# Patient Record
Sex: Male | Born: 1935 | Race: White | Hispanic: No | Smoking: Never smoker
Health system: Southern US, Community
[De-identification: ages and names within clinical notes are randomized; demographics above are authoritative.]

## PROBLEM LIST (undated history)

## (undated) DIAGNOSIS — I10 Essential (primary) hypertension: Secondary | ICD-10-CM

---

## 2005-04-18 ENCOUNTER — Emergency Department: Payer: Self-pay | Admitting: Emergency Medicine

## 2006-06-17 DIAGNOSIS — C4491 Basal cell carcinoma of skin, unspecified: Secondary | ICD-10-CM

## 2006-06-17 HISTORY — DX: Basal cell carcinoma of skin, unspecified: C44.91

## 2008-04-23 DIAGNOSIS — L570 Actinic keratosis: Secondary | ICD-10-CM

## 2008-04-23 HISTORY — DX: Actinic keratosis: L57.0

## 2010-02-17 ENCOUNTER — Ambulatory Visit: Payer: Self-pay | Admitting: Internal Medicine

## 2010-02-27 ENCOUNTER — Ambulatory Visit: Payer: Self-pay | Admitting: Internal Medicine

## 2010-03-06 ENCOUNTER — Ambulatory Visit: Payer: Self-pay | Admitting: Internal Medicine

## 2010-04-06 ENCOUNTER — Ambulatory Visit: Payer: Self-pay | Admitting: Internal Medicine

## 2010-07-04 ENCOUNTER — Ambulatory Visit: Payer: Self-pay | Admitting: Internal Medicine

## 2010-07-06 ENCOUNTER — Ambulatory Visit: Payer: Self-pay | Admitting: Internal Medicine

## 2010-10-03 ENCOUNTER — Ambulatory Visit: Payer: Self-pay | Admitting: Internal Medicine

## 2010-10-06 ENCOUNTER — Ambulatory Visit: Payer: Self-pay | Admitting: Internal Medicine

## 2010-12-19 ENCOUNTER — Ambulatory Visit: Payer: Self-pay | Admitting: Internal Medicine

## 2011-01-06 ENCOUNTER — Ambulatory Visit: Payer: Self-pay | Admitting: Internal Medicine

## 2011-01-14 LAB — CBC CANCER CENTER
Basophil #: 0.1 x10 3/mm (ref 0.0–0.1)
Basophil %: 0.7 %
Eosinophil #: 0.4 x10 3/mm (ref 0.0–0.7)
HGB: 17.1 g/dL (ref 13.0–18.0)
Lymphocyte %: 15.9 %
MCH: 30.3 pg (ref 26.0–34.0)
MCHC: 34.1 g/dL (ref 32.0–36.0)
Monocyte #: 1 x10 3/mm — ABNORMAL HIGH (ref 0.0–0.7)
Neutrophil %: 68.7 %
Platelet: 121 x10 3/mm — ABNORMAL LOW (ref 150–440)
RDW: 16 % — ABNORMAL HIGH (ref 11.5–14.5)

## 2011-02-04 LAB — CBC CANCER CENTER
Basophil %: 0.1 %
Eosinophil #: 0.4 x10 3/mm (ref 0.0–0.7)
Eosinophil %: 2.6 %
HCT: 43.9 % (ref 40.0–52.0)
HGB: 15.6 g/dL (ref 13.0–18.0)
Lymphocyte #: 1.9 x10 3/mm (ref 1.0–3.6)
Lymphocyte %: 14.1 %
MCH: 31.1 pg (ref 26.0–34.0)
MCV: 87 fL (ref 80–100)
Neutrophil %: 77.8 %
RBC: 5.03 10*6/uL (ref 4.40–5.90)
RDW: 15.9 % — ABNORMAL HIGH (ref 11.5–14.5)

## 2011-02-06 ENCOUNTER — Ambulatory Visit: Payer: Self-pay | Admitting: Internal Medicine

## 2011-03-04 LAB — CBC CANCER CENTER
Basophil #: 0.1 x10 3/mm (ref 0.0–0.1)
Basophil %: 0.7 %
Eosinophil #: 0.3 x10 3/mm (ref 0.0–0.7)
HCT: 41.6 % (ref 40.0–52.0)
HGB: 14.6 g/dL (ref 13.0–18.0)
Lymphocyte #: 2.2 x10 3/mm (ref 1.0–3.6)
Lymphocyte %: 20.9 %
MCH: 31.2 pg (ref 26.0–34.0)
MCHC: 35 g/dL (ref 32.0–36.0)
MCV: 89 fL (ref 80–100)
Monocyte #: 0.8 x10 3/mm — ABNORMAL HIGH (ref 0.0–0.7)
Monocyte %: 7.1 %
Neutrophil #: 7.3 x10 3/mm — ABNORMAL HIGH (ref 1.4–6.5)
Neutrophil %: 68.6 %
Platelet: 145 x10 3/mm — ABNORMAL LOW (ref 150–440)
RDW: 16.6 % — ABNORMAL HIGH (ref 11.5–14.5)
WBC: 10.7 x10 3/mm — ABNORMAL HIGH (ref 3.8–10.6)

## 2011-03-06 ENCOUNTER — Ambulatory Visit: Payer: Self-pay | Admitting: Internal Medicine

## 2011-04-29 ENCOUNTER — Ambulatory Visit: Payer: Self-pay | Admitting: Internal Medicine

## 2011-04-29 LAB — CBC CANCER CENTER
Basophil #: 0.1 x10 3/mm (ref 0.0–0.1)
HGB: 13.8 g/dL (ref 13.0–18.0)
Lymphocyte %: 22.4 %
MCH: 31.6 pg (ref 26.0–34.0)
MCV: 90 fL (ref 80–100)
Monocyte %: 5.8 %
Platelet: 142 x10 3/mm — ABNORMAL LOW (ref 150–440)
RBC: 4.37 10*6/uL — ABNORMAL LOW (ref 4.40–5.90)
RDW: 14.5 % (ref 11.5–14.5)
WBC: 11 x10 3/mm — ABNORMAL HIGH (ref 3.8–10.6)

## 2011-05-06 ENCOUNTER — Ambulatory Visit: Payer: Self-pay | Admitting: Internal Medicine

## 2011-07-29 ENCOUNTER — Ambulatory Visit: Payer: Self-pay | Admitting: Internal Medicine

## 2011-07-29 LAB — CBC CANCER CENTER
Basophil #: 0.1 x10 3/mm (ref 0.0–0.1)
Basophil %: 0.8 %
Eosinophil %: 2.8 %
HGB: 13.7 g/dL (ref 13.0–18.0)
Lymphocyte #: 2.7 x10 3/mm (ref 1.0–3.6)
Lymphocyte %: 23.1 %
MCH: 30.6 pg (ref 26.0–34.0)
MCV: 89 fL (ref 80–100)
Monocyte #: 0.8 x10 3/mm (ref 0.2–1.0)
Monocyte %: 7.2 %
Platelet: 141 x10 3/mm — ABNORMAL LOW (ref 150–440)
RBC: 4.47 10*6/uL (ref 4.40–5.90)

## 2011-08-06 ENCOUNTER — Ambulatory Visit: Payer: Self-pay | Admitting: Internal Medicine

## 2011-08-19 LAB — CBC CANCER CENTER
Basophil %: 0.9 %
Eosinophil %: 3.1 %
HCT: 40.2 % (ref 40.0–52.0)
HGB: 14.1 g/dL (ref 13.0–18.0)
MCH: 30.8 pg (ref 26.0–34.0)
MCHC: 34.9 g/dL (ref 32.0–36.0)
MCV: 88 fL (ref 80–100)
Monocyte #: 0.6 x10 3/mm (ref 0.2–1.0)
Monocyte %: 5.7 %
Neutrophil #: 7.1 x10 3/mm — ABNORMAL HIGH (ref 1.4–6.5)
Neutrophil %: 69.8 %

## 2011-08-19 LAB — OCCULT BLOOD X 1 CARD TO LAB, STOOL
Occult Blood, Feces: NEGATIVE
Occult Blood, Feces: NEGATIVE
Occult Blood, Feces: NEGATIVE

## 2011-08-19 LAB — IRON AND TIBC
Iron Bind.Cap.(Total): 357 ug/dL (ref 250–450)
Iron Saturation: 29 %
Iron: 103 ug/dL (ref 65–175)

## 2011-08-19 LAB — FERRITIN: Ferritin (ARMC): 410 ng/mL — ABNORMAL HIGH (ref 8–388)

## 2011-09-06 ENCOUNTER — Ambulatory Visit: Payer: Self-pay | Admitting: Internal Medicine

## 2011-10-28 ENCOUNTER — Ambulatory Visit: Payer: Self-pay | Admitting: Internal Medicine

## 2011-10-28 LAB — CBC CANCER CENTER
Basophil %: 1.1 %
Eosinophil #: 0.4 x10 3/mm (ref 0.0–0.7)
HCT: 39.1 % — ABNORMAL LOW (ref 40.0–52.0)
HGB: 13.3 g/dL (ref 13.0–18.0)
Lymphocyte %: 22.9 %
MCH: 30.6 pg (ref 26.0–34.0)
MCHC: 34 g/dL (ref 32.0–36.0)
Monocyte #: 0.6 x10 3/mm (ref 0.2–1.0)
Neutrophil #: 7.5 x10 3/mm — ABNORMAL HIGH (ref 1.4–6.5)
Neutrophil %: 66.8 %
Platelet: 137 x10 3/mm — ABNORMAL LOW (ref 150–440)
RDW: 15 % — ABNORMAL HIGH (ref 11.5–14.5)
WBC: 11.2 x10 3/mm — ABNORMAL HIGH (ref 3.8–10.6)

## 2011-11-06 ENCOUNTER — Ambulatory Visit: Payer: Self-pay | Admitting: Internal Medicine

## 2012-01-06 ENCOUNTER — Ambulatory Visit: Payer: Self-pay | Admitting: Internal Medicine

## 2012-01-27 LAB — CBC CANCER CENTER
Eosinophil %: 2.9 %
HCT: 39.1 % — ABNORMAL LOW (ref 40.0–52.0)
HGB: 13.9 g/dL (ref 13.0–18.0)
Lymphocyte #: 2.8 x10 3/mm (ref 1.0–3.6)
Lymphocyte %: 22.5 %
MCHC: 35.6 g/dL (ref 32.0–36.0)
Monocyte #: 0.7 x10 3/mm (ref 0.2–1.0)
Monocyte %: 5.5 %
Neutrophil #: 8.5 x10 3/mm — ABNORMAL HIGH (ref 1.4–6.5)
Neutrophil %: 68.1 %
Platelet: 151 x10 3/mm (ref 150–440)
RBC: 4.61 10*6/uL (ref 4.40–5.90)
RDW: 14.7 % — ABNORMAL HIGH (ref 11.5–14.5)

## 2012-02-06 ENCOUNTER — Ambulatory Visit: Payer: Self-pay | Admitting: Internal Medicine

## 2012-05-24 ENCOUNTER — Ambulatory Visit: Payer: Self-pay | Admitting: Internal Medicine

## 2012-05-25 LAB — CBC CANCER CENTER
Basophil #: 0.1 x10 3/mm (ref 0.0–0.1)
Basophil %: 1.1 %
Eosinophil %: 3.3 %
HCT: 35.3 % — ABNORMAL LOW (ref 40.0–52.0)
HGB: 12.7 g/dL — ABNORMAL LOW (ref 13.0–18.0)
Lymphocyte #: 2.6 x10 3/mm (ref 1.0–3.6)
MCV: 83 fL (ref 80–100)
Monocyte #: 0.6 x10 3/mm (ref 0.2–1.0)
Monocyte %: 6.5 %
Neutrophil #: 6.3 x10 3/mm (ref 1.4–6.5)
RBC: 4.28 10*6/uL — ABNORMAL LOW (ref 4.40–5.90)
RDW: 15 % — ABNORMAL HIGH (ref 11.5–14.5)
WBC: 9.9 x10 3/mm (ref 3.8–10.6)

## 2012-06-05 ENCOUNTER — Ambulatory Visit: Payer: Self-pay | Admitting: Internal Medicine

## 2013-02-09 ENCOUNTER — Ambulatory Visit: Payer: Self-pay | Admitting: Internal Medicine

## 2013-02-10 LAB — CBC CANCER CENTER
Basophil #: 0.1 x10 3/mm (ref 0.0–0.1)
Basophil %: 1.1 %
EOS ABS: 0.5 x10 3/mm (ref 0.0–0.7)
Eosinophil %: 4.8 %
HCT: 37.4 % — ABNORMAL LOW (ref 40.0–52.0)
HGB: 13 g/dL (ref 13.0–18.0)
LYMPHS ABS: 2.7 x10 3/mm (ref 1.0–3.6)
Lymphocyte %: 26.7 %
MCH: 29.3 pg (ref 26.0–34.0)
MCHC: 34.7 g/dL (ref 32.0–36.0)
MCV: 85 fL (ref 80–100)
Monocyte #: 0.6 x10 3/mm (ref 0.2–1.0)
Monocyte %: 6.2 %
Neutrophil #: 6.1 x10 3/mm (ref 1.4–6.5)
Neutrophil %: 61.2 %
Platelet: 143 x10 3/mm — ABNORMAL LOW (ref 150–440)
RBC: 4.42 10*6/uL (ref 4.40–5.90)
RDW: 15.1 % — ABNORMAL HIGH (ref 11.5–14.5)
WBC: 9.9 x10 3/mm (ref 3.8–10.6)

## 2013-03-05 ENCOUNTER — Ambulatory Visit: Payer: Self-pay | Admitting: Internal Medicine

## 2013-03-10 LAB — CBC CANCER CENTER
Basophil #: 0.1 x10 3/mm (ref 0.0–0.1)
Basophil %: 0.9 %
Eosinophil #: 0.4 x10 3/mm (ref 0.0–0.7)
Eosinophil %: 3.8 %
HCT: 38.7 % — AB (ref 40.0–52.0)
HGB: 13.2 g/dL (ref 13.0–18.0)
LYMPHS PCT: 23.6 %
Lymphocyte #: 2.4 x10 3/mm (ref 1.0–3.6)
MCH: 29.2 pg (ref 26.0–34.0)
MCHC: 34 g/dL (ref 32.0–36.0)
MCV: 86 fL (ref 80–100)
MONO ABS: 0.6 x10 3/mm (ref 0.2–1.0)
MONOS PCT: 5.5 %
Neutrophil #: 6.8 x10 3/mm — ABNORMAL HIGH (ref 1.4–6.5)
Neutrophil %: 66.2 %
Platelet: 148 x10 3/mm — ABNORMAL LOW (ref 150–440)
RBC: 4.51 10*6/uL (ref 4.40–5.90)
RDW: 15 % — ABNORMAL HIGH (ref 11.5–14.5)
WBC: 10.3 x10 3/mm (ref 3.8–10.6)

## 2013-03-10 LAB — IRON AND TIBC
IRON: 87 ug/dL (ref 65–175)
Iron Bind.Cap.(Total): 369 ug/dL (ref 250–450)
Iron Saturation: 24 %
UNBOUND IRON-BIND. CAP.: 282 ug/dL

## 2013-03-10 LAB — FERRITIN: Ferritin (ARMC): 201 ng/mL (ref 8–388)

## 2013-04-05 ENCOUNTER — Ambulatory Visit: Payer: Self-pay | Admitting: Internal Medicine

## 2013-06-08 ENCOUNTER — Ambulatory Visit: Payer: Self-pay | Admitting: Internal Medicine

## 2013-06-09 LAB — CBC CANCER CENTER
BASOS ABS: 0.1 x10 3/mm (ref 0.0–0.1)
Basophil %: 0.9 %
EOS ABS: 0.4 x10 3/mm (ref 0.0–0.7)
Eosinophil %: 3.6 %
HCT: 36.8 % — AB (ref 40.0–52.0)
HGB: 12.8 g/dL — ABNORMAL LOW (ref 13.0–18.0)
LYMPHS ABS: 2.8 x10 3/mm (ref 1.0–3.6)
Lymphocyte %: 27.4 %
MCH: 29.8 pg (ref 26.0–34.0)
MCHC: 34.8 g/dL (ref 32.0–36.0)
MCV: 86 fL (ref 80–100)
MONOS PCT: 6.5 %
Monocyte #: 0.7 x10 3/mm (ref 0.2–1.0)
NEUTROS PCT: 61.6 %
Neutrophil #: 6.4 x10 3/mm (ref 1.4–6.5)
Platelet: 129 x10 3/mm — ABNORMAL LOW (ref 150–440)
RBC: 4.3 10*6/uL — ABNORMAL LOW (ref 4.40–5.90)
RDW: 14.7 % — AB (ref 11.5–14.5)
WBC: 10.3 x10 3/mm (ref 3.8–10.6)

## 2013-06-15 DIAGNOSIS — I10 Essential (primary) hypertension: Secondary | ICD-10-CM | POA: Insufficient documentation

## 2013-07-05 ENCOUNTER — Ambulatory Visit: Payer: Self-pay | Admitting: Internal Medicine

## 2013-10-06 ENCOUNTER — Ambulatory Visit: Payer: Self-pay | Admitting: Internal Medicine

## 2013-10-06 LAB — CBC CANCER CENTER
Basophil #: 0.1 x10 3/mm (ref 0.0–0.1)
Basophil %: 1.2 %
EOS ABS: 0.4 x10 3/mm (ref 0.0–0.7)
Eosinophil %: 4 %
HCT: 38.8 % — ABNORMAL LOW (ref 40.0–52.0)
HGB: 12.8 g/dL — ABNORMAL LOW (ref 13.0–18.0)
Lymphocyte #: 2.5 x10 3/mm (ref 1.0–3.6)
Lymphocyte %: 24 %
MCH: 29 pg (ref 26.0–34.0)
MCHC: 33.1 g/dL (ref 32.0–36.0)
MCV: 88 fL (ref 80–100)
MONOS PCT: 6.1 %
Monocyte #: 0.6 x10 3/mm (ref 0.2–1.0)
Neutrophil #: 6.7 x10 3/mm — ABNORMAL HIGH (ref 1.4–6.5)
Neutrophil %: 64.7 %
PLATELETS: 145 x10 3/mm — AB (ref 150–440)
RBC: 4.42 10*6/uL (ref 4.40–5.90)
RDW: 14.9 % — ABNORMAL HIGH (ref 11.5–14.5)
WBC: 10.3 x10 3/mm (ref 3.8–10.6)

## 2013-11-05 ENCOUNTER — Ambulatory Visit: Payer: Self-pay | Admitting: Internal Medicine

## 2014-02-09 ENCOUNTER — Ambulatory Visit: Payer: Self-pay | Admitting: Internal Medicine

## 2014-02-09 LAB — CBC CANCER CENTER
BASOS PCT: 1.3 %
Basophil #: 0.1 x10 3/mm (ref 0.0–0.1)
Eosinophil #: 0.3 x10 3/mm (ref 0.0–0.7)
Eosinophil %: 3.8 %
HCT: 38 % — ABNORMAL LOW (ref 40.0–52.0)
HGB: 13 g/dL (ref 13.0–18.0)
LYMPHS ABS: 2.3 x10 3/mm (ref 1.0–3.6)
Lymphocyte %: 24.7 %
MCH: 29.1 pg (ref 26.0–34.0)
MCHC: 34.3 g/dL (ref 32.0–36.0)
MCV: 85 fL (ref 80–100)
MONO ABS: 0.6 x10 3/mm (ref 0.2–1.0)
Monocyte %: 6.1 %
NEUTROS PCT: 64.1 %
Neutrophil #: 5.9 x10 3/mm (ref 1.4–6.5)
Platelet: 149 x10 3/mm — ABNORMAL LOW (ref 150–440)
RBC: 4.48 10*6/uL (ref 4.40–5.90)
RDW: 15.4 % — ABNORMAL HIGH (ref 11.5–14.5)
WBC: 9.3 x10 3/mm (ref 3.8–10.6)

## 2014-02-14 DIAGNOSIS — C4492 Squamous cell carcinoma of skin, unspecified: Secondary | ICD-10-CM

## 2014-02-14 HISTORY — DX: Squamous cell carcinoma of skin, unspecified: C44.92

## 2014-03-06 ENCOUNTER — Ambulatory Visit
Admit: 2014-03-06 | Disposition: A | Discharge status: Home / Self Care | Payer: Self-pay | Attending: Internal Medicine | Admitting: Internal Medicine

## 2014-06-08 ENCOUNTER — Other Ambulatory Visit: Payer: Self-pay

## 2014-06-15 DIAGNOSIS — E785 Hyperlipidemia, unspecified: Secondary | ICD-10-CM | POA: Insufficient documentation

## 2014-10-12 ENCOUNTER — Inpatient Hospital Stay: Payer: Medicare Other | Attending: Family Medicine

## 2015-02-08 ENCOUNTER — Inpatient Hospital Stay: Payer: Medicare Other

## 2015-02-08 ENCOUNTER — Ambulatory Visit: Payer: Self-pay | Admitting: Internal Medicine

## 2015-02-21 ENCOUNTER — Other Ambulatory Visit: Payer: Self-pay | Admitting: *Deleted

## 2015-02-21 DIAGNOSIS — D696 Thrombocytopenia, unspecified: Secondary | ICD-10-CM

## 2015-02-21 DIAGNOSIS — D72829 Elevated white blood cell count, unspecified: Secondary | ICD-10-CM

## 2015-02-22 ENCOUNTER — Inpatient Hospital Stay: Payer: Medicare Other

## 2015-02-22 ENCOUNTER — Inpatient Hospital Stay: Payer: Medicare Other | Attending: Internal Medicine

## 2015-02-28 ENCOUNTER — Telehealth: Payer: Self-pay | Admitting: *Deleted

## 2015-02-28 ENCOUNTER — Encounter: Payer: Self-pay | Admitting: *Deleted

## 2015-02-28 NOTE — Telephone Encounter (Signed)
Pt was last seen in feb 2016. Had lab appt at 4 months, 8 months and see md with labs in 12 months.  None of these appts did pt come to and he was r/s x 2 for his f/u appt.  He was previous gittin patient and was switched to covering provider after gittin left.  Sent letter closing chart to services today and put it in the mail to pt.

## 2018-11-22 ENCOUNTER — Emergency Department: Payer: Medicare Other

## 2018-11-22 ENCOUNTER — Inpatient Hospital Stay
Admission: EM | Admit: 2018-11-22 | Discharge: 2018-11-23 | DRG: 177 | Disposition: A | Discharge status: Other Acute Inpatient Hospital | Payer: Medicare Other | Attending: Family Medicine | Admitting: Family Medicine

## 2018-11-22 ENCOUNTER — Encounter: Payer: Self-pay | Admitting: Emergency Medicine

## 2018-11-22 ENCOUNTER — Other Ambulatory Visit: Payer: Self-pay

## 2018-11-22 DIAGNOSIS — I959 Hypotension, unspecified: Secondary | ICD-10-CM | POA: Diagnosis present

## 2018-11-22 DIAGNOSIS — U071 COVID-19: Secondary | ICD-10-CM | POA: Diagnosis not present

## 2018-11-22 DIAGNOSIS — N179 Acute kidney failure, unspecified: Secondary | ICD-10-CM | POA: Diagnosis present

## 2018-11-22 DIAGNOSIS — Z7982 Long term (current) use of aspirin: Secondary | ICD-10-CM

## 2018-11-22 DIAGNOSIS — J189 Pneumonia, unspecified organism: Secondary | ICD-10-CM | POA: Diagnosis not present

## 2018-11-22 DIAGNOSIS — R531 Weakness: Secondary | ICD-10-CM | POA: Diagnosis not present

## 2018-11-22 DIAGNOSIS — E860 Dehydration: Secondary | ICD-10-CM | POA: Diagnosis present

## 2018-11-22 DIAGNOSIS — J1282 Pneumonia due to coronavirus disease 2019: Secondary | ICD-10-CM | POA: Diagnosis present

## 2018-11-22 DIAGNOSIS — H9190 Unspecified hearing loss, unspecified ear: Secondary | ICD-10-CM | POA: Diagnosis present

## 2018-11-22 DIAGNOSIS — I10 Essential (primary) hypertension: Secondary | ICD-10-CM | POA: Diagnosis present

## 2018-11-22 DIAGNOSIS — I447 Left bundle-branch block, unspecified: Secondary | ICD-10-CM | POA: Diagnosis present

## 2018-11-22 DIAGNOSIS — R7989 Other specified abnormal findings of blood chemistry: Secondary | ICD-10-CM | POA: Diagnosis present

## 2018-11-22 DIAGNOSIS — E785 Hyperlipidemia, unspecified: Secondary | ICD-10-CM | POA: Diagnosis present

## 2018-11-22 DIAGNOSIS — Z79899 Other long term (current) drug therapy: Secondary | ICD-10-CM

## 2018-11-22 DIAGNOSIS — R778 Other specified abnormalities of plasma proteins: Secondary | ICD-10-CM | POA: Diagnosis not present

## 2018-11-22 DIAGNOSIS — G9340 Encephalopathy, unspecified: Secondary | ICD-10-CM | POA: Diagnosis not present

## 2018-11-22 DIAGNOSIS — M8580 Other specified disorders of bone density and structure, unspecified site: Secondary | ICD-10-CM | POA: Diagnosis present

## 2018-11-22 DIAGNOSIS — J1289 Other viral pneumonia: Secondary | ICD-10-CM | POA: Diagnosis present

## 2018-11-22 DIAGNOSIS — Z85828 Personal history of other malignant neoplasm of skin: Secondary | ICD-10-CM

## 2018-11-22 HISTORY — DX: Essential (primary) hypertension: I10

## 2018-11-22 LAB — ABO/RH: ABO/RH(D): O POS

## 2018-11-22 LAB — CK: Total CK: 65 U/L (ref 49–397)

## 2018-11-22 LAB — CBC WITH DIFFERENTIAL/PLATELET
Abs Immature Granulocytes: 0.1 10*3/uL — ABNORMAL HIGH (ref 0.00–0.07)
Basophils Absolute: 0 10*3/uL (ref 0.0–0.1)
Basophils Relative: 0 %
Eosinophils Absolute: 0 10*3/uL (ref 0.0–0.5)
Eosinophils Relative: 0 %
HCT: 40.5 % (ref 39.0–52.0)
Hemoglobin: 14.2 g/dL (ref 13.0–17.0)
Immature Granulocytes: 1 %
Lymphocytes Relative: 8 %
Lymphs Abs: 0.8 10*3/uL (ref 0.7–4.0)
MCH: 28 pg (ref 26.0–34.0)
MCHC: 35.1 g/dL (ref 30.0–36.0)
MCV: 79.7 fL — ABNORMAL LOW (ref 80.0–100.0)
Monocytes Absolute: 0.4 10*3/uL (ref 0.1–1.0)
Monocytes Relative: 3 %
Neutro Abs: 9.1 10*3/uL — ABNORMAL HIGH (ref 1.7–7.7)
Neutrophils Relative %: 88 %
Platelets: 190 10*3/uL (ref 150–400)
RBC: 5.08 MIL/uL (ref 4.22–5.81)
RDW: 13.7 % (ref 11.5–15.5)
WBC: 10.4 10*3/uL (ref 4.0–10.5)
nRBC: 0 % (ref 0.0–0.2)

## 2018-11-22 LAB — LACTIC ACID, PLASMA
Lactic Acid, Venous: 1.3 mmol/L (ref 0.5–1.9)
Lactic Acid, Venous: 1.5 mmol/L (ref 0.5–1.9)

## 2018-11-22 LAB — T4, FREE: Free T4: 1.45 ng/dL — ABNORMAL HIGH (ref 0.61–1.12)

## 2018-11-22 LAB — URINALYSIS, ROUTINE W REFLEX MICROSCOPIC
Bilirubin Urine: NEGATIVE
Glucose, UA: NEGATIVE mg/dL
Ketones, ur: NEGATIVE mg/dL
Leukocytes,Ua: NEGATIVE
Nitrite: NEGATIVE
Protein, ur: 100 mg/dL — AB
Specific Gravity, Urine: 1.015 (ref 1.005–1.030)
pH: 5 (ref 5.0–8.0)

## 2018-11-22 LAB — HEPATIC FUNCTION PANEL
ALT: 11 U/L (ref 0–44)
AST: 31 U/L (ref 15–41)
Albumin: 2.5 g/dL — ABNORMAL LOW (ref 3.5–5.0)
Alkaline Phosphatase: 57 U/L (ref 38–126)
Bilirubin, Direct: 0.6 mg/dL — ABNORMAL HIGH (ref 0.0–0.2)
Indirect Bilirubin: 1 mg/dL — ABNORMAL HIGH (ref 0.3–0.9)
Total Bilirubin: 1.6 mg/dL — ABNORMAL HIGH (ref 0.3–1.2)
Total Protein: 5.8 g/dL — ABNORMAL LOW (ref 6.5–8.1)

## 2018-11-22 LAB — CREATININE, SERUM
Creatinine, Ser: 1.58 mg/dL — ABNORMAL HIGH (ref 0.61–1.24)
GFR calc Af Amer: 46 mL/min — ABNORMAL LOW (ref >60)
GFR calc non Af Amer: 40 mL/min — ABNORMAL LOW (ref >60)

## 2018-11-22 LAB — CBC
HCT: 32.6 % — ABNORMAL LOW (ref 39.0–52.0)
Hemoglobin: 11.5 g/dL — ABNORMAL LOW (ref 13.0–17.0)
MCH: 28.3 pg (ref 26.0–34.0)
MCHC: 35.3 g/dL (ref 30.0–36.0)
MCV: 80.1 fL (ref 80.0–100.0)
Platelets: 165 10*3/uL (ref 150–400)
RBC: 4.07 MIL/uL — ABNORMAL LOW (ref 4.22–5.81)
RDW: 13.8 % (ref 11.5–15.5)
WBC: 10.6 10*3/uL — ABNORMAL HIGH (ref 4.0–10.5)
nRBC: 0 % (ref 0.0–0.2)

## 2018-11-22 LAB — COMPREHENSIVE METABOLIC PANEL
ALT: 11 U/L (ref 0–44)
AST: 37 U/L (ref 15–41)
Albumin: 3.1 g/dL — ABNORMAL LOW (ref 3.5–5.0)
Alkaline Phosphatase: 75 U/L (ref 38–126)
Anion gap: 16 — ABNORMAL HIGH (ref 5–15)
BUN: 73 mg/dL — ABNORMAL HIGH (ref 8–23)
CO2: 23 mmol/L (ref 22–32)
Calcium: 8.7 mg/dL — ABNORMAL LOW (ref 8.9–10.3)
Chloride: 95 mmol/L — ABNORMAL LOW (ref 98–111)
Creatinine, Ser: 1.78 mg/dL — ABNORMAL HIGH (ref 0.61–1.24)
GFR calc Af Amer: 40 mL/min — ABNORMAL LOW (ref >60)
GFR calc non Af Amer: 35 mL/min — ABNORMAL LOW (ref >60)
Glucose, Bld: 133 mg/dL — ABNORMAL HIGH (ref 70–99)
Potassium: 3.6 mmol/L (ref 3.5–5.1)
Sodium: 134 mmol/L — ABNORMAL LOW (ref 135–145)
Total Bilirubin: 2.3 mg/dL — ABNORMAL HIGH (ref 0.3–1.2)
Total Protein: 7.4 g/dL (ref 6.5–8.1)

## 2018-11-22 LAB — ETHANOL: Alcohol, Ethyl (B): <10 mg/dL (ref <10)

## 2018-11-22 LAB — INFLUENZA PANEL BY PCR (TYPE A & B)
Influenza A By PCR: NEGATIVE
Influenza B By PCR: NEGATIVE

## 2018-11-22 LAB — TROPONIN I (HIGH SENSITIVITY)
Troponin I (High Sensitivity): 74 ng/L — ABNORMAL HIGH (ref <18)
Troponin I (High Sensitivity): 123 ng/L (ref <18)

## 2018-11-22 LAB — TSH: TSH: 0.126 u[IU]/mL — ABNORMAL LOW (ref 0.350–4.500)

## 2018-11-22 LAB — MAGNESIUM
Magnesium: 2.1 mg/dL (ref 1.7–2.4)
Magnesium: 1.8 mg/dL (ref 1.7–2.4)

## 2018-11-22 LAB — SARS CORONAVIRUS 2 (TAT 6-24 HRS): SARS Coronavirus 2: POSITIVE — AB

## 2018-11-22 LAB — PHOSPHORUS: Phosphorus: 2.7 mg/dL (ref 2.5–4.6)

## 2018-11-22 MED ORDER — HEPARIN SODIUM (PORCINE) 5000 UNIT/ML IJ SOLN
5000.0000 [IU] | Freq: Three times a day (TID) | INTRAMUSCULAR | Status: DC
  Administered 2018-11-22 – 2018-11-23 (×3): 5000 [IU] via SUBCUTANEOUS
  Filled 2018-11-22 (×3): qty 1

## 2018-11-22 MED ORDER — SODIUM CHLORIDE 0.9 % IV SOLN
500.0000 mg | INTRAVENOUS | Status: DC
  Administered 2018-11-22: 500 mg via INTRAVENOUS
  Filled 2018-11-22: qty 500

## 2018-11-22 MED ORDER — ATORVASTATIN CALCIUM 20 MG PO TABS
20.0000 mg | ORAL_TABLET | Freq: Every day | ORAL | Status: DC
  Administered 2018-11-22 – 2018-11-23 (×2): 20 mg via ORAL
  Filled 2018-11-22 (×2): qty 1

## 2018-11-22 MED ORDER — SODIUM CHLORIDE 0.9 % IV SOLN
100.0000 mg | INTRAVENOUS | Status: DC
  Filled 2018-11-22: qty 20

## 2018-11-22 MED ORDER — ASPIRIN EC 81 MG PO TBEC
81.0000 mg | DELAYED_RELEASE_TABLET | Freq: Every day | ORAL | Status: DC
  Administered 2018-11-22 – 2018-11-23 (×2): 81 mg via ORAL
  Filled 2018-11-22 (×2): qty 1

## 2018-11-22 MED ORDER — SODIUM CHLORIDE 0.9 % IV SOLN
INTRAVENOUS | Status: DC
  Administered 2018-11-22: 23:00:00 via INTRAVENOUS

## 2018-11-22 MED ORDER — SODIUM CHLORIDE 0.9 % IV BOLUS
1000.0000 mL | Freq: Once | INTRAVENOUS | Status: AC
  Administered 2018-11-22: 13:00:00 1000 mL via INTRAVENOUS

## 2018-11-22 MED ORDER — CALCIUM CARBONATE-VITAMIN D 500-200 MG-UNIT PO TABS
1.0000 | ORAL_TABLET | Freq: Every day | ORAL | Status: DC
  Administered 2018-11-22 – 2018-11-23 (×2): 1 via ORAL
  Filled 2018-11-22 (×2): qty 1

## 2018-11-22 MED ORDER — DOXYCYCLINE HYCLATE 100 MG PO TABS
100.0000 mg | ORAL_TABLET | Freq: Once | ORAL | Status: AC
  Administered 2018-11-22: 15:00:00 100 mg via ORAL
  Filled 2018-11-22: qty 1

## 2018-11-22 MED ORDER — SODIUM CHLORIDE 0.9 % IV SOLN
1.0000 g | INTRAVENOUS | Status: DC
  Administered 2018-11-23: 14:00:00 1 g via INTRAVENOUS
  Filled 2018-11-22: qty 10

## 2018-11-22 MED ORDER — LACTATED RINGERS IV SOLN
INTRAVENOUS | Status: DC
  Administered 2018-11-22: 19:00:00 via INTRAVENOUS

## 2018-11-22 MED ORDER — SODIUM CHLORIDE 0.9 % IV SOLN
1.0000 g | Freq: Once | INTRAVENOUS | Status: AC
  Administered 2018-11-22: 15:00:00 1 g via INTRAVENOUS
  Filled 2018-11-22: qty 10

## 2018-11-22 MED ORDER — DEXAMETHASONE SODIUM PHOSPHATE 10 MG/ML IJ SOLN
6.0000 mg | INTRAMUSCULAR | Status: DC
  Administered 2018-11-22: 6 mg via INTRAVENOUS
  Filled 2018-11-22: qty 1

## 2018-11-22 MED ORDER — ACETAMINOPHEN 325 MG PO TABS
ORAL_TABLET | ORAL | Status: AC
  Administered 2018-11-22: 18:00:00
  Filled 2018-11-22: qty 2

## 2018-11-22 MED ORDER — SODIUM CHLORIDE 0.9 % IV SOLN
200.0000 mg | Freq: Once | INTRAVENOUS | Status: AC
  Administered 2018-11-22: 23:00:00 200 mg via INTRAVENOUS
  Filled 2018-11-22: qty 40

## 2018-11-22 MED ORDER — ACETAMINOPHEN 325 MG PO TABS
650.0000 mg | ORAL_TABLET | Freq: Once | ORAL | Status: AC
  Administered 2018-11-22: 18:00:00 650 mg via ORAL

## 2018-11-22 MED ORDER — VITAMIN C 500 MG PO TABS
500.0000 mg | ORAL_TABLET | Freq: Every day | ORAL | Status: DC
  Administered 2018-11-22 – 2018-11-23 (×2): 500 mg via ORAL
  Filled 2018-11-22 (×2): qty 1

## 2018-11-22 MED ORDER — ZINC SULFATE 220 (50 ZN) MG PO CAPS
220.0000 mg | ORAL_CAPSULE | Freq: Every day | ORAL | Status: DC
  Administered 2018-11-22 – 2018-11-23 (×2): 220 mg via ORAL
  Filled 2018-11-22 (×2): qty 1

## 2018-11-22 MED ORDER — IOHEXOL 350 MG/ML SOLN
60.0000 mL | Freq: Once | INTRAVENOUS | Status: AC | PRN
  Administered 2018-11-22: 14:00:00 60 mL via INTRAVENOUS

## 2018-11-22 MED ORDER — IPRATROPIUM BROMIDE HFA 17 MCG/ACT IN AERS
2.0000 | INHALATION_SPRAY | Freq: Four times a day (QID) | RESPIRATORY_TRACT | Status: DC
  Administered 2018-11-22 – 2018-11-23 (×4): 2 via RESPIRATORY_TRACT
  Filled 2018-11-22: qty 12.9

## 2018-11-22 MED ORDER — DOXYCYCLINE HYCLATE 100 MG PO TABS: 100.0000 mg | ORAL_TABLET | Freq: Every day | ORAL | Status: DC

## 2018-11-22 NOTE — Progress Notes (Addendum)
     BRIEF OVERNIGHT PROGRESS REPORT   SUBJECTIVE: Lab report positive COVID 19 positive  OBJECTIVE: Patient examined at the bedside. He is currently afebrile with blood pressure 100/64 mm Hg and pulse rate 82 beats/min. There were no focal neurological deficits; he was alert and oriented x4. He does not appear to be in any respiratory distress.  PHYSICAL EXAM: EYES: Pupils equal, round, reactive to light and accommodation. No scleral icterus. Extraocular muscles intact.  HEENT: Head atraumatic, normocephalic. Oropharynx and nasopharynx clear.  NECK:  Supple, no jugular venous distention. No thyroid enlargement, no tenderness.  LUNGS: Decreased breath sounds bilaterally, no wheezing, rales,rhonchi or crepitation. No use of accessory muscles of respiration.  CARDIOVASCULAR: S1, S2 normal. No murmurs, rubs, or gallops.  ABDOMEN: Soft, nontender, nondistended. Bowel sounds present. No organomegaly or mass.  EXTREMITIES: No pedal edema, cyanosis, or clubbing.  NEUROLOGIC: Cranial nerves II through XII are intact. Muscle strength 5/5 in all extremities. Sensation intact. Gait not checked.  PSYCHIATRIC: The patient is alert and oriented x 3.  SKIN: Discoloration of lower extremitied  ASSESSMENT: Rihan Thomsen. Momon 85 y.o male with pertinent hx of HTN, HLD, Basal cell carcinoma and osteopenia presenting with c/o generalized weakness, dizziness/lightheadedness with near syncope upon standing and decreased poor po intake x 3 days. Found with urinary incontinence upon EMS arrival.  PLAN: 1. Multifocal Pneumonia secondary to COVID-19 infection - Patient presenting with fever of 102, mild leukocytosis, generalized weakness, tachycardia, and hypotension. - CT chest shows Multifocal areas of ground-glass attenuation with an appearance consistent with underlying infection or developing consolidation  - Transfer to [facility] when bed becomes available - Check procalcitonin, Inflammatory markers - Urine cultures  pending - Monitor fever curve - Blood cultures pending - Start Empiric abx with Ceftriaxone and Azithromycin - Start Remdesivir - Start Decadron 6 mg Q 24 hours - Inhalers with flutter valve - Daily CRP, CMP, CBC, D-dimer - Echocardiogram pending - Continuous pulse oximetry - Vitamins (Zinc and Vitamin C) - Gentle IVFs to keep MAP<65mmHg or SBP <5mmHg  2. Acute Kidney Injury - Likely prerenal in the setting of dehydration from poor po intake *- Hold Nephrotoxins - IVFs hydration  - Continue to monitor renal functions  3. Elevated troponin - Likely demand ischemia - Continue trend troponin  4. HTN  + Goal BP <130/80 - Hold HCTZ due to hypotension and AKI  5. HLD + Goal LDL<100 - Atorvastatin 20mg  PO qhs  6. DVT prophylaxis-  Heparin SubQ     Rufina Falco, DNP, CCRN, FNP-C [facility] Nurse Practitioner Between 7pm to [place] - Pager [phone]  After 7am go to [email or web address] - password:[email or web address]  [facility] [employer]  [phone]

## 2018-11-22 NOTE — ED Notes (Signed)
Pt given meal tray.

## 2018-11-22 NOTE — Progress Notes (Signed)
Remdesivir - Pharmacy Brief Note   O:  ALT: 11 CXR: Patient does have multifocal areas of groundglass attenuation that could be concerning for possible infection.  Will cover patient for community-acquired pneumonia SpO2: 97% on RA   A/P:  Remdesivir 200 mg IVPB once followed by 100 mg IVPB daily x 4 days.   Tobie Lords, PharmD, BCPS Clinical Pharmacist 11/22/2018 11:09 PM

## 2018-11-22 NOTE — ED Notes (Signed)
Yellow socks placed on pt's feet at this time

## 2018-11-22 NOTE — H&P (Addendum)
History and Physical    Albert [name] [id number] DOB: 1933-02-03 DOA: 11/22/2018  PCP: Patient, No Pcp Per (Confirm with patient/family/NH records and if not entered, this has to be entered at [facility] point of entry) Patient coming from: Home  Chief Complaint: Weakness  HPI: Albert [name] is a 85 y.o. male with medical history significant of htn, seen in Ed for weakness . Pt reports that for past 3 days or so he has been sob and very weak has not been able to get out of bed to even get a drink of water.  Patient is a poor historian otherwise history is per ED MD note and EM patient timeline track.  He was found incontinent in bed unable to move.  He did report some dizziness.  Currently denies any complaints.  He is alert awake and oriented to time place and person. ED Course:  Blood pressure 102/63, pulse (!) 101, temperature 99 F (37.2 C), temperature source Oral, resp. rate 16, height 5\' 9"  (1.753 m), weight 61.2 kg, SpO2 95 %. In the emergency room patient had a head CT which was negative for any acute findings and a CTA of the chest done showed multifocal groundglass attenuation areas concerning for infection which I suspect is because of Covid infection.  Initial labs do show acute kidney injury with a creatinine of 1.78.  Patient found to have a troponin elevation of 74 which I suspect is from the acute kidney injury.  I will also check a CPK and a lactic acid in the patient.  The anion gap is elevated to 16.  Review of Systems: As per HPI otherwise 10 point review of systems negative.    Past Medical History:  Diagnosis Date  . Hypertension      has no history on file for tobacco, alcohol, and drug.  No Known Allergies  No family history on file.   Prior to Admission medications   Medication Sig Start Date End Date Taking? Authorizing Provider  aspirin EC 81 MG tablet Take 81 mg by mouth daily.   Yes [provider]  atorvastatin (LIPITOR) 20 MG tablet Take 20  mg by mouth daily. 11/20/18  Yes [provider]  calcium-vitamin D (CALCIUM 500+D HIGH POTENCY) 500-400 MG-UNIT tablet Take 1 tablet by mouth daily.   Yes [provider]  celecoxib (CELEBREX) 200 MG capsule Take 200 mg by mouth daily. 11/20/18  Yes [provider]  hydrochlorothiazide (HYDRODIURIL) 25 MG tablet Take 25 mg by mouth daily. 09/17/18  Yes [provider]    Physical Exam: Vitals:   11/22/18 1500 11/22/18 1515 11/22/18 1600 11/22/18 1700  BP:    102/63  Pulse: (!) 107 (!) 104 (!) 102 (!) 101  Resp: (!) 22 16 (!) 33 16  Temp:      TempSrc:      SpO2: 96% 94% 95% 95%  Weight:      Height:        Constitutional: NAD, calm, comfortable Vitals:   11/22/18 1500 11/22/18 1515 11/22/18 1600 11/22/18 1700  BP:    102/63  Pulse: (!) 107 (!) 104 (!) 102 (!) 101  Resp: (!) 22 16 (!) 33 16  Temp:      TempSrc:      SpO2: 96% 94% 95% 95%  Weight:      Height:       Eyes: PERRL, lids and conjunctivae normal ENMT: Mucous membranes are moist. Posterior pharynx clear of any exudate or  lesions.Normal dentition.  Neck: normal, supple, no masses, no thyromegaly Respiratory: clear to auscultation bilaterally, no wheezing, no crackles. Normal respiratory effort. No accessory muscle use.  Cardiovascular: Regular rate and rhythm, no murmurs / rubs / gallops. No extremity edema. 2+ pedal pulses. No carotid bruits.  Abdomen: no tenderness, no masses palpated. No hepatosplenomegaly. Bowel sounds positive.  Musculoskeletal: no clubbing / cyanosis. No joint deformity upper and lower extremities. Good ROM, no contractures. Normal muscle tone.  Skin: no rashes, lesions, ulcers. No induration Neurologic: CN 2-12 grossly intact. Sensation intact, DTR normal. Strength 5/5 in all 4.  Psychiatric: Normal judgment and insight. Alert and oriented x 3. Normal mood.   Labs on Admission: I have personally reviewed following labs and imaging studies  CBC: Recent  Labs  Lab 11/22/18 1251  WBC 10.4  NEUTROABS 9.1*  HGB 14.2  HCT 40.5  MCV 79.7*  PLT [id number]   Basic Metabolic Panel: Recent Labs  Lab 11/22/18 1251  NA 134*  K 3.6  CL 95*  CO2 23  GLUCOSE 133*  BUN 73*  CREATININE 1.78*  CALCIUM 8.7*  MG 2.1   GFR: Estimated Creatinine Clearance: 27.2 mL/min (A) (by C-G formula based on SCr of 1.78 mg/dL (H)). Liver Function Tests: Recent Labs  Lab 11/22/18 1251  AST 37  ALT 11  ALKPHOS 75  BILITOT 2.3*  PROT 7.4  ALBUMIN 3.1*   No results for input(s): LIPASE, AMYLASE in the last 168 hours. No results for input(s): AMMONIA in the last 168 hours. Coagulation Profile: No results for input(s): INR, PROTIME in the last 168 hours. Cardiac Enzymes: Recent Labs  Lab 11/22/18 1251  CKTOTAL 65   BNP (last 3 results) No results for input(s): PROBNP in the last 8760 hours. HbA1C: No results for input(s): HGBA1C in the last 72 hours. CBG: No results for input(s): GLUCAP in the last 168 hours. Lipid Profile: No results for input(s): CHOL, HDL, LDLCALC, TRIG, CHOLHDL, LDLDIRECT in the last 72 hours. Thyroid Function Tests: No results for input(s): TSH, T4TOTAL, FREET4, T3FREE, THYROIDAB in the last 72 hours. Anemia Panel: No results for input(s): VITAMINB12, FOLATE, FERRITIN, TIBC, IRON, RETICCTPCT in the last 72 hours. Urine analysis:    Component Value Date/Time   COLORURINE YELLOW (A) 11/22/2018 1251   APPEARANCEUR HAZY (A) 11/22/2018 1251   LABSPEC 1.015 11/22/2018 1251   PHURINE 5.0 11/22/2018 1251   GLUCOSEU NEGATIVE 11/22/2018 1251   HGBUR SMALL (A) 11/22/2018 1251   BILIRUBINUR NEGATIVE 11/22/2018 1251   KETONESUR NEGATIVE 11/22/2018 1251   PROTEINUR 100 (A) 11/22/2018 1251   NITRITE NEGATIVE 11/22/2018 1251   LEUKOCYTESUR NEGATIVE 11/22/2018 1251    Radiological Exams on Admission: Ct Head Wo Contrast  Result Date: 11/22/2018 CLINICAL DATA:  Unexplained altered level of consciousness. Weakness and dizziness.  Duration of symptoms 3 days. EXAM: CT HEAD WITHOUT CONTRAST TECHNIQUE: Contiguous axial images were obtained from the base of the skull through the vertex without intravenous contrast. COMPARISON:  None. FINDINGS: Brain: Age related volume loss. Mild chronic appearing small vessel change of the hemispheric white matter. No sign of acute infarction, mass lesion, hemorrhage, hydrocephalus or extra-axial collection. Vascular: There is atherosclerotic calcification of the major vessels at the base of the brain. Skull: Negative Sinuses/Orbits: Clear/normal Other: None IMPRESSION: No acute finding. Ordinary mild age related volume loss and small-vessel change of the white matter. Electronically Signed   By: Nelson Chimes M.D.   On: 11/22/2018 13:53   Ct Angio Chest Pe W And/or  Wo Contrast  Addendum Date: 11/22/2018   ADDENDUM REPORT: 11/22/2018 16:54 ADDENDUM: Ascending thoracic aortic dilatation to 4.1 cm. Recommend annual imaging followup by CTA or MRA. This recommendation follows 2010 ACCF/AHA/AATS/ACR/ASA/SCA/SCAI/SIR/STS/SVM Guidelines for the Diagnosis and Management of Patients with Thoracic Aortic Disease. Circulation. 2010; [id number]. Aortic aneurysm NOS (ICD10-I71.9) This addendum was called by telephone at the time of dictation on 11/22/2018 at 4:53 pm to provider Dr. Alford Highland, Who verbally acknowledged these results. Electronically Signed   By: Lovena Le M.D.   On: 11/22/2018 16:54   Result Date: 11/22/2018 CLINICAL DATA:  Chest pain, complex, high probability of ACS/PE/acute aortic syndrome EXAM: CT ANGIOGRAPHY CHEST WITH CONTRAST TECHNIQUE: Multidetector CT imaging of the chest was performed using the standard protocol during bolus administration of intravenous contrast. Multiplanar CT image reconstructions and MIPs were obtained to evaluate the vascular anatomy. CONTRAST:  63mL OMNIPAQUE IOHEXOL 350 MG/ML SOLN COMPARISON:  Chest radiograph 11/22/2018 FINDINGS: Cardiovascular: Motion artifact  limits evaluation of the pulmonary arteries beyond the the lobar level. No central or lobar filling defects are seen. Central pulmonary arteries are normal caliber. Mild cardiomegaly. Extensive coronary artery calcifications are noted. Dense mitral annular calcification is seen as well. No pericardial effusion. Borderline dilation of the ascending thoracic aorta 4.1 cm. Atherosclerotic calcifications are present throughout the thoracic aorta and proximal great vessels. Normal 3 vessel branching of the aortic arch. Mediastinum/Nodes: Scattered low-attenuation borderline enlarged mediastinal and hilar nodes are present including a 11 mm subcarinal lymph node (4/43) and 11 mm right hilar lymph node (4/42). No acute abnormality of the trachea. Posterior bowing compatible with imaging during exhalation. Slightly heterogeneous appearance of the thyroid gland with few punctate calcifications in several hypoattenuating nodules with slight enlargement of the left lobe. Small hiatal hernia. No acute esophageal abnormality. Lungs/Pleura: There are multifocal areas of ground-glass attenuation with an appearance conspicuous for underlying infection or developing consolidation superimposed on areas of atelectasis. There is diffuse airways thickening. No suspicious nodules or masses. No pneumothorax or effusion. Upper Abdomen: No acute abnormalities present in the visualized portions of the upper abdomen. Punctate benign-appearing calcification head Musculoskeletal: Exaggeration of the normal thoracic kyphosis with multilevel bridging syndesmophytes across the spine suggesting some degree of ankylosis. No acute or suspicious osseous lesions are seen. There is an increased AP diameter of the chest associated with the exaggerated curvature. Additional discogenic changes are present in the cervical spine as well. Review of the MIP images confirms the above findings. IMPRESSION: 1. Motion artifact limits evaluation of the pulmonary  arteries beyond the lobar level. No central or lobar filling defects are seen. 2. Multifocal areas of ground-glass attenuation with an appearance conspicuous for underlying infection or developing consolidation superimposed on areas of atelectasis. 3. Scattered borderline enlarged mediastinal and hilar nodes, nonspecific, but likely reactive. 4. Exaggerated thoracic kyphosis and bridging syndesmophyte formation conspicuous for ankylosing spondylitis. 5. Aortic Atherosclerosis (ICD10-I70.0). Electronically Signed: By: Lovena Le M.D. On: 11/22/2018 14:46   Dg Chest Portable 1 View  Result Date: 11/22/2018 CLINICAL DATA:  Shortness of breath EXAM: PORTABLE CHEST 1 VIEW COMPARISON:  None. FINDINGS: There is apparent scarring in the right mid and lower lung zones. There is bibasilar atelectasis. There is no edema or consolidation. Heart size and pulmonary vascularity are normal. No adenopathy. There is aortic atherosclerosis. There is degenerative change in the thoracic spine. IMPRESSION: No frank edema or consolidation. Bibasilar atelectasis with apparent scarring in the right mid lower lung zones. Heart size within normal limits. No adenopathy. Aortic  Atherosclerosis (ICD10-I70.0). Electronically Signed   By: Lowella Grip III M.D.   On: 11/22/2018 13:24    EKG: Independently reviewed.  EKG shows sinus tachycardia rate of 109 with a left bundle branch block .  Assessment/Plan Principal Problem:   Generalized weakness Active Problems:   AKI (acute kidney injury) (HCC)   Elevated troponin   Generalized weakness: Patient has been progressively becoming very weak and deconditioned.  During the exam today patient was not able to sit himself up in bed.  We will need physical therapy consult we will evaluate patient for possible rhabdo, and attribute his weakness to his illness as I suspect in his case is COVID-19 related.  We will also check for thyroid  function test, cycle cardiac enzymes.  Mid  patient to telemetry unit.  We will continue pt on rocephin and doxycycline for CAP.  Acute kidney injury: Attributed to patient being dehydrated over the past 3 days and inability to move or take care of himself.  Patient does live alone.  We will continue patient on LR at 50 cc an hour for gentle and cautious hydration in case the patient does have coronavirus infection.   Elevated troponin: Attributed to acute kidney injury.  Cycle cardiac enzymes. He does have new LBBB and will obtain 2 d echo for any wm abnormality.   Elevated anion gap: Attribute to AKI, will check patient's lactic acid level.  DVT prophylaxis: Heparin (Lovenox/Heparin/SCD's/anticoagulated/None (if comfort care) Code Status: Full code (Full/Partial (specify details) Family Communication: None at bedside (Specify name, relationship. Do not write "discussed with patient". Specify tel # if discussed over the phone) Disposition Plan: Discharge in 4 to 5 days (specify when and where you expect patient to be discharged) Consults called: None (with names) Admission status: Inpatient admission to telemetry floor.  (inpatient / obs / tele / medical floor / SDU)   Para Skeans MD [facility] If 7PM-7AM, please contact night-coverage [email or web address] Password [facility]  11/22/2018, 5:51 PM

## 2018-11-22 NOTE — ED Provider Notes (Addendum)
[facility] Emergency Department Provider Note  ____________________________________________   First MD Initiated Contact with Patient 11/22/18 1250     (approximate)  I have reviewed the triage vital signs and the nursing notes.   HISTORY  Chief Complaint Weakness    HPI Albert [name] is a 85 y.o. male with hypertension who comes in for weakness.  Patient had about 1 month of generalized weakness.  He lives at home by himself has been unable to get a bed.  He was found with urinary incontinence.  He says that his weakness is more so secondary to feeling lightheaded like he is going to pass out.  No recent falls.  Weakness is generalized, constant, nothing makes it better, worse with trying to ambulate.          Past Medical History:  Diagnosis Date  . Hypertension     There are no active problems to display for this patient.   Prior to Admission medications   Not on File    Allergies Patient has no known allergies.  No family history on file.  Social History Denies alcohol or drug use.  Lives at home by himself.   Review of Systems Constitutional: No fever/chills, lightheadedness Eyes: No visual changes. ENT: No sore throat. Cardiovascular: Denies chest pain. Respiratory: Denies shortness of breath. Gastrointestinal: No abdominal pain.  No nausea, no vomiting.  No diarrhea.  No constipation. Genitourinary: Negative for dysuria. Musculoskeletal: Negative for back pain. Skin: Negative for rash. Neurological: Negative for headaches, focal weakness or numbness.  Generalized weakness. All other ROS negative ____________________________________________   PHYSICAL EXAM:  VITAL SIGNS: ED Triage Vitals  Enc Vitals Group     BP 11/22/18 1247 115/76     Pulse Rate 11/22/18 1248 (!) 112     Resp --      Temp 11/22/18 1248 99 F (37.2 C)     Temp Source 11/22/18 1248 Oral     SpO2 11/22/18 1248 97 %     Weight 11/22/18 1248  135 lb (61.2 kg)     Height 11/22/18 1248 5\' 9"  (1.753 m)     Head Circumference --      Peak Flow --      Pain Score 11/22/18 1248 0     Pain Loc --      Pain Edu? --      Excl. in [place]? --     Constitutional: Alert and oriented. Well appearing and in no acute distress.  Hard of hearing Eyes: Conjunctivae are normal. EOMI. Head: Atraumatic. Nose: No congestion/rhinnorhea. Mouth/Throat: Mucous membranes are dry Neck: No stridor. Trachea Midline. FROM Cardiovascular: Tachycardic regular rhythm. Grossly normal heart sounds.  Good peripheral circulation. Respiratory: Normal respiratory effort.  No retractions. Lungs CTAB. Gastrointestinal: Soft and nontender. No distention. No abdominal bruits.  Musculoskeletal: No lower extremity tenderness nor edema.  No joint effusions. Neurologic:  Normal speech and language. No gross focal neurologic deficits are appreciated.  Equal strength in his arms and legs.  No obvious deficits. Skin:  Skin is warm, dry and intact. No rash noted. Psychiatric: Mood and affect are normal. Speech and behavior are normal. GU: Deferred   ____________________________________________   LABS (all labs ordered are listed, but only abnormal results are displayed)  Labs Reviewed  CBC WITH DIFFERENTIAL/PLATELET - Abnormal; Notable for the following components:      Result Value   MCV 79.7 (*)    Neutro Abs 9.1 (*)    Abs Immature  Granulocytes 0.10 (*)    All other components within normal limits  URINALYSIS, ROUTINE W REFLEX MICROSCOPIC - Abnormal; Notable for the following components:   Color, Urine YELLOW (*)    APPearance HAZY (*)    Hgb urine dipstick SMALL (*)    Protein, ur 100 (*)    Bacteria, UA RARE (*)    All other components within normal limits  COMPREHENSIVE METABOLIC PANEL  CK  MAGNESIUM  ETHANOL  TROPONIN I (HIGH SENSITIVITY)   ____________________________________________   ED ECG REPORT I, Vanessa [place], the attending physician,  personally viewed and interpreted this ECG.  EKG is sinus tachycardia rate of 109, left bundle branch block, T wave inversion in 2, aVL, V4 through V6, no ST elevation.  Left bundle branch block appears new from prior ____________________________________________  RADIOLOGY I, Vanessa [place], personally viewed and evaluated these images (plain radiographs) as part of my medical decision making, as well as reviewing the written report by the radiologist  ED MD interpretation: Chest x-ray no evidence of pneumonia  Official radiology report(s): Dg Chest Portable 1 View  Result Date: 11/22/2018 CLINICAL DATA:  Shortness of breath EXAM: PORTABLE CHEST 1 VIEW COMPARISON:  None. FINDINGS: There is apparent scarring in the right mid and lower lung zones. There is bibasilar atelectasis. There is no edema or consolidation. Heart size and pulmonary vascularity are normal. No adenopathy. There is aortic atherosclerosis. There is degenerative change in the thoracic spine. IMPRESSION: No frank edema or consolidation. Bibasilar atelectasis with apparent scarring in the right mid lower lung zones. Heart size within normal limits. No adenopathy. Aortic Atherosclerosis (ICD10-I70.0). Electronically Signed   By: Lowella Grip III M.D.   On: 11/22/2018 13:24    ____________________________________________   PROCEDURES  Procedure(s) performed (including Critical Care):  .Critical Care Performed by: Vanessa [place], MD Authorized by: Vanessa [place], MD   Critical care provider statement:    Critical care time (minutes):  30   Critical care was necessary to treat or prevent imminent or life-threatening deterioration of the following conditions: PNA requiring 2 antibiotics. AKI requiring fluid.    Critical care was time spent personally by me on the following activities:  Discussions with consultants, evaluation of patient's response to treatment, examination of patient, ordering and performing treatments and  interventions, ordering and review of laboratory studies, ordering and review of radiographic studies, pulse oximetry, re-evaluation of patient's condition, obtaining history from patient or surrogate and review of old charts     ____________________________________________   INITIAL IMPRESSION / [place] / ED COURSE  ABUBAKR JAMERSON was evaluated in Emergency Department on 11/22/2018 for the symptoms described in the history of present illness. He was evaluated in the context of the global COVID-19 pandemic, which necessitated consideration that the patient might be at risk for infection with the SARS-CoV-2 virus that causes COVID-19. Institutional protocols and algorithms that pertain to the evaluation of patients at risk for COVID-19 are in a state of rapid change based on information released by regulatory bodies including the CDC and federal and state organizations. These policies and algorithms were followed during the patient's care in the ED.    Patient is an 85 year old gentleman who lives at home by himself and presents with generalized weakness and unable to get a bed.  Patient is tachycardic.  Will get labs evaluate for AKI, electrolyte abnormalities, rhabdo.  Will get urine to evaluate for UTI.  Will get chest x-ray to evaluate for pneumonia and  CT head to evaluate for intracranial hemorrhage.  No obvious focal deficits to suggest  Stroke  Labs are reassuring with no evidence of anemia or white count elevation.  His urine is concerning for some dehydration with significant protein elevation of 100.  Patient's labs are notable for elevated creatinine with significant BUN elevation.  Patient getting fluids in case this is prerenal which I suspect given his elevated anion gap and protein in the urine.  Patient troponin was slightly elevated.  We rediscussed with patient he has been having shortness of breath for 2 to 3 weeks and his apple watch has been reading tachycardia.  Will  get CT PE to ensure there is no evidence of PEs.  Otherwise patient will need to be admitted to the hospital.  CT scan no evidence of PE.  Patient does have multifocal areas of groundglass attenuation that could be concerning for possible infection.  Will cover patient for community-acquired pneumonia.  We will also put on contact precautions and droplet precautions while waiting coronavirus test.  Patient not meet sepsis criteria so we will hold off on blood cultures and lactate.  We discussed with the hospital team for admission.    ____________________________________________   FINAL CLINICAL IMPRESSION(S) / ED DIAGNOSES   Final diagnoses:  AKI (acute kidney injury) ([place])  Elevated troponin  Pneumonia due to infectious organism, unspecified laterality, unspecified part of lung      MEDICATIONS GIVEN DURING THIS VISIT:  Medications  cefTRIAXone (ROCEPHIN) 1 g in sodium chloride 0.9 % 100 mL IVPB (has no administration in time range)  doxycycline (VIBRA-TABS) tablet 100 mg (has no administration in time range)  sodium chloride 0.9 % bolus 1,000 mL (1,000 mLs Intravenous New Bag/Given 11/22/18 1316)  iohexol (OMNIPAQUE) 350 MG/ML injection 60 mL (60 mLs Intravenous Contrast Given 11/22/18 1420)     ED Discharge Orders    None       Note:  This document was prepared using Dragon voice recognition software and may include unintentional dictation errors.   Vanessa [place], MD 11/22/18 1515    Vanessa [place], MD 12/05/18 [phone]

## 2018-11-22 NOTE — ED Notes (Signed)
Pt checked and is dry at this time.

## 2018-11-22 NOTE — ED Triage Notes (Signed)
Pt presents via [facility] with c/o weakness that has been ongoing for 3 days. Pt states he has also been getting dizzy with standing. Negative orthostatics for ems. Pt denies fevers at home. Hx of hypertension. Pt currently alert and oriented x4.

## 2018-11-22 NOTE — ED Notes (Signed)
Called Pharmacy and requested pt's medications at this time

## 2018-11-22 NOTE — ED Notes (Signed)
Pt ate almost entire meal tray. Pt stated that is the most he has eaten in a long time. Pt now resting comfortably at this time

## 2018-11-22 NOTE — ED Notes (Signed)
Called lab and they will add on blood test to blood already in lab. Will send down Red and SST for last two tests to be completed.

## 2018-11-22 NOTE — ED Notes (Signed)
Pt had urinated on self. Pt cleaned and repositioned in bed. Pt temp checked and noted to be elevated at 102. Admitting MD notified and orders placed. Blood cultures x2 collected and sent to lab per standing order.

## 2018-11-22 NOTE — ED Notes (Addendum)
Pt resting at this time.

## 2018-11-22 NOTE — ED Notes (Signed)
Called Pharmacy and spoke with Eustaquio Maize they will send up pt's medications shortly.

## 2018-11-23 ENCOUNTER — Encounter (HOSPITAL_COMMUNITY): Payer: Self-pay

## 2018-11-23 ENCOUNTER — Other Ambulatory Visit: Payer: Self-pay

## 2018-11-23 ENCOUNTER — Inpatient Hospital Stay (HOSPITAL_COMMUNITY)
Admission: AD | Admit: 2018-11-23 | Discharge: 2018-12-01 | DRG: 177 | Disposition: A | Discharge status: Skilled Nursing Facility | Payer: Medicare Other | Source: Other Acute Inpatient Hospital | Attending: Internal Medicine | Admitting: Internal Medicine

## 2018-11-23 DIAGNOSIS — I447 Left bundle-branch block, unspecified: Secondary | ICD-10-CM | POA: Diagnosis present

## 2018-11-23 DIAGNOSIS — J1289 Other viral pneumonia: Secondary | ICD-10-CM | POA: Diagnosis present

## 2018-11-23 DIAGNOSIS — E860 Dehydration: Secondary | ICD-10-CM | POA: Diagnosis present

## 2018-11-23 DIAGNOSIS — I10 Essential (primary) hypertension: Secondary | ICD-10-CM | POA: Diagnosis present

## 2018-11-23 DIAGNOSIS — I959 Hypotension, unspecified: Secondary | ICD-10-CM | POA: Diagnosis present

## 2018-11-23 DIAGNOSIS — Z7982 Long term (current) use of aspirin: Secondary | ICD-10-CM | POA: Diagnosis not present

## 2018-11-23 DIAGNOSIS — R531 Weakness: Secondary | ICD-10-CM | POA: Diagnosis present

## 2018-11-23 DIAGNOSIS — K5900 Constipation, unspecified: Secondary | ICD-10-CM | POA: Diagnosis present

## 2018-11-23 DIAGNOSIS — N183 Chronic kidney disease, stage 3 unspecified: Secondary | ICD-10-CM | POA: Diagnosis present

## 2018-11-23 DIAGNOSIS — I129 Hypertensive chronic kidney disease with stage 1 through stage 4 chronic kidney disease, or unspecified chronic kidney disease: Secondary | ICD-10-CM | POA: Diagnosis present

## 2018-11-23 DIAGNOSIS — M8580 Other specified disorders of bone density and structure, unspecified site: Secondary | ICD-10-CM | POA: Diagnosis present

## 2018-11-23 DIAGNOSIS — E785 Hyperlipidemia, unspecified: Secondary | ICD-10-CM | POA: Diagnosis present

## 2018-11-23 DIAGNOSIS — J1282 Pneumonia due to coronavirus disease 2019: Secondary | ICD-10-CM | POA: Diagnosis present

## 2018-11-23 DIAGNOSIS — N179 Acute kidney failure, unspecified: Secondary | ICD-10-CM | POA: Diagnosis present

## 2018-11-23 DIAGNOSIS — R778 Other specified abnormalities of plasma proteins: Secondary | ICD-10-CM | POA: Diagnosis not present

## 2018-11-23 DIAGNOSIS — Z79899 Other long term (current) drug therapy: Secondary | ICD-10-CM

## 2018-11-23 DIAGNOSIS — Z85828 Personal history of other malignant neoplasm of skin: Secondary | ICD-10-CM | POA: Diagnosis not present

## 2018-11-23 DIAGNOSIS — U071 COVID-19: Secondary | ICD-10-CM | POA: Diagnosis present

## 2018-11-23 DIAGNOSIS — N1831 Chronic kidney disease, stage 3a: Secondary | ICD-10-CM | POA: Diagnosis not present

## 2018-11-23 DIAGNOSIS — H9190 Unspecified hearing loss, unspecified ear: Secondary | ICD-10-CM | POA: Diagnosis present

## 2018-11-23 LAB — CBC WITH DIFFERENTIAL/PLATELET
Abs Immature Granulocytes: 0.09 10*3/uL — ABNORMAL HIGH (ref 0.00–0.07)
Basophils Absolute: 0 10*3/uL (ref 0.0–0.1)
Basophils Relative: 0 %
Eosinophils Absolute: 0 10*3/uL (ref 0.0–0.5)
Eosinophils Relative: 0 %
HCT: 31.3 % — ABNORMAL LOW (ref 39.0–52.0)
Hemoglobin: 10.9 g/dL — ABNORMAL LOW (ref 13.0–17.0)
Immature Granulocytes: 1 %
Lymphocytes Relative: 7 %
Lymphs Abs: 0.6 10*3/uL — ABNORMAL LOW (ref 0.7–4.0)
MCH: 28.2 pg (ref 26.0–34.0)
MCHC: 34.8 g/dL (ref 30.0–36.0)
MCV: 81.1 fL (ref 80.0–100.0)
Monocytes Absolute: 0.1 10*3/uL (ref 0.1–1.0)
Monocytes Relative: 2 %
Neutro Abs: 7.3 10*3/uL (ref 1.7–7.7)
Neutrophils Relative %: 90 %
Platelets: 145 10*3/uL — ABNORMAL LOW (ref 150–400)
RBC: 3.86 MIL/uL — ABNORMAL LOW (ref 4.22–5.81)
RDW: 14.1 % (ref 11.5–15.5)
WBC: 8.1 10*3/uL (ref 4.0–10.5)
nRBC: 0 % (ref 0.0–0.2)

## 2018-11-23 LAB — COMPREHENSIVE METABOLIC PANEL
ALT: 9 U/L (ref 0–44)
AST: 35 U/L (ref 15–41)
Albumin: 2.4 g/dL — ABNORMAL LOW (ref 3.5–5.0)
Alkaline Phosphatase: 57 U/L (ref 38–126)
Anion gap: 17 — ABNORMAL HIGH (ref 5–15)
BUN: 73 mg/dL — ABNORMAL HIGH (ref 8–23)
CO2: 22 mmol/L (ref 22–32)
Calcium: 8 mg/dL — ABNORMAL LOW (ref 8.9–10.3)
Chloride: 96 mmol/L — ABNORMAL LOW (ref 98–111)
Creatinine, Ser: 1.61 mg/dL — ABNORMAL HIGH (ref 0.61–1.24)
GFR calc Af Amer: 45 mL/min — ABNORMAL LOW (ref >60)
GFR calc non Af Amer: 39 mL/min — ABNORMAL LOW (ref >60)
Glucose, Bld: 152 mg/dL — ABNORMAL HIGH (ref 70–99)
Potassium: 3.4 mmol/L — ABNORMAL LOW (ref 3.5–5.1)
Sodium: 135 mmol/L (ref 135–145)
Total Bilirubin: 1.2 mg/dL (ref 0.3–1.2)
Total Protein: 6 g/dL — ABNORMAL LOW (ref 6.5–8.1)

## 2018-11-23 LAB — C-REACTIVE PROTEIN
CRP: 17.1 mg/dL — ABNORMAL HIGH (ref <1.0)
CRP: 16.7 mg/dL — ABNORMAL HIGH (ref <1.0)

## 2018-11-23 LAB — PROCALCITONIN: Procalcitonin: 0.38 ng/mL

## 2018-11-23 LAB — TROPONIN I (HIGH SENSITIVITY): Troponin I (High Sensitivity): 85 ng/L — ABNORMAL HIGH (ref <18)

## 2018-11-23 LAB — PHOSPHORUS: Phosphorus: 4.1 mg/dL (ref 2.5–4.6)

## 2018-11-23 LAB — FIBRIN DERIVATIVES D-DIMER (ARMC ONLY): Fibrin derivatives D-dimer (ARMC): 1581.63 ng/mL (FEU) — ABNORMAL HIGH (ref 0.00–499.00)

## 2018-11-23 LAB — MAGNESIUM: Magnesium: 2 mg/dL (ref 1.7–2.4)

## 2018-11-23 LAB — CK: Total CK: 128 U/L (ref 49–397)

## 2018-11-23 LAB — LACTATE DEHYDROGENASE: LDH: 259 U/L — ABNORMAL HIGH (ref 98–192)

## 2018-11-23 MED ORDER — SODIUM CHLORIDE 0.9 % IV SOLN
100.0000 mg | INTRAVENOUS | Status: DC
  Filled 2018-11-23: qty 20

## 2018-11-23 MED ORDER — ONDANSETRON HCL 4 MG/2ML IJ SOLN: 4.0000 mg | Freq: Four times a day (QID) | INTRAMUSCULAR | Status: DC | PRN

## 2018-11-23 MED ORDER — GUAIFENESIN-DM 100-10 MG/5ML PO SYRP: 10.0000 mL | ORAL_SOLUTION | ORAL | Status: DC | PRN

## 2018-11-23 MED ORDER — SODIUM CHLORIDE 0.9 % IV SOLN: 250.0000 mL | INTRAVENOUS | Status: DC | PRN

## 2018-11-23 MED ORDER — ENOXAPARIN SODIUM 40 MG/0.4ML ~~LOC~~ SOLN: 40.0000 mg | SUBCUTANEOUS | Status: DC

## 2018-11-23 MED ORDER — DEXAMETHASONE SODIUM PHOSPHATE 10 MG/ML IJ SOLN
6.0000 mg | INTRAMUSCULAR | Status: DC
  Administered 2018-11-23 – 2018-11-26 (×4): 6 mg via INTRAVENOUS
  Filled 2018-11-23 (×4): qty 1

## 2018-11-23 MED ORDER — SODIUM CHLORIDE 0.9 % IV SOLN
100.0000 mg | INTRAVENOUS | Status: AC
  Administered 2018-11-23: 100 mg via INTRAVENOUS
  Filled 2018-11-23: qty 100

## 2018-11-23 MED ORDER — HYDROCOD POLST-CPM POLST ER 10-8 MG/5ML PO SUER
5.0000 mL | Freq: Two times a day (BID) | ORAL | Status: DC | PRN
  Administered 2018-11-30: 5 mL via ORAL
  Filled 2018-11-23: qty 5

## 2018-11-23 MED ORDER — ONDANSETRON HCL 4 MG PO TABS: 4.0000 mg | ORAL_TABLET | Freq: Four times a day (QID) | ORAL | Status: DC | PRN

## 2018-11-23 MED ORDER — ACETAMINOPHEN 325 MG PO TABS
650.0000 mg | ORAL_TABLET | Freq: Four times a day (QID) | ORAL | Status: DC | PRN
  Administered 2018-11-28 – 2018-11-29 (×2): 650 mg via ORAL
  Filled 2018-11-23 (×2): qty 2

## 2018-11-23 MED ORDER — SODIUM CHLORIDE 0.9 % IV SOLN
INTRAVENOUS | Status: DC
  Administered 2018-11-23: 15:00:00 via INTRAVENOUS

## 2018-11-23 MED ORDER — ZINC SULFATE 220 (50 ZN) MG PO CAPS
220.0000 mg | ORAL_CAPSULE | Freq: Every day | ORAL | Status: DC
  Administered 2018-11-24 – 2018-12-01 (×7): 220 mg via ORAL
  Filled 2018-11-23 (×8): qty 1

## 2018-11-23 MED ORDER — SODIUM CHLORIDE 0.9% FLUSH: 3.0000 mL | INTRAVENOUS | Status: DC | PRN

## 2018-11-23 MED ORDER — VITAMIN C 500 MG PO TABS
500.0000 mg | ORAL_TABLET | Freq: Every day | ORAL | Status: DC
  Administered 2018-11-24 – 2018-12-01 (×7): 500 mg via ORAL
  Filled 2018-11-23 (×8): qty 1

## 2018-11-23 MED ORDER — SODIUM CHLORIDE 0.9 % IV SOLN: 100.0000 mg | INTRAVENOUS | Status: DC

## 2018-11-23 MED ORDER — SODIUM CHLORIDE 0.9% FLUSH
3.0000 mL | Freq: Two times a day (BID) | INTRAVENOUS | Status: DC
  Administered 2018-11-23 – 2018-12-01 (×14): 3 mL via INTRAVENOUS

## 2018-11-23 MED ORDER — ENOXAPARIN SODIUM 30 MG/0.3ML ~~LOC~~ SOLN
30.0000 mg | SUBCUTANEOUS | Status: DC
  Administered 2018-11-23: 30 mg via SUBCUTANEOUS
  Filled 2018-11-23: qty 0.3

## 2018-11-23 NOTE — H&P (Signed)
History and Physical    Albert [name] [id number] DOB: [birth year] DOA: 11/23/2018  PCP: Patient, No Pcp Per  Patient coming from:  home  Chief Complaint:  Weakness, sob  HPI: Albert [name] is a 84 y.o. male with medical history significant of htn lives at home comes in for several days of profound weakness and progressive worsening sob.  No n/v/d.  No cp or abd pain.  No fevers.  Mostly extreme weakness.  Not eating and drinking well.  Arrived at [facility] found to be in aki mild with dehydration.  Given ivf and feels much better already.  Ct chest shows bilateral infiltrates 02 sats nml.  Pt referred for admission for covid pna.  Review of Systems: As per HPI otherwise 10 point review of systems negative.   Past Medical History:  Diagnosis Date  . Hypertension      has no history on file for tobacco, alcohol, and drug.  No Known Allergies  No family history on file. no premature CAD  Prior to Admission medications   Medication Sig Start Date End Date Taking? Authorizing Provider  aspirin EC 81 MG tablet Take 81 mg by mouth daily.    [provider]  atorvastatin (LIPITOR) 20 MG tablet Take 20 mg by mouth daily. 11/20/18   [provider]  calcium-vitamin D (CALCIUM 500+D HIGH POTENCY) 500-400 MG-UNIT tablet Take 1 tablet by mouth daily.    [provider]  celecoxib (CELEBREX) 200 MG capsule Take 200 mg by mouth daily. 11/20/18   [provider]  hydrochlorothiazide (HYDRODIURIL) 25 MG tablet Take 25 mg by mouth daily. 09/17/18   [provider]    Physical Exam: Vitals:   11/23/18 2200  BP: 132/65  Pulse: 74  Resp: 15  Temp: 97.6 F (36.4 C)  TempSrc: Oral  SpO2: 99%  Weight: 60.4 kg  Height: 5\' 9"  (1.753 m)      Constitutional: NAD, calm, comfortable Vitals:   11/23/18 2200  BP: 132/65  Pulse: 74  Resp: 15  Temp: 97.6 F (36.4 C)  TempSrc: Oral  SpO2: 99%  Weight: 60.4 kg  Height: 5\' 9"  (1.753 m)   Eyes:  PERRL, lids and conjunctivae normal ENMT: Mucous membranes are moist. Posterior pharynx clear of any exudate or lesions.Normal dentition.  Neck: normal, supple, no masses, no thyromegaly Respiratory: clear to auscultation bilaterally, no wheezing, no crackles. Normal respiratory effort. No accessory muscle use.  Cardiovascular: Regular rate and rhythm, no murmurs / rubs / gallops. No extremity edema. 2+ pedal pulses. No carotid bruits.  Abdomen: no tenderness, no masses palpated. No hepatosplenomegaly. Bowel sounds positive.  Musculoskeletal: no clubbing / cyanosis. No joint deformity upper and lower extremities. Good ROM, no contractures. Normal muscle tone.  Skin: no rashes, lesions, ulcers. No induration Neurologic: CN 2-12 grossly intact. Sensation intact, DTR normal. Strength 5/5 in all 4.  Psychiatric: Normal judgment and insight. Alert and oriented x 3. Normal mood.    Labs on Admission: I have personally reviewed following labs and imaging studies  CBC: Recent Labs  Lab 11/22/18 1251 11/22/18 1912 11/23/18 0533  WBC 10.4 10.6* 8.1  NEUTROABS 9.1*  --  7.3  HGB 14.2 11.5* 10.9*  HCT 40.5 32.6* 31.3*  MCV 79.7* 80.1 81.1  PLT 190 165 [id number]*   Basic Metabolic Panel: Recent Labs  Lab 11/22/18 1251 11/22/18 1912 11/23/18 0533  NA 134*  --  135  K 3.6  --  3.4*  CL 95*  --  96*  CO2 23  --  22  GLUCOSE 133*  --  152*  BUN 73*  --  73*  CREATININE 1.78* 1.58* 1.61*  CALCIUM 8.7*  --  8.0*  MG 2.1 1.8 2.0  PHOS  --  2.7 4.1   GFR: Estimated Creatinine Clearance: 29.7 mL/min (A) (by C-G formula based on SCr of 1.61 mg/dL (H)). Liver Function Tests: Recent Labs  Lab 11/22/18 1251 11/22/18 1912 11/23/18 0533  AST 37 31 35  ALT 11 11 9   ALKPHOS 75 57 57  BILITOT 2.3* 1.6* 1.2  PROT 7.4 5.8* 6.0*  ALBUMIN 3.1* 2.5* 2.4*   No results for input(s): LIPASE, AMYLASE in the last 168 hours. No results for input(s): AMMONIA in the last 168 hours. Coagulation Profile:  No results for input(s): INR, PROTIME in the last 168 hours. Cardiac Enzymes: Recent Labs  Lab 11/22/18 1251 11/23/18 0533  CKTOTAL 65 128   BNP (last 3 results) No results for input(s): PROBNP in the last 8760 hours. HbA1C: No results for input(s): HGBA1C in the last 72 hours. CBG: No results for input(s): GLUCAP in the last 168 hours. Lipid Profile: No results for input(s): CHOL, HDL, LDLCALC, TRIG, CHOLHDL, LDLDIRECT in the last 72 hours. Thyroid Function Tests: Recent Labs    11/22/18 1912  TSH 0.126*  FREET4 1.45*   Anemia Panel: No results for input(s): VITAMINB12, FOLATE, FERRITIN, TIBC, IRON, RETICCTPCT in the last 72 hours. Urine analysis:    Component Value Date/Time   COLORURINE YELLOW (A) 11/22/2018 1251   APPEARANCEUR HAZY (A) 11/22/2018 1251   LABSPEC 1.015 11/22/2018 1251   PHURINE 5.0 11/22/2018 1251   GLUCOSEU NEGATIVE 11/22/2018 1251   HGBUR SMALL (A) 11/22/2018 1251   BILIRUBINUR NEGATIVE 11/22/2018 1251   KETONESUR NEGATIVE 11/22/2018 1251   PROTEINUR 100 (A) 11/22/2018 1251   NITRITE NEGATIVE 11/22/2018 1251   LEUKOCYTESUR NEGATIVE 11/22/2018 1251   Sepsis Labs: !!!!!!!!!!!!!!!!!!!!!!!!!!!!!!!!!!!!!!!!!!!! @LABRCNTIP (procalcitonin:4,lacticidven:4) ) Recent Results (from the past 240 hour(s))  SARS CORONAVIRUS 2 (TAT 6-24 HRS) Nasopharyngeal Nasopharyngeal Swab     Status: Abnormal   Collection Time: 11/22/18  1:53 PM   Specimen: Nasopharyngeal Swab  Result Value Ref Range Status   SARS Coronavirus 2 POSITIVE (A) NEGATIVE Final    Comment: (NOTE) SARS-CoV-2 target nucleic acids are DETECTED. The SARS-CoV-2 RNA is generally detectable in upper and lower respiratory specimens during the acute phase of infection. Positive results are indicative of active infection with SARS-CoV-2. Clinical  correlation with patient history and other diagnostic information is necessary to determine patient infection status. Positive results do  not rule out  bacterial infection or co-infection with other viruses. The expected result is Negative. Fact Sheet for Patients: SugarRoll.be Fact Sheet for Healthcare Providers: https://www.woods-mathews.com/ This test is not yet approved or cleared by the [place] FDA and  has been authorized for detection and/or diagnosis of SARS-CoV-2 by FDA under an Emergency Use Authorization (EUA). This EUA will remain  in effect (meaning this test can be used) for the duration of the COVID-19 declaration under Section 564(b)(1) of the Act, 21 U.S.C.  section 360bbb-3(b)(1), unless the authorization is terminated or revoked sooner. Performed at [facility], [place] [address] [place], [place] [address]   Blood culture (routine x 2)     Status: None (Preliminary result)   Collection Time: 11/22/18  5:49 PM   Specimen: BLOOD  Result Value Ref Range Status   Specimen Description BLOOD RIGHT ANTECUBITAL  Final   Special  Requests   Final    BOTTLES DRAWN AEROBIC AND ANAEROBIC Blood Culture adequate volume   Culture   Final    NO GROWTH < 12 HOURS Performed at [facility], [address] [place], [place] [address]    Report Status PENDING  Incomplete  Blood culture (routine x 2)     Status: None (Preliminary result)   Collection Time: 11/22/18  5:49 PM   Specimen: BLOOD  Result Value Ref Range Status   Specimen Description BLOOD BLOOD LEFT ARM  Final   Special Requests   Final    BOTTLES DRAWN AEROBIC AND ANAEROBIC Blood Culture results may not be optimal due to an excessive volume of blood received in culture bottles   Culture   Final    NO GROWTH < 12 HOURS Performed at [facility], [address], [place], [place] [address]    Report Status PENDING  Incomplete     Radiological Exams on Admission: Ct Head Wo Contrast  Result Date: 11/22/2018 CLINICAL DATA:  Unexplained altered level of consciousness. Weakness and dizziness.  Duration of symptoms 3 days. EXAM: CT HEAD WITHOUT CONTRAST TECHNIQUE: Contiguous axial images were obtained from the base of the skull through the vertex without intravenous contrast. COMPARISON:  None. FINDINGS: Brain: Age related volume loss. Mild chronic appearing small vessel change of the hemispheric white matter. No sign of acute infarction, mass lesion, hemorrhage, hydrocephalus or extra-axial collection. Vascular: There is atherosclerotic calcification of the major vessels at the base of the brain. Skull: Negative Sinuses/Orbits: Clear/normal Other: None IMPRESSION: No acute finding. Ordinary mild age related volume loss and small-vessel change of the white matter. Electronically Signed   By: Nelson Chimes M.D.   On: 11/22/2018 13:53   Ct Angio Chest Pe W And/or Wo Contrast  Addendum Date: 11/22/2018   ADDENDUM REPORT: 11/22/2018 16:54 ADDENDUM: Ascending thoracic aortic dilatation to 4.1 cm. Recommend annual imaging followup by CTA or MRA. This recommendation follows 2010 ACCF/AHA/AATS/ACR/ASA/SCA/SCAI/SIR/STS/SVM Guidelines for the Diagnosis and Management of Patients with Thoracic Aortic Disease. Circulation. 2010; [id number]. Aortic aneurysm NOS (ICD10-I71.9) This addendum was called by telephone at the time of dictation on 11/22/2018 at 4:53 pm to provider Dr. Alford Highland, Who verbally acknowledged these results. Electronically Signed   By: Lovena Le M.D.   On: 11/22/2018 16:54   Result Date: 11/22/2018 CLINICAL DATA:  Chest pain, complex, high probability of ACS/PE/acute aortic syndrome EXAM: CT ANGIOGRAPHY CHEST WITH CONTRAST TECHNIQUE: Multidetector CT imaging of the chest was performed using the standard protocol during bolus administration of intravenous contrast. Multiplanar CT image reconstructions and MIPs were obtained to evaluate the vascular anatomy. CONTRAST:  1mL OMNIPAQUE IOHEXOL 350 MG/ML SOLN COMPARISON:  Chest radiograph 11/22/2018 FINDINGS: Cardiovascular: Motion artifact  limits evaluation of the pulmonary arteries beyond the the lobar level. No central or lobar filling defects are seen. Central pulmonary arteries are normal caliber. Mild cardiomegaly. Extensive coronary artery calcifications are noted. Dense mitral annular calcification is seen as well. No pericardial effusion. Borderline dilation of the ascending thoracic aorta 4.1 cm. Atherosclerotic calcifications are present throughout the thoracic aorta and proximal great vessels. Normal 3 vessel branching of the aortic arch. Mediastinum/Nodes: Scattered low-attenuation borderline enlarged mediastinal and hilar nodes are present including a 11 mm subcarinal lymph node (4/43) and 11 mm right hilar lymph node (4/42). No acute abnormality of the trachea. Posterior bowing compatible with imaging during exhalation. Slightly heterogeneous appearance of the thyroid gland with few punctate calcifications in several hypoattenuating nodules  with slight enlargement of the left lobe. Small hiatal hernia. No acute esophageal abnormality. Lungs/Pleura: There are multifocal areas of ground-glass attenuation with an appearance conspicuous for underlying infection or developing consolidation superimposed on areas of atelectasis. There is diffuse airways thickening. No suspicious nodules or masses. No pneumothorax or effusion. Upper Abdomen: No acute abnormalities present in the visualized portions of the upper abdomen. Punctate benign-appearing calcification head Musculoskeletal: Exaggeration of the normal thoracic kyphosis with multilevel bridging syndesmophytes across the spine suggesting some degree of ankylosis. No acute or suspicious osseous lesions are seen. There is an increased AP diameter of the chest associated with the exaggerated curvature. Additional discogenic changes are present in the cervical spine as well. Review of the MIP images confirms the above findings. IMPRESSION: 1. Motion artifact limits evaluation of the pulmonary  arteries beyond the lobar level. No central or lobar filling defects are seen. 2. Multifocal areas of ground-glass attenuation with an appearance conspicuous for underlying infection or developing consolidation superimposed on areas of atelectasis. 3. Scattered borderline enlarged mediastinal and hilar nodes, nonspecific, but likely reactive. 4. Exaggerated thoracic kyphosis and bridging syndesmophyte formation conspicuous for ankylosing spondylitis. 5. Aortic Atherosclerosis (ICD10-I70.0). Electronically Signed: By: Lovena Le M.D. On: 11/22/2018 14:46   Dg Chest Portable 1 View  Result Date: 11/22/2018 CLINICAL DATA:  Shortness of breath EXAM: PORTABLE CHEST 1 VIEW COMPARISON:  None. FINDINGS: There is apparent scarring in the right mid and lower lung zones. There is bibasilar atelectasis. There is no edema or consolidation. Heart size and pulmonary vascularity are normal. No adenopathy. There is aortic atherosclerosis. There is degenerative change in the thoracic spine. IMPRESSION: No frank edema or consolidation. Bibasilar atelectasis with apparent scarring in the right mid lower lung zones. Heart size within normal limits. No adenopathy. Aortic Atherosclerosis (ICD10-I70.0). Electronically Signed   By: Lowella Grip III M.D.   On: 11/22/2018 13:24   ekg nonischemic Old chart rviewed cxr no edema or infiltrate cta shows diffuse opacities   Assessment/Plan 85 yo healthy male comes in with aki, bilateral infiltrates from covid pna with normal 02 sats  Principal Problem:   Pneumonia due to COVID-19 virus- pt agreeable to decadron and remdesivir.  Afvss.  Also on lovenox/vitc/zinc.  Daily labs ordered and inflammatory markers to be followed daily.    Active Problems:   AKI (acute kidney injury) ([place])- given gentle ivf.  repaet in am.  Bump up to 1.7    Elevated troponin- serial, no cp,  ekg neg.  Follow and trend.  Typical with covid    Benign essential hypertension- stable, clarify and  resume home meds    Generalized weakness- noted    DVT prophylaxis:  lovenox Code Status: full Family Communication: none Disposition Plan:  1-2 days likely Consults called:  none Admission status:  admission   Nina Mondor A MD [facility]  If 7PM-7AM, please contact night-coverage [email or web address] Password [facility]  11/23/2018, 10:42 PM

## 2018-11-23 NOTE — Progress Notes (Signed)
PROGRESS NOTE    Albert [name]  [id number] DOB: Sep 30, 1933 DOA: 11/22/2018 PCP: Patient, No Pcp Per      Brief Narrative:  Mr. Albert [name] is a 85 y.o. M with HTN who presented with SOB and weakness.  In the ER, SpO2 normal but CT showed multifocal pneumonia.  Creatinine 1.78.         Assessment & Plan:  Coronavirus pneumonitis without acute hypoxic respiratory failure In the setting of the ongoing 2020 COVID-19 pandemic.  Patient presents with severe dyspnea, very weak, unable to take PO. CXR with infiltrates.  Also AKI. -Continue remdesivir -Continue dexamethasone -VTE PPx with heparin -Continue Zinc and Vitamin C  Cannot rule out concurrent bacterial pneumonia -Continue ceftriaxone, azithromycin    AKI versus CKD stage IIIb -Continue judicious IV fluids  Weakness Patient unable to walk. -PT eval  Hypertension -Continue aspirin, atorvastatin        MDM and disposition: The below labs and imaging reports were reviewed and summarized above.  Medication management as above.  The patient was admitted with COVID-19.  He has improved and is able to take PO, but will need PT eval for disposition.     DVT prophylaxis: Heparin Code Status: FULL Family Communication:      Procedures:     Antimicrobials:   Remdesivir  Ceftriaxone  Azithromycin       Subjective: Feeling much better, appetite good.  No confusion.  He is very dyspneic with minimal exertion, just sitting on the bed.  No more fever.  No hemoptysis, respiratory distress.  Objective: Vitals:   11/23/18 1530 11/23/18 1600 11/23/18 1700 11/23/18 1800  BP: 125/74 93/60 112/62 108/66  Pulse: 84 72 76 71  Resp: (!) 24 14 15 17   Temp:      TempSrc:      SpO2: 97% 99% 98% 98%  Weight:      Height:        Intake/Output Summary (Last 24 hours) at 11/23/2018 1818 Last data filed at 11/23/2018 1542 Gross per 24 hour  Intake 350 ml  Output -  Net 350 ml   Filed Weights   11/22/18 1248  Weight: 61.2 kg    Examination: General appearance: Thin elderly adult male, alert and in no acute distress.   HEENT: Anicteric, conjunctiva pink, lids and lashes normal. No nasal deformity, discharge, epistaxis.  Lips moist, dentition normal, oropharynx moist, no oral lesions, hearing normal.   Skin: Warm and dry.  No jaundice.  No suspicious rashes or lesions. Cardiac: RRR, nl S1-S2, no murmurs appreciated.  Capillary refill is brisk.  JVP normal.  No LE edema.  Radia pulses 2+ and symmetric. Respiratory: Normal respiratory rate and rhythm.  CTAB without rales or wheezes.  Very dyspneic with minimal exertion. Abdomen: Abdomen soft.  No TTP or guarding. No ascites, distension, hepatosplenomegaly.   MSK: No deformities or effusions.  Fuhs loss of subcutaneous muscle mass and fat. Neuro: Awake and alert.  EOMI, moves all extremities. Speech fluent.    Psych: Sensorium intact and responding to questions, attention normal. Affect normal.  Judgment and insight appear normal.      Data Reviewed: I have personally reviewed following labs and imaging studies:  CBC: Recent Labs  Lab 11/22/18 1251 11/22/18 1912 11/23/18 0533  WBC 10.4 10.6* 8.1  NEUTROABS 9.1*  --  7.3  HGB 14.2 11.5* 10.9*  HCT 40.5 32.6* 31.3*  MCV 79.7* 80.1 81.1  PLT 190 165 [id number]*   Basic Metabolic Panel:  Recent Labs  Lab 11/22/18 1251 11/22/18 1912 11/23/18 0533  NA 134*  --  135  K 3.6  --  3.4*  CL 95*  --  96*  CO2 23  --  22  GLUCOSE 133*  --  152*  BUN 73*  --  73*  CREATININE 1.78* 1.58* 1.61*  CALCIUM 8.7*  --  8.0*  MG 2.1 1.8 2.0  PHOS  --  2.7 4.1   GFR: Estimated Creatinine Clearance: 30.1 mL/min (A) (by C-G formula based on SCr of 1.61 mg/dL (H)). Liver Function Tests: Recent Labs  Lab 11/22/18 1251 11/22/18 1912 11/23/18 0533  AST 37 31 35  ALT 11 11 9   ALKPHOS 75 57 57  BILITOT 2.3* 1.6* 1.2  PROT 7.4 5.8* 6.0*  ALBUMIN 3.1* 2.5* 2.4*   No results for input(s):  LIPASE, AMYLASE in the last 168 hours. No results for input(s): AMMONIA in the last 168 hours. Coagulation Profile: No results for input(s): INR, PROTIME in the last 168 hours. Cardiac Enzymes: Recent Labs  Lab 11/22/18 1251 11/23/18 0533  CKTOTAL 65 128   BNP (last 3 results) No results for input(s): PROBNP in the last 8760 hours. HbA1C: No results for input(s): HGBA1C in the last 72 hours. CBG: No results for input(s): GLUCAP in the last 168 hours. Lipid Profile: No results for input(s): CHOL, HDL, LDLCALC, TRIG, CHOLHDL, LDLDIRECT in the last 72 hours. Thyroid Function Tests: Recent Labs    11/22/18 1912  TSH 0.126*  FREET4 1.45*   Anemia Panel: No results for input(s): VITAMINB12, FOLATE, FERRITIN, TIBC, IRON, RETICCTPCT in the last 72 hours. Urine analysis:    Component Value Date/Time   COLORURINE YELLOW (A) 11/22/2018 1251   APPEARANCEUR HAZY (A) 11/22/2018 1251   LABSPEC 1.015 11/22/2018 1251   PHURINE 5.0 11/22/2018 1251   GLUCOSEU NEGATIVE 11/22/2018 1251   HGBUR SMALL (A) 11/22/2018 1251   BILIRUBINUR NEGATIVE 11/22/2018 1251   KETONESUR NEGATIVE 11/22/2018 1251   PROTEINUR 100 (A) 11/22/2018 1251   NITRITE NEGATIVE 11/22/2018 1251   LEUKOCYTESUR NEGATIVE 11/22/2018 1251   Sepsis Labs: @LABRCNTIP (procalcitonin:4,lacticacidven:4)  ) Recent Results (from the past 240 hour(s))  SARS CORONAVIRUS 2 (TAT 6-24 HRS) Nasopharyngeal Nasopharyngeal Swab     Status: Abnormal   Collection Time: 11/22/18  1:53 PM   Specimen: Nasopharyngeal Swab  Result Value Ref Range Status   SARS Coronavirus 2 POSITIVE (A) NEGATIVE Final    Comment: (NOTE) SARS-CoV-2 target nucleic acids are DETECTED. The SARS-CoV-2 RNA is generally detectable in upper and lower respiratory specimens during the acute phase of infection. Positive results are indicative of active infection with SARS-CoV-2. Clinical  correlation with patient history and other diagnostic information is necessary  to determine patient infection status. Positive results do  not rule out bacterial infection or co-infection with other viruses. The expected result is Negative. Fact Sheet for Patients: SugarRoll.be Fact Sheet for Healthcare Providers: https://www.woods-mathews.com/ This test is not yet approved or cleared by the [place] FDA and  has been authorized for detection and/or diagnosis of SARS-CoV-2 by FDA under an Emergency Use Authorization (EUA). This EUA will remain  in effect (meaning this test can be used) for the duration of the COVID-19 declaration under Section 564(b)(1) of the Act, 21 U.S.C.  section 360bbb-3(b)(1), unless the authorization is terminated or revoked sooner. Performed at [facility], [place] [address], [place], [place] [address]   Blood culture (routine x 2)     Status: None (Preliminary result)  Collection Time: 11/22/18  5:49 PM   Specimen: BLOOD  Result Value Ref Range Status   Specimen Description BLOOD RIGHT ANTECUBITAL  Final   Special Requests   Final    BOTTLES DRAWN AEROBIC AND ANAEROBIC Blood Culture adequate volume   Culture   Final    NO GROWTH < 12 HOURS Performed at [facility], [address], [place], [place] [address]    Report Status PENDING  Incomplete  Blood culture (routine x 2)     Status: None (Preliminary result)   Collection Time: 11/22/18  5:49 PM   Specimen: BLOOD  Result Value Ref Range Status   Specimen Description BLOOD BLOOD LEFT ARM  Final   Special Requests   Final    BOTTLES DRAWN AEROBIC AND ANAEROBIC Blood Culture results may not be optimal due to an excessive volume of blood received in culture bottles   Culture   Final    NO GROWTH < 12 HOURS Performed at [facility], [address] [place], [place] [address]    Report Status PENDING  Incomplete         Radiology Studies: Ct Head Wo Contrast  Result Date: 11/22/2018 CLINICAL DATA:   Unexplained altered level of consciousness. Weakness and dizziness. Duration of symptoms 3 days. EXAM: CT HEAD WITHOUT CONTRAST TECHNIQUE: Contiguous axial images were obtained from the base of the skull through the vertex without intravenous contrast. COMPARISON:  None. FINDINGS: Brain: Age related volume loss. Mild chronic appearing small vessel change of the hemispheric white matter. No sign of acute infarction, mass lesion, hemorrhage, hydrocephalus or extra-axial collection. Vascular: There is atherosclerotic calcification of the major vessels at the base of the brain. Skull: Negative Sinuses/Orbits: Clear/normal Other: None IMPRESSION: No acute finding. Ordinary mild age related volume loss and small-vessel change of the white matter. Electronically Signed   By: Nelson Chimes M.D.   On: 11/22/2018 13:53   Ct Angio Chest Pe W And/or Wo Contrast  Addendum Date: 11/22/2018   ADDENDUM REPORT: 11/22/2018 16:54 ADDENDUM: Ascending thoracic aortic dilatation to 4.1 cm. Recommend annual imaging followup by CTA or MRA. This recommendation follows 2010 ACCF/AHA/AATS/ACR/ASA/SCA/SCAI/SIR/STS/SVM Guidelines for the Diagnosis and Management of Patients with Thoracic Aortic Disease. Circulation. 2010; [id number]. Aortic aneurysm NOS (ICD10-I71.9) This addendum was called by telephone at the time of dictation on 11/22/2018 at 4:53 pm to provider Dr. Alford Highland, Who verbally acknowledged these results. Electronically Signed   By: Lovena Le M.D.   On: 11/22/2018 16:54   Result Date: 11/22/2018 CLINICAL DATA:  Chest pain, complex, high probability of ACS/PE/acute aortic syndrome EXAM: CT ANGIOGRAPHY CHEST WITH CONTRAST TECHNIQUE: Multidetector CT imaging of the chest was performed using the standard protocol during bolus administration of intravenous contrast. Multiplanar CT image reconstructions and MIPs were obtained to evaluate the vascular anatomy. CONTRAST:  63mL OMNIPAQUE IOHEXOL 350 MG/ML SOLN COMPARISON:   Chest radiograph 11/22/2018 FINDINGS: Cardiovascular: Motion artifact limits evaluation of the pulmonary arteries beyond the the lobar level. No central or lobar filling defects are seen. Central pulmonary arteries are normal caliber. Mild cardiomegaly. Extensive coronary artery calcifications are noted. Dense mitral annular calcification is seen as well. No pericardial effusion. Borderline dilation of the ascending thoracic aorta 4.1 cm. Atherosclerotic calcifications are present throughout the thoracic aorta and proximal great vessels. Normal 3 vessel branching of the aortic arch. Mediastinum/Nodes: Scattered low-attenuation borderline enlarged mediastinal and hilar nodes are present including a 11 mm subcarinal lymph node (4/43) and 11 mm right hilar lymph  node (4/42). No acute abnormality of the trachea. Posterior bowing compatible with imaging during exhalation. Slightly heterogeneous appearance of the thyroid gland with few punctate calcifications in several hypoattenuating nodules with slight enlargement of the left lobe. Small hiatal hernia. No acute esophageal abnormality. Lungs/Pleura: There are multifocal areas of ground-glass attenuation with an appearance conspicuous for underlying infection or developing consolidation superimposed on areas of atelectasis. There is diffuse airways thickening. No suspicious nodules or masses. No pneumothorax or effusion. Upper Abdomen: No acute abnormalities present in the visualized portions of the upper abdomen. Punctate benign-appearing calcification head Musculoskeletal: Exaggeration of the normal thoracic kyphosis with multilevel bridging syndesmophytes across the spine suggesting some degree of ankylosis. No acute or suspicious osseous lesions are seen. There is an increased AP diameter of the chest associated with the exaggerated curvature. Additional discogenic changes are present in the cervical spine as well. Review of the MIP images confirms the above  findings. IMPRESSION: 1. Motion artifact limits evaluation of the pulmonary arteries beyond the lobar level. No central or lobar filling defects are seen. 2. Multifocal areas of ground-glass attenuation with an appearance conspicuous for underlying infection or developing consolidation superimposed on areas of atelectasis. 3. Scattered borderline enlarged mediastinal and hilar nodes, nonspecific, but likely reactive. 4. Exaggerated thoracic kyphosis and bridging syndesmophyte formation conspicuous for ankylosing spondylitis. 5. Aortic Atherosclerosis (ICD10-I70.0). Electronically Signed: By: Lovena Le M.D. On: 11/22/2018 14:46   Dg Chest Portable 1 View  Result Date: 11/22/2018 CLINICAL DATA:  Shortness of breath EXAM: PORTABLE CHEST 1 VIEW COMPARISON:  None. FINDINGS: There is apparent scarring in the right mid and lower lung zones. There is bibasilar atelectasis. There is no edema or consolidation. Heart size and pulmonary vascularity are normal. No adenopathy. There is aortic atherosclerosis. There is degenerative change in the thoracic spine. IMPRESSION: No frank edema or consolidation. Bibasilar atelectasis with apparent scarring in the right mid lower lung zones. Heart size within normal limits. No adenopathy. Aortic Atherosclerosis (ICD10-I70.0). Electronically Signed   By: Lowella Grip III M.D.   On: 11/22/2018 13:24        Scheduled Meds: . aspirin EC  81 mg Oral Daily  . atorvastatin  20 mg Oral Daily  . calcium-vitamin D  1 tablet Oral Daily  . dexamethasone (DECADRON) injection  6 mg Intravenous Q24H  . heparin  5,000 Units Subcutaneous Q8H  . ipratropium  2 puff Inhalation Q6H  . vitamin C  500 mg Oral Daily  . zinc sulfate  220 mg Oral Daily   Continuous Infusions: . sodium chloride 75 mL/hr at 11/23/18 1500  . azithromycin Stopped (11/23/18 0057)  . cefTRIAXone (ROCEPHIN)  IV Stopped (11/23/18 1542)  . remdesivir 100 mg in NS 250 mL       LOS: 0 days    Time  spent: 25 minutes      Edwin Dada, MD [facility] 11/23/2018, 6:18 PM     Please page through [place]:  [email or web address] Contact charge nurse for password If 7PM-7AM, please contact night-coverage

## 2018-11-23 NOTE — ED Notes (Signed)
Pt was concerned earlier that he had not been in touch with his niece. Pt has his phone at bedside and he told me that he was able to get in touch with her.

## 2018-11-23 NOTE — Progress Notes (Signed)
Patient arrived to [facility] bed 166 from [facility] ED. CCMD and covering MD, R. Shanon Brow, contacted about patient arrival. Patient oriented to room and unit procedures. Call light in reach. Assessment via flow sheets.

## 2018-11-23 NOTE — ED Notes (Signed)
EMTALA and Medical Necessity documentation reviewed at this time and found to be compete per policy. 

## 2018-11-23 NOTE — ED Notes (Signed)
Admitting MD at bedside.

## 2018-11-23 NOTE — ED Notes (Signed)
Pt has been provided with dinner tray, he is watching TV and has phone at bedside.

## 2018-11-23 NOTE — ED Notes (Signed)
Pt on his cell phone at this time, TV is on as well.

## 2018-11-23 NOTE — ED Notes (Signed)
Admitting provider at bedside.

## 2018-11-24 ENCOUNTER — Encounter (HOSPITAL_COMMUNITY): Payer: Self-pay

## 2018-11-24 DIAGNOSIS — N1831 Chronic kidney disease, stage 3a: Secondary | ICD-10-CM

## 2018-11-24 DIAGNOSIS — N179 Acute kidney failure, unspecified: Secondary | ICD-10-CM

## 2018-11-24 DIAGNOSIS — E860 Dehydration: Secondary | ICD-10-CM

## 2018-11-24 DIAGNOSIS — N183 Chronic kidney disease, stage 3 unspecified: Secondary | ICD-10-CM

## 2018-11-24 DIAGNOSIS — I10 Essential (primary) hypertension: Secondary | ICD-10-CM

## 2018-11-24 LAB — COMPREHENSIVE METABOLIC PANEL
ALT: 14 U/L (ref 0–44)
AST: 39 U/L (ref 15–41)
Albumin: 2.2 g/dL — ABNORMAL LOW (ref 3.5–5.0)
Alkaline Phosphatase: 65 U/L (ref 38–126)
Anion gap: 14 (ref 5–15)
BUN: 79 mg/dL — ABNORMAL HIGH (ref 8–23)
CO2: 19 mmol/L — ABNORMAL LOW (ref 22–32)
Calcium: 7.8 mg/dL — ABNORMAL LOW (ref 8.9–10.3)
Chloride: 102 mmol/L (ref 98–111)
Creatinine, Ser: 1.25 mg/dL — ABNORMAL HIGH (ref 0.61–1.24)
GFR calc Af Amer: >60 mL/min (ref >60)
GFR calc non Af Amer: 53 mL/min — ABNORMAL LOW (ref >60)
Glucose, Bld: 125 mg/dL — ABNORMAL HIGH (ref 70–99)
Potassium: 3.5 mmol/L (ref 3.5–5.1)
Sodium: 135 mmol/L (ref 135–145)
Total Bilirubin: 0.9 mg/dL (ref 0.3–1.2)
Total Protein: 5.3 g/dL — ABNORMAL LOW (ref 6.5–8.1)

## 2018-11-24 LAB — CBC WITH DIFFERENTIAL/PLATELET
Abs Immature Granulocytes: 0.1 10*3/uL — ABNORMAL HIGH (ref 0.00–0.07)
Basophils Absolute: 0 10*3/uL (ref 0.0–0.1)
Basophils Relative: 0 %
Eosinophils Absolute: 0 10*3/uL (ref 0.0–0.5)
Eosinophils Relative: 0 %
HCT: 29.6 % — ABNORMAL LOW (ref 39.0–52.0)
Hemoglobin: 10.3 g/dL — ABNORMAL LOW (ref 13.0–17.0)
Immature Granulocytes: 1 %
Lymphocytes Relative: 9 %
Lymphs Abs: 0.9 10*3/uL (ref 0.7–4.0)
MCH: 28.6 pg (ref 26.0–34.0)
MCHC: 34.8 g/dL (ref 30.0–36.0)
MCV: 82.2 fL (ref 80.0–100.0)
Monocytes Absolute: 0.4 10*3/uL (ref 0.1–1.0)
Monocytes Relative: 4 %
Neutro Abs: 9.1 10*3/uL — ABNORMAL HIGH (ref 1.7–7.7)
Neutrophils Relative %: 86 %
Platelets: 172 10*3/uL (ref 150–400)
RBC: 3.6 MIL/uL — ABNORMAL LOW (ref 4.22–5.81)
RDW: 14 % (ref 11.5–15.5)
WBC: 10.5 10*3/uL (ref 4.0–10.5)
nRBC: 0 % (ref 0.0–0.2)

## 2018-11-24 LAB — TROPONIN I (HIGH SENSITIVITY)
Troponin I (High Sensitivity): 37 ng/L — ABNORMAL HIGH (ref <18)
Troponin I (High Sensitivity): 35 ng/L — ABNORMAL HIGH (ref <18)

## 2018-11-24 LAB — ABO/RH: ABO/RH(D): O POS

## 2018-11-24 LAB — D-DIMER, QUANTITATIVE: D-Dimer, Quant: 1.03 ug/mL-FEU — ABNORMAL HIGH (ref 0.00–0.50)

## 2018-11-24 LAB — C-REACTIVE PROTEIN: CRP: 13.1 mg/dL — ABNORMAL HIGH (ref <1.0)

## 2018-11-24 MED ORDER — ADULT MULTIVITAMIN W/MINERALS CH
1.0000 | ORAL_TABLET | Freq: Every day | ORAL | Status: DC
  Administered 2018-11-24 – 2018-12-01 (×7): 1 via ORAL
  Filled 2018-11-24 (×7): qty 1

## 2018-11-24 MED ORDER — ENSURE ENLIVE PO LIQD
237.0000 mL | Freq: Three times a day (TID) | ORAL | Status: DC
  Administered 2018-11-24 – 2018-12-01 (×16): 237 mL via ORAL

## 2018-11-24 MED ORDER — SODIUM CHLORIDE 0.9 % IV SOLN
100.0000 mg | Freq: Every day | INTRAVENOUS | Status: AC
  Administered 2018-11-24 – 2018-11-26 (×3): 100 mg via INTRAVENOUS
  Filled 2018-11-24 (×3): qty 100

## 2018-11-24 MED ORDER — POLYETHYLENE GLYCOL 3350 17 G PO PACK
17.0000 g | PACK | Freq: Two times a day (BID) | ORAL | Status: DC
  Administered 2018-11-24 – 2018-11-25 (×3): 17 g via ORAL
  Filled 2018-11-24 (×3): qty 1

## 2018-11-24 MED ORDER — ENOXAPARIN SODIUM 40 MG/0.4ML ~~LOC~~ SOLN
40.0000 mg | SUBCUTANEOUS | Status: DC
  Administered 2018-11-24 – 2018-11-30 (×7): 40 mg via SUBCUTANEOUS
  Filled 2018-11-24 (×7): qty 0.4

## 2018-11-24 NOTE — Progress Notes (Signed)
[facility] PROGRESS NOTE    Progress Note  Albert [name]  [id number] DOB: April 21, 1933 DOA: 11/23/2018 PCP: Patient, No Pcp Per     Brief Narrative:   Albert [name] is an 85 y.o. male past medical history of essential hypertension seen in the ED for weakness that started 3 days prior to admission accompanied by shortness of breath and unable to get out of bed to consume fluids.  She is a poor historian and most of the history was obtained from the ED notes and EHR.  He reports dizziness upon standing.  At [facility] she was found to have a temperature of 101 blood pressure 102/63 and a pulse of 100 respiratory rate of 16.  CT chest was done that showed groundglass opacity SARS-CoV-2 PCR was positive on 11/22/2018  Assessment/Plan:   Generalized weakness, dehydration due to Pneumonia due to COVID-19 virus Currently satting greater 96% on room air. Vitals are stable.  Inflammatory markers CRP is 16 D-dimer is 1.0. She was started on IV remdesivir and steroids on 11/22/2018 we will continue IVs remdesivir for 5 days. Continue to trend inflammatory markers. Try to keep the patient peripherally 16 hours a day. Follow strict I's and O's.  Dehydration/AKI (acute kidney injury) ([place]) on chronic kidney disease stage III: Going back through care everywhere, her baseline creatinine is around 1.2-1.4. On admission she was 1.6 she was started on IV fluid hydration her creatinine is now 1.2. KVO IV fluids.  Elevated troponin In the setting of chronic renal disease and stress duty infectious etiology unlikely to be an ischemic event.  Benign essential hypertension Blood pressure seems to be well controlled. Continue to hold antihypertensive medication.  DVT prophylaxis: lovenox Family Communication:none Disposition Plan/Barrier to D/C: unable to determine Code Status:     Code Status Orders  (From admission, onward)         Start     Ordered   11/23/18 2240  Full code   Continuous     11/23/18 2241        Code Status History    Date Active Date Inactive Code Status Order ID Comments User Context   11/22/2018 1732 11/23/2018 2212 Full Code [id number]  Para Skeans, MD ED   Advance Care Planning Activity    Advance Directive Documentation     Most Recent Value  Type of Advance Directive  Living will  Pre-existing out of facility DNR order (yellow form or pink MOST form)  --  "MOST" Form in Place?  --        IV Access:    Peripheral IV   Procedures and diagnostic studies:   Ct Head Wo Contrast  Result Date: 11/22/2018 CLINICAL DATA:  Unexplained altered level of consciousness. Weakness and dizziness. Duration of symptoms 3 days. EXAM: CT HEAD WITHOUT CONTRAST TECHNIQUE: Contiguous axial images were obtained from the base of the skull through the vertex without intravenous contrast. COMPARISON:  None. FINDINGS: Brain: Age related volume loss. Mild chronic appearing small vessel change of the hemispheric white matter. No sign of acute infarction, mass lesion, hemorrhage, hydrocephalus or extra-axial collection. Vascular: There is atherosclerotic calcification of the major vessels at the base of the brain. Skull: Negative Sinuses/Orbits: Clear/normal Other: None IMPRESSION: No acute finding. Ordinary mild age related volume loss and small-vessel change of the white matter. Electronically Signed   By: Nelson Chimes M.D.   On: 11/22/2018 13:53   Ct Angio Chest Pe W And/or Wo Contrast  Addendum Date: 11/22/2018  ADDENDUM REPORT: 11/22/2018 16:54 ADDENDUM: Ascending thoracic aortic dilatation to 4.1 cm. Recommend annual imaging followup by CTA or MRA. This recommendation follows 2010 ACCF/AHA/AATS/ACR/ASA/SCA/SCAI/SIR/STS/SVM Guidelines for the Diagnosis and Management of Patients with Thoracic Aortic Disease. Circulation. 2010; [id number]. Aortic aneurysm NOS (ICD10-I71.9) This addendum was called by telephone at the time of dictation on 11/22/2018 at  4:53 pm to provider Dr. Alford Highland, Who verbally acknowledged these results. Electronically Signed   By: Lovena Le M.D.   On: 11/22/2018 16:54   Result Date: 11/22/2018 CLINICAL DATA:  Chest pain, complex, high probability of ACS/PE/acute aortic syndrome EXAM: CT ANGIOGRAPHY CHEST WITH CONTRAST TECHNIQUE: Multidetector CT imaging of the chest was performed using the standard protocol during bolus administration of intravenous contrast. Multiplanar CT image reconstructions and MIPs were obtained to evaluate the vascular anatomy. CONTRAST:  68mL OMNIPAQUE IOHEXOL 350 MG/ML SOLN COMPARISON:  Chest radiograph 11/22/2018 FINDINGS: Cardiovascular: Motion artifact limits evaluation of the pulmonary arteries beyond the the lobar level. No central or lobar filling defects are seen. Central pulmonary arteries are normal caliber. Mild cardiomegaly. Extensive coronary artery calcifications are noted. Dense mitral annular calcification is seen as well. No pericardial effusion. Borderline dilation of the ascending thoracic aorta 4.1 cm. Atherosclerotic calcifications are present throughout the thoracic aorta and proximal great vessels. Normal 3 vessel branching of the aortic arch. Mediastinum/Nodes: Scattered low-attenuation borderline enlarged mediastinal and hilar nodes are present including a 11 mm subcarinal lymph node (4/43) and 11 mm right hilar lymph node (4/42). No acute abnormality of the trachea. Posterior bowing compatible with imaging during exhalation. Slightly heterogeneous appearance of the thyroid gland with few punctate calcifications in several hypoattenuating nodules with slight enlargement of the left lobe. Small hiatal hernia. No acute esophageal abnormality. Lungs/Pleura: There are multifocal areas of ground-glass attenuation with an appearance conspicuous for underlying infection or developing consolidation superimposed on areas of atelectasis. There is diffuse airways thickening. No suspicious nodules  or masses. No pneumothorax or effusion. Upper Abdomen: No acute abnormalities present in the visualized portions of the upper abdomen. Punctate benign-appearing calcification head Musculoskeletal: Exaggeration of the normal thoracic kyphosis with multilevel bridging syndesmophytes across the spine suggesting some degree of ankylosis. No acute or suspicious osseous lesions are seen. There is an increased AP diameter of the chest associated with the exaggerated curvature. Additional discogenic changes are present in the cervical spine as well. Review of the MIP images confirms the above findings. IMPRESSION: 1. Motion artifact limits evaluation of the pulmonary arteries beyond the lobar level. No central or lobar filling defects are seen. 2. Multifocal areas of ground-glass attenuation with an appearance conspicuous for underlying infection or developing consolidation superimposed on areas of atelectasis. 3. Scattered borderline enlarged mediastinal and hilar nodes, nonspecific, but likely reactive. 4. Exaggerated thoracic kyphosis and bridging syndesmophyte formation conspicuous for ankylosing spondylitis. 5. Aortic Atherosclerosis (ICD10-I70.0). Electronically Signed: By: Lovena Le M.D. On: 11/22/2018 14:46   Dg Chest Portable 1 View  Result Date: 11/22/2018 CLINICAL DATA:  Shortness of breath EXAM: PORTABLE CHEST 1 VIEW COMPARISON:  None. FINDINGS: There is apparent scarring in the right mid and lower lung zones. There is bibasilar atelectasis. There is no edema or consolidation. Heart size and pulmonary vascularity are normal. No adenopathy. There is aortic atherosclerosis. There is degenerative change in the thoracic spine. IMPRESSION: No frank edema or consolidation. Bibasilar atelectasis with apparent scarring in the right mid lower lung zones. Heart size within normal limits. No adenopathy. Aortic Atherosclerosis (ICD10-I70.0). Electronically Signed   By: Gwyndolyn Saxon  Jasmine December III M.D.   On: 11/22/2018  13:24     Medical Consultants:    None.  Anti-Infectives:   IV remdesivir  Subjective:    Albert [name] relates still feels short of breath.  Objective:    Vitals:   11/23/18 2200 11/24/18 0404 11/24/18 0758  BP: 132/65 104/62 96/62  Pulse: 74 78 65  Resp: 15 15 20   Temp: 97.6 F (36.4 C) 98.2 F (36.8 C) 97.7 F (36.5 C)  TempSrc: Oral Oral Oral  SpO2: 99% 97% 98%  Weight: 60.4 kg    Height: 5\' 9"  (1.753 m)     SpO2: 98 %   Intake/Output Summary (Last 24 hours) at 11/24/2018 0955 Last data filed at 11/24/2018 0407 Gross per 24 hour  Intake 490 ml  Output 600 ml  Net -110 ml   Filed Weights   11/23/18 2200  Weight: 60.4 kg    Exam: General exam: In no acute distress. Respiratory system: Good air movement and clear to auscultation. Cardiovascular system: S1 & S2 heard, RRR. No JVD. Gastrointestinal system: Abdomen is nondistended, soft and nontender.  Central nervous system: Alert and oriented. No focal neurological deficits. Extremities: No pedal edema. Skin: No rashes, lesions or ulcers Psychiatry: Judgement and insight appear normal. Mood & affect appropriate.   Data Reviewed:    Labs: Basic Metabolic Panel: Recent Labs  Lab 11/22/18 1251 11/22/18 1912 11/23/18 0533 11/24/18 0110  NA 134*  --  135 135  K 3.6  --  3.4* 3.5  CL 95*  --  96* 102  CO2 23  --  22 19*  GLUCOSE 133*  --  152* 125*  BUN 73*  --  73* 79*  CREATININE 1.78* 1.58* 1.61* 1.25*  CALCIUM 8.7*  --  8.0* 7.8*  MG 2.1 1.8 2.0  --   PHOS  --  2.7 4.1  --    GFR Estimated Creatinine Clearance: 38.3 mL/min (A) (by C-G formula based on SCr of 1.25 mg/dL (H)). Liver Function Tests: Recent Labs  Lab 11/22/18 1251 11/22/18 1912 11/23/18 0533 11/24/18 0110  AST 37 31 35 39  ALT 11 11 9 14   ALKPHOS 75 57 57 65  BILITOT 2.3* 1.6* 1.2 0.9  PROT 7.4 5.8* 6.0* 5.3*  ALBUMIN 3.1* 2.5* 2.4* 2.2*   No results for input(s): LIPASE, AMYLASE in the last 168 hours. No  results for input(s): AMMONIA in the last 168 hours. Coagulation profile No results for input(s): INR, PROTIME in the last 168 hours. COVID-19 Labs  Recent Labs    11/22/18 2300 11/23/18 0533 11/24/18 0110  DDIMER  --   --  1.03*  LDH  --  259*  --   CRP 17.1* 16.7* 13.1*    Lab Results  Component Value Date   SARSCOV2NAA POSITIVE (A) 11/22/2018    CBC: Recent Labs  Lab 11/22/18 1251 11/22/18 1912 11/23/18 0533 11/24/18 0110  WBC 10.4 10.6* 8.1 10.5  NEUTROABS 9.1*  --  7.3 9.1*  HGB 14.2 11.5* 10.9* 10.3*  HCT 40.5 32.6* 31.3* 29.6*  MCV 79.7* 80.1 81.1 82.2  PLT 190 165 145* 172   Cardiac Enzymes: Recent Labs  Lab 11/22/18 1251 11/23/18 0533  CKTOTAL 65 128   BNP (last 3 results) No results for input(s): PROBNP in the last 8760 hours. CBG: No results for input(s): GLUCAP in the last 168 hours. D-Dimer: Recent Labs    11/24/18 0110  DDIMER 1.03*   Hgb A1c: No results for input(s):  HGBA1C in the last 72 hours. Lipid Profile: No results for input(s): CHOL, HDL, LDLCALC, TRIG, CHOLHDL, LDLDIRECT in the last 72 hours. Thyroid function studies: Recent Labs    11/22/18 1912  TSH 0.126*   Anemia work up: No results for input(s): VITAMINB12, FOLATE, FERRITIN, TIBC, IRON, RETICCTPCT in the last 72 hours. Sepsis Labs: Recent Labs  Lab 11/22/18 1251 11/22/18 1912 11/22/18 2047 11/23/18 0533 11/24/18 0110  PROCALCITON  --   --   --  0.38  --   WBC 10.4 10.6*  --  8.1 10.5  LATICACIDVEN  --  1.3 1.5  --   --    Microbiology Recent Results (from the past 240 hour(s))  SARS CORONAVIRUS 2 (TAT 6-24 HRS) Nasopharyngeal Nasopharyngeal Swab     Status: Abnormal   Collection Time: 11/22/18  1:53 PM   Specimen: Nasopharyngeal Swab  Result Value Ref Range Status   SARS Coronavirus 2 POSITIVE (A) NEGATIVE Final    Comment: (NOTE) SARS-CoV-2 target nucleic acids are DETECTED. The SARS-CoV-2 RNA is generally detectable in upper and lower respiratory  specimens during the acute phase of infection. Positive results are indicative of active infection with SARS-CoV-2. Clinical  correlation with patient history and other diagnostic information is necessary to determine patient infection status. Positive results do  not rule out bacterial infection or co-infection with other viruses. The expected result is Negative. Fact Sheet for Patients: SugarRoll.be Fact Sheet for Healthcare Providers: https://www.woods-mathews.com/ This test is not yet approved or cleared by the [place] FDA and  has been authorized for detection and/or diagnosis of SARS-CoV-2 by FDA under an Emergency Use Authorization (EUA). This EUA will remain  in effect (meaning this test can be used) for the duration of the COVID-19 declaration under Section 564(b)(1) of the Act, 21 U.S.C.  section 360bbb-3(b)(1), unless the authorization is terminated or revoked sooner. Performed at [facility], [place] [address] [place], [place] [address]   Blood culture (routine x 2)     Status: None (Preliminary result)   Collection Time: 11/22/18  5:49 PM   Specimen: BLOOD  Result Value Ref Range Status   Specimen Description BLOOD RIGHT ANTECUBITAL  Final   Special Requests   Final    BOTTLES DRAWN AEROBIC AND ANAEROBIC Blood Culture adequate volume   Culture   Final    NO GROWTH 2 DAYS Performed at [facility], [address], [place], [place] [address]    Report Status PENDING  Incomplete  Blood culture (routine x 2)     Status: None (Preliminary result)   Collection Time: 11/22/18  5:49 PM   Specimen: BLOOD  Result Value Ref Range Status   Specimen Description BLOOD BLOOD LEFT ARM  Final   Special Requests   Final    BOTTLES DRAWN AEROBIC AND ANAEROBIC Blood Culture results may not be optimal due to an excessive volume of blood received in culture bottles   Culture   Final    NO GROWTH 2 DAYS Performed at [facility], [address] [place], [place] [address]    Report Status PENDING  Incomplete     Medications:    dexamethasone (DECADRON) injection  6 mg Intravenous Q24H   enoxaparin (LOVENOX) injection  30 mg Subcutaneous Q24H   sodium chloride flush  3 mL Intravenous Q12H   vitamin C  500 mg Oral Daily   zinc sulfate  220 mg Oral Daily   Continuous Infusions:  sodium chloride     [  START ON 11/25/2018] remdesivir 100 mg in NS 250 mL        LOS: 1 day   Charlynne Cousins  [facility]  11/24/2018, 9:55 AM

## 2018-11-24 NOTE — Progress Notes (Signed)
Initial Nutrition Assessment  DOCUMENTATION CODES:   Not applicable  INTERVENTION:    Ensure Enlive po TID, each supplement provides 350 kcal and 20 grams of protein.  Liberalize diet to regular.  MVI with minerals daily  Pt receiving Hormel Shake daily with Breakfast which provides 520 kcals and 22 g of protein and Magic cup BID with lunch and dinner, each supplement provides 290 kcal and 9 grams of protein, automatically on meal trays to optimize nutritional intake.   NUTRITION DIAGNOSIS:   Increased nutrient needs related to acute illness(COVID PNA) as evidenced by estimated needs.  GOAL:   Patient will meet greater than or equal to 90% of their needs  MONITOR:   PO intake, Supplement acceptance, Labs, Skin  REASON FOR ASSESSMENT:   Malnutrition Screening Tool    ASSESSMENT:   85 yo male admitted with COVID 19 PNA. PMH includes HTN.   On malnutrition screening tool assessment, patient reported poor intake with resultant weight loss PTA.    No recent weights available for review. Currently 83% of ideal weight. Suspect malnutrition. Unable to obtain enough information at this time for identification of malnutrition.   Currently on a heart healthy diet. No meal completions documented.  Labs reviewed.  Medications reviewed and include decadron, vitamin C, zinc.  Patient would benefit from PO supplements to ensure adequate intake to meet increased nutrition needs for COVID-19.  NUTRITION - FOCUSED PHYSICAL EXAM:  deferred  Diet Order:   Diet Order            Diet Heart Room service appropriate? Yes; Fluid consistency: Thin  Diet effective now              EDUCATION NEEDS:   Not appropriate for education at this time  Skin:  Skin Assessment: Reviewed RN Assessment(MASD to buttocks, scrotum)  Last BM:  11/11  Height:   Ht Readings from Last 1 Encounters:  11/23/18 5\' 9"  (1.753 m)    Weight:   Wt Readings from Last 1 Encounters:  11/23/18  60.4 kg    Ideal Body Weight:  72.7 kg  BMI:  Body mass index is 19.66 kg/m.  Estimated Nutritional Needs:   Kcal:  [id number]  Protein:  85-100 gm  Fluid:  >/= 1.8 L    Molli Barrows, RD, LDN, [place] Pager [phone] After Hours Pager [phone]

## 2018-11-25 LAB — CBC WITH DIFFERENTIAL/PLATELET
Abs Immature Granulocytes: 0.15 10*3/uL — ABNORMAL HIGH (ref 0.00–0.07)
Basophils Absolute: 0 10*3/uL (ref 0.0–0.1)
Basophils Relative: 0 %
Eosinophils Absolute: 0 10*3/uL (ref 0.0–0.5)
Eosinophils Relative: 0 %
HCT: 27.8 % — ABNORMAL LOW (ref 39.0–52.0)
Hemoglobin: 9.4 g/dL — ABNORMAL LOW (ref 13.0–17.0)
Immature Granulocytes: 2 %
Lymphocytes Relative: 11 %
Lymphs Abs: 1.1 10*3/uL (ref 0.7–4.0)
MCH: 28.2 pg (ref 26.0–34.0)
MCHC: 33.8 g/dL (ref 30.0–36.0)
MCV: 83.5 fL (ref 80.0–100.0)
Monocytes Absolute: 0.4 10*3/uL (ref 0.1–1.0)
Monocytes Relative: 4 %
Neutro Abs: 8.4 10*3/uL — ABNORMAL HIGH (ref 1.7–7.7)
Neutrophils Relative %: 83 %
Platelets: 190 10*3/uL (ref 150–400)
RBC: 3.33 MIL/uL — ABNORMAL LOW (ref 4.22–5.81)
RDW: 14.1 % (ref 11.5–15.5)
WBC: 10.1 10*3/uL (ref 4.0–10.5)
nRBC: 0 % (ref 0.0–0.2)

## 2018-11-25 LAB — COMPREHENSIVE METABOLIC PANEL
ALT: 14 U/L (ref 0–44)
AST: 40 U/L (ref 15–41)
Albumin: 2.2 g/dL — ABNORMAL LOW (ref 3.5–5.0)
Alkaline Phosphatase: 62 U/L (ref 38–126)
Anion gap: 12 (ref 5–15)
BUN: 74 mg/dL — ABNORMAL HIGH (ref 8–23)
CO2: 22 mmol/L (ref 22–32)
Calcium: 7.9 mg/dL — ABNORMAL LOW (ref 8.9–10.3)
Chloride: 104 mmol/L (ref 98–111)
Creatinine, Ser: 1.23 mg/dL (ref 0.61–1.24)
GFR calc Af Amer: >60 mL/min (ref >60)
GFR calc non Af Amer: 54 mL/min — ABNORMAL LOW (ref >60)
Glucose, Bld: 123 mg/dL — ABNORMAL HIGH (ref 70–99)
Potassium: 4.1 mmol/L (ref 3.5–5.1)
Sodium: 138 mmol/L (ref 135–145)
Total Bilirubin: 0.9 mg/dL (ref 0.3–1.2)
Total Protein: 5.2 g/dL — ABNORMAL LOW (ref 6.5–8.1)

## 2018-11-25 LAB — D-DIMER, QUANTITATIVE: D-Dimer, Quant: 1.02 ug/mL-FEU — ABNORMAL HIGH (ref 0.00–0.50)

## 2018-11-25 LAB — C-REACTIVE PROTEIN: CRP: 7.2 mg/dL — ABNORMAL HIGH (ref <1.0)

## 2018-11-25 NOTE — Progress Notes (Signed)
Discussed care with patient's next of kin, Carlyon Shadow.  Discussed concerns with patient going home and being able to do ADL's on own.  Discussed concerns of going to rehab once he leaves the hospital. Will address concerns with overnight RN to address with physician tomorrow.

## 2018-11-25 NOTE — Progress Notes (Signed)
[facility] PROGRESS NOTE    Progress Note  Albert [name]  [id number] DOB: Oct 18, 1933 DOA: 11/23/2018 PCP: Patient, No Pcp Per     Brief Narrative:   Albert [name] is an 85 y.o. male past medical history of essential hypertension seen in the ED for weakness that started 3 days prior to admission accompanied by shortness of breath and unable to get out of bed to consume fluids.  She is a poor historian and most of the history was obtained from the ED notes and EHR.  He reports dizziness upon standing.  At [facility] she was found to have a temperature of 101 blood pressure 102/63 and a pulse of 100 respiratory rate of 16.  CT chest was done that showed groundglass opacity SARS-CoV-2 PCR was positive on 11/22/2018  Assessment/Plan:   Generalized weakness, dehydration due to Pneumonia due to COVID-19 virus Currently satting greater 96% on room air. His inflammatory markers are improving nicely. Continue IV remdesivir steroids vitamin C and zinc for total of 5 days. Follow strict I's and O's Daily weights. Try to keep the patient peripherally 16 hours a day.  Dehydration/AKI (acute kidney injury) ([place]) on chronic kidney disease stage III: With a baseline creatinine of 1.2, on admission 1.6 likely prerenal azotemia he was started on IV fluid hydration his creatinine has improved to baseline.  KVO IV fluids.  Elevated troponin In the setting of chronic renal disease and stress duty infectious etiology unlikely to be an ischemic event.  Benign essential hypertension Blood pressure seems to be well controlled. Continue to hold antihypertensive medication.  DVT prophylaxis: lovenox Family Communication:none Disposition Plan/Barrier to D/C: unable to determine Code Status:     Code Status Orders  (From admission, onward)         Start     Ordered   11/23/18 2240  Full code  Continuous     11/23/18 2241        Code Status History    Date Active Date Inactive Code Status  Order ID Comments User Context   11/22/2018 1732 11/23/2018 2212 Full Code [id number]  Para Skeans, MD ED   Advance Care Planning Activity    Advance Directive Documentation     Most Recent Value  Type of Advance Directive  Living will  Pre-existing out of facility DNR order (yellow form or pink MOST form)  -  "MOST" Form in Place?  -        IV Access:    Peripheral IV   Procedures and diagnostic studies:   No results found.   Medical Consultants:    None.  Anti-Infectives:   IV remdesivir  Subjective:    Albert [name] relates his breathing is improved he is now off oxygen.  Objective:    Vitals:   11/24/18 0758 11/24/18 1630 11/24/18 2220 11/25/18 0545  BP: 96/62 99/66 (!) 99/58 105/61  Pulse: 65 81 64 (!) 58  Resp: 20 16 20 18   Temp: 97.7 F (36.5 C) 97.6 F (36.4 C) 98.4 F (36.9 C) 97.6 F (36.4 C)  TempSrc: Oral Oral Axillary Oral  SpO2: 98% 98% 99% 98%  Weight:      Height:       SpO2: 98 %   Intake/Output Summary (Last 24 hours) at 11/25/2018 0744 Last data filed at 11/25/2018 0549 Gross per 24 hour  Intake 490 ml  Output 1240 ml  Net -750 ml   Filed Weights   11/23/18 2200  Weight: 60.4  kg    Exam: General exam: In no acute distress. Respiratory system: Good air movement and clear to auscultation. Cardiovascular system: S1 & S2 heard, RRR. No JVD. Gastrointestinal system: Abdomen is nondistended, soft and nontender.  Central nervous system: Alert and oriented. No focal neurological deficits. Extremities: No pedal edema. Skin: No rashes, lesions or ulcers  Data Reviewed:    Labs: Basic Metabolic Panel: Recent Labs  Lab 11/22/18 1251 11/22/18 1912 11/23/18 0533 11/24/18 0110 11/25/18 0106  NA 134*  --  135 135 138  K 3.6  --  3.4* 3.5 4.1  CL 95*  --  96* 102 104  CO2 23  --  22 19* 22  GLUCOSE 133*  --  152* 125* 123*  BUN 73*  --  73* 79* 74*  CREATININE 1.78* 1.58* 1.61* 1.25* 1.23  CALCIUM 8.7*  --  8.0*  7.8* 7.9*  MG 2.1 1.8 2.0  --   --   PHOS  --  2.7 4.1  --   --    GFR Estimated Creatinine Clearance: 38.9 mL/min (by C-G formula based on SCr of 1.23 mg/dL). Liver Function Tests: Recent Labs  Lab 11/22/18 1251 11/22/18 1912 11/23/18 0533 11/24/18 0110 11/25/18 0106  AST 37 31 35 39 40  ALT 11 11 9 14 14   ALKPHOS 75 57 57 65 62  BILITOT 2.3* 1.6* 1.2 0.9 0.9  PROT 7.4 5.8* 6.0* 5.3* 5.2*  ALBUMIN 3.1* 2.5* 2.4* 2.2* 2.2*   No results for input(s): LIPASE, AMYLASE in the last 168 hours. No results for input(s): AMMONIA in the last 168 hours. Coagulation profile No results for input(s): INR, PROTIME in the last 168 hours. COVID-19 Labs  Recent Labs    11/23/18 0533 11/24/18 0110 11/25/18 0106  DDIMER  --  1.03* 1.02*  LDH 259*  --   --   CRP 16.7* 13.1* 7.2*    Lab Results  Component Value Date   SARSCOV2NAA POSITIVE (A) 11/22/2018    CBC: Recent Labs  Lab 11/22/18 1251 11/22/18 1912 11/23/18 0533 11/24/18 0110 11/25/18 0106  WBC 10.4 10.6* 8.1 10.5 10.1  NEUTROABS 9.1*  --  7.3 9.1* 8.4*  HGB 14.2 11.5* 10.9* 10.3* 9.4*  HCT 40.5 32.6* 31.3* 29.6* 27.8*  MCV 79.7* 80.1 81.1 82.2 83.5  PLT 190 165 145* 172 190   Cardiac Enzymes: Recent Labs  Lab 11/22/18 1251 11/23/18 0533  CKTOTAL 65 128   BNP (last 3 results) No results for input(s): PROBNP in the last 8760 hours. CBG: No results for input(s): GLUCAP in the last 168 hours. D-Dimer: Recent Labs    11/24/18 0110 11/25/18 0106  DDIMER 1.03* 1.02*   Hgb A1c: No results for input(s): HGBA1C in the last 72 hours. Lipid Profile: No results for input(s): CHOL, HDL, LDLCALC, TRIG, CHOLHDL, LDLDIRECT in the last 72 hours. Thyroid function studies: Recent Labs    11/22/18 1912  TSH 0.126*   Anemia work up: No results for input(s): VITAMINB12, FOLATE, FERRITIN, TIBC, IRON, RETICCTPCT in the last 72 hours. Sepsis Labs: Recent Labs  Lab 11/22/18 1912 11/22/18 2047 11/23/18 0533 11/24/18  0110 11/25/18 0106  PROCALCITON  --   --  0.38  --   --   WBC 10.6*  --  8.1 10.5 10.1  LATICACIDVEN 1.3 1.5  --   --   --    Microbiology Recent Results (from the past 240 hour(s))  SARS CORONAVIRUS 2 (TAT 6-24 HRS) Nasopharyngeal Nasopharyngeal Swab  Status: Abnormal   Collection Time: 11/22/18  1:53 PM   Specimen: Nasopharyngeal Swab  Result Value Ref Range Status   SARS Coronavirus 2 POSITIVE (A) NEGATIVE Final    Comment: (NOTE) SARS-CoV-2 target nucleic acids are DETECTED. The SARS-CoV-2 RNA is generally detectable in upper and lower respiratory specimens during the acute phase of infection. Positive results are indicative of active infection with SARS-CoV-2. Clinical  correlation with patient history and other diagnostic information is necessary to determine patient infection status. Positive results do  not rule out bacterial infection or co-infection with other viruses. The expected result is Negative. Fact Sheet for Patients: SugarRoll.be Fact Sheet for Healthcare Providers: https://www.woods-mathews.com/ This test is not yet approved or cleared by the [place] FDA and  has been authorized for detection and/or diagnosis of SARS-CoV-2 by FDA under an Emergency Use Authorization (EUA). This EUA will remain  in effect (meaning this test can be used) for the duration of the COVID-19 declaration under Section 564(b)(1) of the Act, 21 U.S.C.  section 360bbb-3(b)(1), unless the authorization is terminated or revoked sooner. Performed at [facility], [place] [address], [place], [place] [address]   Blood culture (routine x 2)     Status: None (Preliminary result)   Collection Time: 11/22/18  5:49 PM   Specimen: BLOOD  Result Value Ref Range Status   Specimen Description BLOOD RIGHT ANTECUBITAL  Final   Special Requests   Final    BOTTLES DRAWN AEROBIC AND ANAEROBIC Blood Culture adequate volume   Culture   Final    NO  GROWTH 3 DAYS Performed at [facility], [address] [place], [place] [address]    Report Status PENDING  Incomplete  Blood culture (routine x 2)     Status: None (Preliminary result)   Collection Time: 11/22/18  5:49 PM   Specimen: BLOOD  Result Value Ref Range Status   Specimen Description BLOOD BLOOD LEFT ARM  Final   Special Requests   Final    BOTTLES DRAWN AEROBIC AND ANAEROBIC Blood Culture results may not be optimal due to an excessive volume of blood received in culture bottles   Culture   Final    NO GROWTH 3 DAYS Performed at [facility], [address], [place], [place] [address]    Report Status PENDING  Incomplete     Medications:   . dexamethasone (DECADRON) injection  6 mg Intravenous Q24H  . enoxaparin (LOVENOX) injection  40 mg Subcutaneous Q24H  . feeding supplement (ENSURE ENLIVE)  237 mL Oral TID BM  . multivitamin with minerals  1 tablet Oral Daily  . polyethylene glycol  17 g Oral BID  . sodium chloride flush  3 mL Intravenous Q12H  . vitamin C  500 mg Oral Daily  . zinc sulfate  220 mg Oral Daily   Continuous Infusions: . sodium chloride    . remdesivir 100 mg in NS 250 mL Stopped (11/24/18 1710)      LOS: 2 days   Albert [name]  [facility]  11/25/2018, 7:44 AM

## 2018-11-26 LAB — COMPREHENSIVE METABOLIC PANEL
ALT: 23 U/L (ref 0–44)
AST: 60 U/L — ABNORMAL HIGH (ref 15–41)
Albumin: 2.2 g/dL — ABNORMAL LOW (ref 3.5–5.0)
Alkaline Phosphatase: 67 U/L (ref 38–126)
Anion gap: 11 (ref 5–15)
BUN: 66 mg/dL — ABNORMAL HIGH (ref 8–23)
CO2: 22 mmol/L (ref 22–32)
Calcium: 8.1 mg/dL — ABNORMAL LOW (ref 8.9–10.3)
Chloride: 107 mmol/L (ref 98–111)
Creatinine, Ser: 1.14 mg/dL (ref 0.61–1.24)
GFR calc Af Amer: >60 mL/min (ref >60)
GFR calc non Af Amer: 59 mL/min — ABNORMAL LOW (ref >60)
Glucose, Bld: 187 mg/dL — ABNORMAL HIGH (ref 70–99)
Potassium: 4.7 mmol/L (ref 3.5–5.1)
Sodium: 140 mmol/L (ref 135–145)
Total Bilirubin: 0.7 mg/dL (ref 0.3–1.2)
Total Protein: 5.1 g/dL — ABNORMAL LOW (ref 6.5–8.1)

## 2018-11-26 LAB — CBC WITH DIFFERENTIAL/PLATELET
Abs Immature Granulocytes: 0.32 10*3/uL — ABNORMAL HIGH (ref 0.00–0.07)
Basophils Absolute: 0 10*3/uL (ref 0.0–0.1)
Basophils Relative: 0 %
Eosinophils Absolute: 0 10*3/uL (ref 0.0–0.5)
Eosinophils Relative: 0 %
HCT: 28.2 % — ABNORMAL LOW (ref 39.0–52.0)
Hemoglobin: 9.6 g/dL — ABNORMAL LOW (ref 13.0–17.0)
Immature Granulocytes: 3 %
Lymphocytes Relative: 13 %
Lymphs Abs: 1.4 10*3/uL (ref 0.7–4.0)
MCH: 28.9 pg (ref 26.0–34.0)
MCHC: 34 g/dL (ref 30.0–36.0)
MCV: 84.9 fL (ref 80.0–100.0)
Monocytes Absolute: 0.5 10*3/uL (ref 0.1–1.0)
Monocytes Relative: 4 %
Neutro Abs: 8.7 10*3/uL — ABNORMAL HIGH (ref 1.7–7.7)
Neutrophils Relative %: 80 %
Platelets: 200 10*3/uL (ref 150–400)
RBC: 3.32 MIL/uL — ABNORMAL LOW (ref 4.22–5.81)
RDW: 14 % (ref 11.5–15.5)
WBC: 11 10*3/uL — ABNORMAL HIGH (ref 4.0–10.5)
nRBC: 0.2 % (ref 0.0–0.2)

## 2018-11-26 LAB — HEMOGLOBIN A1C
Hgb A1c MFr Bld: 5.1 % (ref 4.8–5.6)
Mean Plasma Glucose: 99.67 mg/dL

## 2018-11-26 LAB — GLUCOSE, CAPILLARY
Glucose-Capillary: 152 mg/dL — ABNORMAL HIGH (ref 70–99)
Glucose-Capillary: 93 mg/dL (ref 70–99)
Glucose-Capillary: 99 mg/dL (ref 70–99)

## 2018-11-26 LAB — D-DIMER, QUANTITATIVE: D-Dimer, Quant: 1.23 ug/mL-FEU — ABNORMAL HIGH (ref 0.00–0.50)

## 2018-11-26 LAB — C-REACTIVE PROTEIN: CRP: 4.2 mg/dL — ABNORMAL HIGH (ref <1.0)

## 2018-11-26 MED ORDER — INSULIN DETEMIR 100 UNIT/ML ~~LOC~~ SOLN
5.0000 [IU] | Freq: Every day | SUBCUTANEOUS | Status: DC
  Administered 2018-11-26 – 2018-11-30 (×5): 5 [IU] via SUBCUTANEOUS
  Filled 2018-11-26 (×6): qty 0.05

## 2018-11-26 MED ORDER — INSULIN ASPART 100 UNIT/ML ~~LOC~~ SOLN
2.0000 [IU] | Freq: Three times a day (TID) | SUBCUTANEOUS | Status: DC
  Administered 2018-11-26 – 2018-12-01 (×8): 2 [IU] via SUBCUTANEOUS

## 2018-11-26 MED ORDER — INSULIN ASPART 100 UNIT/ML ~~LOC~~ SOLN
0.0000 [IU] | Freq: Three times a day (TID) | SUBCUTANEOUS | Status: DC
  Administered 2018-11-26 – 2018-11-30 (×2): 1 [IU] via SUBCUTANEOUS

## 2018-11-26 NOTE — Progress Notes (Signed)
[facility] PROGRESS NOTE    Progress Note  Albert [name]  [id number] DOB: August 13, 1933 DOA: 11/23/2018 PCP: Patient, No Pcp Per     Brief Narrative:   Albert [name] is an 85 y.o. male past medical history of essential hypertension seen in the ED for weakness that started 3 days prior to admission accompanied by shortness of breath and unable to get out of bed to consume fluids.  She is a poor historian and most of the history was obtained from the ED notes and EHR.  He reports dizziness upon standing.  At [facility] she was found to have a temperature of 101 blood pressure 102/63 and a pulse of 100 respiratory rate of 16.  CT chest was done that showed groundglass opacity SARS-CoV-2 PCR was positive on 11/22/2018  Assessment/Plan:   Generalized weakness, dehydration due to Pneumonia due to COVID-19 virus Currently satting greater 95% on room air. Inflammatory markers are improving nicely. Continue IV remdesivir, steroids, vitamin C and zinc. Follow strict I's and O's and daily weights. We will try to keep the patient prone for early 16 hours today.  Dehydration/AKI (acute kidney injury) ([place]) on chronic kidney disease stage III: With a baseline creatinine of around 1.2. On admission 1.6 likely prerenal azotemia. He was started on IV fluid hydration his creatinine returned to baseline.  Elevated troponin In the setting of chronic renal disease and stress due to infectious etiology unlikely to be an ischemic event.  Benign essential hypertension Blood pressure seems to be well controlled. Continue to hold antihypertensive medication.  [place] stool: He was initially constipated he relate he had had 6 days without a bowel movement he was placed on MiraLAX which is likely the cause of his watery stools. We will stop MiraLAX.  Continue to follow stool output.  DVT prophylaxis: lovenox Family Communication:none Disposition Plan/Barrier to D/C: unable to determine Code  Status:     Code Status Orders  (From admission, onward)         Start     Ordered   11/23/18 2240  Full code  Continuous     11/23/18 2241        Code Status History    Date Active Date Inactive Code Status Order ID Comments User Context   11/22/2018 1732 11/23/2018 2212 Full Code [id number]  Para Skeans, MD ED   Advance Care Planning Activity    Advance Directive Documentation     Most Recent Value  Type of Advance Directive  Living will  Pre-existing out of facility DNR order (yellow form or pink MOST form)  -  "MOST" Form in Place?  -        IV Access:    Peripheral IV   Procedures and diagnostic studies:   No results found.   Medical Consultants:    None.  Anti-Infectives:   IV remdesivir  Subjective:    Albert [name] he denies any chest pain or shortness of breath he relates his breathing has improved significantly. He did have some watery stool.  Objective:    Vitals:   11/25/18 0830 11/25/18 1600 11/25/18 1700 11/26/18 0500  BP: 106/60  (!) 107/58 106/72  Pulse: 89  90 72  Resp: 18   19  Temp: 98 F (36.7 C) 98 F (36.7 C)  98.6 F (37 C)  TempSrc: Oral Oral  Oral  SpO2: 98%  99% 97%  Weight:      Height:  SpO2: 97 %   Intake/Output Summary (Last 24 hours) at 11/26/2018 0745 Last data filed at 11/26/2018 0502 Gross per 24 hour  Intake 1070 ml  Output 975 ml  Net 95 ml   Filed Weights   11/23/18 2200  Weight: 60.4 kg    Exam: General exam: In no acute distress. Respiratory system: Good air movement and diffuse crackles mainly on the left side. Cardiovascular system: S1 & S2 heard, RRR. No JVD,. Gastrointestinal system: Abdomen is nondistended, soft and nontender.  Central nervous system: Alert and oriented. No focal neurological deficits. Extremities: No pedal edema. Skin: No rashes, lesions or ulcers Psychiatry: Judgement and insight appear normal. Mood & affect appropriate.   Data Reviewed:    Labs:  Basic Metabolic Panel: Recent Labs  Lab 11/22/18 1251 11/22/18 1912 11/23/18 0533 11/24/18 0110 11/25/18 0106 11/26/18 0125  NA 134*  --  135 135 138 140  K 3.6  --  3.4* 3.5 4.1 4.7  CL 95*  --  96* 102 104 107  CO2 23  --  22 19* 22 22  GLUCOSE 133*  --  152* 125* 123* 187*  BUN 73*  --  73* 79* 74* 66*  CREATININE 1.78* 1.58* 1.61* 1.25* 1.23 1.14  CALCIUM 8.7*  --  8.0* 7.8* 7.9* 8.1*  MG 2.1 1.8 2.0  --   --   --   PHOS  --  2.7 4.1  --   --   --    GFR Estimated Creatinine Clearance: 41.9 mL/min (by C-G formula based on SCr of 1.14 mg/dL). Liver Function Tests: Recent Labs  Lab 11/22/18 1912 11/23/18 0533 11/24/18 0110 11/25/18 0106 11/26/18 0125  AST 31 35 39 40 60*  ALT 11 9 14 14 23   ALKPHOS 57 57 65 62 67  BILITOT 1.6* 1.2 0.9 0.9 0.7  PROT 5.8* 6.0* 5.3* 5.2* 5.1*  ALBUMIN 2.5* 2.4* 2.2* 2.2* 2.2*   No results for input(s): LIPASE, AMYLASE in the last 168 hours. No results for input(s): AMMONIA in the last 168 hours. Coagulation profile No results for input(s): INR, PROTIME in the last 168 hours. COVID-19 Labs  Recent Labs    11/24/18 0110 11/25/18 0106 11/26/18 0125  DDIMER 1.03* 1.02* 1.23*  CRP 13.1* 7.2* 4.2*    Lab Results  Component Value Date   SARSCOV2NAA POSITIVE (A) 11/22/2018    CBC: Recent Labs  Lab 11/22/18 1251 11/22/18 1912 11/23/18 0533 11/24/18 0110 11/25/18 0106 11/26/18 0125  WBC 10.4 10.6* 8.1 10.5 10.1 11.0*  NEUTROABS 9.1*  --  7.3 9.1* 8.4* PENDING  HGB 14.2 11.5* 10.9* 10.3* 9.4* 9.6*  HCT 40.5 32.6* 31.3* 29.6* 27.8* 28.2*  MCV 79.7* 80.1 81.1 82.2 83.5 84.9  PLT 190 165 145* 172 190 200   Cardiac Enzymes: Recent Labs  Lab 11/22/18 1251 11/23/18 0533  CKTOTAL 65 128   BNP (last 3 results) No results for input(s): PROBNP in the last 8760 hours. CBG: No results for input(s): GLUCAP in the last 168 hours. D-Dimer: Recent Labs    11/25/18 0106 11/26/18 0125  DDIMER 1.02* 1.23*   Hgb A1c: No  results for input(s): HGBA1C in the last 72 hours. Lipid Profile: No results for input(s): CHOL, HDL, LDLCALC, TRIG, CHOLHDL, LDLDIRECT in the last 72 hours. Thyroid function studies: No results for input(s): TSH, T4TOTAL, T3FREE, THYROIDAB in the last 72 hours.  Invalid input(s): FREET3 Anemia work up: No results for input(s): VITAMINB12, FOLATE, FERRITIN, TIBC, IRON, RETICCTPCT in the last  72 hours. Sepsis Labs: Recent Labs  Lab 11/22/18 1912 11/22/18 2047 11/23/18 0533 11/24/18 0110 11/25/18 0106 11/26/18 0125  PROCALCITON  --   --  0.38  --   --   --   WBC 10.6*  --  8.1 10.5 10.1 11.0*  LATICACIDVEN 1.3 1.5  --   --   --   --    Microbiology Recent Results (from the past 240 hour(s))  SARS CORONAVIRUS 2 (TAT 6-24 HRS) Nasopharyngeal Nasopharyngeal Swab     Status: Abnormal   Collection Time: 11/22/18  1:53 PM   Specimen: Nasopharyngeal Swab  Result Value Ref Range Status   SARS Coronavirus 2 POSITIVE (A) NEGATIVE Final    Comment: (NOTE) SARS-CoV-2 target nucleic acids are DETECTED. The SARS-CoV-2 RNA is generally detectable in upper and lower respiratory specimens during the acute phase of infection. Positive results are indicative of active infection with SARS-CoV-2. Clinical  correlation with patient history and other diagnostic information is necessary to determine patient infection status. Positive results do  not rule out bacterial infection or co-infection with other viruses. The expected result is Negative. Fact Sheet for Patients: SugarRoll.be Fact Sheet for Healthcare Providers: https://www.woods-mathews.com/ This test is not yet approved or cleared by the [place] FDA and  has been authorized for detection and/or diagnosis of SARS-CoV-2 by FDA under an Emergency Use Authorization (EUA). This EUA will remain  in effect (meaning this test can be used) for the duration of the COVID-19 declaration under Section  564(b)(1) of the Act, 21 U.S.C.  section 360bbb-3(b)(1), unless the authorization is terminated or revoked sooner. Performed at [facility], [place] [address], [place], [place] [address]   Blood culture (routine x 2)     Status: None (Preliminary result)   Collection Time: 11/22/18  5:49 PM   Specimen: BLOOD  Result Value Ref Range Status   Specimen Description BLOOD RIGHT ANTECUBITAL  Final   Special Requests   Final    BOTTLES DRAWN AEROBIC AND ANAEROBIC Blood Culture adequate volume   Culture   Final    NO GROWTH 4 DAYS Performed at [facility], [address], [place], [place] [address]    Report Status PENDING  Incomplete  Blood culture (routine x 2)     Status: None (Preliminary result)   Collection Time: 11/22/18  5:49 PM   Specimen: BLOOD  Result Value Ref Range Status   Specimen Description BLOOD BLOOD LEFT ARM  Final   Special Requests   Final    BOTTLES DRAWN AEROBIC AND ANAEROBIC Blood Culture results may not be optimal due to an excessive volume of blood received in culture bottles   Culture   Final    NO GROWTH 4 DAYS Performed at [facility], [address], [place],  [address]    Report Status PENDING  Incomplete     Medications:   . dexamethasone (DECADRON) injection  6 mg Intravenous Q24H  . enoxaparin (LOVENOX) injection  40 mg Subcutaneous Q24H  . feeding supplement (ENSURE ENLIVE)  237 mL Oral TID BM  . multivitamin with minerals  1 tablet Oral Daily  . polyethylene glycol  17 g Oral BID  . sodium chloride flush  3 mL Intravenous Q12H  . vitamin C  500 mg Oral Daily  . zinc sulfate  220 mg Oral Daily   Continuous Infusions: . sodium chloride    . remdesivir 100 mg in NS 250 mL Stopped (11/25/18 1115)  LOS: 3 days   Charlynne Cousins  [facility]  11/26/2018, 7:45 AM

## 2018-11-26 NOTE — Plan of Care (Signed)
  Problem: Education: Goal: Knowledge of risk factors and measures for prevention of condition will improve 11/26/2018 1214 by Orvan Falconer, RN Outcome: Progressing 11/26/2018 1214 by Orvan Falconer, RN Outcome: Progressing   Problem: Coping: Goal: Psychosocial and spiritual needs will be supported 11/26/2018 1214 by Orvan Falconer, RN Outcome: Progressing 11/26/2018 1214 by Orvan Falconer, RN Outcome: Progressing   Problem: Respiratory: Goal: Will maintain a patent airway 11/26/2018 1214 by Orvan Falconer, RN Outcome: Progressing 11/26/2018 1214 by Orvan Falconer, RN Outcome: Progressing Goal: Complications related to the disease process, condition or treatment will be avoided or minimized 11/26/2018 1214 by Orvan Falconer, RN Outcome: Progressing 11/26/2018 1214 by Orvan Falconer, RN Outcome: Progressing   Problem: Education: Goal: Knowledge of General Education information will improve Description: Including pain rating scale, medication(s)/side effects and non-pharmacologic comfort measures 11/26/2018 1214 by Orvan Falconer, RN Outcome: Progressing 11/26/2018 1214 by Orvan Falconer, RN Outcome: Progressing   Problem: Health Behavior/Discharge Planning: Goal: Ability to manage health-related needs will improve 11/26/2018 1214 by Orvan Falconer, RN Outcome: Progressing 11/26/2018 1214 by Orvan Falconer, RN Outcome: Progressing   Problem: Clinical Measurements: Goal: Ability to maintain clinical measurements within normal limits will improve 11/26/2018 1214 by Orvan Falconer, RN Outcome: Progressing 11/26/2018 1214 by Orvan Falconer, RN Outcome: Progressing Goal: Will remain free from infection 11/26/2018 1214 by Orvan Falconer, RN Outcome: Progressing 11/26/2018 1214 by Orvan Falconer, RN Outcome: Progressing Goal: Diagnostic test results will improve 11/26/2018 1214 by Orvan Falconer,  RN Outcome: Progressing 11/26/2018 1214 by Orvan Falconer, RN Outcome: Progressing Goal: Respiratory complications will improve 11/26/2018 1214 by Orvan Falconer, RN Outcome: Progressing 11/26/2018 1214 by Orvan Falconer, RN Outcome: Progressing Goal: Cardiovascular complication will be avoided 11/26/2018 1214 by Orvan Falconer, RN Outcome: Progressing 11/26/2018 1214 by Orvan Falconer, RN Outcome: Progressing   Problem: Activity: Goal: Risk for activity intolerance will decrease 11/26/2018 1214 by Orvan Falconer, RN Outcome: Progressing 11/26/2018 1214 by Orvan Falconer, RN Outcome: Progressing   Problem: Nutrition: Goal: Adequate nutrition will be maintained 11/26/2018 1214 by Orvan Falconer, RN Outcome: Progressing 11/26/2018 1214 by Orvan Falconer, RN Outcome: Progressing   Problem: Coping: Goal: Level of anxiety will decrease 11/26/2018 1214 by Orvan Falconer, RN Outcome: Progressing 11/26/2018 1214 by Orvan Falconer, RN Outcome: Progressing   Problem: Elimination: Goal: Will not experience complications related to bowel motility 11/26/2018 1214 by Orvan Falconer, RN Outcome: Progressing 11/26/2018 1214 by Orvan Falconer, RN Outcome: Progressing Goal: Will not experience complications related to urinary retention 11/26/2018 1214 by Orvan Falconer, RN Outcome: Progressing 11/26/2018 1214 by Orvan Falconer, RN Outcome: Progressing   Problem: Pain Managment: Goal: General experience of comfort will improve 11/26/2018 1214 by Orvan Falconer, RN Outcome: Progressing 11/26/2018 1214 by Orvan Falconer, RN Outcome: Progressing   Problem: Safety: Goal: Ability to remain free from injury will improve 11/26/2018 1214 by Orvan Falconer, RN Outcome: Progressing 11/26/2018 1214 by Orvan Falconer, RN Outcome: Progressing   Problem: Skin Integrity: Goal: Risk for impaired skin integrity  will decrease 11/26/2018 1214 by Orvan Falconer, RN Outcome: Progressing 11/26/2018 1214 by Orvan Falconer, RN Outcome: Progressing

## 2018-11-26 NOTE — Progress Notes (Signed)
PHYSICAL THERAPY EVALUATION  History of Illness: 85 y.o. male past medical history of essential hypertension seen in the ED for weakness that started 3 days prior to admission accompanied by shortness of breath and unable to get out of bed to consume fluids. CT chest was done that showed groundglass opacity SARS-CoV-2 PCR was positive on 11/22/2018   Clinical Impression: Pt admitted with above diagnosis. PTA pt ws independent and living home alone. Pt currently with functional limitations due to the deficits listed below (see PT Problem List). Pt presents with deficits in strength, balance and coordination, activity tolerance, functional independence and hence safety with mobility. Pt was on room air throughout session and was able to maintain sats in low to high 90s. Pt will benefit from skilled PT to increase his overall strength, balance and coordination, activity tolerance, independence and safety with mobility to allow discharge home with home health PT, pt would also benefit from supervision with all out of bed mobility at d/c.    11/26/18 0900  PT Visit Information  Last PT Received On 11/26/18  Assistance Needed +1  History of Present Illness   Precautions  Precautions Fall  Restrictions  Weight Bearing Restrictions No  Home Living  Family/patient expects to be discharged to: Private residence  Living Arrangements Alone  Available Help at Discharge Family  Type of [place] Access Level entry  [place] to live on main level with bedroom/bathroom  Bathroom Shower/Tub Walk-in shower  Bathroom Toilet Handicapped height  Bathroom Accessibility Yes  [place] - 2 wheels;Cane - single point;Shower seat - built in  Prior Function  Level of Independence Independent  Communication  Communication HOH  Pain Assessment  Pain Assessment  (upset stomach from bowel movements)  Cognition  Arousal/Alertness Awake/alert  Behavior During Therapy WFL for tasks  assessed/performed  Overall Cognitive Status Within Functional Limits for tasks assessed  Upper Extremity Assessment  Upper Extremity Assessment Generalized weakness  Lower Extremity Assessment  Lower Extremity Assessment Generalized weakness  Bed Mobility  Overal bed mobility Needs Assistance  Bed Mobility Supine to Sit  Supine to sit Supervision  Transfers  Overall transfer level Needs assistance  Equipment used Rolling walker (2 wheeled)  Transfers Sit to/from Stand  Sit to Stand Min assist  Ambulation/Gait  Ambulation/Gait assistance Min assist  Gait Distance (Feet) 5 Feet  Assistive device Rolling walker (2 wheeled)  Gait Pattern/deviations Shuffle  General Gait Details only able to take few steps from bed to recliner with min a and RW, declines to attempt ambulating further sec to bowel issues this am  Gait velocity slow  Balance  Overall balance assessment Needs assistance  Sitting-balance support Feet unsupported  Sitting balance-Leahy Scale Fair  Standing balance support During functional activity;Bilateral upper extremity supported  Standing balance-Leahy Scale Fair  PT - End of Session  Activity Tolerance Treatment limited secondary to medical complications (Comment);Other (comment) (bowel issues )  Patient left in chair;with call bell/phone within reach  PT Assessment  PT Recommendation/Assessment Patient needs continued PT services  PT Visit Diagnosis Unsteadiness on feet (R26.81);Muscle weakness (generalized) (M62.81)  PT Problem List Decreased strength;Decreased activity tolerance;Decreased balance;Decreased mobility;Decreased coordination;Decreased safety awareness  Barriers to Discharge Other (comment)  Barriers to Discharge Comments lives home alone  PT Plan  PT Frequency (ACUTE ONLY) Min 3X/week  PT Treatment/Interventions (ACUTE ONLY) Gait training;Functional mobility training;Therapeutic activities;Therapeutic exercise;Balance training;Neuromuscular  re-education;Patient/family education  AM-PAC PT "6 Clicks" Mobility Outcome Measure (Version 2)  Help needed turning  from your back to your side while in a flat bed without using bedrails? 3  Help needed moving from lying on your back to sitting on the side of a flat bed without using bedrails? 3  Help needed moving to and from a bed to a chair (including a wheelchair)? 3  Help needed standing up from a chair using your arms (e.g., wheelchair or bedside chair)? 3  Help needed to walk in hospital room? 2  Help needed climbing 3-5 steps with a railing?  2  6 Click Score 16  Consider Recommendation of Discharge To: [facility] with [facility]  PT Recommendation  Recommendations for Other Services OT consult  Follow Up Recommendations Supervision for mobility/OOB;Home health PT  PT equipment Rolling walker with 5" wheels  Individuals Consulted  Consulted and Agree with Results and Recommendations Patient  Acute Rehab PT Goals  Patient Stated Goal to get stronger  PT Goal Formulation With patient  Time For Goal Achievement 12/10/18  Potential to Achieve Goals Fair  PT Time Calculation  PT Start Time (ACUTE ONLY) 0919  PT Stop Time (ACUTE ONLY) 0935  PT Time Calculation (min) (ACUTE ONLY) 16 min  PT General Charges  $$ ACUTE PT VISIT 1 Visit  PT Evaluation  $PT Eval Moderate Complexity 1 Mod  Written Expression  Dominant Hand Right    Horald Chestnut, PT

## 2018-11-27 LAB — CBC WITH DIFFERENTIAL/PLATELET
Abs Immature Granulocytes: 0.56 10*3/uL — ABNORMAL HIGH (ref 0.00–0.07)
Basophils Absolute: 0.1 10*3/uL (ref 0.0–0.1)
Basophils Relative: 1 %
Eosinophils Absolute: 0.1 10*3/uL (ref 0.0–0.5)
Eosinophils Relative: 1 %
HCT: 28 % — ABNORMAL LOW (ref 39.0–52.0)
Hemoglobin: 9.4 g/dL — ABNORMAL LOW (ref 13.0–17.0)
Immature Granulocytes: 5 %
Lymphocytes Relative: 12 %
Lymphs Abs: 1.3 10*3/uL (ref 0.7–4.0)
MCH: 28.9 pg (ref 26.0–34.0)
MCHC: 33.6 g/dL (ref 30.0–36.0)
MCV: 86.2 fL (ref 80.0–100.0)
Monocytes Absolute: 0.4 10*3/uL (ref 0.1–1.0)
Monocytes Relative: 4 %
Neutro Abs: 8.7 10*3/uL — ABNORMAL HIGH (ref 1.7–7.7)
Neutrophils Relative %: 77 %
Platelets: 226 10*3/uL (ref 150–400)
RBC: 3.25 MIL/uL — ABNORMAL LOW (ref 4.22–5.81)
RDW: 14.2 % (ref 11.5–15.5)
WBC: 11.2 10*3/uL — ABNORMAL HIGH (ref 4.0–10.5)
nRBC: 0.2 % (ref 0.0–0.2)

## 2018-11-27 LAB — D-DIMER, QUANTITATIVE: D-Dimer, Quant: 1.41 ug/mL-FEU — ABNORMAL HIGH (ref 0.00–0.50)

## 2018-11-27 LAB — COMPREHENSIVE METABOLIC PANEL
ALT: 35 U/L (ref 0–44)
AST: 68 U/L — ABNORMAL HIGH (ref 15–41)
Albumin: 2.4 g/dL — ABNORMAL LOW (ref 3.5–5.0)
Alkaline Phosphatase: 73 U/L (ref 38–126)
Anion gap: 11 (ref 5–15)
BUN: 55 mg/dL — ABNORMAL HIGH (ref 8–23)
CO2: 22 mmol/L (ref 22–32)
Calcium: 8 mg/dL — ABNORMAL LOW (ref 8.9–10.3)
Chloride: 106 mmol/L (ref 98–111)
Creatinine, Ser: 1.2 mg/dL (ref 0.61–1.24)
GFR calc Af Amer: >60 mL/min (ref >60)
GFR calc non Af Amer: 56 mL/min — ABNORMAL LOW (ref >60)
Glucose, Bld: 161 mg/dL — ABNORMAL HIGH (ref 70–99)
Potassium: 4.5 mmol/L (ref 3.5–5.1)
Sodium: 139 mmol/L (ref 135–145)
Total Bilirubin: 1.1 mg/dL (ref 0.3–1.2)
Total Protein: 5.2 g/dL — ABNORMAL LOW (ref 6.5–8.1)

## 2018-11-27 LAB — GLUCOSE, CAPILLARY
Glucose-Capillary: 117 mg/dL — ABNORMAL HIGH (ref 70–99)
Glucose-Capillary: 148 mg/dL — ABNORMAL HIGH (ref 70–99)
Glucose-Capillary: 121 mg/dL — ABNORMAL HIGH (ref 70–99)

## 2018-11-27 LAB — CULTURE, BLOOD (ROUTINE X 2)
Culture: NO GROWTH
Culture: NO GROWTH
Special Requests: ADEQUATE

## 2018-11-27 LAB — C-REACTIVE PROTEIN: CRP: 2.6 mg/dL — ABNORMAL HIGH (ref <1.0)

## 2018-11-27 MED ORDER — LOPERAMIDE HCL 2 MG PO CAPS
2.0000 mg | ORAL_CAPSULE | ORAL | Status: DC | PRN
  Filled 2018-11-27: qty 1

## 2018-11-27 NOTE — Discharge Summary (Signed)
Physician Discharge Summary  Albert [name] [id number] DOB: 1933-11-16 DOA: 11/23/2018  PCP: Patient, No Pcp Per  Admit date: 11/23/2018 Discharge date: 11/27/2018  Admitted From: Home Disposition:  Home  Recommendations for Outpatient Follow-up:  1. Follow up with PCP in 1-2 weeks 2. Please obtain BMP/CBC in one week   Home Health:Yes Equipment/Devices:None  Discharge Condition:Stable CODE STATUS:Full Diet recommendation: Heart Healthy   Brief/Interim Summary: 85 y.o. male past medical history of essential hypertension seen in the ED for weakness that started 3 days prior to admission accompanied by shortness of breath and unable to get out of bed to consume fluids.  She is a poor historian and most of the history was obtained from the ED notes and EHR.  He reports dizziness upon standing.  At [facility] she was found to have a temperature of 101 blood pressure 102/63 and a pulse of 100 respiratory rate of 16.  CT chest was done that showed groundglass opacity SARS-CoV-2 PCR was positive on 11/22/2018   Discharge Diagnoses:  Principal Problem:   Pneumonia due to COVID-19 virus Active Problems:   AKI (acute kidney injury) ([place])   Elevated troponin   Benign essential hypertension   Generalized weakness   Dehydration   CKD (chronic kidney disease), stage III Generalized weakness, dehydration due to COVID-19 pneumonia: Her on IV remdesivir and steroids. His diarrhea resolved, he completed his course of IV remdesivir and steroids in house.  Dehydration slight acute kidney injury on chronic kidney disease stage III: With a baseline creatinine of 1.2 on admission 1.6 likely prerenal azotemia start on IV fluid hydration his creatinine improved to baseline.  Elevated troponins: Setting of chronic renal disease due to stress in the setting of infectious etiology.  Denied any chest pain.  Benign essential hypertension: No changes made to his medication.  New watery  stool: Initially constipated he was placed on MiraLAX and had mild diarrhea which resolved after the MiraLAX was stopped.  Discharge Instructions  Discharge Instructions    Diet - low sodium heart healthy   Complete by: As directed    Increase activity slowly   Complete by: As directed      Allergies as of 11/27/2018   No Known Allergies     Medication List    TAKE these medications   aspirin EC 81 MG tablet Take 81 mg by mouth daily.   atorvastatin 20 MG tablet Commonly known as: LIPITOR Take 20 mg by mouth daily.   celecoxib 200 MG capsule Commonly known as: CELEBREX Take 200 mg by mouth daily.   hydrochlorothiazide 25 MG tablet Commonly known as: HYDRODIURIL Take 25 mg by mouth daily.       No Known Allergies  Consultations:  None   Procedures/Studies: Ct Head Wo Contrast  Result Date: 11/22/2018 CLINICAL DATA:  Unexplained altered level of consciousness. Weakness and dizziness. Duration of symptoms 3 days. EXAM: CT HEAD WITHOUT CONTRAST TECHNIQUE: Contiguous axial images were obtained from the base of the skull through the vertex without intravenous contrast. COMPARISON:  None. FINDINGS: Brain: Age related volume loss. Mild chronic appearing small vessel change of the hemispheric white matter. No sign of acute infarction, mass lesion, hemorrhage, hydrocephalus or extra-axial collection. Vascular: There is atherosclerotic calcification of the major vessels at the base of the brain. Skull: Negative Sinuses/Orbits: Clear/normal Other: None IMPRESSION: No acute finding. Ordinary mild age related volume loss and small-vessel change of the white matter. Electronically Signed   By: Nelson Chimes M.D.   On:  11/22/2018 13:53   Ct Angio Chest Pe W And/or Wo Contrast  Addendum Date: 11/22/2018   ADDENDUM REPORT: 11/22/2018 16:54 ADDENDUM: Ascending thoracic aortic dilatation to 4.1 cm. Recommend annual imaging followup by CTA or MRA. This recommendation follows 2010  ACCF/AHA/AATS/ACR/ASA/SCA/SCAI/SIR/STS/SVM Guidelines for the Diagnosis and Management of Patients with Thoracic Aortic Disease. Circulation. 2010; [id number]. Aortic aneurysm NOS (ICD10-I71.9) This addendum was called by telephone at the time of dictation on 11/22/2018 at 4:53 pm to provider Dr. Alford Highland, Who verbally acknowledged these results. Electronically Signed   By: Lovena Le M.D.   On: 11/22/2018 16:54   Result Date: 11/22/2018 CLINICAL DATA:  Chest pain, complex, high probability of ACS/PE/acute aortic syndrome EXAM: CT ANGIOGRAPHY CHEST WITH CONTRAST TECHNIQUE: Multidetector CT imaging of the chest was performed using the standard protocol during bolus administration of intravenous contrast. Multiplanar CT image reconstructions and MIPs were obtained to evaluate the vascular anatomy. CONTRAST:  71mL OMNIPAQUE IOHEXOL 350 MG/ML SOLN COMPARISON:  Chest radiograph 11/22/2018 FINDINGS: Cardiovascular: Motion artifact limits evaluation of the pulmonary arteries beyond the the lobar level. No central or lobar filling defects are seen. Central pulmonary arteries are normal caliber. Mild cardiomegaly. Extensive coronary artery calcifications are noted. Dense mitral annular calcification is seen as well. No pericardial effusion. Borderline dilation of the ascending thoracic aorta 4.1 cm. Atherosclerotic calcifications are present throughout the thoracic aorta and proximal great vessels. Normal 3 vessel branching of the aortic arch. Mediastinum/Nodes: Scattered low-attenuation borderline enlarged mediastinal and hilar nodes are present including a 11 mm subcarinal lymph node (4/43) and 11 mm right hilar lymph node (4/42). No acute abnormality of the trachea. Posterior bowing compatible with imaging during exhalation. Slightly heterogeneous appearance of the thyroid gland with few punctate calcifications in several hypoattenuating nodules with slight enlargement of the left lobe. Small hiatal hernia. No  acute esophageal abnormality. Lungs/Pleura: There are multifocal areas of ground-glass attenuation with an appearance conspicuous for underlying infection or developing consolidation superimposed on areas of atelectasis. There is diffuse airways thickening. No suspicious nodules or masses. No pneumothorax or effusion. Upper Abdomen: No acute abnormalities present in the visualized portions of the upper abdomen. Punctate benign-appearing calcification head Musculoskeletal: Exaggeration of the normal thoracic kyphosis with multilevel bridging syndesmophytes across the spine suggesting some degree of ankylosis. No acute or suspicious osseous lesions are seen. There is an increased AP diameter of the chest associated with the exaggerated curvature. Additional discogenic changes are present in the cervical spine as well. Review of the MIP images confirms the above findings. IMPRESSION: 1. Motion artifact limits evaluation of the pulmonary arteries beyond the lobar level. No central or lobar filling defects are seen. 2. Multifocal areas of ground-glass attenuation with an appearance conspicuous for underlying infection or developing consolidation superimposed on areas of atelectasis. 3. Scattered borderline enlarged mediastinal and hilar nodes, nonspecific, but likely reactive. 4. Exaggerated thoracic kyphosis and bridging syndesmophyte formation conspicuous for ankylosing spondylitis. 5. Aortic Atherosclerosis (ICD10-I70.0). Electronically Signed: By: Lovena Le M.D. On: 11/22/2018 14:46   Dg Chest Portable 1 View  Result Date: 11/22/2018 CLINICAL DATA:  Shortness of breath EXAM: PORTABLE CHEST 1 VIEW COMPARISON:  None. FINDINGS: There is apparent scarring in the right mid and lower lung zones. There is bibasilar atelectasis. There is no edema or consolidation. Heart size and pulmonary vascularity are normal. No adenopathy. There is aortic atherosclerosis. There is degenerative change in the thoracic spine.  IMPRESSION: No frank edema or consolidation. Bibasilar atelectasis with apparent scarring in the right mid  lower lung zones. Heart size within normal limits. No adenopathy. Aortic Atherosclerosis (ICD10-I70.0). Electronically Signed   By: Lowella Grip III M.D.   On: 11/22/2018 13:24    (Echo, Carotid, EGD, Colonoscopy, ERCP)    Subjective: Feels great able to walk on any difficulties or shortness of breath with a good appetite  Discharge Exam: Vitals:   11/27/18 0700 11/27/18 0730  BP: (!) 108/59 (!) 117/57  Pulse: (!) 53 (!) 52  Resp:  16  Temp:  98.4 F (36.9 C)  SpO2: 99% 98%   Vitals:   11/26/18 1935 11/27/18 0500 11/27/18 0700 11/27/18 0730  BP:   (!) 108/59 (!) 117/57  Pulse:   (!) 53 (!) 52  Resp: 20   16  Temp: 97.8 F (36.6 C) (!) 97.5 F (36.4 C)  98.4 F (36.9 C)  TempSrc:    Oral  SpO2:   99% 98%  Weight:      Height:        General: Pt is alert, awake, not in acute distress Cardiovascular: RRR, S1/S2 +, no rubs, no gallops Respiratory: CTA bilaterally, no wheezing, no rhonchi Abdominal: Soft, NT, ND, bowel sounds + Extremities: no edema, no cyanosis    The results of significant diagnostics from this hospitalization (including imaging, microbiology, ancillary and laboratory) are listed below for reference.     Microbiology: Recent Results (from the past 240 hour(s))  SARS CORONAVIRUS 2 (TAT 6-24 HRS) Nasopharyngeal Nasopharyngeal Swab     Status: Abnormal   Collection Time: 11/22/18  1:53 PM   Specimen: Nasopharyngeal Swab  Result Value Ref Range Status   SARS Coronavirus 2 POSITIVE (A) NEGATIVE Final    Comment: (NOTE) SARS-CoV-2 target nucleic acids are DETECTED. The SARS-CoV-2 RNA is generally detectable in upper and lower respiratory specimens during the acute phase of infection. Positive results are indicative of active infection with SARS-CoV-2. Clinical  correlation with patient history and other diagnostic information is necessary to  determine patient infection status. Positive results do  not rule out bacterial infection or co-infection with other viruses. The expected result is Negative. Fact Sheet for Patients: SugarRoll.be Fact Sheet for Healthcare Providers: https://www.woods-mathews.com/ This test is not yet approved or cleared by the [place] FDA and  has been authorized for detection and/or diagnosis of SARS-CoV-2 by FDA under an Emergency Use Authorization (EUA). This EUA will remain  in effect (meaning this test can be used) for the duration of the COVID-19 declaration under Section 564(b)(1) of the Act, 21 U.S.C.  section 360bbb-3(b)(1), unless the authorization is terminated or revoked sooner. Performed at [facility], [place] [address] [place], [place] [address]   Blood culture (routine x 2)     Status: None   Collection Time: 11/22/18  5:49 PM   Specimen: BLOOD  Result Value Ref Range Status   Specimen Description BLOOD RIGHT ANTECUBITAL  Final   Special Requests   Final    BOTTLES DRAWN AEROBIC AND ANAEROBIC Blood Culture adequate volume   Culture   Final    NO GROWTH 5 DAYS Performed at [facility], [address] [place], [place] [address]    Report Status 11/27/2018 FINAL  Final  Blood culture (routine x 2)     Status: None   Collection Time: 11/22/18  5:49 PM   Specimen: BLOOD  Result Value Ref Range Status   Specimen Description BLOOD BLOOD LEFT ARM  Final   Special Requests   Final    BOTTLES DRAWN  AEROBIC AND ANAEROBIC Blood Culture results may not be optimal due to an excessive volume of blood received in culture bottles   Culture   Final    NO GROWTH 5 DAYS Performed at [facility], [place] [place], [place] [address]    Report Status 11/27/2018 FINAL  Final     Labs: BNP (last 3 results) No results for input(s): BNP in the last 8760 hours. Basic Metabolic Panel: Recent Labs  Lab 11/22/18 1251  11/22/18 1912 11/23/18 0533 11/24/18 0110 11/25/18 0106 11/26/18 0125 11/27/18 0235  NA 134*  --  135 135 138 140 139  K 3.6  --  3.4* 3.5 4.1 4.7 4.5  CL 95*  --  96* 102 104 107 106  CO2 23  --  22 19* 22 22 22   GLUCOSE 133*  --  152* 125* 123* 187* 161*  BUN 73*  --  73* 79* 74* 66* 55*  CREATININE 1.78* 1.58* 1.61* 1.25* 1.23 1.14 1.20  CALCIUM 8.7*  --  8.0* 7.8* 7.9* 8.1* 8.0*  MG 2.1 1.8 2.0  --   --   --   --   PHOS  --  2.7 4.1  --   --   --   --    Liver Function Tests: Recent Labs  Lab 11/23/18 0533 11/24/18 0110 11/25/18 0106 11/26/18 0125 11/27/18 0235  AST 35 39 40 60* 68*  ALT 9 14 14 23  35  ALKPHOS 57 65 62 67 73  BILITOT 1.2 0.9 0.9 0.7 1.1  PROT 6.0* 5.3* 5.2* 5.1* 5.2*  ALBUMIN 2.4* 2.2* 2.2* 2.2* 2.4*   No results for input(s): LIPASE, AMYLASE in the last 168 hours. No results for input(s): AMMONIA in the last 168 hours. CBC: Recent Labs  Lab 11/23/18 0533 11/24/18 0110 11/25/18 0106 11/26/18 0125 11/27/18 0235  WBC 8.1 10.5 10.1 11.0* 11.2*  NEUTROABS 7.3 9.1* 8.4* 8.7* 8.7*  HGB 10.9* 10.3* 9.4* 9.6* 9.4*  HCT 31.3* 29.6* 27.8* 28.2* 28.0*  MCV 81.1 82.2 83.5 84.9 86.2  PLT 145* 172 190 200 226   Cardiac Enzymes: Recent Labs  Lab 11/22/18 1251 11/23/18 0533  CKTOTAL 65 128   BNP: Invalid input(s): POCBNP CBG: Recent Labs  Lab 11/26/18 1228 11/26/18 1653 11/26/18 2024 11/27/18 0740  GLUCAP 152* 93 99 117*   D-Dimer Recent Labs    11/26/18 0125 11/27/18 0235  DDIMER 1.23* 1.41*   Hgb A1c Recent Labs    11/26/18 0125  HGBA1C 5.1   Lipid Profile No results for input(s): CHOL, HDL, LDLCALC, TRIG, CHOLHDL, LDLDIRECT in the last 72 hours. Thyroid function studies No results for input(s): TSH, T4TOTAL, T3FREE, THYROIDAB in the last 72 hours.  Invalid input(s): FREET3 Anemia work up No results for input(s): VITAMINB12, FOLATE, FERRITIN, TIBC, IRON, RETICCTPCT in the last 72 hours. Urinalysis    Component Value  Date/Time   COLORURINE YELLOW (A) 11/22/2018 1251   APPEARANCEUR HAZY (A) 11/22/2018 1251   LABSPEC 1.015 11/22/2018 1251   PHURINE 5.0 11/22/2018 1251   GLUCOSEU NEGATIVE 11/22/2018 1251   HGBUR SMALL (A) 11/22/2018 1251   BILIRUBINUR NEGATIVE 11/22/2018 1251   KETONESUR NEGATIVE 11/22/2018 1251   PROTEINUR 100 (A) 11/22/2018 1251   NITRITE NEGATIVE 11/22/2018 1251   LEUKOCYTESUR NEGATIVE 11/22/2018 1251   Sepsis Labs Invalid input(s): PROCALCITONIN,  WBC,  LACTICIDVEN Microbiology Recent Results (from the past 240 hour(s))  SARS CORONAVIRUS 2 (TAT 6-24 HRS) Nasopharyngeal Nasopharyngeal Swab     Status: Abnormal  Collection Time: 11/22/18  1:53 PM   Specimen: Nasopharyngeal Swab  Result Value Ref Range Status   SARS Coronavirus 2 POSITIVE (A) NEGATIVE Final    Comment: (NOTE) SARS-CoV-2 target nucleic acids are DETECTED. The SARS-CoV-2 RNA is generally detectable in upper and lower respiratory specimens during the acute phase of infection. Positive results are indicative of active infection with SARS-CoV-2. Clinical  correlation with patient history and other diagnostic information is necessary to determine patient infection status. Positive results do  not rule out bacterial infection or co-infection with other viruses. The expected result is Negative. Fact Sheet for Patients: SugarRoll.be Fact Sheet for Healthcare Providers: https://www.woods-mathews.com/ This test is not yet approved or cleared by the [place] FDA and  has been authorized for detection and/or diagnosis of SARS-CoV-2 by FDA under an Emergency Use Authorization (EUA). This EUA will remain  in effect (meaning this test can be used) for the duration of the COVID-19 declaration under Section 564(b)(1) of the Act, 21 U.S.C.  section 360bbb-3(b)(1), unless the authorization is terminated or revoked sooner. Performed at [facility], [place] [address],  [place], [place] [address]   Blood culture (routine x 2)     Status: None   Collection Time: 11/22/18  5:49 PM   Specimen: BLOOD  Result Value Ref Range Status   Specimen Description BLOOD RIGHT ANTECUBITAL  Final   Special Requests   Final    BOTTLES DRAWN AEROBIC AND ANAEROBIC Blood Culture adequate volume   Culture   Final    NO GROWTH 5 DAYS Performed at [facility], [address] [place], [place] [address]    Report Status 11/27/2018 FINAL  Final  Blood culture (routine x 2)     Status: None   Collection Time: 11/22/18  5:49 PM   Specimen: BLOOD  Result Value Ref Range Status   Specimen Description BLOOD BLOOD LEFT ARM  Final   Special Requests   Final    BOTTLES DRAWN AEROBIC AND ANAEROBIC Blood Culture results may not be optimal due to an excessive volume of blood received in culture bottles   Culture   Final    NO GROWTH 5 DAYS Performed at [facility], [address] [place], [place] [address]    Report Status 11/27/2018 FINAL  Final     Time coordinating discharge: Over 40 minutes  SIGNED:   Charlynne Cousins, MD  [facility] 11/27/2018, 10:45 AM Pager   If 7PM-7AM, please contact night-coverage [email or web address] Password [email or web address]

## 2018-11-27 NOTE — Progress Notes (Signed)
Per RN, waiting for PT to re-evaluate patient due to needing 2x assistance with walking. Will continue to follow.   Kingsley Spittle, LCSW [facility]  [phone]

## 2018-11-27 NOTE — Plan of Care (Addendum)
Voiced concerns with MD regarding patients ambulatory status. PT will re-evaluate. Will continue to monitor for remainder of shift.    Problem: Education: Goal: Knowledge of risk factors and measures for prevention of condition will improve 11/27/2018 1037 by Orvan Falconer, RN Outcome: Progressing 11/27/2018 1037 by Orvan Falconer, RN Outcome: Progressing   Problem: Coping: Goal: Psychosocial and spiritual needs will be supported 11/27/2018 1037 by Orvan Falconer, RN Outcome: Progressing 11/27/2018 1037 by Orvan Falconer, RN Outcome: Progressing   Problem: Respiratory: Goal: Will maintain a patent airway 11/27/2018 1037 by Orvan Falconer, RN Outcome: Progressing 11/27/2018 1037 by Orvan Falconer, RN Outcome: Progressing Goal: Complications related to the disease process, condition or treatment will be avoided or minimized 11/27/2018 1037 by Orvan Falconer, RN Outcome: Progressing 11/27/2018 1037 by Orvan Falconer, RN Outcome: Progressing   Problem: Education: Goal: Knowledge of General Education information will improve Description: Including pain rating scale, medication(s)/side effects and non-pharmacologic comfort measures 11/27/2018 1037 by Orvan Falconer, RN Outcome: Progressing 11/27/2018 1037 by Orvan Falconer, RN Outcome: Progressing   Problem: Health Behavior/Discharge Planning: Goal: Ability to manage health-related needs will improve 11/27/2018 1037 by Orvan Falconer, RN Outcome: Progressing 11/27/2018 1037 by Orvan Falconer, RN Outcome: Progressing   Problem: Clinical Measurements: Goal: Ability to maintain clinical measurements within normal limits will improve 11/27/2018 1037 by Orvan Falconer, RN Outcome: Progressing 11/27/2018 1037 by Orvan Falconer, RN Outcome: Progressing Goal: Will remain free from infection 11/27/2018 1037 by Orvan Falconer, RN Outcome: Progressing 11/27/2018 1037 by  Orvan Falconer, RN Outcome: Progressing Goal: Diagnostic test results will improve 11/27/2018 1037 by Orvan Falconer, RN Outcome: Progressing 11/27/2018 1037 by Orvan Falconer, RN Outcome: Progressing Goal: Respiratory complications will improve 11/27/2018 1037 by Orvan Falconer, RN Outcome: Progressing 11/27/2018 1037 by Orvan Falconer, RN Outcome: Progressing Goal: Cardiovascular complication will be avoided 11/27/2018 1037 by Orvan Falconer, RN Outcome: Progressing 11/27/2018 1037 by Orvan Falconer, RN Outcome: Progressing   Problem: Activity: Goal: Risk for activity intolerance will decrease 11/27/2018 1037 by Orvan Falconer, RN Outcome: Progressing 11/27/2018 1037 by Orvan Falconer, RN Outcome: Progressing   Problem: Nutrition: Goal: Adequate nutrition will be maintained 11/27/2018 1037 by Orvan Falconer, RN Outcome: Progressing 11/27/2018 1037 by Orvan Falconer, RN Outcome: Progressing   Problem: Coping: Goal: Level of anxiety will decrease 11/27/2018 1037 by Orvan Falconer, RN Outcome: Progressing 11/27/2018 1037 by Orvan Falconer, RN Outcome: Progressing   Problem: Elimination: Goal: Will not experience complications related to bowel motility 11/27/2018 1037 by Orvan Falconer, RN Outcome: Progressing 11/27/2018 1037 by Orvan Falconer, RN Outcome: Progressing Goal: Will not experience complications related to urinary retention 11/27/2018 1037 by Orvan Falconer, RN Outcome: Progressing 11/27/2018 1037 by Orvan Falconer, RN Outcome: Progressing   Problem: Pain Managment: Goal: General experience of comfort will improve 11/27/2018 1037 by Orvan Falconer, RN Outcome: Progressing 11/27/2018 1037 by Orvan Falconer, RN Outcome: Progressing   Problem: Safety: Goal: Ability to remain free from injury will improve 11/27/2018 1037 by Orvan Falconer, RN Outcome:  Progressing 11/27/2018 1037 by Orvan Falconer, RN Outcome: Progressing   Problem: Skin Integrity: Goal: Risk for impaired skin integrity will decrease 11/27/2018 1037 by Orvan Falconer, RN Outcome: Progressing 11/27/2018 1037 by Orvan Falconer, RN Outcome: Progressing

## 2018-11-28 DIAGNOSIS — R778 Other specified abnormalities of plasma proteins: Secondary | ICD-10-CM

## 2018-11-28 LAB — COMPREHENSIVE METABOLIC PANEL
ALT: 38 U/L (ref 0–44)
AST: 60 U/L — ABNORMAL HIGH (ref 15–41)
Albumin: 2.3 g/dL — ABNORMAL LOW (ref 3.5–5.0)
Alkaline Phosphatase: 71 U/L (ref 38–126)
Anion gap: 10 (ref 5–15)
BUN: 51 mg/dL — ABNORMAL HIGH (ref 8–23)
CO2: 23 mmol/L (ref 22–32)
Calcium: 7.7 mg/dL — ABNORMAL LOW (ref 8.9–10.3)
Chloride: 106 mmol/L (ref 98–111)
Creatinine, Ser: 1.34 mg/dL — ABNORMAL HIGH (ref 0.61–1.24)
GFR calc Af Amer: 56 mL/min — ABNORMAL LOW (ref >60)
GFR calc non Af Amer: 49 mL/min — ABNORMAL LOW (ref >60)
Glucose, Bld: 82 mg/dL (ref 70–99)
Potassium: 4.1 mmol/L (ref 3.5–5.1)
Sodium: 139 mmol/L (ref 135–145)
Total Bilirubin: 1.1 mg/dL (ref 0.3–1.2)
Total Protein: 5.2 g/dL — ABNORMAL LOW (ref 6.5–8.1)

## 2018-11-28 LAB — CBC WITH DIFFERENTIAL/PLATELET
Abs Immature Granulocytes: 0.85 10*3/uL — ABNORMAL HIGH (ref 0.00–0.07)
Basophils Absolute: 0.1 10*3/uL (ref 0.0–0.1)
Basophils Relative: 1 %
Eosinophils Absolute: 0.3 10*3/uL (ref 0.0–0.5)
Eosinophils Relative: 3 %
HCT: 29.1 % — ABNORMAL LOW (ref 39.0–52.0)
Hemoglobin: 9.8 g/dL — ABNORMAL LOW (ref 13.0–17.0)
Immature Granulocytes: 8 %
Lymphocytes Relative: 26 %
Lymphs Abs: 2.9 10*3/uL (ref 0.7–4.0)
MCH: 29.4 pg (ref 26.0–34.0)
MCHC: 33.7 g/dL (ref 30.0–36.0)
MCV: 87.4 fL (ref 80.0–100.0)
Monocytes Absolute: 0.6 10*3/uL (ref 0.1–1.0)
Monocytes Relative: 6 %
Neutro Abs: 6.6 10*3/uL (ref 1.7–7.7)
Neutrophils Relative %: 56 %
Platelets: 239 10*3/uL (ref 150–400)
RBC: 3.33 MIL/uL — ABNORMAL LOW (ref 4.22–5.81)
RDW: 14.2 % (ref 11.5–15.5)
WBC: 11.3 10*3/uL — ABNORMAL HIGH (ref 4.0–10.5)
nRBC: 0.6 % — ABNORMAL HIGH (ref 0.0–0.2)

## 2018-11-28 LAB — D-DIMER, QUANTITATIVE: D-Dimer, Quant: 1.42 ug/mL-FEU — ABNORMAL HIGH (ref 0.00–0.50)

## 2018-11-28 LAB — C-REACTIVE PROTEIN: CRP: 1.5 mg/dL — ABNORMAL HIGH (ref <1.0)

## 2018-11-28 LAB — GLUCOSE, CAPILLARY
Glucose-Capillary: 105 mg/dL — ABNORMAL HIGH (ref 70–99)
Glucose-Capillary: 64 mg/dL — ABNORMAL LOW (ref 70–99)
Glucose-Capillary: 119 mg/dL — ABNORMAL HIGH (ref 70–99)

## 2018-11-28 NOTE — Progress Notes (Signed)
[facility] PROGRESS NOTE    Progress Note  Albert [name]  [id number] DOB: 02-26-1933 DOA: 11/23/2018 PCP: Patient, No Pcp Per     Brief Narrative:   Albert [name] is an 85 y.o. male past medical history of essential hypertension seen in the ED for weakness that started 3 days prior to admission accompanied by shortness of breath and unable to get out of bed to consume fluids.  She is a poor historian and most of the history was obtained from the ED notes and EHR.  He reports dizziness upon standing.  At [facility] she was found to have a temperature of 101 blood pressure 102/63 and a pulse of 100 respiratory rate of 16.  CT chest was done that showed groundglass opacity SARS-CoV-2 PCR was positive on 11/22/2018  Assessment/Plan:   Generalized weakness, dehydration due to Pneumonia due to COVID-19 virus Currently satting greater 95% on room air. He completed his course of IV remdesivir and steroids. Physical therapy evaluated the patient the recommended skilled nursing facility. Social worker is working on Education officer, community. No changes overnight.  Dehydration/AKI (acute kidney injury) ([place]) on chronic kidney disease stage III: Likely prerenal azotemia resolved with IV fluid hydration.  Elevated troponin Denies any chest pain shortness of breath resolved.  Benign essential hypertension Continue current home regimen.  [place] stool: Resolved.  DVT prophylaxis: lovenox Family Communication:none Disposition Plan/Barrier to D/C: unable to determine Code Status:     Code Status Orders  (From admission, onward)         Start     Ordered   11/23/18 2240  Full code  Continuous     11/23/18 2241        Code Status History    Date Active Date Inactive Code Status Order ID Comments User Context   11/22/2018 1732 11/23/2018 2212 Full Code [id number]  Para Skeans, MD ED   Advance Care Planning Activity    Advance Directive Documentation      Most Recent Value  Type of Advance Directive  Living will  Pre-existing out of facility DNR order (yellow form or pink MOST form)  -  "MOST" Form in Place?  -        IV Access:    Peripheral IV   Procedures and diagnostic studies:   No results found.   Medical Consultants:    None.  Anti-Infectives:   IV remdesivir  Subjective:    JULIANN KEELEY relates he feels great ready to go.  Objective:    Vitals:   11/27/18 0730 11/27/18 1550 11/27/18 1921 11/28/18 0448  BP: (!) 117/57 (!) 115/52 (!) 101/53 101/64  Pulse: (!) 52 71 64 72  Resp: 16 18 16 18   Temp: 98.4 F (36.9 C) 97.8 F (36.6 C) 98 F (36.7 C) 98.8 F (37.1 C)  TempSrc: Oral Oral Oral   SpO2: 98% 100% 100% 98%  Weight:      Height:       SpO2: 98 %   Intake/Output Summary (Last 24 hours) at 11/28/2018 0819 Last data filed at 11/27/2018 1923 Gross per 24 hour  Intake 730 ml  Output 300 ml  Net 430 ml   Filed Weights   11/23/18 2200  Weight: 60.4 kg    Exam: General exam: In no acute distress. Respiratory system: Good air movement and diffuse crackles mainly on the left side. Cardiovascular system: S1 & S2 heard, RRR. No JVD,. Gastrointestinal system: Abdomen is nondistended, soft  and nontender.  Central nervous system: Alert and oriented. No focal neurological deficits. Extremities: No pedal edema. Skin: No rashes, lesions or ulcers Psychiatry: Judgement and insight appear normal. Mood & affect appropriate.   Data Reviewed:    Labs: Basic Metabolic Panel: Recent Labs  Lab 11/22/18 1251 11/22/18 1912 11/23/18 0533 11/24/18 0110 11/25/18 0106 11/26/18 0125 11/27/18 0235 11/28/18 0326  NA 134*  --  135 135 138 140 139 139  K 3.6  --  3.4* 3.5 4.1 4.7 4.5 4.1  CL 95*  --  96* 102 104 107 106 106  CO2 23  --  22 19* 22 22 22 23   GLUCOSE 133*  --  152* 125* 123* 187* 161* 82  BUN 73*  --  73* 79* 74* 66* 55* 51*  CREATININE 1.78* 1.58* 1.61* 1.25* 1.23 1.14 1.20  1.34*  CALCIUM 8.7*  --  8.0* 7.8* 7.9* 8.1* 8.0* 7.7*  MG 2.1 1.8 2.0  --   --   --   --   --   PHOS  --  2.7 4.1  --   --   --   --   --    GFR Estimated Creatinine Clearance: 35.7 mL/min (A) (by C-G formula based on SCr of 1.34 mg/dL (H)). Liver Function Tests: Recent Labs  Lab 11/24/18 0110 11/25/18 0106 11/26/18 0125 11/27/18 0235 11/28/18 0326  AST 39 40 60* 68* 60*  ALT 14 14 23  35 38  ALKPHOS 65 62 67 73 71  BILITOT 0.9 0.9 0.7 1.1 1.1  PROT 5.3* 5.2* 5.1* 5.2* 5.2*  ALBUMIN 2.2* 2.2* 2.2* 2.4* 2.3*   No results for input(s): LIPASE, AMYLASE in the last 168 hours. No results for input(s): AMMONIA in the last 168 hours. Coagulation profile No results for input(s): INR, PROTIME in the last 168 hours. COVID-19 Labs  Recent Labs    11/26/18 0125 11/27/18 0235 11/28/18 0326  DDIMER 1.23* 1.41* 1.42*  CRP 4.2* 2.6* 1.5*    Lab Results  Component Value Date   SARSCOV2NAA POSITIVE (A) 11/22/2018    CBC: Recent Labs  Lab 11/24/18 0110 11/25/18 0106 11/26/18 0125 11/27/18 0235 11/28/18 0326  WBC 10.5 10.1 11.0* 11.2* 11.3*  NEUTROABS 9.1* 8.4* 8.7* 8.7* 6.6  HGB 10.3* 9.4* 9.6* 9.4* 9.8*  HCT 29.6* 27.8* 28.2* 28.0* 29.1*  MCV 82.2 83.5 84.9 86.2 87.4  PLT 172 190 200 226 239   Cardiac Enzymes: Recent Labs  Lab 11/22/18 1251 11/23/18 0533  CKTOTAL 65 128   BNP (last 3 results) No results for input(s): PROBNP in the last 8760 hours. CBG: Recent Labs  Lab 11/26/18 1653 11/26/18 2024 11/27/18 0740 11/27/18 1237 11/27/18 2024  GLUCAP 93 99 117* 148* 121*   D-Dimer: Recent Labs    11/27/18 0235 11/28/18 0326  DDIMER 1.41* 1.42*   Hgb A1c: Recent Labs    11/26/18 0125  HGBA1C 5.1   Lipid Profile: No results for input(s): CHOL, HDL, LDLCALC, TRIG, CHOLHDL, LDLDIRECT in the last 72 hours. Thyroid function studies: No results for input(s): TSH, T4TOTAL, T3FREE, THYROIDAB in the last 72 hours.  Invalid input(s): FREET3 Anemia work up:  No results for input(s): VITAMINB12, FOLATE, FERRITIN, TIBC, IRON, RETICCTPCT in the last 72 hours. Sepsis Labs: Recent Labs  Lab 11/22/18 1912 11/22/18 2047 11/23/18 0533  11/25/18 0106 11/26/18 0125 11/27/18 0235 11/28/18 0326  PROCALCITON  --   --  0.38  --   --   --   --   --  WBC 10.6*  --  8.1   < > 10.1 11.0* 11.2* 11.3*  LATICACIDVEN 1.3 1.5  --   --   --   --   --   --    < > = values in this interval not displayed.   Microbiology Recent Results (from the past 240 hour(s))  SARS CORONAVIRUS 2 (TAT 6-24 HRS) Nasopharyngeal Nasopharyngeal Swab     Status: Abnormal   Collection Time: 11/22/18  1:53 PM   Specimen: Nasopharyngeal Swab  Result Value Ref Range Status   SARS Coronavirus 2 POSITIVE (A) NEGATIVE Final    Comment: (NOTE) SARS-CoV-2 target nucleic acids are DETECTED. The SARS-CoV-2 RNA is generally detectable in upper and lower respiratory specimens during the acute phase of infection. Positive results are indicative of active infection with SARS-CoV-2. Clinical  correlation with patient history and other diagnostic information is necessary to determine patient infection status. Positive results do  not rule out bacterial infection or co-infection with other viruses. The expected result is Negative. Fact Sheet for Patients: SugarRoll.be Fact Sheet for Healthcare Providers: https://www.woods-mathews.com/ This test is not yet approved or cleared by the [place] FDA and  has been authorized for detection and/or diagnosis of SARS-CoV-2 by FDA under an Emergency Use Authorization (EUA). This EUA will remain  in effect (meaning this test can be used) for the duration of the COVID-19 declaration under Section 564(b)(1) of the Act, 21 U.S.C.  section 360bbb-3(b)(1), unless the authorization is terminated or revoked sooner. Performed at [facility], [place] [address], [place], [place] [address]   Blood culture  (routine x 2)     Status: None   Collection Time: 11/22/18  5:49 PM   Specimen: BLOOD  Result Value Ref Range Status   Specimen Description BLOOD RIGHT ANTECUBITAL  Final   Special Requests   Final    BOTTLES DRAWN AEROBIC AND ANAEROBIC Blood Culture adequate volume   Culture   Final    NO GROWTH 5 DAYS Performed at [facility], [address] [place], [place] [address]    Report Status 11/27/2018 FINAL  Final  Blood culture (routine x 2)     Status: None   Collection Time: 11/22/18  5:49 PM   Specimen: BLOOD  Result Value Ref Range Status   Specimen Description BLOOD BLOOD LEFT ARM  Final   Special Requests   Final    BOTTLES DRAWN AEROBIC AND ANAEROBIC Blood Culture results may not be optimal due to an excessive volume of blood received in culture bottles   Culture   Final    NO GROWTH 5 DAYS Performed at [facility], [address], [place], [place] [address]    Report Status 11/27/2018 FINAL  Final     Medications:   . enoxaparin (LOVENOX) injection  40 mg Subcutaneous Q24H  . feeding supplement (ENSURE ENLIVE)  237 mL Oral TID BM  . insulin aspart  0-6 Units Subcutaneous TID WC  . insulin aspart  2 Units Subcutaneous TID WC  . insulin detemir  5 Units Subcutaneous QHS  . multivitamin with minerals  1 tablet Oral Daily  . sodium chloride flush  3 mL Intravenous Q12H  . vitamin C  500 mg Oral Daily  . zinc sulfate  220 mg Oral Daily   Continuous Infusions: . sodium chloride        LOS: 5 days   Charlynne Cousins  [facility]  11/28/2018, 8:19 AM

## 2018-11-28 NOTE — Progress Notes (Signed)
1900 - Patient report received, peri care rendered. Condom Cath placed on patient. All belongings with patient and room checked for other possible loose items. None found. Patient transferred with this nurse. Oriented to room set up. Showed patient where his items are. Cell phone with patient, call bell in reach, bed in locked and lowest position. Charger for cell phone on table next to the bed.

## 2018-11-28 NOTE — NC FL2 (Signed)
[place] LEVEL OF CARE SCREENING TOOL     IDENTIFICATION  Patient Name: Albert [name] Birthdate: [birth year] Sex: male Admission Date (Current Location): 11/23/2018  [facility] and [place] Number:  Herbalist and Address:  [facility] [place], [place], [place] [address]([facility])      Provider Number: [id number]  Attending Physician Name and Address:  Charlynne Cousins, MD  Relative Name and Phone Number:       Current Level of Care: Hospital Recommended Level of Care: [place] Prior Approval Number:    Date Approved/Denied:   PASRR Number: [id number]  Discharge Plan: SNF    Current Diagnoses: Patient Active Problem List   Diagnosis Date Noted  . Dehydration 11/24/2018  . CKD (chronic kidney disease), stage III 11/24/2018  . Pneumonia due to COVID-19 virus 11/23/2018  . Encephalopathy acute 11/22/2018  . Generalized weakness 11/22/2018  . AKI (acute kidney injury) ([place])   . Elevated troponin   . Pneumonia due to infectious organism   . Benign essential hypertension 06/15/2013    Orientation RESPIRATION BLADDER Height & Weight     Self, Time, Situation, Place  Normal Incontinent Weight: 60.4 kg Height:  5\' 9"  (175.3 cm)  BEHAVIORAL SYMPTOMS/MOOD NEUROLOGICAL BOWEL NUTRITION STATUS      Incontinent Diet  AMBULATORY STATUS COMMUNICATION OF NEEDS Skin   Extensive Assist Verbally Normal                       Personal Care Assistance Level of Assistance  Bathing, Dressing, Total care Bathing Assistance: Maximum assistance   Dressing Assistance: Maximum assistance Total Care Assistance: Maximum assistance   Functional Limitations Info  Hearing   Hearing Info: Impaired(very hard of hearing)      SPECIAL CARE FACTORS FREQUENCY  PT (By licensed PT), OT (By licensed OT)     PT Frequency: 5x a week OT Frequency: 5x a week            Contractures Contractures  Info: Not present    Additional Factors Info  Code Status Code Status Info: Full             Current Medications (11/28/2018):  This is the current hospital active medication list Current Facility-Administered Medications  Medication Dose Route Frequency Provider Last Rate Last Dose  . 0.9 %  sodium chloride infusion  250 mL Intravenous PRN Phillips Grout, MD      . acetaminophen (TYLENOL) tablet 650 mg  650 mg Oral Q6H PRN Phillips Grout, MD   650 mg at 11/28/18 1210  . chlorpheniramine-HYDROcodone (TUSSIONEX) 10-8 MG/5ML suspension 5 mL  5 mL Oral Q12H PRN Derrill Kay A, MD      . enoxaparin (LOVENOX) injection 40 mg  40 mg Subcutaneous Q24H Skeet Simmer, RPH   40 mg at 11/27/18 2059  . feeding supplement (ENSURE ENLIVE) (ENSURE ENLIVE) liquid 237 mL  237 mL Oral TID BM Charlynne Cousins, MD   237 mL at 11/27/18 1922  . guaiFENesin-dextromethorphan (ROBITUSSIN DM) 100-10 MG/5ML syrup 10 mL  10 mL Oral Q4H PRN Derrill Kay A, MD      . insulin aspart (novoLOG) injection 0-6 Units  0-6 Units Subcutaneous TID WC Charlynne Cousins, MD   1 Units at 11/26/18 1300  . insulin aspart (novoLOG) injection 2 Units  2 Units Subcutaneous TID WC Charlynne Cousins, MD   2 Units at 11/27/18 1300  .  insulin detemir (LEVEMIR) injection 5 Units  5 Units Subcutaneous QHS Charlynne Cousins, MD   5 Units at 11/27/18 2059  . loperamide (IMODIUM) capsule 2 mg  2 mg Oral PRN Charlynne Cousins, MD      . multivitamin with minerals tablet 1 tablet  1 tablet Oral Daily Charlynne Cousins, MD   1 tablet at 11/27/18 0908  . ondansetron (ZOFRAN) tablet 4 mg  4 mg Oral Q6H PRN Phillips Grout, MD       Or  . ondansetron [facility]) injection 4 mg  4 mg Intravenous Q6H PRN Derrill Kay A, MD      . sodium chloride flush (NS) 0.9 % injection 3 mL  3 mL Intravenous Q12H Derrill Kay A, MD   3 mL at 11/28/18 0926  . sodium chloride flush (NS) 0.9 % injection 3 mL  3 mL Intravenous PRN Derrill Kay  A, MD      . vitamin C (ASCORBIC ACID) tablet 500 mg  500 mg Oral Daily Derrill Kay A, MD   500 mg at 11/27/18 0908  . zinc sulfate capsule 220 mg  220 mg Oral Daily Phillips Grout, MD   220 mg at 11/27/18 [id number]     Discharge Medications: Please see discharge summary for a list of discharge medications.  Relevant Imaging Results:  Relevant Lab Results:   Additional Information SSN [id number]  Joaquin Courts, RN

## 2018-11-28 NOTE — Progress Notes (Signed)
   11/28/18 1823  Family/Significant Other Communication  Family/Significant Other Update Called;Updated (Relative Darlene)

## 2018-11-28 NOTE — TOC Progression Note (Signed)
Transition of Care [facility]) - Progression Note    Patient Details  Name: Albert [name] MRN: [id number] Date of Birth: 07-Mar-1933  [facility] CM/SW Contact  Joaquin Courts, RN Phone Number: 11/28/2018, 3:54 PM  Clinical Narrative:    CM called patient who is extremely hard of hearing. Patient had difficulty hearing CM but did agree to explore SNF for short term rehab. FL2 completed and faxed out to area facilities, awaiting bed offer.    Expected Discharge Plan: [place] Barriers to Discharge: Continued Medical Work up  Expected Discharge Plan and Services Expected Discharge Plan: [place]   Discharge Planning Services: CM Consult Post Acute Care Choice: [place] arrangements for the past 2 months: Single Family Home Expected Discharge Date: 11/27/18                                     Social Determinants of Health (SDOH) Interventions    Readmission Risk Interventions No flowsheet data found.

## 2018-11-28 NOTE — Progress Notes (Signed)
CBG 91 at this time

## 2018-11-29 LAB — GLUCOSE, CAPILLARY
Glucose-Capillary: 114 mg/dL — ABNORMAL HIGH (ref 70–99)
Glucose-Capillary: 91 mg/dL (ref 70–99)
Glucose-Capillary: 124 mg/dL — ABNORMAL HIGH (ref 70–99)
Glucose-Capillary: 113 mg/dL — ABNORMAL HIGH (ref 70–99)
Glucose-Capillary: 118 mg/dL — ABNORMAL HIGH (ref 70–99)
Glucose-Capillary: 132 mg/dL — ABNORMAL HIGH (ref 70–99)

## 2018-11-29 NOTE — Care Management Important Message (Signed)
Important Message  Patient Details  Name: TALAN GRUSS MRN: [id number] Date of Birth: 1933-02-17   Medicare Important Message Given:  Yes - Important Message mailed due to current National Emergency  Verbal consent obtained due to current National Emergency  Relationship to patient: Other Realative (must comment) Contact Name: Nilda Riggs Call Date: 11/29/18  Time: 1240 Phone: [phone] Outcome: Spoke with contact Important Message mailed to: Other (must enter comment)(emailed to [email or web address])    Dahlila Pfahler P Noa Constante 11/29/2018, 12:41 PM

## 2018-11-29 NOTE — Plan of Care (Signed)
  Problem: Respiratory: Goal: Will maintain a patent airway Outcome: Progressing   Problem: Safety: Goal: Ability to remain free from injury will improve Outcome: Progressing   Problem: Skin Integrity: Goal: Risk for impaired skin integrity will decrease Outcome: Progressing

## 2018-11-29 NOTE — Progress Notes (Signed)
Family updated just prior to shift change, pt's own personal cell phone at beside. Pt arrived to unit approx 1915, is a&ox3 but forgetful, hard of hearing. Bed alarm for safety.

## 2018-11-29 NOTE — Discharge Summary (Signed)
Physician Discharge Summary  MOISE MALZAHN [id number] DOB: Mar 23, 1933 DOA: 11/23/2018  PCP: Patient, No Pcp Per  Admit date: 11/23/2018 Discharge date: 11/29/2018  Admitted From: Home Disposition:  Home  Recommendations for Outpatient Follow-up:  1. Follow up with PCP in 1-2 weeks 2. Please obtain BMP/CBC in one week   Home Health:Yes Equipment/Devices:None  Discharge Condition:Stable CODE STATUS:Full Diet recommendation: Heart Healthy   Brief/Interim Summary: 85 y.o. male past medical history of essential hypertension seen in the ED for weakness that started 3 days prior to admission accompanied by shortness of breath and unable to get out of bed to consume fluids.  She is a poor historian and most of the history was obtained from the ED notes and EHR.  He reports dizziness upon standing.  At [facility] she was found to have a temperature of 101 blood pressure 102/63 and a pulse of 100 respiratory rate of 16.  CT chest was done that showed groundglass opacity SARS-CoV-2 PCR was positive on 11/22/2018   Discharge Diagnoses:  Principal Problem:   Pneumonia due to COVID-19 virus Active Problems:   AKI (acute kidney injury) ([place])   Elevated troponin   Benign essential hypertension   Generalized weakness   Dehydration   CKD (chronic kidney disease), stage III Generalized weakness, dehydration due to COVID-19 pneumonia: He was started on IV remdesivir and steroids. His diarrhea resolved, he completed his course of IV remdesivir and steroids in house.  Dehydration slight acute kidney injury on chronic kidney disease stage III: With a baseline creatinine of 1.2 on admission 1.6 likely prerenal azotemia start on IV fluid hydration his creatinine improved to baseline.  Elevated troponins: Setting of chronic renal disease due to stress in the setting of infectious etiology.  Denied any chest pain.  Benign essential hypertension: No changes made to his medication.  New watery  stool: Initially constipated he was placed on MiraLAX and had mild diarrhea which resolved after the MiraLAX was stopped.  Discharge Instructions  Discharge Instructions    Diet - low sodium heart healthy   Complete by: As directed    Increase activity slowly   Complete by: As directed      Allergies as of 11/29/2018   No Known Allergies     Medication List    TAKE these medications   aspirin EC 81 MG tablet Take 81 mg by mouth daily.   atorvastatin 20 MG tablet Commonly known as: LIPITOR Take 20 mg by mouth daily.   celecoxib 200 MG capsule Commonly known as: CELEBREX Take 200 mg by mouth daily.   hydrochlorothiazide 25 MG tablet Commonly known as: HYDRODIURIL Take 25 mg by mouth daily.       No Known Allergies  Consultations:  None   Procedures/Studies: Ct Head Wo Contrast  Result Date: 11/22/2018 CLINICAL DATA:  Unexplained altered level of consciousness. Weakness and dizziness. Duration of symptoms 3 days. EXAM: CT HEAD WITHOUT CONTRAST TECHNIQUE: Contiguous axial images were obtained from the base of the skull through the vertex without intravenous contrast. COMPARISON:  None. FINDINGS: Brain: Age related volume loss. Mild chronic appearing small vessel change of the hemispheric white matter. No sign of acute infarction, mass lesion, hemorrhage, hydrocephalus or extra-axial collection. Vascular: There is atherosclerotic calcification of the major vessels at the base of the brain. Skull: Negative Sinuses/Orbits: Clear/normal Other: None IMPRESSION: No acute finding. Ordinary mild age related volume loss and small-vessel change of the white matter. Electronically Signed   By: Jan Fireman.D.  On: 11/22/2018 13:53   Ct Angio Chest Pe W And/or Wo Contrast  Addendum Date: 11/22/2018   ADDENDUM REPORT: 11/22/2018 16:54 ADDENDUM: Ascending thoracic aortic dilatation to 4.1 cm. Recommend annual imaging followup by CTA or MRA. This recommendation follows 2010  ACCF/AHA/AATS/ACR/ASA/SCA/SCAI/SIR/STS/SVM Guidelines for the Diagnosis and Management of Patients with Thoracic Aortic Disease. Circulation. 2010; [id number]. Aortic aneurysm NOS (ICD10-I71.9) This addendum was called by telephone at the time of dictation on 11/22/2018 at 4:53 pm to provider Dr. Alford Highland, Who verbally acknowledged these results. Electronically Signed   By: Lovena Le M.D.   On: 11/22/2018 16:54   Result Date: 11/22/2018 CLINICAL DATA:  Chest pain, complex, high probability of ACS/PE/acute aortic syndrome EXAM: CT ANGIOGRAPHY CHEST WITH CONTRAST TECHNIQUE: Multidetector CT imaging of the chest was performed using the standard protocol during bolus administration of intravenous contrast. Multiplanar CT image reconstructions and MIPs were obtained to evaluate the vascular anatomy. CONTRAST:  31mL OMNIPAQUE IOHEXOL 350 MG/ML SOLN COMPARISON:  Chest radiograph 11/22/2018 FINDINGS: Cardiovascular: Motion artifact limits evaluation of the pulmonary arteries beyond the the lobar level. No central or lobar filling defects are seen. Central pulmonary arteries are normal caliber. Mild cardiomegaly. Extensive coronary artery calcifications are noted. Dense mitral annular calcification is seen as well. No pericardial effusion. Borderline dilation of the ascending thoracic aorta 4.1 cm. Atherosclerotic calcifications are present throughout the thoracic aorta and proximal great vessels. Normal 3 vessel branching of the aortic arch. Mediastinum/Nodes: Scattered low-attenuation borderline enlarged mediastinal and hilar nodes are present including a 11 mm subcarinal lymph node (4/43) and 11 mm right hilar lymph node (4/42). No acute abnormality of the trachea. Posterior bowing compatible with imaging during exhalation. Slightly heterogeneous appearance of the thyroid gland with few punctate calcifications in several hypoattenuating nodules with slight enlargement of the left lobe. Small hiatal hernia. No  acute esophageal abnormality. Lungs/Pleura: There are multifocal areas of ground-glass attenuation with an appearance conspicuous for underlying infection or developing consolidation superimposed on areas of atelectasis. There is diffuse airways thickening. No suspicious nodules or masses. No pneumothorax or effusion. Upper Abdomen: No acute abnormalities present in the visualized portions of the upper abdomen. Punctate benign-appearing calcification head Musculoskeletal: Exaggeration of the normal thoracic kyphosis with multilevel bridging syndesmophytes across the spine suggesting some degree of ankylosis. No acute or suspicious osseous lesions are seen. There is an increased AP diameter of the chest associated with the exaggerated curvature. Additional discogenic changes are present in the cervical spine as well. Review of the MIP images confirms the above findings. IMPRESSION: 1. Motion artifact limits evaluation of the pulmonary arteries beyond the lobar level. No central or lobar filling defects are seen. 2. Multifocal areas of ground-glass attenuation with an appearance conspicuous for underlying infection or developing consolidation superimposed on areas of atelectasis. 3. Scattered borderline enlarged mediastinal and hilar nodes, nonspecific, but likely reactive. 4. Exaggerated thoracic kyphosis and bridging syndesmophyte formation conspicuous for ankylosing spondylitis. 5. Aortic Atherosclerosis (ICD10-I70.0). Electronically Signed: By: Lovena Le M.D. On: 11/22/2018 14:46   Dg Chest Portable 1 View  Result Date: 11/22/2018 CLINICAL DATA:  Shortness of breath EXAM: PORTABLE CHEST 1 VIEW COMPARISON:  None. FINDINGS: There is apparent scarring in the right mid and lower lung zones. There is bibasilar atelectasis. There is no edema or consolidation. Heart size and pulmonary vascularity are normal. No adenopathy. There is aortic atherosclerosis. There is degenerative change in the thoracic spine.  IMPRESSION: No frank edema or consolidation. Bibasilar atelectasis with apparent scarring in the right  mid lower lung zones. Heart size within normal limits. No adenopathy. Aortic Atherosclerosis (ICD10-I70.0). Electronically Signed   By: Lowella Grip III M.D.   On: 11/22/2018 13:24   (Echo, Carotid, EGD, Colonoscopy, ERCP)    Subjective: Feels great able to walk on any difficulties or shortness of breath with a good appetite  Discharge Exam: Vitals:   11/28/18 2102 11/29/18 0422  BP: (!) 97/45 (!) 112/52  Pulse: 76 75  Resp:  16  Temp: 98.8 F (37.1 C) 98.5 F (36.9 C)  SpO2: 96% 97%   Vitals:   11/28/18 0830 11/28/18 1840 11/28/18 2102 11/29/18 0422  BP: (!) 97/49 (!) 103/50 (!) 97/45 (!) 112/52  Pulse: 65 70 76 75  Resp: 16 20  16   Temp: 98.4 F (36.9 C) 98.6 F (37 C) 98.8 F (37.1 C) 98.5 F (36.9 C)  TempSrc: Oral Oral Oral Oral  SpO2: 100% 100% 96% 97%  Weight:      Height:        General: Pt is alert, awake, not in acute distress Cardiovascular: RRR, S1/S2 +, no rubs, no gallops Respiratory: CTA bilaterally, no wheezing, no rhonchi Abdominal: Soft, NT, ND, bowel sounds + Extremities: no edema, no cyanosis    The results of significant diagnostics from this hospitalization (including imaging, microbiology, ancillary and laboratory) are listed below for reference.     Microbiology: Recent Results (from the past 240 hour(s))  SARS CORONAVIRUS 2 (TAT 6-24 HRS) Nasopharyngeal Nasopharyngeal Swab     Status: Abnormal   Collection Time: 11/22/18  1:53 PM   Specimen: Nasopharyngeal Swab  Result Value Ref Range Status   SARS Coronavirus 2 POSITIVE (A) NEGATIVE Final    Comment: (NOTE) SARS-CoV-2 target nucleic acids are DETECTED. The SARS-CoV-2 RNA is generally detectable in upper and lower respiratory specimens during the acute phase of infection. Positive results are indicative of active infection with SARS-CoV-2. Clinical  correlation with patient  history and other diagnostic information is necessary to determine patient infection status. Positive results do  not rule out bacterial infection or co-infection with other viruses. The expected result is Negative. Fact Sheet for Patients: SugarRoll.be Fact Sheet for Healthcare Providers: https://www.woods-mathews.com/ This test is not yet approved or cleared by the [place] FDA and  has been authorized for detection and/or diagnosis of SARS-CoV-2 by FDA under an Emergency Use Authorization (EUA). This EUA will remain  in effect (meaning this test can be used) for the duration of the COVID-19 declaration under Section 564(b)(1) of the Act, 21 U.S.C.  section 360bbb-3(b)(1), unless the authorization is terminated or revoked sooner. Performed at [facility], [place] [address] [place], [place] [address]   Blood culture (routine x 2)     Status: None   Collection Time: 11/22/18  5:49 PM   Specimen: BLOOD  Result Value Ref Range Status   Specimen Description BLOOD RIGHT ANTECUBITAL  Final   Special Requests   Final    BOTTLES DRAWN AEROBIC AND ANAEROBIC Blood Culture adequate volume   Culture   Final    NO GROWTH 5 DAYS Performed at [facility], [address] [place], [place] [address]    Report Status 11/27/2018 FINAL  Final  Blood culture (routine x 2)     Status: None   Collection Time: 11/22/18  5:49 PM   Specimen: BLOOD  Result Value Ref Range Status   Specimen Description BLOOD BLOOD LEFT ARM  Final   Special Requests   Final  BOTTLES DRAWN AEROBIC AND ANAEROBIC Blood Culture results may not be optimal due to an excessive volume of blood received in culture bottles   Culture   Final    NO GROWTH 5 DAYS Performed at [facility], [place] [place], [place] [address]    Report Status 11/27/2018 FINAL  Final     Labs: BNP (last 3 results) No results for input(s): BNP in the last 8760  hours. Basic Metabolic Panel: Recent Labs  Lab 11/22/18 1251 11/22/18 1912 11/23/18 0533 11/24/18 0110 11/25/18 0106 11/26/18 0125 11/27/18 0235 11/28/18 0326  NA 134*  --  135 135 138 140 139 139  K 3.6  --  3.4* 3.5 4.1 4.7 4.5 4.1  CL 95*  --  96* 102 104 107 106 106  CO2 23  --  22 19* 22 22 22 23   GLUCOSE 133*  --  152* 125* 123* 187* 161* 82  BUN 73*  --  73* 79* 74* 66* 55* 51*  CREATININE 1.78* 1.58* 1.61* 1.25* 1.23 1.14 1.20 1.34*  CALCIUM 8.7*  --  8.0* 7.8* 7.9* 8.1* 8.0* 7.7*  MG 2.1 1.8 2.0  --   --   --   --   --   PHOS  --  2.7 4.1  --   --   --   --   --    Liver Function Tests: Recent Labs  Lab 11/24/18 0110 11/25/18 0106 11/26/18 0125 11/27/18 0235 11/28/18 0326  AST 39 40 60* 68* 60*  ALT 14 14 23  35 38  ALKPHOS 65 62 67 73 71  BILITOT 0.9 0.9 0.7 1.1 1.1  PROT 5.3* 5.2* 5.1* 5.2* 5.2*  ALBUMIN 2.2* 2.2* 2.2* 2.4* 2.3*   No results for input(s): LIPASE, AMYLASE in the last 168 hours. No results for input(s): AMMONIA in the last 168 hours. CBC: Recent Labs  Lab 11/24/18 0110 11/25/18 0106 11/26/18 0125 11/27/18 0235 11/28/18 0326  WBC 10.5 10.1 11.0* 11.2* 11.3*  NEUTROABS 9.1* 8.4* 8.7* 8.7* 6.6  HGB 10.3* 9.4* 9.6* 9.4* 9.8*  HCT 29.6* 27.8* 28.2* 28.0* 29.1*  MCV 82.2 83.5 84.9 86.2 87.4  PLT 172 190 200 226 239   Cardiac Enzymes: Recent Labs  Lab 11/22/18 1251 11/23/18 0533  CKTOTAL 65 128   BNP: Invalid input(s): POCBNP CBG: Recent Labs  Lab 11/27/18 1237 11/27/18 1636 11/27/18 2024 11/28/18 0830 11/28/18 1136  GLUCAP 148* 105* 121* 64* 119*   D-Dimer Recent Labs    11/27/18 0235 11/28/18 0326  DDIMER 1.41* 1.42*   Hgb A1c No results for input(s): HGBA1C in the last 72 hours. Lipid Profile No results for input(s): CHOL, HDL, LDLCALC, TRIG, CHOLHDL, LDLDIRECT in the last 72 hours. Thyroid function studies No results for input(s): TSH, T4TOTAL, T3FREE, THYROIDAB in the last 72 hours.  Invalid input(s):  FREET3 Anemia work up No results for input(s): VITAMINB12, FOLATE, FERRITIN, TIBC, IRON, RETICCTPCT in the last 72 hours. Urinalysis    Component Value Date/Time   COLORURINE YELLOW (A) 11/22/2018 1251   APPEARANCEUR HAZY (A) 11/22/2018 1251   LABSPEC 1.015 11/22/2018 1251   PHURINE 5.0 11/22/2018 1251   GLUCOSEU NEGATIVE 11/22/2018 1251   HGBUR SMALL (A) 11/22/2018 1251   BILIRUBINUR NEGATIVE 11/22/2018 1251   KETONESUR NEGATIVE 11/22/2018 1251   PROTEINUR 100 (A) 11/22/2018 1251   NITRITE NEGATIVE 11/22/2018 1251   LEUKOCYTESUR NEGATIVE 11/22/2018 1251   Sepsis Labs Invalid input(s): PROCALCITONIN,  WBC,  LACTICIDVEN Microbiology Recent Results (from the  past 240 hour(s))  SARS CORONAVIRUS 2 (TAT 6-24 HRS) Nasopharyngeal Nasopharyngeal Swab     Status: Abnormal   Collection Time: 11/22/18  1:53 PM   Specimen: Nasopharyngeal Swab  Result Value Ref Range Status   SARS Coronavirus 2 POSITIVE (A) NEGATIVE Final    Comment: (NOTE) SARS-CoV-2 target nucleic acids are DETECTED. The SARS-CoV-2 RNA is generally detectable in upper and lower respiratory specimens during the acute phase of infection. Positive results are indicative of active infection with SARS-CoV-2. Clinical  correlation with patient history and other diagnostic information is necessary to determine patient infection status. Positive results do  not rule out bacterial infection or co-infection with other viruses. The expected result is Negative. Fact Sheet for Patients: SugarRoll.be Fact Sheet for Healthcare Providers: https://www.woods-mathews.com/ This test is not yet approved or cleared by the [place] FDA and  has been authorized for detection and/or diagnosis of SARS-CoV-2 by FDA under an Emergency Use Authorization (EUA). This EUA will remain  in effect (meaning this test can be used) for the duration of the COVID-19 declaration under Section 564(b)(1) of the  Act, 21 U.S.C.  section 360bbb-3(b)(1), unless the authorization is terminated or revoked sooner. Performed at [facility], [place] [address] [place], [place] [address]   Blood culture (routine x 2)     Status: None   Collection Time: 11/22/18  5:49 PM   Specimen: BLOOD  Result Value Ref Range Status   Specimen Description BLOOD RIGHT ANTECUBITAL  Final   Special Requests   Final    BOTTLES DRAWN AEROBIC AND ANAEROBIC Blood Culture adequate volume   Culture   Final    NO GROWTH 5 DAYS Performed at [facility], [address] [place], [place] [address]    Report Status 11/27/2018 FINAL  Final  Blood culture (routine x 2)     Status: None   Collection Time: 11/22/18  5:49 PM   Specimen: BLOOD  Result Value Ref Range Status   Specimen Description BLOOD BLOOD LEFT ARM  Final   Special Requests   Final    BOTTLES DRAWN AEROBIC AND ANAEROBIC Blood Culture results may not be optimal due to an excessive volume of blood received in culture bottles   Culture   Final    NO GROWTH 5 DAYS Performed at [facility], [address], [place], [place] [address]    Report Status 11/27/2018 FINAL  Final     Time coordinating discharge: Over 40 minutes  SIGNED:   Charlynne Cousins, MD  [facility] 11/29/2018, 8:08 AM Pager   If 7PM-7AM, please contact night-coverage [email or web address] Password [email or web address]

## 2018-11-29 NOTE — Progress Notes (Signed)
   11/29/18 1600  PT Visit Information  Last PT Received On 11/29/18  Assistance Needed +1  History of Present Illness 85 y.o. male past medical history of essential hypertension seen in the ED for weakness that started 3 days prior to admission accompanied by shortness of breath and unable to get out of bed to consume fluids. CT chest was done that showed groundglass opacity SARS-CoV-2 PCR was positive on 11/22/2018  Subjective Data  Patient Stated Goal none stated this session  Precautions  Precautions Fall  Restrictions  Weight Bearing Restrictions No  Pain Assessment  Pain Assessment Faces  Faces Pain Scale 6  Pain Location states feels sick to his stomach, then states his knees hurt with mobility from his arthritis, would not elaborate on this but does genuinely seem to have pain with mobility  Pain Intervention(s) Limited activity within patient's tolerance;Monitored during session  Cognition  Arousal/Alertness Awake/alert  Behavior During Therapy [facility] for tasks assessed/performed  Overall Cognitive Status Within Functional Limits for tasks assessed  Bed Mobility  Overal bed mobility Needs Assistance  Bed Mobility Sit to Supine;Supine to Sit  Supine to sit Mod assist  Sit to supine Min assist  General bed mobility comments needs increased assistance this session to get from supine to sit and back to supine again  Transfers  Overall transfer level Needs assistance  Equipment used 1 person hand held assist  Transfers Sit to/from Stand  Sit to Stand Max assist  General transfer comment unable to get to full stance today, states unable to sec to arthritis in knees  Ambulation/Gait  General Gait Details was not able to ambulate this session  Balance  Overall balance assessment Needs assistance  Sitting-balance support Feet unsupported  Sitting balance-Leahy Scale Good  Standing balance support Single extremity supported  Standing balance-Leahy Scale Poor  PT - End of Session   Activity Tolerance Patient limited by fatigue;Patient limited by lethargy;Patient limited by pain  Patient left in bed;with call bell/phone within reach  Nurse Communication Mobility status   PT - Assessment/Plan  PT Plan Current plan remains appropriate  PT Visit Diagnosis Difficulty in walking, not elsewhere classified (R26.2);Muscle weakness (generalized) (M62.81)  PT Frequency (ACUTE ONLY) Min 2X/week  Follow Up Recommendations SNF  PT equipment Rolling walker with 5" wheels;Other (comment)  AM-PAC PT "6 Clicks" Mobility Outcome Measure (Version 2)  Help needed turning from your back to your side while in a flat bed without using bedrails? 3  Help needed moving from lying on your back to sitting on the side of a flat bed without using bedrails? 2  Help needed moving to and from a bed to a chair (including a wheelchair)? 2  Help needed standing up from a chair using your arms (e.g., wheelchair or bedside chair)? 2  Help needed to walk in hospital room? 1  Help needed climbing 3-5 steps with a railing?  1  6 Click Score 11  Consider Recommendation of Discharge To: CIR/SNF/LTACH  PT Goal Progression  Progress towards PT goals Not progressing toward goals - comment (needs increased education and encouragement and c/o pain)  Acute Rehab PT Goals  Time For Goal Achievement 12/09/18  Potential to Achieve Goals Fair  PT Time Calculation  PT Start Time (ACUTE ONLY) 1426  PT Stop Time (ACUTE ONLY) 1449  PT Time Calculation (min) (ACUTE ONLY) 23 min  PT General Charges  $$ ACUTE PT VISIT 1 Visit  PT Treatments  $Therapeutic Activity 23-37 mins

## 2018-11-30 LAB — CREATININE, SERUM
Creatinine, Ser: 1.13 mg/dL (ref 0.61–1.24)
GFR calc Af Amer: >60 mL/min (ref >60)
GFR calc non Af Amer: 60 mL/min — ABNORMAL LOW (ref >60)

## 2018-11-30 LAB — GLUCOSE, CAPILLARY
Glucose-Capillary: 92 mg/dL (ref 70–99)
Glucose-Capillary: 154 mg/dL — ABNORMAL HIGH (ref 70–99)
Glucose-Capillary: 118 mg/dL — ABNORMAL HIGH (ref 70–99)

## 2018-11-30 NOTE — Progress Notes (Signed)
RN left a message at [employer]. No further concerns.

## 2018-11-30 NOTE — Progress Notes (Signed)
Nutrition Follow-up  DOCUMENTATION CODES:   Not applicable  INTERVENTION:    Continue Ensure Enlive po TID, each supplement provides 350 kcal and 20 grams of protein.  Recommend liberalize diet to regular.  Continue MVI with minerals daily  Continue Hormel Shake daily with Breakfast which provides 520 kcals and 22 g of protein and Magic cup BID with lunch and dinner, each supplement provides 290 kcal and 9 grams of protein, automatically on meal trays to optimize nutritional intake.   NUTRITION DIAGNOSIS:   Increased nutrient needs related to acute illness(COVID PNA) as evidenced by estimated needs.  Ongoing   GOAL:   Patient will meet greater than or equal to 90% of their needs   Progressing  MONITOR:   PO intake, Supplement acceptance, Labs, Skin  ASSESSMENT:   85 yo male admitted with COVID 19 PNA. PMH includes HTN.   Currently on a low sodium heart healthy diet. Patient is consuming 25-80% of meals. He is also drinking Ensure Enlive supplements 2-3 times per day. Labs reviewed.  Medications reviewed and include novolog, levemir, MVI, vitamin C, zinc sulfate. Plans for d/c to SNF today.  Diet Order:   Diet Order            Diet - low sodium heart healthy        Diet - low sodium heart healthy        Diet regular Room service appropriate? Yes; Fluid consistency: Thin  Diet effective now              EDUCATION NEEDS:   Not appropriate for education at this time  Skin:  Skin Assessment: Reviewed RN Assessment(MASD to buttocks, scrotum)  Last BM:  11/22  Height:   Ht Readings from Last 1 Encounters:  11/23/18 5\' 9"  (1.753 m)    Weight:   Wt Readings from Last 1 Encounters:  11/23/18 60.4 kg    Ideal Body Weight:  72.7 kg  BMI:  Body mass index is 19.66 kg/m.  Estimated Nutritional Needs:   Kcal:  [id number]  Protein:  85-100 gm  Fluid:  >/= 1.8 L    Molli Barrows, RD, LDN, [place] Pager [phone] After Hours Pager [phone]

## 2018-11-30 NOTE — Discharge Summary (Addendum)
Physician Discharge Summary  Albert [name] [id number] DOB: [birth year] DOA: 11/23/2018  PCP: Patient, No Pcp Per  Admit date: 11/23/2018 Discharge date: 12/01/2018  Admitted From: home Disposition:  SNF  Recommendations for Outpatient Follow-up:  1. Follow up with PCP in 1-2 weeks  Home Health: none Equipment/Devices: none  Discharge Condition: stable CODE STATUS: Full code Diet recommendation: regular  HPI: Per admitting MD, Albert [name] is a 85 y.o. male with medical history significant of htn lives at home comes in for several days of profound weakness and progressive worsening sob.  No n/v/d.  No cp or abd pain.  No fevers.  Mostly extreme weakness.  Not eating and drinking well.  Arrived at [facility] found to be in aki mild with dehydration.  Given ivf and feels much better already.  Ct chest shows bilateral infiltrates 02 sats nml.  Pt referred for admission for covid pna.  Hospital Course / Discharge diagnoses: Generalized weakness, dehydration due to COVID-19 pneumonia -patient was admitted to the hospital and he was started on IV remdesivir and steroids.  He completed a course of steroids and remdesivir and has returned to baseline.  His diarrhea resolved Dehydration slight acute kidney injury on chronic kidney disease stage III -With a baseline creatinine of 1.2 on admission 1.6 likely prerenal azotemia start on IV fluid hydration his creatinine improved to baseline. Elevated troponins -likely demand ischemia, no chest pain Benign essential hypertension - No changes made to his medication.  Discharge Instructions  Discharge Instructions    MyChart COVID-19 home monitoring program   Complete by: Nov 29, 2018    Is the patient willing to use the [facility] for home monitoring?: No   Diet - low sodium heart healthy   Complete by: As directed    Diet - low sodium heart healthy   Complete by: As directed    Increase activity slowly   Complete by: As directed    Increase activity slowly   Complete by: As directed      Allergies as of 12/01/2018   No Known Allergies     Medication List    TAKE these medications   aspirin EC 81 MG tablet Take 81 mg by mouth daily.   atorvastatin 20 MG tablet Commonly known as: LIPITOR Take 20 mg by mouth daily.   celecoxib 200 MG capsule Commonly known as: CELEBREX Take 200 mg by mouth daily.   hydrochlorothiazide 25 MG tablet Commonly known as: HYDRODIURIL Take 25 mg by mouth daily.       Consultations:  None   Procedures/Studies:  Ct Head Wo Contrast  Result Date: 11/22/2018 CLINICAL DATA:  Unexplained altered level of consciousness. Weakness and dizziness. Duration of symptoms 3 days. EXAM: CT HEAD WITHOUT CONTRAST TECHNIQUE: Contiguous axial images were obtained from the base of the skull through the vertex without intravenous contrast. COMPARISON:  None. FINDINGS: Brain: Age related volume loss. Mild chronic appearing small vessel change of the hemispheric white matter. No sign of acute infarction, mass lesion, hemorrhage, hydrocephalus or extra-axial collection. Vascular: There is atherosclerotic calcification of the major vessels at the base of the brain. Skull: Negative Sinuses/Orbits: Clear/normal Other: None IMPRESSION: No acute finding. Ordinary mild age related volume loss and small-vessel change of the white matter. Electronically Signed   By: Nelson Chimes M.D.   On: 11/22/2018 13:53   Ct Angio Chest Pe W And/or Wo Contrast  Addendum Date: 11/22/2018   ADDENDUM REPORT: 11/22/2018 16:54 ADDENDUM: Ascending thoracic aortic  dilatation to 4.1 cm. Recommend annual imaging followup by CTA or MRA. This recommendation follows 2010 ACCF/AHA/AATS/ACR/ASA/SCA/SCAI/SIR/STS/SVM Guidelines for the Diagnosis and Management of Patients with Thoracic Aortic Disease. Circulation. 2010; [id number]. Aortic aneurysm NOS (ICD10-I71.9) This addendum was called by telephone at the time of dictation on  11/22/2018 at 4:53 pm to provider Dr. Alford Highland, Who verbally acknowledged these results. Electronically Signed   By: Lovena Le M.D.   On: 11/22/2018 16:54   Result Date: 11/22/2018 CLINICAL DATA:  Chest pain, complex, high probability of ACS/PE/acute aortic syndrome EXAM: CT ANGIOGRAPHY CHEST WITH CONTRAST TECHNIQUE: Multidetector CT imaging of the chest was performed using the standard protocol during bolus administration of intravenous contrast. Multiplanar CT image reconstructions and MIPs were obtained to evaluate the vascular anatomy. CONTRAST:  39mL OMNIPAQUE IOHEXOL 350 MG/ML SOLN COMPARISON:  Chest radiograph 11/22/2018 FINDINGS: Cardiovascular: Motion artifact limits evaluation of the pulmonary arteries beyond the the lobar level. No central or lobar filling defects are seen. Central pulmonary arteries are normal caliber. Mild cardiomegaly. Extensive coronary artery calcifications are noted. Dense mitral annular calcification is seen as well. No pericardial effusion. Borderline dilation of the ascending thoracic aorta 4.1 cm. Atherosclerotic calcifications are present throughout the thoracic aorta and proximal great vessels. Normal 3 vessel branching of the aortic arch. Mediastinum/Nodes: Scattered low-attenuation borderline enlarged mediastinal and hilar nodes are present including a 11 mm subcarinal lymph node (4/43) and 11 mm right hilar lymph node (4/42). No acute abnormality of the trachea. Posterior bowing compatible with imaging during exhalation. Slightly heterogeneous appearance of the thyroid gland with few punctate calcifications in several hypoattenuating nodules with slight enlargement of the left lobe. Small hiatal hernia. No acute esophageal abnormality. Lungs/Pleura: There are multifocal areas of ground-glass attenuation with an appearance conspicuous for underlying infection or developing consolidation superimposed on areas of atelectasis. There is diffuse airways thickening. No  suspicious nodules or masses. No pneumothorax or effusion. Upper Abdomen: No acute abnormalities present in the visualized portions of the upper abdomen. Punctate benign-appearing calcification head Musculoskeletal: Exaggeration of the normal thoracic kyphosis with multilevel bridging syndesmophytes across the spine suggesting some degree of ankylosis. No acute or suspicious osseous lesions are seen. There is an increased AP diameter of the chest associated with the exaggerated curvature. Additional discogenic changes are present in the cervical spine as well. Review of the MIP images confirms the above findings. IMPRESSION: 1. Motion artifact limits evaluation of the pulmonary arteries beyond the lobar level. No central or lobar filling defects are seen. 2. Multifocal areas of ground-glass attenuation with an appearance conspicuous for underlying infection or developing consolidation superimposed on areas of atelectasis. 3. Scattered borderline enlarged mediastinal and hilar nodes, nonspecific, but likely reactive. 4. Exaggerated thoracic kyphosis and bridging syndesmophyte formation conspicuous for ankylosing spondylitis. 5. Aortic Atherosclerosis (ICD10-I70.0). Electronically Signed: By: Lovena Le M.D. On: 11/22/2018 14:46   Dg Chest Portable 1 View  Result Date: 11/22/2018 CLINICAL DATA:  Shortness of breath EXAM: PORTABLE CHEST 1 VIEW COMPARISON:  None. FINDINGS: There is apparent scarring in the right mid and lower lung zones. There is bibasilar atelectasis. There is no edema or consolidation. Heart size and pulmonary vascularity are normal. No adenopathy. There is aortic atherosclerosis. There is degenerative change in the thoracic spine. IMPRESSION: No frank edema or consolidation. Bibasilar atelectasis with apparent scarring in the right mid lower lung zones. Heart size within normal limits. No adenopathy. Aortic Atherosclerosis (ICD10-I70.0). Electronically Signed   By: Lowella Grip III M.D.    On:  11/22/2018 13:24    Subjective: -No complaints, no changes.  Stable for discharge  Discharge Exam: BP (!) 110/55 (BP Location: Right Arm)    Pulse 76    Temp 98.4 F (36.9 C) (Oral)    Resp 18    Ht 5\' 9"  (1.753 m)    Wt 60.4 kg    SpO2 96%    BMI 19.66 kg/m   General: Pt is alert, awake, not in acute distress Cardiovascular: RRR, S1/S2 +, no rubs, no gallops Respiratory: CTA bilaterally, no wheezing, no rhonchi Abdominal: Soft, NT, ND, bowel sounds + Extremities: no edema, no cyanosis    The results of significant diagnostics from this hospitalization (including imaging, microbiology, ancillary and laboratory) are listed below for reference.     Microbiology: Recent Results (from the past 240 hour(s))  SARS CORONAVIRUS 2 (TAT 6-24 HRS) Nasopharyngeal Nasopharyngeal Swab     Status: Abnormal   Collection Time: 11/22/18  1:53 PM   Specimen: Nasopharyngeal Swab  Result Value Ref Range Status   SARS Coronavirus 2 POSITIVE (A) NEGATIVE Final    Comment: (NOTE) SARS-CoV-2 target nucleic acids are DETECTED. The SARS-CoV-2 RNA is generally detectable in upper and lower respiratory specimens during the acute phase of infection. Positive results are indicative of active infection with SARS-CoV-2. Clinical  correlation with patient history and other diagnostic information is necessary to determine patient infection status. Positive results do  not rule out bacterial infection or co-infection with other viruses. The expected result is Negative. Fact Sheet for Patients: SugarRoll.be Fact Sheet for Healthcare Providers: https://www.woods-mathews.com/ This test is not yet approved or cleared by the [place] FDA and  has been authorized for detection and/or diagnosis of SARS-CoV-2 by FDA under an Emergency Use Authorization (EUA). This EUA will remain  in effect (meaning this test can be used) for the duration of the COVID-19 declaration  under Section 564(b)(1) of the Act, 21 U.S.C.  section 360bbb-3(b)(1), unless the authorization is terminated or revoked sooner. Performed at [facility], [place] [address] [place], [place] [address]   Blood culture (routine x 2)     Status: None   Collection Time: 11/22/18  5:49 PM   Specimen: BLOOD  Result Value Ref Range Status   Specimen Description BLOOD RIGHT ANTECUBITAL  Final   Special Requests   Final    BOTTLES DRAWN AEROBIC AND ANAEROBIC Blood Culture adequate volume   Culture   Final    NO GROWTH 5 DAYS Performed at [facility], [address] [place], [place] [address]    Report Status 11/27/2018 FINAL  Final  Blood culture (routine x 2)     Status: None   Collection Time: 11/22/18  5:49 PM   Specimen: BLOOD  Result Value Ref Range Status   Specimen Description BLOOD BLOOD LEFT ARM  Final   Special Requests   Final    BOTTLES DRAWN AEROBIC AND ANAEROBIC Blood Culture results may not be optimal due to an excessive volume of blood received in culture bottles   Culture   Final    NO GROWTH 5 DAYS Performed at [facility], [address] [place],  [address]    Report Status 11/27/2018 FINAL  Final     Labs: Basic Metabolic Panel: Recent Labs  Lab 11/25/18 0106 11/26/18 0125 11/27/18 0235 11/28/18 0326 11/30/18 0135  NA 138 140 139 139  --   K 4.1 4.7 4.5 4.1  --   CL 104 107 106 106  --  CO2 22 22 22 23   --   GLUCOSE 123* 187* 161* 82  --   BUN 74* 66* 55* 51*  --   CREATININE 1.23 1.14 1.20 1.34* 1.13  CALCIUM 7.9* 8.1* 8.0* 7.7*  --    Liver Function Tests: Recent Labs  Lab 11/25/18 0106 11/26/18 0125 11/27/18 0235 11/28/18 0326  AST 40 60* 68* 60*  ALT 14 23 35 38  ALKPHOS 62 67 73 71  BILITOT 0.9 0.7 1.1 1.1  PROT 5.2* 5.1* 5.2* 5.2*  ALBUMIN 2.2* 2.2* 2.4* 2.3*   CBC: Recent Labs  Lab 11/25/18 0106 11/26/18 0125 11/27/18 0235 11/28/18 0326  WBC 10.1 11.0* 11.2* 11.3*  NEUTROABS 8.4* 8.7* 8.7*  6.6  HGB 9.4* 9.6* 9.4* 9.8*  HCT 27.8* 28.2* 28.0* 29.1*  MCV 83.5 84.9 86.2 87.4  PLT 190 200 226 239   CBG: Recent Labs  Lab 11/29/18 2029 11/30/18 0736 11/30/18 1110 11/30/18 1711 12/01/18 0743  GLUCAP 132* 92 154* 118* 83   Hgb A1c No results for input(s): HGBA1C in the last 72 hours. Lipid Profile No results for input(s): CHOL, HDL, LDLCALC, TRIG, CHOLHDL, LDLDIRECT in the last 72 hours. Thyroid function studies No results for input(s): TSH, T4TOTAL, T3FREE, THYROIDAB in the last 72 hours.  Invalid input(s): FREET3 Urinalysis    Component Value Date/Time   COLORURINE YELLOW (A) 11/22/2018 1251   APPEARANCEUR HAZY (A) 11/22/2018 1251   LABSPEC 1.015 11/22/2018 1251   PHURINE 5.0 11/22/2018 1251   GLUCOSEU NEGATIVE 11/22/2018 1251   HGBUR SMALL (A) 11/22/2018 1251   BILIRUBINUR NEGATIVE 11/22/2018 1251   KETONESUR NEGATIVE 11/22/2018 1251   PROTEINUR 100 (A) 11/22/2018 1251   NITRITE NEGATIVE 11/22/2018 1251   LEUKOCYTESUR NEGATIVE 11/22/2018 1251    FURTHER DISCHARGE INSTRUCTIONS:   Get Medicines reviewed and adjusted: Please take all your medications with you for your next visit with your Primary MD   Laboratory/radiological data: Please request your Primary MD to go over all hospital tests and procedure/radiological results at the follow up, please ask your Primary MD to get all Hospital records sent to his/her office.   In some cases, they will be blood work, cultures and biopsy results pending at the time of your discharge. Please request that your primary care M.D. goes through all the records of your hospital data and follows up on these results.   Also Note the following: If you experience worsening of your admission symptoms, develop shortness of breath, life threatening emergency, suicidal or homicidal thoughts you must seek medical attention immediately by calling 911 or calling your MD immediately  if symptoms less severe.   You must read complete  instructions/literature along with all the possible adverse reactions/side effects for all the Medicines you take and that have been prescribed to you. Take any new Medicines after you have completely understood and accpet all the possible adverse reactions/side effects.    Do not drive when taking Pain medications or sleeping medications (Benzodaizepines)   Do not take more than prescribed Pain, Sleep and Anxiety Medications. It is not advisable to combine anxiety,sleep and pain medications without talking with your primary care practitioner   Special Instructions: If you have smoked or chewed Tobacco  in the last 2 yrs please stop smoking, stop any regular Alcohol  and or any Recreational drug use.   Wear Seat belts while driving.   Please note: You were cared for by a hospitalist during your hospital stay. Once you are discharged, your primary care  physician will handle any further medical issues. Please note that NO REFILLS for any discharge medications will be authorized once you are discharged, as it is imperative that you return to your primary care physician (or establish a relationship with a primary care physician if you do not have one) for your post hospital discharge needs so that they can reassess your need for medications and monitor your lab values.  Time coordinating discharge: 25 minutes  SIGNED:  Marzetta Board, MD, PhD 12/01/2018, 10:26 AM

## 2018-12-01 LAB — GLUCOSE, CAPILLARY
Glucose-Capillary: 153 mg/dL — ABNORMAL HIGH (ref 70–99)
Glucose-Capillary: 83 mg/dL (ref 70–99)

## 2018-12-01 NOTE — TOC Transition Note (Signed)
Transition of Care [facility]) - CM/SW Discharge Note   Patient Details  Name: NIKLAUS DIRENZO MRN: [id number] Date of Birth: 08/18/1933  Transition of Care [facility]) CM/SW Contact:  Ninfa Meeker, RN Phone Number: [phone] (working remotely) 12/01/2018, 10:35 AM   Clinical Narrative:   Patient will discharge to : [place] date: 12/01/18 Family notified: [name]  [phone] Transport by: Corey Harold :  11:30am  Patient is medically ready for discharge per MD. Case manager has notified his family, bedside RN, charge nurse and facility. Discharge Summary, FL2 have been sent to [facility] via the [facility]. Bedside RN to call report to: [phone], he will go to room 704. Medical necessity and face sheet printed to nursing unit. Ambulance transport requested for 11:30am.    Final next level of care: [place] Barriers to Discharge: No Barriers Identified   Patient Goals and CMS Choice Patient states their goals for this hospitalization and ongoing recovery are:: to go to rehab CMS Medicare.gov Compare Post Acute Care list provided to:: Patient Choice offered to / list presented to : Patient  Discharge Placement                       Discharge Plan and Services   Discharge Planning Services: CM Consult Post Acute Care Choice: [place]                               Social Determinants of Health (SDOH) Interventions     Readmission Risk Interventions No flowsheet data found.

## 2018-12-01 NOTE — Progress Notes (Signed)
Report given to SNF. Pt. Stable at discharge.Belongings sent with patient.

## 2019-07-24 ENCOUNTER — Encounter: Payer: Medicare Other | Admitting: Dermatology

## 2019-08-03 ENCOUNTER — Inpatient Hospital Stay
Admission: EM | Admit: 2019-08-03 | Discharge: 2019-08-08 | DRG: 381 | Disposition: A | Discharge status: Home Health | Payer: Medicare Other | Attending: Internal Medicine | Admitting: Internal Medicine

## 2019-08-03 ENCOUNTER — Other Ambulatory Visit: Payer: Self-pay

## 2019-08-03 ENCOUNTER — Emergency Department: Payer: Medicare Other

## 2019-08-03 DIAGNOSIS — K922 Gastrointestinal hemorrhage, unspecified: Secondary | ICD-10-CM | POA: Diagnosis not present

## 2019-08-03 DIAGNOSIS — I129 Hypertensive chronic kidney disease with stage 1 through stage 4 chronic kidney disease, or unspecified chronic kidney disease: Secondary | ICD-10-CM | POA: Diagnosis present

## 2019-08-03 DIAGNOSIS — E872 Acidosis, unspecified: Secondary | ICD-10-CM | POA: Diagnosis present

## 2019-08-03 DIAGNOSIS — D689 Coagulation defect, unspecified: Secondary | ICD-10-CM | POA: Diagnosis present

## 2019-08-03 DIAGNOSIS — D649 Anemia, unspecified: Secondary | ICD-10-CM | POA: Diagnosis present

## 2019-08-03 DIAGNOSIS — Z85828 Personal history of other malignant neoplasm of skin: Secondary | ICD-10-CM

## 2019-08-03 DIAGNOSIS — Z20822 Contact with and (suspected) exposure to covid-19: Secondary | ICD-10-CM | POA: Diagnosis present

## 2019-08-03 DIAGNOSIS — N179 Acute kidney failure, unspecified: Secondary | ICD-10-CM | POA: Diagnosis present

## 2019-08-03 DIAGNOSIS — Z79899 Other long term (current) drug therapy: Secondary | ICD-10-CM

## 2019-08-03 DIAGNOSIS — K2100 Gastro-esophageal reflux disease with esophagitis, without bleeding: Secondary | ICD-10-CM | POA: Diagnosis present

## 2019-08-03 DIAGNOSIS — K449 Diaphragmatic hernia without obstruction or gangrene: Secondary | ICD-10-CM | POA: Diagnosis present

## 2019-08-03 DIAGNOSIS — N1832 Chronic kidney disease, stage 3b: Secondary | ICD-10-CM | POA: Diagnosis present

## 2019-08-03 DIAGNOSIS — R531 Weakness: Secondary | ICD-10-CM

## 2019-08-03 DIAGNOSIS — R6510 Systemic inflammatory response syndrome (SIRS) of non-infectious origin without acute organ dysfunction: Secondary | ICD-10-CM | POA: Diagnosis present

## 2019-08-03 DIAGNOSIS — Z880 Allergy status to penicillin: Secondary | ICD-10-CM

## 2019-08-03 DIAGNOSIS — K5730 Diverticulosis of large intestine without perforation or abscess without bleeding: Secondary | ICD-10-CM | POA: Diagnosis present

## 2019-08-03 DIAGNOSIS — I10 Essential (primary) hypertension: Secondary | ICD-10-CM | POA: Diagnosis present

## 2019-08-03 DIAGNOSIS — K264 Chronic or unspecified duodenal ulcer with hemorrhage: Secondary | ICD-10-CM | POA: Diagnosis present

## 2019-08-03 DIAGNOSIS — Z7982 Long term (current) use of aspirin: Secondary | ICD-10-CM

## 2019-08-03 DIAGNOSIS — K2211 Ulcer of esophagus with bleeding: Principal | ICD-10-CM | POA: Diagnosis present

## 2019-08-03 DIAGNOSIS — I959 Hypotension, unspecified: Secondary | ICD-10-CM | POA: Diagnosis present

## 2019-08-03 DIAGNOSIS — G894 Chronic pain syndrome: Secondary | ICD-10-CM | POA: Diagnosis present

## 2019-08-03 DIAGNOSIS — D62 Acute posthemorrhagic anemia: Secondary | ICD-10-CM | POA: Diagnosis present

## 2019-08-03 DIAGNOSIS — D72829 Elevated white blood cell count, unspecified: Secondary | ICD-10-CM | POA: Diagnosis present

## 2019-08-03 DIAGNOSIS — N1831 Chronic kidney disease, stage 3a: Secondary | ICD-10-CM

## 2019-08-03 LAB — COMPREHENSIVE METABOLIC PANEL
ALT: 13 U/L (ref 0–44)
AST: 42 U/L — ABNORMAL HIGH (ref 15–41)
Albumin: 2.4 g/dL — ABNORMAL LOW (ref 3.5–5.0)
Alkaline Phosphatase: 48 U/L (ref 38–126)
Anion gap: 16 — ABNORMAL HIGH (ref 5–15)
BUN: 121 mg/dL — ABNORMAL HIGH (ref 8–23)
CO2: 16 mmol/L — ABNORMAL LOW (ref 22–32)
Calcium: 7.6 mg/dL — ABNORMAL LOW (ref 8.9–10.3)
Chloride: 107 mmol/L (ref 98–111)
Creatinine, Ser: 2.63 mg/dL — ABNORMAL HIGH (ref 0.61–1.24)
GFR calc Af Amer: 25 mL/min — ABNORMAL LOW (ref >60)
GFR calc non Af Amer: 21 mL/min — ABNORMAL LOW (ref >60)
Glucose, Bld: 170 mg/dL — ABNORMAL HIGH (ref 70–99)
Potassium: 5.1 mmol/L (ref 3.5–5.1)
Sodium: 139 mmol/L (ref 135–145)
Total Bilirubin: 0.9 mg/dL (ref 0.3–1.2)
Total Protein: 4.7 g/dL — ABNORMAL LOW (ref 6.5–8.1)

## 2019-08-03 LAB — PROTIME-INR
INR: 2.2 — ABNORMAL HIGH (ref 0.8–1.2)
Prothrombin Time: 23.5 seconds — ABNORMAL HIGH (ref 11.4–15.2)

## 2019-08-03 LAB — CBC WITH DIFFERENTIAL/PLATELET
Abs Immature Granulocytes: 0.28 10*3/uL — ABNORMAL HIGH (ref 0.00–0.07)
Basophils Absolute: 0 10*3/uL (ref 0.0–0.1)
Basophils Relative: 0 %
Eosinophils Absolute: 0 10*3/uL (ref 0.0–0.5)
Eosinophils Relative: 0 %
HCT: 18.2 % — ABNORMAL LOW (ref 39.0–52.0)
Hemoglobin: 6 g/dL — ABNORMAL LOW (ref 13.0–17.0)
Immature Granulocytes: 2 %
Lymphocytes Relative: 13 %
Lymphs Abs: 2.1 10*3/uL (ref 0.7–4.0)
MCH: 30.9 pg (ref 26.0–34.0)
MCHC: 33 g/dL (ref 30.0–36.0)
MCV: 93.8 fL (ref 80.0–100.0)
Monocytes Absolute: 0.9 10*3/uL (ref 0.1–1.0)
Monocytes Relative: 6 %
Neutro Abs: 12.9 10*3/uL — ABNORMAL HIGH (ref 1.7–7.7)
Neutrophils Relative %: 79 %
Platelets: 211 10*3/uL (ref 150–400)
RBC: 1.94 MIL/uL — ABNORMAL LOW (ref 4.22–5.81)
RDW: 15.9 % — ABNORMAL HIGH (ref 11.5–15.5)
WBC: 16.2 10*3/uL — ABNORMAL HIGH (ref 4.0–10.5)
nRBC: 0.2 % (ref 0.0–0.2)

## 2019-08-03 LAB — CK: Total CK: 221 U/L (ref 49–397)

## 2019-08-03 LAB — PREPARE RBC (CROSSMATCH)

## 2019-08-03 MED ORDER — SODIUM CHLORIDE 0.9 % IV SOLN
80.0000 mg | Freq: Once | INTRAVENOUS | Status: AC
  Administered 2019-08-04: 80 mg via INTRAVENOUS
  Filled 2019-08-03: qty 80

## 2019-08-03 MED ORDER — SODIUM CHLORIDE 0.9 % IV SOLN
10.0000 mL/h | Freq: Once | INTRAVENOUS | Status: AC
  Administered 2019-08-04: 10 mL/h via INTRAVENOUS

## 2019-08-03 MED ORDER — PANTOPRAZOLE SODIUM 40 MG IV SOLR: 40.0000 mg | Freq: Two times a day (BID) | INTRAVENOUS | Status: DC

## 2019-08-03 MED ORDER — LACTATED RINGERS IV BOLUS
1000.0000 mL | Freq: Once | INTRAVENOUS | Status: AC
  Administered 2019-08-03: 1000 mL via INTRAVENOUS

## 2019-08-03 MED ORDER — SODIUM CHLORIDE 0.9 % IV SOLN
8.0000 mg/h | INTRAVENOUS | Status: DC
  Administered 2019-08-04 – 2019-08-05 (×3): 8 mg/h via INTRAVENOUS
  Filled 2019-08-03 (×6): qty 80

## 2019-08-03 NOTE — ED Provider Notes (Signed)
[facility] Emergency Department Provider Note   ____________________________________________   First MD Initiated Contact with Patient 08/03/19 2210     (approximate)  I have reviewed the triage vital signs and the nursing notes.   HISTORY  Chief Complaint Weakness and Nausea    HPI Albert [name] is a 86 y.o. male with past medical history of hypertension and CKD who presents to the ED for GI bleed.  Patient reports that he has been feeling very weak for the past 1 to 2 weeks.  He lives alone and has been unable to get up out of bed for at least the past week.  Last night, he developed diarrhea and has been feeling nauseous, but has not vomited.  He states he did not notice any blood in his stool, but when EMS arrived they found him in a pool of what appeared to be maroon bloody stool.  Patient states he regularly takes Celebrex for chronic pain, denies alcohol abuse or history of liver disease.  He denies any fevers, cough, chest pain, shortness of breath, abdominal pain, dysuria, or hematuria.  He denies any history of GI bleeding.        Past Medical History:  Diagnosis Date  . Actinic keratosis 04/23/2008   L elbow distal tricep (bx proven)  . Basal cell carcinoma 06/17/2006   L preauricular   . Basal cell carcinoma 05/26/2012   L ant neck   . Basal cell carcinoma 11/02/2012   Glabella - excision   . Basal cell carcinoma 08/15/2014   L med ankle   . Basal cell carcinoma 02/23/2017   R lat infrapectoral - excision  . Basal cell carcinoma 12/08/2017   L mid to distal lat volar forearm   . Hypertension   . Squamous cell carcinoma of skin 02/14/2014   R lat low back - ED&C  . Squamous cell carcinoma of skin 10/02/2014   R prox lat bicep - ED&C    Patient Active Problem List   Diagnosis Date Noted  . Dehydration 11/24/2018  . CKD (chronic kidney disease), stage III 11/24/2018  . Pneumonia due to COVID-19 virus 11/23/2018  .  Encephalopathy acute 11/22/2018  . Generalized weakness 11/22/2018  . AKI (acute kidney injury) ([place])   . Elevated troponin   . Pneumonia due to infectious organism   . Benign essential hypertension 06/15/2013    History reviewed. No pertinent surgical history.  Prior to Admission medications   Medication Sig Start Date End Date Taking? Authorizing Provider  aspirin EC 81 MG tablet Take 81 mg by mouth daily.   Yes [provider]  atorvastatin (LIPITOR) 20 MG tablet Take 20 mg by mouth daily. 11/20/18  Yes [provider]  celecoxib (CELEBREX) 200 MG capsule Take 200 mg by mouth daily. 11/20/18  Yes [provider]  hydrochlorothiazide (HYDRODIURIL) 25 MG tablet Take 25 mg by mouth daily. 09/17/18  Yes [provider]    Allergies Patient has no known allergies.  No family history on file.  Social History Social History   Tobacco Use  . Smoking status: Never Smoker  . Smokeless tobacco: Never Used  Vaping Use  . Vaping Use: Never used  Substance Use Topics  . Alcohol use: Not Currently  . Drug use: Never    Review of Systems  Constitutional: No fever/chills.  Positive for generalized weakness. Eyes: No visual changes. ENT: No sore throat. Cardiovascular: Denies chest pain. Respiratory: Denies shortness of breath. Gastrointestinal: No  abdominal pain.  Positive for nausea, no vomiting.  Positive for diarrhea.  No constipation. Genitourinary: Negative for dysuria. Musculoskeletal: Negative for back pain. Skin: Negative for rash. Neurological: Negative for headaches, focal weakness or numbness.  ____________________________________________   PHYSICAL EXAM:  VITAL SIGNS: ED Triage Vitals [08/03/19 2207]  Enc Vitals Group     BP      Pulse      Resp      Temp      Temp src      SpO2      Weight 122 lb (55.3 kg)     Height 5\' 4"  (1.626 m)     Head Circumference      Peak Flow      Pain Score 0     Pain Loc      Pain Edu?       Excl. in [place]?     Constitutional: Alert and oriented.  Pale appearing. Eyes: Conjunctivae are normal. Head: Atraumatic. Nose: No congestion/rhinnorhea. Mouth/Throat: Mucous membranes are dry. Neck: Normal ROM Cardiovascular: Tachycardic, regular rhythm. Grossly normal heart sounds. Respiratory: Normal respiratory effort.  No retractions. Lungs CTAB. Gastrointestinal: Soft and nontender. No distention.  Rectal area and upper legs caked with maroon stool. Genitourinary: deferred Musculoskeletal: No lower extremity tenderness nor edema. Neurologic:  Normal speech and language. No gross focal neurologic deficits are appreciated. Skin:  Skin is warm, dry and intact. No rash noted. Psychiatric: Mood and affect are normal. Speech and behavior are normal.  ____________________________________________   LABS (all labs ordered are listed, but only abnormal results are displayed)  Labs Reviewed  CBC WITH DIFFERENTIAL/PLATELET - Abnormal; Notable for the following components:      Result Value   WBC 16.2 (*)    RBC 1.94 (*)    Hemoglobin 6.0 (*)    HCT 18.2 (*)    RDW 15.9 (*)    Neutro Abs 12.9 (*)    Abs Immature Granulocytes 0.28 (*)    All other components within normal limits  COMPREHENSIVE METABOLIC PANEL - Abnormal; Notable for the following components:   CO2 16 (*)    Glucose, Bld 170 (*)    BUN 121 (*)    Creatinine, Ser 2.63 (*)    Calcium 7.6 (*)    Total Protein 4.7 (*)    Albumin 2.4 (*)    AST 42 (*)    GFR calc non Af Amer 21 (*)    GFR calc Af Amer 25 (*)    Anion gap 16 (*)    All other components within normal limits  PROTIME-INR - Abnormal; Notable for the following components:   Prothrombin Time 23.5 (*)    INR 2.2 (*)    All other components within normal limits  SARS CORONAVIRUS 2 BY RT PCR (HOSPITAL ORDER, [facility])  CK  URINALYSIS, COMPLETE (UACMP) WITH MICROSCOPIC  TYPE AND SCREEN  PREPARE RBC (CROSSMATCH)    ____________________________________________  EKG  ED ECG REPORT I, Blake Divine, the attending physician, personally viewed and interpreted this ECG.   Date: 08/03/2019  EKG Time: 22:27  Rate: 110  Rhythm: sinus tachycardia  Axis: Normal  Intervals:none  ST&T Change: Nonspecific ST/T changes    PROCEDURES  Procedure(s) performed (including Critical Care):  .Critical Care Performed by: Blake Divine, MD Authorized by: Blake Divine, MD   Critical care provider statement:    Critical care time (minutes):  45   Critical care time was exclusive of:  Separately  billable procedures and treating other patients and teaching time   Critical care was necessary to treat or prevent imminent or life-threatening deterioration of the following conditions:  Circulatory failure   Critical care was time spent personally by me on the following activities:  Discussions with consultants, evaluation of patient's response to treatment, examination of patient, ordering and performing treatments and interventions, ordering and review of laboratory studies, ordering and review of radiographic studies, pulse oximetry, re-evaluation of patient's condition, obtaining history from patient or surrogate and review of old charts   I assumed direction of critical care for this patient from another provider in my specialty: no       ____________________________________________   INITIAL IMPRESSION / ASSESSMENT AND PLAN / ED COURSE       86 year old male with possible history of hypertension and CKD presents to the ED complaining of generalized weakness and difficulty getting up out of bed for the past week, subsequently developed what appears to be bloody diarrhea last night.  He is tachycardic upon arrival with borderline low blood pressure, was hydrated with IV fluids with improvement in tachycardia and stabilized blood pressure.  Suspect GI bleed, initially considered lower GI bleed given  appearance of stool, however patient's regular NSAID use puts him at risk for upper GI bleed.  Hemoglobin noted to be low at 6 and we will transfuse 1 unit PRBCs, crossmatch an additional unit.  Blood work shows AKI as well as marked elevation in BUN, raising suspicion for upper GI bleed.  We will start patient on Protonix drip, hold off on CTA angiogram for now given arterial lower GI bleed seems less likely.  Patient remains hemodynamically stable, case discussed with GI who recommends giving vitamin K for his elevated INR of unclear etiology. He does not have any history of liver disease and LFTs are within normal limits. Case discussed with hospitalist for admission.     ____________________________________________   FINAL CLINICAL IMPRESSION(S) / ED DIAGNOSES  Final diagnoses:  Gastrointestinal hemorrhage, unspecified gastrointestinal hemorrhage type  Generalized weakness     ED Discharge Orders    None       Note:  This document was prepared using Dragon voice recognition software and may include unintentional dictation errors.   Blake Divine, MD 08/04/19 [phone]

## 2019-08-03 NOTE — ED Triage Notes (Signed)
Pt arrives [facility] from home w cc of n/v/d and weakness. Pt reports symptoms started last evening, unable to get up since noon today. Pt covered in dark red stool.   16G R AC

## 2019-08-04 ENCOUNTER — Inpatient Hospital Stay: Payer: Medicare Other

## 2019-08-04 ENCOUNTER — Encounter: Payer: Self-pay | Admitting: Family Medicine

## 2019-08-04 DIAGNOSIS — Z20822 Contact with and (suspected) exposure to covid-19: Secondary | ICD-10-CM | POA: Diagnosis present

## 2019-08-04 DIAGNOSIS — Z85828 Personal history of other malignant neoplasm of skin: Secondary | ICD-10-CM | POA: Diagnosis not present

## 2019-08-04 DIAGNOSIS — R6510 Systemic inflammatory response syndrome (SIRS) of non-infectious origin without acute organ dysfunction: Secondary | ICD-10-CM | POA: Diagnosis present

## 2019-08-04 DIAGNOSIS — E872 Acidosis, unspecified: Secondary | ICD-10-CM | POA: Diagnosis present

## 2019-08-04 DIAGNOSIS — N1832 Chronic kidney disease, stage 3b: Secondary | ICD-10-CM | POA: Diagnosis present

## 2019-08-04 DIAGNOSIS — Z79899 Other long term (current) drug therapy: Secondary | ICD-10-CM | POA: Diagnosis not present

## 2019-08-04 DIAGNOSIS — K5730 Diverticulosis of large intestine without perforation or abscess without bleeding: Secondary | ICD-10-CM | POA: Diagnosis present

## 2019-08-04 DIAGNOSIS — K922 Gastrointestinal hemorrhage, unspecified: Secondary | ICD-10-CM | POA: Diagnosis present

## 2019-08-04 DIAGNOSIS — Z880 Allergy status to penicillin: Secondary | ICD-10-CM | POA: Diagnosis not present

## 2019-08-04 DIAGNOSIS — D72829 Elevated white blood cell count, unspecified: Secondary | ICD-10-CM | POA: Diagnosis present

## 2019-08-04 DIAGNOSIS — I129 Hypertensive chronic kidney disease with stage 1 through stage 4 chronic kidney disease, or unspecified chronic kidney disease: Secondary | ICD-10-CM | POA: Diagnosis present

## 2019-08-04 DIAGNOSIS — N179 Acute kidney failure, unspecified: Secondary | ICD-10-CM | POA: Diagnosis present

## 2019-08-04 DIAGNOSIS — K264 Chronic or unspecified duodenal ulcer with hemorrhage: Secondary | ICD-10-CM | POA: Diagnosis present

## 2019-08-04 DIAGNOSIS — K2211 Ulcer of esophagus with bleeding: Secondary | ICD-10-CM | POA: Diagnosis present

## 2019-08-04 DIAGNOSIS — I340 Nonrheumatic mitral (valve) insufficiency: Secondary | ICD-10-CM | POA: Diagnosis not present

## 2019-08-04 DIAGNOSIS — I959 Hypotension, unspecified: Secondary | ICD-10-CM | POA: Diagnosis present

## 2019-08-04 DIAGNOSIS — K2100 Gastro-esophageal reflux disease with esophagitis, without bleeding: Secondary | ICD-10-CM | POA: Diagnosis present

## 2019-08-04 DIAGNOSIS — I350 Nonrheumatic aortic (valve) stenosis: Secondary | ICD-10-CM | POA: Diagnosis not present

## 2019-08-04 DIAGNOSIS — D689 Coagulation defect, unspecified: Secondary | ICD-10-CM | POA: Diagnosis present

## 2019-08-04 DIAGNOSIS — D62 Acute posthemorrhagic anemia: Secondary | ICD-10-CM | POA: Diagnosis present

## 2019-08-04 DIAGNOSIS — Z7982 Long term (current) use of aspirin: Secondary | ICD-10-CM | POA: Diagnosis not present

## 2019-08-04 DIAGNOSIS — I351 Nonrheumatic aortic (valve) insufficiency: Secondary | ICD-10-CM | POA: Diagnosis not present

## 2019-08-04 DIAGNOSIS — G894 Chronic pain syndrome: Secondary | ICD-10-CM | POA: Diagnosis present

## 2019-08-04 DIAGNOSIS — K449 Diaphragmatic hernia without obstruction or gangrene: Secondary | ICD-10-CM | POA: Diagnosis present

## 2019-08-04 DIAGNOSIS — D649 Anemia, unspecified: Secondary | ICD-10-CM | POA: Diagnosis present

## 2019-08-04 LAB — HEPATIC FUNCTION PANEL
ALT: 9 U/L (ref 0–44)
AST: 23 U/L (ref 15–41)
Albumin: 1.6 g/dL — ABNORMAL LOW (ref 3.5–5.0)
Alkaline Phosphatase: 29 U/L — ABNORMAL LOW (ref 38–126)
Bilirubin, Direct: 0.2 mg/dL (ref 0.0–0.2)
Indirect Bilirubin: 0.6 mg/dL (ref 0.3–0.9)
Total Bilirubin: 0.8 mg/dL (ref 0.3–1.2)
Total Protein: <3 g/dL — ABNORMAL LOW (ref 6.5–8.1)

## 2019-08-04 LAB — TECHNOLOGIST SMEAR REVIEW

## 2019-08-04 LAB — BASIC METABOLIC PANEL
Anion gap: 16 — ABNORMAL HIGH (ref 5–15)
BUN: 120 mg/dL — ABNORMAL HIGH (ref 8–23)
CO2: 24 mmol/L (ref 22–32)
Calcium: 7.3 mg/dL — ABNORMAL LOW (ref 8.9–10.3)
Chloride: 101 mmol/L (ref 98–111)
Creatinine, Ser: 2.77 mg/dL — ABNORMAL HIGH (ref 0.61–1.24)
GFR calc Af Amer: 23 mL/min — ABNORMAL LOW (ref >60)
GFR calc non Af Amer: 20 mL/min — ABNORMAL LOW (ref >60)
Glucose, Bld: 106 mg/dL — ABNORMAL HIGH (ref 70–99)
Potassium: 4.2 mmol/L (ref 3.5–5.1)
Sodium: 141 mmol/L (ref 135–145)

## 2019-08-04 LAB — URINALYSIS, COMPLETE (UACMP) WITH MICROSCOPIC
Bacteria, UA: NONE SEEN
Bilirubin Urine: NEGATIVE
Glucose, UA: NEGATIVE mg/dL
Hgb urine dipstick: NEGATIVE
Ketones, ur: NEGATIVE mg/dL
Nitrite: NEGATIVE
Protein, ur: NEGATIVE mg/dL
Specific Gravity, Urine: 1.015 (ref 1.005–1.030)
pH: 5 (ref 5.0–8.0)

## 2019-08-04 LAB — CBC
HCT: 16.3 % — ABNORMAL LOW (ref 39.0–52.0)
HCT: 19.7 % — ABNORMAL LOW (ref 39.0–52.0)
Hemoglobin: 5.7 g/dL — ABNORMAL LOW (ref 13.0–17.0)
Hemoglobin: 7.1 g/dL — ABNORMAL LOW (ref 13.0–17.0)
MCH: 30.5 pg (ref 26.0–34.0)
MCH: 31.1 pg (ref 26.0–34.0)
MCHC: 35 g/dL (ref 30.0–36.0)
MCHC: 36 g/dL (ref 30.0–36.0)
MCV: 87.2 fL (ref 80.0–100.0)
MCV: 86.4 fL (ref 80.0–100.0)
Platelets: 123 10*3/uL — ABNORMAL LOW (ref 150–400)
Platelets: 122 10*3/uL — ABNORMAL LOW (ref 150–400)
RBC: 1.87 MIL/uL — ABNORMAL LOW (ref 4.22–5.81)
RBC: 2.28 MIL/uL — ABNORMAL LOW (ref 4.22–5.81)
RDW: 15.1 % (ref 11.5–15.5)
RDW: 15.3 % (ref 11.5–15.5)
WBC: 13.6 10*3/uL — ABNORMAL HIGH (ref 4.0–10.5)
WBC: 10.5 10*3/uL (ref 4.0–10.5)
nRBC: 0.2 % (ref 0.0–0.2)
nRBC: 0.6 % — ABNORMAL HIGH (ref 0.0–0.2)

## 2019-08-04 LAB — HEMOGLOBIN AND HEMATOCRIT, BLOOD
HCT: 20 % — ABNORMAL LOW (ref 39.0–52.0)
Hemoglobin: 6.8 g/dL — ABNORMAL LOW (ref 13.0–17.0)

## 2019-08-04 LAB — FIBRIN DERIVATIVES D-DIMER (ARMC ONLY): Fibrin derivatives D-dimer (ARMC): 726.11 ng/mL (FEU) — ABNORMAL HIGH (ref 0.00–499.00)

## 2019-08-04 LAB — APTT: aPTT: 36 seconds (ref 24–36)

## 2019-08-04 LAB — PROTIME-INR
INR: 1.8 — ABNORMAL HIGH (ref 0.8–1.2)
Prothrombin Time: 20.1 seconds — ABNORMAL HIGH (ref 11.4–15.2)

## 2019-08-04 LAB — FIBRINOGEN: Fibrinogen: 198 mg/dL — ABNORMAL LOW (ref 210–475)

## 2019-08-04 LAB — PREPARE RBC (CROSSMATCH)

## 2019-08-04 LAB — SARS CORONAVIRUS 2 BY RT PCR (HOSPITAL ORDER, PERFORMED IN ~~LOC~~ HOSPITAL LAB): SARS Coronavirus 2: NEGATIVE

## 2019-08-04 LAB — SODIUM, URINE, RANDOM: Sodium, Ur: 14 mmol/L

## 2019-08-04 LAB — CREATININE, URINE, RANDOM: Creatinine, Urine: 101 mg/dL

## 2019-08-04 LAB — MAGNESIUM: Magnesium: 1.2 mg/dL — ABNORMAL LOW (ref 1.7–2.4)

## 2019-08-04 MED ORDER — STERILE WATER FOR INJECTION IV SOLN
INTRAVENOUS | Status: AC
  Filled 2019-08-04: qty 850
  Filled 2019-08-04: qty 150

## 2019-08-04 MED ORDER — FENTANYL CITRATE (PF) 100 MCG/2ML IJ SOLN
12.5000 ug | INTRAMUSCULAR | Status: DC | PRN
  Administered 2019-08-05: 12.5 ug via INTRAVENOUS
  Filled 2019-08-04: qty 2

## 2019-08-04 MED ORDER — ONDANSETRON HCL 4 MG PO TABS: 4.0000 mg | ORAL_TABLET | Freq: Four times a day (QID) | ORAL | Status: DC | PRN

## 2019-08-04 MED ORDER — SODIUM CHLORIDE 0.9 % IV SOLN: Freq: Once | INTRAVENOUS | Status: AC

## 2019-08-04 MED ORDER — VITAMIN K1 10 MG/ML IJ SOLN
5.0000 mg | Freq: Once | INTRAVENOUS | Status: AC
  Administered 2019-08-04: 5 mg via INTRAVENOUS
  Filled 2019-08-04: qty 0.5

## 2019-08-04 MED ORDER — SODIUM CHLORIDE 0.9% FLUSH
3.0000 mL | Freq: Two times a day (BID) | INTRAVENOUS | Status: DC
  Administered 2019-08-04 – 2019-08-08 (×6): 3 mL via INTRAVENOUS

## 2019-08-04 MED ORDER — ONDANSETRON HCL 4 MG/2ML IJ SOLN: 4.0000 mg | Freq: Four times a day (QID) | INTRAMUSCULAR | Status: DC | PRN

## 2019-08-04 MED ORDER — PEG 3350-KCL-NA BICARB-NACL 420 G PO SOLR
4000.0000 mL | Freq: Once | ORAL | Status: AC
  Administered 2019-08-04: 4000 mL via ORAL
  Filled 2019-08-04 (×2): qty 4000

## 2019-08-04 NOTE — H&P (Signed)
History and Physical    Albert [name] DOB: July 14, 1933 DOA: 08/03/2019  PCP: [name], MD   Patient coming from: Home   Chief Complaint: General weakness   HPI: Albert [name] is a 86 y.o. male with medical history significant for hypertension, chronic kidney disease stage IIIa, and chronic pain, now presenting to the emergency department with generalized weakness and GI bleeding.  Patient reports that he began to feel weak in general approximately 1 week ago, progressed to the point where he had difficulty even getting out of bed today.  Yesterday, he developed some vague abdominal discomfort with nausea, dry heaving, and diarrhea.  Patient denied seeing any blood in his stool but EMS reports finding him in a pool of maroon blood.  Patient denies any alcohol use or history of liver disease.  He takes aspirin and Celebrex daily, and does not take a PPI or H2 blocker.  He denies any chest pain, fevers, chills, cough, shortness of breath, or leg swelling or tenderness.  ED Course: Upon arrival to the ED, patient is found to be afebrile, saturating well on room air, mildly tachycardic, and with systolic blood pressure in the 90s.  EKG features sinus tachycardia with rate 110, PVCs, and T wave inversions.  Chest x-ray is negative for acute cardiopulmonary disease.  Chemistry panel concerning for bicarbonate of 16, BUN 121, and creatinine 2.63, up from 1.3 in April.  CBC is notable for a chronic leukocytosis and a new anemia with hemoglobin 6.0, down from 12.1 in April.  INR is elevated to 2.2.  Gastroenterology was consulted by the ED physician and the patient was given a liter of lactated Ringer's, IV vitamin K, IV Protonix, and 1 unit of red blood cells.  Review of Systems:  All other systems reviewed and apart from HPI, are negative.  Past Medical History:  Diagnosis Date  . Actinic keratosis 04/23/2008   L elbow distal tricep (bx proven)  . Basal cell carcinoma 06/17/2006    L preauricular   . Basal cell carcinoma 05/26/2012   L ant neck   . Basal cell carcinoma 11/02/2012   Glabella - excision   . Basal cell carcinoma 08/15/2014   L med ankle   . Basal cell carcinoma 02/23/2017   R lat infrapectoral - excision  . Basal cell carcinoma 12/08/2017   L mid to distal lat volar forearm   . Hypertension   . Squamous cell carcinoma of skin 02/14/2014   R lat low back - ED&C  . Squamous cell carcinoma of skin 10/02/2014   R prox lat bicep - ED&C    History reviewed. No pertinent surgical history.  Social History:   reports that he has never smoked. He has never used smokeless tobacco. He reports previous alcohol use. He reports that he does not use drugs.  No Known Allergies  History reviewed. No pertinent family history.   Prior to Admission medications   Medication Sig Start Date End Date Taking? Authorizing Provider  aspirin EC 81 MG tablet Take 81 mg by mouth daily.   Yes [provider]  atorvastatin (LIPITOR) 20 MG tablet Take 20 mg by mouth daily. 11/20/18  Yes [provider]  celecoxib (CELEBREX) 200 MG capsule Take 200 mg by mouth daily. 11/20/18  Yes [provider]  hydrochlorothiazide (HYDRODIURIL) 25 MG tablet Take 25 mg by mouth daily. 09/17/18  Yes [provider]    Physical Exam: Vitals:   08/03/19 2207 08/03/19 2238 08/04/19  0013 08/04/19 0028  BP:  (!) 98/59 (!) 141/105 (!) 138/104  Pulse:  99 102 103  Resp:  20 22 19   Temp:  (!) 96.1 F (35.6 C) 97.6 F (36.4 C) (!) 97.5 F (36.4 C)  TempSrc:  Oral Oral Oral  SpO2:  99%  100%  Weight: 55.3 kg     Height: 5\' 4"  (1.626 m)       Constitutional: NAD, calm  Eyes: PERTLA, lids and conjunctivae normal ENMT: Mucous membranes are moist. Posterior pharynx clear of any exudate or lesions.   Neck: normal, supple, no masses, no thyromegaly Respiratory:  no wheezing, no crackles. No accessory muscle use.  Cardiovascular: S1 & S2 heard, regular  rate and rhythm. No extremity edema.   Abdomen: No distension, no tenderness, soft. Bowel sounds active.  Musculoskeletal: no clubbing / cyanosis. No joint deformity upper and lower extremities.   Skin: Seborrheic dermatitis and excoriations. Warm, dry, well-perfused. Neurologic: CN 2-12 grossly intact. Sensation intact. Moving all extremities.  Psychiatric: Alert and oriented to person, place, and situation. Pleasant and cooperative.    Labs and Imaging on Admission: I have personally reviewed following labs and imaging studies  CBC: Recent Labs  Lab 08/03/19 2214  WBC 16.2*  NEUTROABS 12.9*  HGB 6.0*  HCT 18.2*  MCV 93.8  PLT 732   Basic Metabolic Panel: Recent Labs  Lab 08/03/19 2214  NA 139  K 5.1  CL 107  CO2 16*  GLUCOSE 170*  BUN 121*  CREATININE 2.63*  CALCIUM 7.6*   GFR: Estimated Creatinine Clearance: 16.4 mL/min (A) (by C-G formula based on SCr of 2.63 mg/dL (H)). Liver Function Tests: Recent Labs  Lab 08/03/19 2214  AST 42*  ALT 13  ALKPHOS 48  BILITOT 0.9  PROT 4.7*  ALBUMIN 2.4*   No results for input(s): LIPASE, AMYLASE in the last 168 hours. No results for input(s): AMMONIA in the last 168 hours. Coagulation Profile: Recent Labs  Lab 08/03/19 2214  INR 2.2*   Cardiac Enzymes: Recent Labs  Lab 08/03/19 2218  CKTOTAL 221   BNP (last 3 results) No results for input(s): PROBNP in the last 8760 hours. HbA1C: No results for input(s): HGBA1C in the last 72 hours. CBG: No results for input(s): GLUCAP in the last 168 hours. Lipid Profile: No results for input(s): CHOL, HDL, LDLCALC, TRIG, CHOLHDL, LDLDIRECT in the last 72 hours. Thyroid Function Tests: No results for input(s): TSH, T4TOTAL, FREET4, T3FREE, THYROIDAB in the last 72 hours. Anemia Panel: No results for input(s): VITAMINB12, FOLATE, FERRITIN, TIBC, IRON, RETICCTPCT in the last 72 hours. Urine analysis:    Component Value Date/Time   COLORURINE YELLOW (A) 11/22/2018 1251     APPEARANCEUR HAZY (A) 11/22/2018 1251   LABSPEC 1.015 11/22/2018 1251   PHURINE 5.0 11/22/2018 1251   GLUCOSEU NEGATIVE 11/22/2018 1251   HGBUR SMALL (A) 11/22/2018 1251   BILIRUBINUR NEGATIVE 11/22/2018 1251   KETONESUR NEGATIVE 11/22/2018 1251   PROTEINUR 100 (A) 11/22/2018 1251   NITRITE NEGATIVE 11/22/2018 1251   LEUKOCYTESUR NEGATIVE 11/22/2018 1251   Sepsis Labs: @LABRCNTIP (procalcitonin:4,lacticidven:4) ) Recent Results (from the past 240 hour(s))  SARS Coronavirus 2 by RT PCR (hospital order, performed in [place] hospital lab) Nasopharyngeal Nasopharyngeal Swab     Status: None   Collection Time: 08/03/19 10:53 PM   Specimen: Nasopharyngeal Swab  Result Value Ref Range Status   SARS Coronavirus 2 NEGATIVE NEGATIVE Final    Comment: (NOTE) SARS-CoV-2 target nucleic acids are NOT DETECTED.  The SARS-CoV-2 RNA is generally detectable in upper and lower respiratory specimens during the acute phase of infection. The lowest concentration of SARS-CoV-2 viral copies this assay can detect is 250 copies / mL. A negative result does not preclude SARS-CoV-2 infection and should not be used as the sole basis for treatment or other patient management decisions.  A negative result may occur with improper specimen collection / handling, submission of specimen other than nasopharyngeal swab, presence of viral mutation(s) within the areas targeted by this assay, and inadequate number of viral copies (<250 copies / mL). A negative result must be combined with clinical observations, patient history, and epidemiological information.  Fact Sheet for Patients:   StrictlyIdeas.no  Fact Sheet for Healthcare Providers: BankingDealers.co.za  This test is not yet approved or  cleared by the [place] FDA and has been authorized for detection and/or diagnosis of SARS-CoV-2 by FDA under an Emergency Use Authorization (EUA).  This EUA will  remain in effect (meaning this test can be used) for the duration of the COVID-19 declaration under Section 564(b)(1) of the Act, 21 U.S.C. section 360bbb-3(b)(1), unless the authorization is terminated or revoked sooner.  Performed at [facility], [address] [place], [place] [address]      Radiological Exams on Admission: DG Chest Portable 1 View  Result Date: 08/03/2019 CLINICAL DATA:  Weakness EXAM: PORTABLE CHEST 1 VIEW COMPARISON:  11/22/2018 FINDINGS: No focal opacity or pleural effusion. Probable scarring in the left upper lung. Normal cardiomediastinal silhouette with aortic atherosclerosis. No pneumothorax. IMPRESSION: No active disease. Electronically Signed   By: Donavan Foil M.D.   On: 08/03/2019 23:20    EKG: Independently reviewed. Sinus tachycardia, rate 110, PVCs, T-wave inversions.   Assessment/Plan   1. Acute GI bleeding; symptomatic anemia  - Presents with ~1 week of generalized weakness, 1 day of nausea with dry heaves and diarrhea, and is found to have maroon stool and Hgb of 6.0, down from 12.1 in April 2021  - BUN is 121 and while creatinine is also up, this is concerning for upper source  - IV PPI, fluid resuscitation, and transfusion of RBCs started in ED   - GI consulting and much appreciated  - Continue IV PPI, continue IVF, check post-transfusion H&H and INR, keep NPO   2. Acute kidney injury superimposed on CKD IIIa; metabolic acidosis  - SCr is 2.63 on admission, up from 1.3 in April 2021  - Potassium is normal, BUN 125, and bicarbonate 16  - Likely acute prerenal azotemia in setting of diarrhea, blood-loss, and diuretic use; he also takes NSAID regularly  - Check urine chemistries and renal US, continue IVF with isotonic bicarbonate, renally-dose medications, and repeat chem panel in am   3. Coagulopathy  - INR is 2.2 in ED; patient denies anticoagulant use or hx of liver disease  - Platelet count is normal, will check aPTT,  fibrinogen, d-dimer, and request smear review  - 10 mg IV vit K is being given, INR will be repeated after transfusion and FFP given if still elevated   4. Hypertension  - SBP 90s in ED in setting of acute GIB and antihypertensives held on admission    5. SIRS  - WBC and HR elevated in ED with low BP  - Doubt sepsis as there is no fever or apparent infectious process, leukocytosis appears to be chronic and he had apparently seen hematology for this previously, and tachycardia/hypotension attributed to GI bleed  - Culture if febrile  DVT prophylaxis: SCDs  Code Status: Full  Family Communication: Discussed with patient  Disposition Plan:  Patient is from: Home  Anticipated d/c is to: TBD Anticipated d/c date is: 08/07/19 Patient currently: Critically-ill with acute GI bleeding and renal failure  Consults called: GI consulted by ED physician  Admission status: Inpatient     Vianne Bulls, MD [facility]  08/04/2019, 1:14 AM

## 2019-08-04 NOTE — ED Notes (Signed)
Dr.  Rodena Piety notified that pt MAP had been below 65 for several consecutive BP checks at this time.

## 2019-08-04 NOTE — Progress Notes (Signed)
PROGRESS NOTE    Albert [name]  MEQ:[id number] DOB: 01-13-1933 DOA: 08/03/2019 PCP: [name], MD   Brief Narrative:Albert [name] is a 86 y.o. male with medical history significant for hypertension, chronic kidney disease stage IIIa, and chronic pain, now presenting to the emergency department with generalized weakness and GI bleeding.  Patient reports that he began to feel weak in general approximately 1 week ago, progressed to the point where he had difficulty even getting out of bed today.  Yesterday, he developed some vague abdominal discomfort with nausea, dry heaving, and diarrhea.  Patient denied seeing any blood in his stool but EMS reports finding him in a pool of maroon blood.  Patient denies any alcohol use or history of liver disease.  He takes aspirin and Celebrex daily, and does not take a PPI or H2 blocker.  He denies any chest pain, fevers, chills, cough, shortness of breath, or leg swelling or tenderness.  ED Course: Upon arrival to the ED, patient is found to be afebrile, saturating well on room air, mildly tachycardic, and with systolic blood pressure in the 90s.  EKG features sinus tachycardia with rate 110, PVCs, and T wave inversions.  Chest x-ray is negative for acute cardiopulmonary disease.  Chemistry panel concerning for bicarbonate of 16, BUN 121, and creatinine 2.63, up from 1.3 in April.  CBC is notable for a chronic leukocytosis and a new anemia with hemoglobin 6.0, down from 12.1 in April.  INR is elevated to 2.2.  Gastroenterology was consulted by the ED physician and the patient was given a liter of lactated Ringer's, IV vitamin K, IV Protonix, and 1 unit of red blood cells.  Assessment & Plan:   Principal Problem:   Acute GI bleeding Active Problems:   Acute renal failure superimposed on stage 3a chronic kidney disease (HCC)   Benign essential hypertension   Symptomatic anemia   Coagulopathy (HCC)   Metabolic acidosis   SIRS (systemic inflammatory  response syndrome) ([place])     #1 acute GI bleeding appreciate GI input.  Patient received blood transfusion and FFP.  Continue IV PPI.  Plan for EGD plus or minus colonoscopy in a.m.  INR was found to be 2.2 in ED patient does not take any anticoagulation at home.  He was given vitamin K FFP and blood transfusion.  #2 AKI on CKD stage IIIb his baseline creatinine is around 1.3 at the time of admission his creatinine is 2.63.  BUN was very elevated at 125 with a bicarb of 16.  This could very well likely be prerenal in the setting of diarrhea diuretic use and blood loss.  Patient also takes NSAIDs on a regular basis.  #3 essential hypertension blood pressure soft in the ED hold antihypertensives and continue IV fluids.  Estimated body mass index is 20.94 kg/m as calculated from the following:   Height as of this encounter: 5\' 4"  (1.626 m).   Weight as of this encounter: 55.3 kg.  DVT prophylaxis: SCD Code Status: Full code  family Communication: None at bedside Disposition Plan:  Status is: Inpatient  Dispo: The patient is from: Home              Anticipated d/c is to: Home              Anticipated d/c date is: 2 days              Patient currently is not medically stable to d/c.   Patient admitted with  acute GI bleed work-up pending continuing to get blood transfusion.    Consultants:   GI  Procedures: None Antimicrobials: None Subjective:  He is resting in bed very asleep when I saw him early this morning was able to wake him up however he reported very tired and weak. Objective: Vitals:   08/04/19 1057 08/04/19 1200 08/04/19 1213 08/04/19 1330  BP: (!) 108/53 (!) 95/49  99/66  Pulse: 81 74 87 96  Resp: 15 21 19 21   Temp: 97.7 F (36.5 C)     TempSrc: Axillary     SpO2: 100% 100% 99% 99%  Weight:      Height:        Intake/Output Summary (Last 24 hours) at 08/04/2019 1505 Last data filed at 08/04/2019 1216 Gross per 24 hour  Intake 2799 ml  Output --  Net 2799  ml   Filed Weights   08/03/19 2207  Weight: 55.3 kg    Examination:  General exam: Appears ill appearing and pale Respiratory system: Clear to auscultation. Respiratory effort normal. Cardiovascular system: S1 & S2 heard, RRR. No JVD, murmurs, rubs, gallops or clicks. No pedal edema. Gastrointestinal system: Abdomen is nondistended, soft and nontender. No organomegaly or masses felt. Normal bowel sounds heard. Central nervous system: Alert and oriented. No focal neurological deficits. Extremities: Symmetric 5 x 5 power. Skin: No rashes, lesions or ulcers Psychiatry: Judgement and insight appear normal. Mood & affect appropriate.     Data Reviewed: I have personally reviewed following labs and imaging studies  CBC: Recent Labs  Lab 08/03/19 2214 08/04/19 0509 08/04/19 0631  WBC 16.2*  --  13.6*  NEUTROABS 12.9*  --   --   HGB 6.0* 6.8* 5.7*  HCT 18.2* 20.0* 16.3*  MCV 93.8  --  87.2  PLT 211  --  295*   Basic Metabolic Panel: Recent Labs  Lab 08/03/19 2214 08/04/19 0631  NA 139  --   K 5.1  --   CL 107  --   CO2 16*  --   GLUCOSE 170*  --   BUN 121*  --   CREATININE 2.63*  --   CALCIUM 7.6*  --   MG  --  1.2*   GFR: Estimated Creatinine Clearance: 16.4 mL/min (A) (by C-G formula based on SCr of 2.63 mg/dL (H)). Liver Function Tests: Recent Labs  Lab 08/03/19 2214 08/04/19 0631  AST 42* 23  ALT 13 9  ALKPHOS 48 29*  BILITOT 0.9 0.8  PROT 4.7* <3.0*  ALBUMIN 2.4* 1.6*   No results for input(s): LIPASE, AMYLASE in the last 168 hours. No results for input(s): AMMONIA in the last 168 hours. Coagulation Profile: Recent Labs  Lab 08/03/19 2214 08/04/19 0509  INR 2.2* 1.8*   Cardiac Enzymes: Recent Labs  Lab 08/03/19 2218  CKTOTAL 221   BNP (last 3 results) No results for input(s): PROBNP in the last 8760 hours. HbA1C: No results for input(s): HGBA1C in the last 72 hours. CBG: No results for input(s): GLUCAP in the last 168 hours. Lipid  Profile: No results for input(s): CHOL, HDL, LDLCALC, TRIG, CHOLHDL, LDLDIRECT in the last 72 hours. Thyroid Function Tests: No results for input(s): TSH, T4TOTAL, FREET4, T3FREE, THYROIDAB in the last 72 hours. Anemia Panel: No results for input(s): VITAMINB12, FOLATE, FERRITIN, TIBC, IRON, RETICCTPCT in the last 72 hours. Sepsis Labs: No results for input(s): PROCALCITON, LATICACIDVEN in the last 168 hours.  Recent Results (from the past 240 hour(s))  SARS Coronavirus 2  by RT PCR (hospital order, performed in [facility] hospital lab) Nasopharyngeal Nasopharyngeal Swab     Status: None   Collection Time: 08/03/19 10:53 PM   Specimen: Nasopharyngeal Swab  Result Value Ref Range Status   SARS Coronavirus 2 NEGATIVE NEGATIVE Final    Comment: (NOTE) SARS-CoV-2 target nucleic acids are NOT DETECTED.  The SARS-CoV-2 RNA is generally detectable in upper and lower respiratory specimens during the acute phase of infection. The lowest concentration of SARS-CoV-2 viral copies this assay can detect is 250 copies / mL. A negative result does not preclude SARS-CoV-2 infection and should not be used as the sole basis for treatment or other patient management decisions.  A negative result may occur with improper specimen collection / handling, submission of specimen other than nasopharyngeal swab, presence of viral mutation(s) within the areas targeted by this assay, and inadequate number of viral copies (<250 copies / mL). A negative result must be combined with clinical observations, patient history, and epidemiological information.  Fact Sheet for Patients:   StrictlyIdeas.no  Fact Sheet for Healthcare Providers: BankingDealers.co.za  This test is not yet approved or  cleared by the [place] FDA and has been authorized for detection and/or diagnosis of SARS-CoV-2 by FDA under an Emergency Use Authorization (EUA).  This EUA will remain in  effect (meaning this test can be used) for the duration of the COVID-19 declaration under Section 564(b)(1) of the Act, 21 U.S.C. section 360bbb-3(b)(1), unless the authorization is terminated or revoked sooner.  Performed at [facility], [address] [place], [place] [address]          Radiology Studies: US RENAL  Result Date: 08/04/2019 CLINICAL DATA:  Acute on chronic renal failure EXAM: RENAL / URINARY TRACT ULTRASOUND COMPLETE COMPARISON:  None. FINDINGS: Right Kidney: Renal measurements: 8.6 x 4.0 x 3.7 cm = volume: 67 mL. Mildly increased echogenicity seen throughout. A 1.4 cm shadowing calculus seen within the midpole. No hydronephrosis. Left Kidney: Renal measurements: 8.9 x 3.8 x 3.6 cm. = volume: 63 mL. Mildly increased echogenicity seen throughout. No mass or hydronephrosis visualized. Bladder: Appears normal for degree of bladder distention. Other: Mildly enlarged prostate gland is seen. IMPRESSION: Nonobstructing right renal calculus. Diffusely increased parenchymal echogenicity, consistent with medical renal disease. Electronically Signed   By: Prudencio Pair M.D.   On: 08/04/2019 01:33   DG Chest Portable 1 View  Result Date: 08/03/2019 CLINICAL DATA:  Weakness EXAM: PORTABLE CHEST 1 VIEW COMPARISON:  11/22/2018 FINDINGS: No focal opacity or pleural effusion. Probable scarring in the left upper lung. Normal cardiomediastinal silhouette with aortic atherosclerosis. No pneumothorax. IMPRESSION: No active disease. Electronically Signed   By: Donavan Foil M.D.   On: 08/03/2019 23:20        Scheduled Meds: . [START ON 08/07/2019] pantoprazole  40 mg Intravenous Q12H  . polyethylene glycol-electrolytes  4,000 mL Oral Once  . sodium chloride flush  3 mL Intravenous Q12H   Continuous Infusions: . pantoprozole (PROTONIX) infusion 8 mg/hr (08/04/19 1143)  .  sodium bicarbonate (isotonic) infusion in sterile water 100 mL/hr at 08/04/19 1255     LOS: 0 days      Georgette Shell, MD [facility] 08/04/2019, 3:05 PM

## 2019-08-04 NOTE — Consult Note (Signed)
GI Inpatient Consult Note  Reason for Consult: GI Bleed, Symptomatic anemia    Attending Requesting Consult: Dr. Jacki Cones, MD  History of Present Illness: Albert [name] is a 86 y.o. male seen for evaluation of acute GI bleeding at the request of Dr. Zigmund Daniel. Pt has a PMH of HTN, CKD Stage IIIa, chronic pain syndrome who presented to the ED last night with complaints of generalized weakness and hematochezia. He endorsed a 1-week history of progressive weakness which culminated in him having severe difficulty getting out of bed yesterday. He lives alone and is closely watched after by his cousin and her husband. His cousin, Albert [name], found him in the bed and called EMS. EMS found patient in a pool of maroon blood in his bed. Upon presentation to the ED, he was tachycardic with borderline hypotension in low 95A systolic and he was given IVF with improvements in HR and stabilization of BP. Labs were significant for BUN 121 and Cr 2.63, up from 1.3 in April. CBC showed chronic leukocytosis and acute blood loss anemia with hemoglobin 6.0, decreased from 12.1 in April. INR was elevated at 2.2 and he was given Vitamin K, 1 unit pRBCs, and started on Protonix drip. Patient denies any previous history of GI bleeding. He reports that he had been having severe constipation up until Wednesday of this week where it had been 5 days since a BM (normally goes every 2-3 days). He reports Wednesday he started to have some diarrhea up to 3-5 times daily with very loose stool, but he didn't look at the color and is unable to tell if this was bloody or melanotic. He reports yesterday he had some generalized upper abdominal discomfort and nausea without emesis. He does take aspirin and Celebrex daily for chronic pain. He does not take any acid suppression therapy. No known history of liver disease and he does not take any anticoagulation. He reports resolution of his abdominal pain today and no complaints of nausea. He  feels much better after transfusion. He has never had a colonoscopy. No known family history of colon cancer or adenomatous polyps. He reports no bowel movements since being in the ED. He denies any chest pain, shortness of breath, or dizziness.     Past Medical History:  Past Medical History:  Diagnosis Date  . Actinic keratosis 04/23/2008   L elbow distal tricep (bx proven)  . Basal cell carcinoma 06/17/2006   L preauricular   . Basal cell carcinoma 05/26/2012   L ant neck   . Basal cell carcinoma 11/02/2012   Glabella - excision   . Basal cell carcinoma 08/15/2014   L med ankle   . Basal cell carcinoma 02/23/2017   R lat infrapectoral - excision  . Basal cell carcinoma 12/08/2017   L mid to distal lat volar forearm   . Hypertension   . Squamous cell carcinoma of skin 02/14/2014   R lat low back - ED&C  . Squamous cell carcinoma of skin 10/02/2014   R prox lat bicep - ED&C    Problem List: Patient Active Problem List   Diagnosis Date Noted  . Acute GI bleeding 08/04/2019  . Symptomatic anemia 08/04/2019  . Coagulopathy ([place]) 08/04/2019  . Metabolic acidosis 21/30/8657  . SIRS (systemic inflammatory response syndrome) ([place]) 08/04/2019  . Dehydration 11/24/2018  . CKD (chronic kidney disease), stage III 11/24/2018  . Generalized weakness 11/22/2018  . Acute renal failure superimposed on stage 3a chronic kidney disease ([place])   .  Elevated troponin   . Benign essential hypertension 06/15/2013    Past Surgical History: History reviewed. No pertinent surgical history.  Allergies: Allergies  Allergen Reactions  . Penicillins     POA reports penicillin as allergy but unsure of reaction    Home Medications: (Not in a hospital admission)  Home medication reconciliation was completed with the patient.   Scheduled Inpatient Medications:   . [START ON 08/07/2019] pantoprazole  40 mg Intravenous Q12H  . sodium chloride flush  3 mL Intravenous Q12H    Continuous Inpatient  Infusions:   . pantoprozole (PROTONIX) infusion 8 mg/hr (08/04/19 1143)  .  sodium bicarbonate (isotonic) infusion in sterile water 100 mL/hr at 08/04/19 0323    PRN Inpatient Medications:  fentaNYL (SUBLIMAZE) injection, ondansetron **OR** ondansetron (ZOFRAN) IV  Family History: family history is not on file.  The patient's family history is negative for inflammatory bowel disorders, GI malignancy, or solid organ transplantation.  Social History:   reports that he has never smoked. He has never used smokeless tobacco. He reports previous alcohol use. He reports that he does not use drugs. The patient denies ETOH, tobacco, or drug use.   Review of Systems: Constitutional: Weight is stable.  Eyes: No changes in vision. ENT: No oral lesions, sore throat.  GI: see HPI.  Heme/Lymph: No easy bruising.  CV: No chest pain.  GU: No hematuria.  Integumentary: No rashes.  Neuro: No headaches.  Psych: No depression/anxiety.  Endocrine: No heat/cold intolerance.  Allergic/Immunologic: No urticaria.  Resp: No cough, SOB.  Musculoskeletal: No joint swelling.    Physical Examination: BP (!) 95/49   Pulse 87   Temp 97.7 F (36.5 C) (Axillary)   Resp 19   Ht 5\' 4"  (1.626 m)   Wt 55.3 kg   SpO2 99%   BMI 20.94 kg/m   Pleasant, elderly male laying in ED stretcher. Answers all questions appropriately. No accessory muscle use when breathing.  Gen: NAD, alert and oriented x 4 HEENT: PEERLA, EOMI, Neck: supple, no JVD or thyromegaly Chest: CTA bilaterally, no wheezes, crackles, or other adventitious sounds CV: RRR, no m/g/c/r Abd: soft, NT, ND, +BS in all four quadrants; no HSM, guarding, ridigity, or rebound tenderness Ext: no edema, well perfused with 2+ pulses, Skin: no rash or lesions noted Lymph: no LAD  Data: Lab Results  Component Value Date   WBC 13.6 (H) 08/04/2019   HGB 5.7 (L) 08/04/2019   HCT 16.3 (L) 08/04/2019   MCV 87.2 08/04/2019   PLT 123 (L) 08/04/2019    Recent Labs  Lab 08/03/19 2214 08/04/19 0509 08/04/19 0631  HGB 6.0* 6.8* 5.7*   Lab Results  Component Value Date   NA 139 08/03/2019   K 5.1 08/03/2019   CL 107 08/03/2019   CO2 16 (L) 08/03/2019   BUN 121 (H) 08/03/2019   CREATININE 2.63 (H) 08/03/2019   Lab Results  Component Value Date   ALT 9 08/04/2019   AST 23 08/04/2019   ALKPHOS 29 (L) 08/04/2019   BILITOT 0.8 08/04/2019   Recent Labs  Lab 08/03/19 2214 08/03/19 2214 08/04/19 0509  APTT 36  --   --   INR 2.2*   < > 1.8*   < > = values in this interval not displayed.   Assessment/Plan:  86 y/o male with a PMH of chronic pain syndrome, HTN, CKD Stage IIIa admitted to the hospital for symptomatic anemia  1. Acute blood loss anemia - hemoglobin down to 5.7 from  12.1 in April with profoundly elevated BUN consistent with GI bleed. Given longstanding history of daily NSAID use, consider peptic ulcer disease, gastritis, esophagitis, duodenitis, AVMs, small bowel lesions, colon polyps, or malignancy  2. Hematochezia - patient found covered in maroon stool by EMS, no recurrent episodes since being in the ED  3. Acute on chronic kidney disease - BUN 125, Cr 2.63 upon admission  4. Coagulopathy - unknown etiology, INR 2.2 in ED and given Vitamin K and plasma  5. Longstanding history of daily NSAID use  COVID-19 test: NEGATIVE  Recommendations:  -Continue to monitor H&H very closely and transfuse as needed for Hgb <7.0. -Agree with Protonix drip -No evidence of active hemorrhage at present time -Avoid all NSAIDs -Follow-up on INR - ideally would like INR at 1.5 or below -Advise EGD and colonoscopy for luminal evaluation for diagnostic and potentially therapeutic indications. He is colonoscopy naive. We discussed procedure details and indications in room today. He consents to proceed. -Clear liquid diet. NPO after midnight. -Plan for EGD and colonoscopy tomorrow with Dr. Alice Reichert -Further recommendations after  procedures  If evidence of active bleeding, please call Dr. Alice Reichert.  I reviewed the risks (including bleeding, perforation, infection, anesthesia complications, cardiac/respiratory complications), benefits and alternatives of EGD. Patient consents to proceed.   I called patient's HPOA - [name] - who was in complete agreement with above plan of care.   Thank you for the consult. Please call with questions or concerns.  Reeves Forth [facility] [phone] (Cell)

## 2019-08-04 NOTE — H&P (View-Only) (Signed)
GI Inpatient Consult Note  Reason for Consult: GI Bleed, Symptomatic anemia    Attending Requesting Consult: Dr. Jacki Cones, MD  History of Present Illness: Albert [name] is a 86 y.o. male seen for evaluation of acute GI bleeding at the request of Dr. Zigmund Daniel. Pt has a PMH of HTN, CKD Stage IIIa, chronic pain syndrome who presented to the ED last night with complaints of generalized weakness and hematochezia. He endorsed a 1-week history of progressive weakness which culminated in him having severe difficulty getting out of bed yesterday. He lives alone and is closely watched after by his cousin and her husband. His cousin, Carlyon Shadow, found him in the bed and called EMS. EMS found patient in a pool of maroon blood in his bed. Upon presentation to the ED, he was tachycardic with borderline hypotension in low 31D systolic and he was given IVF with improvements in HR and stabilization of BP. Labs were significant for BUN 121 and Cr 2.63, up from 1.3 in April. CBC showed chronic leukocytosis and acute blood loss anemia with hemoglobin 6.0, decreased from 12.1 in April. INR was elevated at 2.2 and he was given Vitamin K, 1 unit pRBCs, and started on Protonix drip. Patient denies any previous history of GI bleeding. He reports that he had been having severe constipation up until Wednesday of this week where it had been 5 days since a BM (normally goes every 2-3 days). He reports Wednesday he started to have some diarrhea up to 3-5 times daily with very loose stool, but he didn't look at the color and is unable to tell if this was bloody or melanotic. He reports yesterday he had some generalized upper abdominal discomfort and nausea without emesis. He does take aspirin and Celebrex daily for chronic pain. He does not take any acid suppression therapy. No known history of liver disease and he does not take any anticoagulation. He reports resolution of his abdominal pain today and no complaints of nausea. He  feels much better after transfusion. He has never had a colonoscopy. No known family history of colon cancer or adenomatous polyps. He reports no bowel movements since being in the ED. He denies any chest pain, shortness of breath, or dizziness.     Past Medical History:  Past Medical History:  Diagnosis Date  . Actinic keratosis 04/23/2008   L elbow distal tricep (bx proven)  . Basal cell carcinoma 06/17/2006   L preauricular   . Basal cell carcinoma 05/26/2012   L ant neck   . Basal cell carcinoma 11/02/2012   Glabella - excision   . Basal cell carcinoma 08/15/2014   L med ankle   . Basal cell carcinoma 02/23/2017   R lat infrapectoral - excision  . Basal cell carcinoma 12/08/2017   L mid to distal lat volar forearm   . Hypertension   . Squamous cell carcinoma of skin 02/14/2014   R lat low back - ED&C  . Squamous cell carcinoma of skin 10/02/2014   R prox lat bicep - ED&C    Problem List: Patient Active Problem List   Diagnosis Date Noted  . Acute GI bleeding 08/04/2019  . Symptomatic anemia 08/04/2019  . Coagulopathy ([place]) 08/04/2019  . Metabolic acidosis 17/61/6073  . SIRS (systemic inflammatory response syndrome) ([place]) 08/04/2019  . Dehydration 11/24/2018  . CKD (chronic kidney disease), stage III 11/24/2018  . Generalized weakness 11/22/2018  . Acute renal failure superimposed on stage 3a chronic kidney disease ([place])   .  Elevated troponin   . Benign essential hypertension 06/15/2013    Past Surgical History: History reviewed. No pertinent surgical history.  Allergies: Allergies  Allergen Reactions  . Penicillins     POA reports penicillin as allergy but unsure of reaction    Home Medications: (Not in a hospital admission)  Home medication reconciliation was completed with the patient.   Scheduled Inpatient Medications:   . [START ON 08/07/2019] pantoprazole  40 mg Intravenous Q12H  . sodium chloride flush  3 mL Intravenous Q12H    Continuous Inpatient  Infusions:   . pantoprozole (PROTONIX) infusion 8 mg/hr (08/04/19 1143)  .  sodium bicarbonate (isotonic) infusion in sterile water 100 mL/hr at 08/04/19 0323    PRN Inpatient Medications:  fentaNYL (SUBLIMAZE) injection, ondansetron **OR** ondansetron (ZOFRAN) IV  Family History: family history is not on file.  The patient's family history is negative for inflammatory bowel disorders, GI malignancy, or solid organ transplantation.  Social History:   reports that he has never smoked. He has never used smokeless tobacco. He reports previous alcohol use. He reports that he does not use drugs. The patient denies ETOH, tobacco, or drug use.   Review of Systems: Constitutional: Weight is stable.  Eyes: No changes in vision. ENT: No oral lesions, sore throat.  GI: see HPI.  Heme/Lymph: No easy bruising.  CV: No chest pain.  GU: No hematuria.  Integumentary: No rashes.  Neuro: No headaches.  Psych: No depression/anxiety.  Endocrine: No heat/cold intolerance.  Allergic/Immunologic: No urticaria.  Resp: No cough, SOB.  Musculoskeletal: No joint swelling.    Physical Examination: BP (!) 95/49   Pulse 87   Temp 97.7 F (36.5 C) (Axillary)   Resp 19   Ht 5\' 4"  (1.626 m)   Wt 55.3 kg   SpO2 99%   BMI 20.94 kg/m   Pleasant, elderly male laying in ED stretcher. Answers all questions appropriately. No accessory muscle use when breathing.  Gen: NAD, alert and oriented x 4 HEENT: PEERLA, EOMI, Neck: supple, no JVD or thyromegaly Chest: CTA bilaterally, no wheezes, crackles, or other adventitious sounds CV: RRR, no m/g/c/r Abd: soft, NT, ND, +BS in all four quadrants; no HSM, guarding, ridigity, or rebound tenderness Ext: no edema, well perfused with 2+ pulses, Skin: no rash or lesions noted Lymph: no LAD  Data: Lab Results  Component Value Date   WBC 13.6 (H) 08/04/2019   HGB 5.7 (L) 08/04/2019   HCT 16.3 (L) 08/04/2019   MCV 87.2 08/04/2019   PLT 123 (L) 08/04/2019    Recent Labs  Lab 08/03/19 2214 08/04/19 0509 08/04/19 0631  HGB 6.0* 6.8* 5.7*   Lab Results  Component Value Date   NA 139 08/03/2019   K 5.1 08/03/2019   CL 107 08/03/2019   CO2 16 (L) 08/03/2019   BUN 121 (H) 08/03/2019   CREATININE 2.63 (H) 08/03/2019   Lab Results  Component Value Date   ALT 9 08/04/2019   AST 23 08/04/2019   ALKPHOS 29 (L) 08/04/2019   BILITOT 0.8 08/04/2019   Recent Labs  Lab 08/03/19 2214 08/03/19 2214 08/04/19 0509  APTT 36  --   --   INR 2.2*   < > 1.8*   < > = values in this interval not displayed.   Assessment/Plan:  86 y/o male with a PMH of chronic pain syndrome, HTN, CKD Stage IIIa admitted to the hospital for symptomatic anemia  1. Acute blood loss anemia - hemoglobin down to 5.7 from  12.1 in April with profoundly elevated BUN consistent with GI bleed. Given longstanding history of daily NSAID use, consider peptic ulcer disease, gastritis, esophagitis, duodenitis, AVMs, small bowel lesions, colon polyps, or malignancy  2. Hematochezia - patient found covered in maroon stool by EMS, no recurrent episodes since being in the ED  3. Acute on chronic kidney disease - BUN 125, Cr 2.63 upon admission  4. Coagulopathy - unknown etiology, INR 2.2 in ED and given Vitamin K and plasma  5. Longstanding history of daily NSAID use  COVID-19 test: NEGATIVE  Recommendations:  -Continue to monitor H&H very closely and transfuse as needed for Hgb <7.0. -Agree with Protonix drip -No evidence of active hemorrhage at present time -Avoid all NSAIDs -Follow-up on INR - ideally would like INR at 1.5 or below -Advise EGD and colonoscopy for luminal evaluation for diagnostic and potentially therapeutic indications. He is colonoscopy naive. We discussed procedure details and indications in room today. He consents to proceed. -Clear liquid diet. NPO after midnight. -Plan for EGD and colonoscopy tomorrow with Dr. Alice Reichert -Further recommendations after  procedures  If evidence of active bleeding, please call Dr. Alice Reichert.  I reviewed the risks (including bleeding, perforation, infection, anesthesia complications, cardiac/respiratory complications), benefits and alternatives of EGD. Patient consents to proceed.   I called patient's HPOA - [name] - who was in complete agreement with above plan of care.   Thank you for the consult. Please call with questions or concerns.  Reeves Forth [facility] [phone] (Cell)

## 2019-08-04 NOTE — ED Notes (Signed)
Blood bank contacted at this time to prepare plasma, will contact RN when ready to admin to pt

## 2019-08-05 ENCOUNTER — Encounter
Admission: EM | Disposition: A | Discharge status: Home Health | Payer: Self-pay | Source: Home / Self Care | Attending: Internal Medicine

## 2019-08-05 ENCOUNTER — Inpatient Hospital Stay: Payer: Medicare Other | Admitting: Anesthesiology

## 2019-08-05 ENCOUNTER — Encounter: Payer: Self-pay | Admitting: Internal Medicine

## 2019-08-05 HISTORY — PX: COLONOSCOPY: SHX5424

## 2019-08-05 HISTORY — PX: ESOPHAGOGASTRODUODENOSCOPY: SHX5428

## 2019-08-05 LAB — PROTIME-INR
INR: 1.3 — ABNORMAL HIGH (ref 0.8–1.2)
Prothrombin Time: 16.1 seconds — ABNORMAL HIGH (ref 11.4–15.2)

## 2019-08-05 LAB — BPAM FFP
Blood Product Expiration Date: 202108012359
Blood Product Expiration Date: 202108042359
ISSUE DATE / TIME: 202107300619
ISSUE DATE / TIME: 202107301034
Unit Type and Rh: 5100
Unit Type and Rh: 5100

## 2019-08-05 LAB — PREPARE FRESH FROZEN PLASMA: Unit division: 0

## 2019-08-05 LAB — CBC
HCT: 19.8 % — ABNORMAL LOW (ref 39.0–52.0)
HCT: 23.1 % — ABNORMAL LOW (ref 39.0–52.0)
Hemoglobin: 6.5 g/dL — ABNORMAL LOW (ref 13.0–17.0)
Hemoglobin: 7.5 g/dL — ABNORMAL LOW (ref 13.0–17.0)
MCH: 29.5 pg (ref 26.0–34.0)
MCH: 30 pg (ref 26.0–34.0)
MCHC: 32.8 g/dL (ref 30.0–36.0)
MCHC: 32.5 g/dL (ref 30.0–36.0)
MCV: 90 fL (ref 80.0–100.0)
MCV: 92.4 fL (ref 80.0–100.0)
Platelets: 116 10*3/uL — ABNORMAL LOW (ref 150–400)
Platelets: 101 10*3/uL — ABNORMAL LOW (ref 150–400)
RBC: 2.2 MIL/uL — ABNORMAL LOW (ref 4.22–5.81)
RBC: 2.5 MIL/uL — ABNORMAL LOW (ref 4.22–5.81)
RDW: 15.6 % — ABNORMAL HIGH (ref 11.5–15.5)
RDW: 16.6 % — ABNORMAL HIGH (ref 11.5–15.5)
WBC: 6.3 10*3/uL (ref 4.0–10.5)
WBC: 6.1 10*3/uL (ref 4.0–10.5)
nRBC: 2.2 % — ABNORMAL HIGH (ref 0.0–0.2)
nRBC: 1.8 % — ABNORMAL HIGH (ref 0.0–0.2)

## 2019-08-05 LAB — GLUCOSE, CAPILLARY: Glucose-Capillary: 80 mg/dL (ref 70–99)

## 2019-08-05 LAB — PREPARE RBC (CROSSMATCH)

## 2019-08-05 LAB — BASIC METABOLIC PANEL
Anion gap: 13 (ref 5–15)
BUN: 108 mg/dL — ABNORMAL HIGH (ref 8–23)
CO2: 24 mmol/L (ref 22–32)
Calcium: 7.2 mg/dL — ABNORMAL LOW (ref 8.9–10.3)
Chloride: 100 mmol/L (ref 98–111)
Creatinine, Ser: 2.58 mg/dL — ABNORMAL HIGH (ref 0.61–1.24)
GFR calc Af Amer: 25 mL/min — ABNORMAL LOW (ref >60)
GFR calc non Af Amer: 22 mL/min — ABNORMAL LOW (ref >60)
Glucose, Bld: 93 mg/dL (ref 70–99)
Potassium: 3.7 mmol/L (ref 3.5–5.1)
Sodium: 137 mmol/L (ref 135–145)

## 2019-08-05 LAB — HEMOGLOBIN AND HEMATOCRIT, BLOOD
HCT: 21.8 % — ABNORMAL LOW (ref 39.0–52.0)
Hemoglobin: 7.5 g/dL — ABNORMAL LOW (ref 13.0–17.0)

## 2019-08-05 SURGERY — EGD (ESOPHAGOGASTRODUODENOSCOPY)
Anesthesia: General

## 2019-08-05 MED ORDER — LACTATED RINGERS IV SOLN: INTRAVENOUS | Status: DC | PRN

## 2019-08-05 MED ORDER — PHENYLEPHRINE HCL (PRESSORS) 10 MG/ML IV SOLN
INTRAVENOUS | Status: DC | PRN
  Administered 2019-08-05: 200 ug via INTRAVENOUS
  Administered 2019-08-05: 40 ug via INTRAVENOUS
  Administered 2019-08-05: 50 ug via INTRAVENOUS
  Administered 2019-08-05: 100 ug via INTRAVENOUS

## 2019-08-05 MED ORDER — SODIUM CHLORIDE 0.9% IV SOLUTION: Freq: Once | INTRAVENOUS | Status: DC

## 2019-08-05 MED ORDER — ADULT MULTIVITAMIN W/MINERALS CH
1.0000 | ORAL_TABLET | Freq: Every day | ORAL | Status: DC
  Administered 2019-08-05 – 2019-08-08 (×3): 1 via ORAL
  Filled 2019-08-05 (×2): qty 1

## 2019-08-05 MED ORDER — SODIUM CHLORIDE 0.9 % IV SOLN: INTRAVENOUS | Status: DC

## 2019-08-05 MED ORDER — PROPOFOL 10 MG/ML IV BOLUS
INTRAVENOUS | Status: AC
  Filled 2019-08-05: qty 20

## 2019-08-05 MED ORDER — LIDOCAINE HCL (PF) 2 % IJ SOLN
INTRAMUSCULAR | Status: AC
  Filled 2019-08-05: qty 5

## 2019-08-05 MED ORDER — EPHEDRINE SULFATE 50 MG/ML IJ SOLN
INTRAMUSCULAR | Status: DC | PRN
  Administered 2019-08-05: 5 mg via INTRAVENOUS
  Administered 2019-08-05 (×5): 10 mg via INTRAVENOUS

## 2019-08-05 MED ORDER — BOOST / RESOURCE BREEZE PO LIQD CUSTOM
1.0000 | Freq: Three times a day (TID) | ORAL | Status: DC
  Administered 2019-08-05 – 2019-08-07 (×2): 1 via ORAL

## 2019-08-05 MED ORDER — LIDOCAINE HCL (CARDIAC) PF 100 MG/5ML IV SOSY
PREFILLED_SYRINGE | INTRAVENOUS | Status: DC | PRN
  Administered 2019-08-05: 100 mg via INTRAVENOUS

## 2019-08-05 MED ORDER — PHENYLEPHRINE HCL (PRESSORS) 10 MG/ML IV SOLN
INTRAVENOUS | Status: AC
  Filled 2019-08-05: qty 1

## 2019-08-05 MED ORDER — SODIUM CHLORIDE 0.9 % IV SOLN
INTRAVENOUS | Status: DC | PRN
  Administered 2019-08-05: 50 ug/min via INTRAVENOUS

## 2019-08-05 MED ORDER — EPHEDRINE 5 MG/ML INJ
INTRAVENOUS | Status: AC
  Filled 2019-08-05: qty 10

## 2019-08-05 MED ORDER — PROPOFOL 500 MG/50ML IV EMUL
INTRAVENOUS | Status: DC | PRN
  Administered 2019-08-05: 20 mg via INTRAVENOUS
  Administered 2019-08-05: 150 ug/kg/min via INTRAVENOUS

## 2019-08-05 NOTE — Transfer of Care (Signed)
Immediate Anesthesia Transfer of Care Note  Patient: Albert [name]  Procedure(s) Performed: ESOPHAGOGASTRODUODENOSCOPY (EGD) (N/A ) COLONOSCOPY (N/A )  Patient Location: Endoscopy Unit  Anesthesia Type:MAC  Level of Consciousness: sedated  Airway & Oxygen Therapy: Patient Spontanous Breathing and Patient connected to nasal cannula oxygen  Post-op Assessment: Report given to RN and Post -op Vital signs reviewed and stable  Post vital signs: Reviewed and stable  Last Vitals:  Vitals Value Taken Time  BP 108/49   Temp    Pulse 79   Resp 16   SpO2 100     Last Pain:  Vitals:   08/05/19 0815  TempSrc: Oral  PainSc:          Complications: No complications documented.

## 2019-08-05 NOTE — Brief Op Note (Signed)
Lab at bedside for h&h draw.

## 2019-08-05 NOTE — Anesthesia Postprocedure Evaluation (Addendum)
Anesthesia Post Note  Patient: Albert [name]  Procedure(s) Performed: ESOPHAGOGASTRODUODENOSCOPY (EGD) (N/A ) COLONOSCOPY (N/A )  Patient location during evaluation: PACU Anesthesia Type: General Level of consciousness: awake and alert Pain management: pain level controlled Vital Signs Assessment: post-procedure vital signs reviewed and stable Respiratory status: spontaneous breathing, nonlabored ventilation, respiratory function stable and patient connected to nasal cannula oxygen Cardiovascular status: blood pressure returned to baseline Postop Assessment: no apparent nausea or vomiting Anesthetic complications: no Comments: Hypotensive in recovery, given phenylephrine and ephedrine boluses as well as IVF bolus, with return of BP to his pre-procedure baseline of 90s/50s with occasional dip to SBP upper 80s.  H/H sent due to soft BPs, found Hgb to be 7.5.     No complications documented.               Tera Mater

## 2019-08-05 NOTE — Interval H&P Note (Signed)
History and Physical Interval Note:  08/05/2019 8:53 AM  Albert [name]  has presented today for surgery, with the diagnosis of Acute blood loss anemia, longstanding use of NSAIDs.  The various methods of treatment have been discussed with the patient and family. After consideration of risks, benefits and other options for treatment, the patient has consented to  Procedure(s): ESOPHAGOGASTRODUODENOSCOPY (EGD) (N/A) COLONOSCOPY (N/A) as a surgical intervention.  The patient's history has been reviewed, patient examined, no change in status, stable for surgery.  I have reviewed the patient's chart and labs.  Questions were answered to the patient's satisfaction.     [place], [place]

## 2019-08-05 NOTE — Op Note (Signed)
[facility] Gastroenterology Patient Name: Albert [name] Procedure Date: 08/05/2019 8:47 AM MRN: [id number] Account #: [id number] Date of Birth: 10/27/1933 Admit Type: Inpatient Age: 86 Room: [facility] ENDO ROOM 4 Gender: Male Note Status: Finalized Procedure:             Colonoscopy Indications:           Hematochezia, Acute post hemorrhagic anemia Providers:             Benay Pike. Alice Reichert MD, MD Referring MD:          Youlanda Roys. Lovie Macadamia, MD (Referring MD) Medicines:             Propofol per Anesthesia Complications:         No immediate complications. Procedure:             Pre-Anesthesia Assessment:                        - The risks and benefits of the procedure and the                         sedation options and risks were discussed with the                         patient. All questions were answered and informed                         consent was obtained.                        - Patient identification and proposed procedure were                         verified prior to the procedure by the nurse. The                         procedure was verified in the procedure room.                        - ASA Grade Assessment: III - A patient with severe                         systemic disease.                        - After reviewing the risks and benefits, the patient                         was deemed in satisfactory condition to undergo the                         procedure.                        After obtaining informed consent, the colonoscope was                         passed under direct vision. Throughout the procedure,                         the patient's blood pressure,  pulse, and oxygen                         saturations were monitored continuously. The                         Colonoscope was introduced through the anus and                         advanced to the the sigmoid colon. The colonoscopy was                         technically difficult and  complex due to inadequate                         bowel prep. The patient tolerated the procedure well.                         The quality of the bowel preparation was poor. Findings:      The perianal and digital rectal examinations were normal. Pertinent       negatives include normal sphincter tone and no palpable rectal lesions.      Many small-mouthed diverticula were found in the sigmoid colon.      Hematin (altered blood/coffee-ground-like material) was found in the       rectum and in the sigmoid colon.      Copious quantities of semi-liquid stool was found in the rectum and in       the sigmoid colon, precluding visualization. Lavage of the area was       performed using a large amount of sterile water, resulting in incomplete       clearance with continued poor visualization.      The exam was otherwise without abnormality on direct and retroflexion       views. Impression:            - Preparation of the colon was poor.                        - Diverticulosis in the sigmoid colon.                        - Blood in the rectum and in the sigmoid colon.                        - Stool in the rectum and in the sigmoid colon.                        - The examination was otherwise normal on direct and                         retroflexion views.                        - No specimens collected. Recommendation:        - Return patient to hospital ward for ongoing care.                        - Clear liquid diet.                        -  Continue present medications.                        - Check hemogram with white blood cell count and                         platelets at appointment to be scheduled.                        - Do a GI bleeding (tagged RBC) scan if symptoms                         persist. Procedure Code(s):     --- Professional ---                        [phone], 53, Colonoscopy, flexible; diagnostic,                         including collection of specimen(s) by  brushing or                         washing, when performed (separate procedure) Diagnosis Code(s):     --- Professional ---                        K57.30, Diverticulosis of large intestine without                         perforation or abscess without bleeding                        D62, Acute posthemorrhagic anemia                        K92.1, Melena (includes Hematochezia)                        K92.2, Gastrointestinal hemorrhage, unspecified                        K62.5, Hemorrhage of anus and rectum CPT copyright 2019 American Medical Association. All rights reserved. The codes documented in this report are preliminary and upon coder review may  be revised to meet current compliance requirements. Efrain Sella MD, MD 08/05/2019 9:56:17 AM This report has been signed electronically. Number of Addenda: 0 Note Initiated On: 08/05/2019 8:47 AM Total Procedure Duration: 0 hours 15 minutes 24 seconds  Estimated Blood Loss:  Estimated blood loss: none.      [facility]

## 2019-08-05 NOTE — Addendum Note (Signed)
Addendum  created 08/05/19 1422 by Tera Mater, MD   Clinical Note Signed

## 2019-08-05 NOTE — Progress Notes (Signed)
Initial Nutrition Assessment  DOCUMENTATION CODES:   Not applicable  INTERVENTION:  Boost Breeze po TID, each supplement provides 250 kcal and 9 grams of protein  MVI with minerals daily   NUTRITION DIAGNOSIS:   Increased nutrient needs related to acute illness (GIB) as evidenced by estimated needs.   GOAL:   Patient will meet greater than or equal to 90% of their needs    MONITOR:   Labs, Diet advancement, Supplement acceptance, PO intake, Weight trends, I & O's  REASON FOR ASSESSMENT:   Malnutrition Screening Tool    ASSESSMENT:  RD working remotely.  86 year old male with past medical history significant for HTN, CKD stage III, and HTN presented with 1 week history of generalized weakness and reports abdominal discomfort with nausea, dry heaving, and diarrhea admitted for acute GIB  Patient is s/p EGD and colonoscopy this morning, noted esophageal ulcer with reflux esophagitis, nonbleeding duodenal ulcers, diverticulosis and blood in rectum and sigmoid colon with stool.   Diet advanced to clear liquids, will order Boost Breeze to aid with meeting needs.   Current wt 132 lbs, limited wt history available, on 11/23/18 pt weighed 60.4 kg (132.88 lb)  I/Os: +2711.9 ml since admit UOP: 350 ml x 24 hrs Medications reviewed and include: IV Protonix IVF: NaCl @ 100 ml/hr Labs: CBG 80, BUN 108 (H), Cr 2.58 (H), Hgb 7.5 (L) trending up s/p 2 units PRBC   NUTRITION - FOCUSED PHYSICAL EXAM: Unable to complete at this time, RD working remotely.  Diet Order:   Diet Order            Diet clear liquid Room service appropriate? Yes; Fluid consistency: Thin  Diet effective now                 EDUCATION NEEDS:   No education needs have been identified at this time  Skin:  Skin Assessment: Reviewed RN Assessment  Last BM:  7/31-type 5  Height:   Ht Readings from Last 1 Encounters:  08/03/19 5\' 4"  (1.626 m)    Weight:   Wt Readings from Last 1 Encounters:   08/05/19 60 kg    Ideal Body Weight:  59.1 kg  BMI:  Body mass index is 22.71 kg/m.  Estimated Nutritional Needs:   Kcal:  1500-1700  Protein:  70-80  Fluid:  >/= 1.5 L   Lajuan Lines, RD, LDN Clinical Nutrition After Hours/Weekend Pager # in [place]

## 2019-08-05 NOTE — Op Note (Signed)
[facility] Gastroenterology Patient Name: Albert [name] Procedure Date: 08/05/2019 8:48 AM MRN: [id number] Account #: [id number] Date of Birth: [birth year] Admit Type: Inpatient Age: 86 Room: [facility] ENDO ROOM 4 Gender: Male Note Status: Finalized Procedure:             Upper GI endoscopy Indications:           Acute post hemorrhagic anemia, Hematochezia Providers:             Benay Pike. Alice Reichert MD, MD Referring MD:          Youlanda Roys. Lovie Macadamia, MD (Referring MD) Medicines:             Propofol per Anesthesia Complications:         No immediate complications. Procedure:             Pre-Anesthesia Assessment:                        - The risks and benefits of the procedure and the                         sedation options and risks were discussed with the                         patient. All questions were answered and informed                         consent was obtained.                        - Patient identification and proposed procedure were                         verified prior to the procedure by the nurse. The                         procedure was verified in the procedure room.                        - ASA Grade Assessment: III - A patient with severe                         systemic disease.                        - After reviewing the risks and benefits, the patient                         was deemed in satisfactory condition to undergo the                         procedure.                        After obtaining informed consent, the endoscope was                         passed under direct vision. Throughout the procedure,                         the patient's  blood pressure, pulse, and oxygen                         saturations were monitored continuously. The Endoscope                         was introduced through the mouth, and advanced to the                         third part of duodenum. The upper GI endoscopy was                          accomplished without difficulty. The patient tolerated                         the procedure well. Findings:      One cratered esophageal ulcer with no stigmata of recent bleeding was       found in the distal esophagus. The lesion was 6 mm in largest dimension.      LA Grade A (one or more mucosal breaks less than 5 mm, not extending       between tops of 2 mucosal folds) esophagitis with no bleeding was found       in the distal esophagus.      A 2 cm hiatal hernia was present.      Diffuse mildly erythematous mucosa without bleeding was found in the       gastric antrum. Biopsies were taken with a cold forceps for Helicobacter       pylori testing.      Two non-bleeding cratered duodenal ulcers with no stigmata of bleeding       were found in the duodenal bulb. The largest lesion was 10 mm in largest       dimension.      The second portion of the duodenum and third portion of the duodenum       were normal.      The exam was otherwise without abnormality. Impression:            - Esophageal ulcer with no stigmata of recent bleeding.                        - LA Grade A reflux esophagitis with no bleeding.                        - 2 cm hiatal hernia.                        - Erythematous mucosa in the antrum. Biopsied.                        - Non-bleeding duodenal ulcers with no stigmata of                         bleeding.                        - Normal second portion of the duodenum and third                         portion of the duodenum.                        -  The examination was otherwise normal. Recommendation:        - Await pathology results.                        - Continue present medications.                        - Proceed with colonoscopy Procedure Code(s):     --- Professional ---                        [phone], Esophagogastroduodenoscopy, flexible,                         transoral; with biopsy, single or multiple Diagnosis Code(s):     --- Professional ---                         K92.1, Melena (includes Hematochezia)                        D62, Acute posthemorrhagic anemia                        K26.9, Duodenal ulcer, unspecified as acute or                         chronic, without hemorrhage or perforation                        K31.89, Other diseases of stomach and duodenum                        K44.9, Diaphragmatic hernia without obstruction or                         gangrene                        K21.00, Gastro-esophageal reflux disease with                         esophagitis, without bleeding                        K22.10, Ulcer of esophagus without bleeding CPT copyright 2019 American Medical Association. All rights reserved. The codes documented in this report are preliminary and upon coder review may  be revised to meet current compliance requirements. Efrain Sella MD, MD 08/05/2019 9:33:12 AM This report has been signed electronically. Number of Addenda: 0 Note Initiated On: 08/05/2019 8:48 AM Estimated Blood Loss:  Estimated blood loss: none.      [facility]

## 2019-08-05 NOTE — Progress Notes (Signed)
PROGRESS NOTE    Albert [name]  EEF:[id number] DOB: 1933-01-30 DOA: 08/03/2019 PCP: [name], MD   Brief Narrative:Albert [name] is a 86 y.o. male with medical history significant for hypertension, chronic kidney disease stage IIIa, and chronic pain, now presenting to the emergency department with generalized weakness and GI bleeding.  Patient reports that he began to feel weak in general approximately 1 week ago, progressed to the point where he had difficulty even getting out of bed today.  Yesterday, he developed some vague abdominal discomfort with nausea, dry heaving, and diarrhea.  Patient denied seeing any blood in his stool but EMS reports finding him in a pool of maroon blood.  Patient denies any alcohol use or history of liver disease.  He takes aspirin and Celebrex daily, and does not take a PPI or H2 blocker.  He denies any chest pain, fevers, chills, cough, shortness of breath, or leg swelling or tenderness.  ED Course: Upon arrival to the ED, patient is found to be afebrile, saturating well on room air, mildly tachycardic, and with systolic blood pressure in the 90s.  EKG features sinus tachycardia with rate 110, PVCs, and T wave inversions.  Chest x-ray is negative for acute cardiopulmonary disease.  Chemistry panel concerning for bicarbonate of 16, BUN 121, and creatinine 2.63, up from 1.3 in April.  CBC is notable for a chronic leukocytosis and a new anemia with hemoglobin 6.0, down from 12.1 in April.  INR is elevated to 2.2.  Gastroenterology was consulted by the ED physician and the patient was given a liter of lactated Ringer's, IV vitamin K, IV Protonix, and 1 unit of red blood cells.  Assessment & Plan:   Principal Problem:   Acute GI bleeding Active Problems:   Acute renal failure superimposed on stage 3a chronic kidney disease (HCC)   Benign essential hypertension   Symptomatic anemia   Coagulopathy (HCC)   Metabolic acidosis   SIRS (systemic inflammatory  response syndrome) (HCC)   UGI bleed     #1 acute GI bleeding-status post EGD 08/05/2019-esophageal ulcer with no stigmata of recent bleeding with reflux esophagitis, 2 cm hiatal hernia and erythematous mucosa in the antrum biopsied.  Nonbleeding duodenal ulcers. Colonoscopy 08/05/2019-poor colonic preparation.  Diverticulosis and blood in the rectum and sigmoid colon with stool.  No biopsy done. We will continue clear liquid diet and PPI. If patient goes into active bleeding will contact RBC scan. Hemoglobin 6.5 this morning ordered another unit of blood transfusion which is done hemoglobin came up to 7.5.  #2 AKI on CKD stage IIIb his baseline creatinine is around 1.3 at the time of admission his creatinine is 2.63.  BUN elevated at 108 consistent with GI bleed. This could very well likely be prerenal in the setting of diarrhea diuretic use and blood loss.  Patient also takes NSAIDs on a regular basis.  #3 essential hypertension-blood pressure persistently low continue to hold antihypertensives.  Restart normal saline cc an hour.    Estimated body mass index is 22.71 kg/m as calculated from the following:   Height as of this encounter: 5\' 4"  (1.626 m).   Weight as of this encounter: 60 kg.  DVT prophylaxis: SCD Code Status: Full code  family Communication: None at bedside Disposition Plan:  Status is: Inpatient  Dispo: The patient is from: Home              Anticipated d/c is to: Home  Anticipated d/c date is: 2 days              Patient currently is not medically stable to d/c.   Patient admitted with acute GI bleed work-up pending continuing to get blood transfusion.    Consultants:   GI  Procedures: None Antimicrobials: None Subjective:  He is resting in bed very asleep when I saw him early this morning was able to wake him up however he reported very tired and weak. Objective: Vitals:   08/05/19 1122 08/05/19 1123 08/05/19 1128 08/05/19 1150  BP: (!)  88/46 (!) 89/46 (!) 87/47 (!) 78/44  Pulse: 75 82 75 81  Resp:    18  Temp:    98.4 F (36.9 C)  TempSrc:      SpO2: 98% 98% 99% 100%  Weight:      Height:        Intake/Output Summary (Last 24 hours) at 08/05/2019 1209 Last data filed at 08/05/2019 1002 Gross per 24 hour  Intake 1060.89 ml  Output 350 ml  Net 710.89 ml   Filed Weights   08/03/19 2207 08/05/19 0545  Weight: 55.3 kg 60 kg    Examination:  General exam: Appears ill appearing and pale Respiratory system: Clear to auscultation. Respiratory effort normal. Cardiovascular system: S1 & S2 heard, RRR. No JVD, murmurs, rubs, gallops or clicks. No pedal edema. Gastrointestinal system: Abdomen is nondistended, soft and nontender. No organomegaly or masses felt. Normal bowel sounds heard. Central nervous system: Alert and oriented. No focal neurological deficits. Extremities: Symmetric 5 x 5 power. Skin: No rashes, lesions or ulcers Psychiatry: Judgement and insight appear normal. Mood & affect appropriate.     Data Reviewed: I have personally reviewed following labs and imaging studies  CBC: Recent Labs  Lab 08/03/19 2214 08/03/19 2214 08/04/19 0509 08/04/19 0631 08/04/19 1640 08/05/19 0407 08/05/19 1121  WBC 16.2*  --   --  13.6* 10.5 6.3  --   NEUTROABS 12.9*  --   --   --   --   --   --   HGB 6.0*   < > 6.8* 5.7* 7.1* 6.5* 7.5*  HCT 18.2*   < > 20.0* 16.3* 19.7* 19.8* 21.8*  MCV 93.8  --   --  87.2 86.4 90.0  --   PLT 211  --   --  123* 122* 116*  --    < > = values in this interval not displayed.   Basic Metabolic Panel: Recent Labs  Lab 08/03/19 2214 08/04/19 0631 08/04/19 1640 08/05/19 0407  NA 139  --  141 137  K 5.1  --  4.2 3.7  CL 107  --  101 100  CO2 16*  --  24 24  GLUCOSE 170*  --  106* 93  BUN 121*  --  120* 108*  CREATININE 2.63*  --  2.77* 2.58*  CALCIUM 7.6*  --  7.3* 7.2*  MG  --  1.2*  --   --    GFR: Estimated Creatinine Clearance: 17.8 mL/min (A) (by C-G formula based  on SCr of 2.58 mg/dL (H)). Liver Function Tests: Recent Labs  Lab 08/03/19 2214 08/04/19 0631  AST 42* 23  ALT 13 9  ALKPHOS 48 29*  BILITOT 0.9 0.8  PROT 4.7* <3.0*  ALBUMIN 2.4* 1.6*   No results for input(s): LIPASE, AMYLASE in the last 168 hours. No results for input(s): AMMONIA in the last 168 hours. Coagulation Profile: Recent Labs  Lab 08/03/19  2214 08/04/19 0509 08/05/19 1121  INR 2.2* 1.8* 1.3*   Cardiac Enzymes: Recent Labs  Lab 08/03/19 2218  CKTOTAL 221   BNP (last 3 results) No results for input(s): PROBNP in the last 8760 hours. HbA1C: No results for input(s): HGBA1C in the last 72 hours. CBG: Recent Labs  Lab 08/05/19 0813  GLUCAP 80   Lipid Profile: No results for input(s): CHOL, HDL, LDLCALC, TRIG, CHOLHDL, LDLDIRECT in the last 72 hours. Thyroid Function Tests: No results for input(s): TSH, T4TOTAL, FREET4, T3FREE, THYROIDAB in the last 72 hours. Anemia Panel: No results for input(s): VITAMINB12, FOLATE, FERRITIN, TIBC, IRON, RETICCTPCT in the last 72 hours. Sepsis Labs: No results for input(s): PROCALCITON, LATICACIDVEN in the last 168 hours.  Recent Results (from the past 240 hour(s))  SARS Coronavirus 2 by RT PCR (hospital order, performed in [facility] hospital lab) Nasopharyngeal Nasopharyngeal Swab     Status: None   Collection Time: 08/03/19 10:53 PM   Specimen: Nasopharyngeal Swab  Result Value Ref Range Status   SARS Coronavirus 2 NEGATIVE NEGATIVE Final    Comment: (NOTE) SARS-CoV-2 target nucleic acids are NOT DETECTED.  The SARS-CoV-2 RNA is generally detectable in upper and lower respiratory specimens during the acute phase of infection. The lowest concentration of SARS-CoV-2 viral copies this assay can detect is 250 copies / mL. A negative result does not preclude SARS-CoV-2 infection and should not be used as the sole basis for treatment or other patient management decisions.  A negative result may occur with improper  specimen collection / handling, submission of specimen other than nasopharyngeal swab, presence of viral mutation(s) within the areas targeted by this assay, and inadequate number of viral copies (<250 copies / mL). A negative result must be combined with clinical observations, patient history, and epidemiological information.  Fact Sheet for Patients:   StrictlyIdeas.no  Fact Sheet for Healthcare Providers: BankingDealers.co.za  This test is not yet approved or  cleared by the [place] FDA and has been authorized for detection and/or diagnosis of SARS-CoV-2 by FDA under an Emergency Use Authorization (EUA).  This EUA will remain in effect (meaning this test can be used) for the duration of the COVID-19 declaration under Section 564(b)(1) of the Act, 21 U.S.C. section 360bbb-3(b)(1), unless the authorization is terminated or revoked sooner.  Performed at [facility], [address] [place], [place] [address]          Radiology Studies: US RENAL  Result Date: 08/04/2019 CLINICAL DATA:  Acute on chronic renal failure EXAM: RENAL / URINARY TRACT ULTRASOUND COMPLETE COMPARISON:  None. FINDINGS: Right Kidney: Renal measurements: 8.6 x 4.0 x 3.7 cm = volume: 67 mL. Mildly increased echogenicity seen throughout. A 1.4 cm shadowing calculus seen within the midpole. No hydronephrosis. Left Kidney: Renal measurements: 8.9 x 3.8 x 3.6 cm. = volume: 63 mL. Mildly increased echogenicity seen throughout. No mass or hydronephrosis visualized. Bladder: Appears normal for degree of bladder distention. Other: Mildly enlarged prostate gland is seen. IMPRESSION: Nonobstructing right renal calculus. Diffusely increased parenchymal echogenicity, consistent with medical renal disease. Electronically Signed   By: Prudencio Pair M.D.   On: 08/04/2019 01:33   DG Chest Portable 1 View  Result Date: 08/03/2019 CLINICAL DATA:  Weakness EXAM:  PORTABLE CHEST 1 VIEW COMPARISON:  11/22/2018 FINDINGS: No focal opacity or pleural effusion. Probable scarring in the left upper lung. Normal cardiomediastinal silhouette with aortic atherosclerosis. No pneumothorax. IMPRESSION: No active disease. Electronically Signed   By: Donavan Foil  M.D.   On: 08/03/2019 23:20        Scheduled Meds: . [MAR Hold] sodium chloride   Intravenous Once  . [MAR Hold] sodium chloride   Intravenous Once  . [MAR Hold] pantoprazole  40 mg Intravenous Q12H  . [MAR Hold] sodium chloride flush  3 mL Intravenous Q12H   Continuous Infusions: . sodium chloride    . pantoprozole (PROTONIX) infusion 8 mg/hr (08/05/19 0901)     LOS: 1 day     Georgette Shell, MD [facility] 08/05/2019, 12:09 PM

## 2019-08-05 NOTE — Progress Notes (Signed)
Talked with Dr. Willette Pa regarding patient recent VS with BP fluctuating between 79/47 - 83/45, Hr 82-87, See Flowsheet. NS at 129ml /hr started per Dr. Ancil Boozer

## 2019-08-05 NOTE — Progress Notes (Signed)
Patient on strict bedrest. Blood transfusion in progress. VSS no reaction. No apparent rectal bleed. No nausea or vomiting. Patient remains NPO for EGD.

## 2019-08-05 NOTE — Progress Notes (Signed)
Patient returned from PACU post EGD. Patient incontinent of urine, condom foley applied. Skin tear noted to Left buttock, barrier cream and foam pad applied. VS noted and awaiting H & H.

## 2019-08-05 NOTE — Progress Notes (Signed)
Dr. Lubertha Basque will contact Dr. Zigmund Daniel

## 2019-08-05 NOTE — Interval H&P Note (Signed)
History and Physical Interval Note:  08/05/2019 8:18 AM  Albert [name]  has presented today for surgery, with the diagnosis of Acute blood loss anemia, longstanding use of NSAIDs.  The various methods of treatment have been discussed with the patient and family. After consideration of risks, benefits and other options for treatment, the patient has consented to  Procedure(s): ESOPHAGOGASTRODUODENOSCOPY (EGD) (N/A) COLONOSCOPY (N/A) as a surgical intervention.  The patient's history has been reviewed, patient examined, no change in status, stable for surgery.  I have reviewed the patient's chart and labs.  Questions were answered to the patient's satisfaction.     [place], [place]

## 2019-08-05 NOTE — Anesthesia Preprocedure Evaluation (Addendum)
Anesthesia Evaluation  Patient identified by MRN, date of birth, ID band Patient awake    Reviewed: Allergy & Precautions, H&P , NPO status , Patient's Chart, lab work & pertinent test results  History of Anesthesia Complications Negative for: history of anesthetic complications  Airway Mallampati: II  TM Distance: >3 FB     Dental  (+) Edentulous Lower, Edentulous Upper   Pulmonary neg pulmonary ROS,    breath sounds clear to auscultation       Cardiovascular hypertension, (-) angina Rhythm:regular Rate:Normal     Neuro/Psych negative neurological ROS  negative psych ROS   GI/Hepatic Neg liver ROS, Active GIB   Endo/Other  negative endocrine ROS  Renal/GU Renal disease (AKI)  negative genitourinary   Musculoskeletal   Abdominal   Peds  Hematology  (+) Blood dyscrasia, anemia , Hgb 6.5 and elevated INR.  Has received 3 units pRBC and 2 units FFP 4th unit pRBC hanging now   Anesthesia Other Findings Past Medical History: 04/23/2008: Actinic keratosis     Comment:  L elbow distal tricep (bx proven) 06/17/2006: Basal cell carcinoma     Comment:  L preauricular  05/26/2012: Basal cell carcinoma     Comment:  L ant neck  11/02/2012: Basal cell carcinoma     Comment:  Glabella - excision  08/15/2014: Basal cell carcinoma     Comment:  L med ankle  02/23/2017: Basal cell carcinoma     Comment:  R lat infrapectoral - excision 12/08/2017: Basal cell carcinoma     Comment:  L mid to distal lat volar forearm  No date: Hypertension 02/14/2014: Squamous cell carcinoma of skin     Comment:  R lat low back - ED&C 10/02/2014: Squamous cell carcinoma of skin     Comment:  R prox lat bicep - ED&C  History reviewed. No pertinent surgical history.  BMI    Body Mass Index: 22.71 kg/m      Reproductive/Obstetrics negative OB ROS                           Anesthesia Physical Anesthesia  Plan  ASA: III  Anesthesia Plan: General   Post-op Pain Management:    Induction:   PONV Risk Score and Plan: Propofol infusion and TIVA  Airway Management Planned:   Additional Equipment:   Intra-op Plan:   Post-operative Plan:   Informed Consent: I have reviewed the patients History and Physical, chart, labs and discussed the procedure including the risks, benefits and alternatives for the proposed anesthesia with the patient or authorized representative who has indicated his/her understanding and acceptance.     Dental Advisory Given  Plan Discussed with: Anesthesiologist, CRNA and Surgeon  Anesthesia Plan Comments:         Anesthesia Quick Evaluation

## 2019-08-06 ENCOUNTER — Inpatient Hospital Stay (HOSPITAL_COMMUNITY)
Admit: 2019-08-06 | Discharge: 2019-08-06 | Disposition: A | Discharge status: Home / Self Care | Payer: Medicare Other | Attending: Internal Medicine | Admitting: Internal Medicine

## 2019-08-06 DIAGNOSIS — I340 Nonrheumatic mitral (valve) insufficiency: Secondary | ICD-10-CM

## 2019-08-06 DIAGNOSIS — I350 Nonrheumatic aortic (valve) stenosis: Secondary | ICD-10-CM

## 2019-08-06 DIAGNOSIS — I351 Nonrheumatic aortic (valve) insufficiency: Secondary | ICD-10-CM

## 2019-08-06 LAB — TYPE AND SCREEN
ABO/RH(D): O POS
Antibody Screen: NEGATIVE
Unit division: 0
Unit division: 0
Unit division: 0
Unit division: 0

## 2019-08-06 LAB — BPAM RBC
Blood Product Expiration Date: 202108272359
Blood Product Expiration Date: 202108272359
Blood Product Expiration Date: 202108272359
Blood Product Expiration Date: 202108272359
ISSUE DATE / TIME: 202107300004
ISSUE DATE / TIME: 202107300617
ISSUE DATE / TIME: 202107310544
Unit Type and Rh: 5100
Unit Type and Rh: 5100
Unit Type and Rh: 5100
Unit Type and Rh: 5100

## 2019-08-06 LAB — CBC
HCT: 23.3 % — ABNORMAL LOW (ref 39.0–52.0)
Hemoglobin: 7.6 g/dL — ABNORMAL LOW (ref 13.0–17.0)
MCH: 30.3 pg (ref 26.0–34.0)
MCHC: 32.6 g/dL (ref 30.0–36.0)
MCV: 92.8 fL (ref 80.0–100.0)
Platelets: 99 10*3/uL — ABNORMAL LOW (ref 150–400)
RBC: 2.51 MIL/uL — ABNORMAL LOW (ref 4.22–5.81)
RDW: 16.8 % — ABNORMAL HIGH (ref 11.5–15.5)
WBC: 5 10*3/uL (ref 4.0–10.5)
nRBC: 1.2 % — ABNORMAL HIGH (ref 0.0–0.2)

## 2019-08-06 LAB — BASIC METABOLIC PANEL
Anion gap: 10 (ref 5–15)
BUN: 82 mg/dL — ABNORMAL HIGH (ref 8–23)
CO2: 23 mmol/L (ref 22–32)
Calcium: 7.3 mg/dL — ABNORMAL LOW (ref 8.9–10.3)
Chloride: 110 mmol/L (ref 98–111)
Creatinine, Ser: 1.99 mg/dL — ABNORMAL HIGH (ref 0.61–1.24)
GFR calc Af Amer: 35 mL/min — ABNORMAL LOW (ref >60)
GFR calc non Af Amer: 30 mL/min — ABNORMAL LOW (ref >60)
Glucose, Bld: 100 mg/dL — ABNORMAL HIGH (ref 70–99)
Potassium: 3.3 mmol/L — ABNORMAL LOW (ref 3.5–5.1)
Sodium: 143 mmol/L (ref 135–145)

## 2019-08-06 LAB — ECHOCARDIOGRAM COMPLETE
AR max vel: 1.47 cm2
AV Area VTI: 1.48 cm2
AV Area mean vel: 1.37 cm2
AV Mean grad: 18 mmHg
AV Peak grad: 17.3 mmHg
Ao pk vel: 2.08 m/s
Area-P 1/2: 2.47 cm2
Height: 64 in
MV M vel: 5.75 m/s
MV Peak grad: 132.4 mmHg
P 1/2 time: 398 msec
S' Lateral: 3.68 cm
Weight: 2116.42 oz

## 2019-08-06 LAB — PREPARE RBC (CROSSMATCH)

## 2019-08-06 MED ORDER — POTASSIUM CHLORIDE 10 MEQ/100ML IV SOLN
10.0000 meq | INTRAVENOUS | Status: AC
  Administered 2019-08-06 (×3): 10 meq via INTRAVENOUS
  Filled 2019-08-06 (×3): qty 100

## 2019-08-06 MED ORDER — PANTOPRAZOLE SODIUM 40 MG PO TBEC
40.0000 mg | DELAYED_RELEASE_TABLET | Freq: Every day | ORAL | Status: DC
  Administered 2019-08-06 – 2019-08-08 (×3): 40 mg via ORAL
  Filled 2019-08-06 (×3): qty 1

## 2019-08-06 NOTE — Progress Notes (Signed)
PT Cancellation Note  Patient Details Name: Albert [name] MRN: [id number] DOB: 1933-04-03   Cancelled Treatment:    Reason Eval/Treat Not Completed: Patient declined, no reason specified. Despite encouragement, patient refused any mobility this afternoon. States he is not feeling well. Will re-attempt tomorrow. RN notified.      Eliya Bubar 08/06/2019, 2:05 PM

## 2019-08-06 NOTE — Progress Notes (Signed)
PROGRESS NOTE    Albert [name]  QIO:[id number] DOB: Jun 18, 1933 DOA: 08/03/2019 PCP: [name], MD   Brief Narrative:Albert [name] is a 85 y.o. male with medical history significant for hypertension, chronic kidney disease stage IIIa, and chronic pain, now presenting to the emergency department with generalized weakness and GI bleeding.  Patient reports that he began to feel weak in general approximately 1 week ago, progressed to the point where he had difficulty even getting out of bed today.  Yesterday, he developed some vague abdominal discomfort with nausea, dry heaving, and diarrhea.  Patient denied seeing any blood in his stool but EMS reports finding him in a pool of maroon blood.  Patient denies any alcohol use or history of liver disease.  He takes aspirin and Celebrex daily, and does not take a PPI or H2 blocker.  He denies any chest pain, fevers, chills, cough, shortness of breath, or leg swelling or tenderness.  ED Course: Upon arrival to the ED, patient is found to be afebrile, saturating well on room air, mildly tachycardic, and with systolic blood pressure in the 90s.  EKG features sinus tachycardia with rate 110, PVCs, and T wave inversions.  Chest x-ray is negative for acute cardiopulmonary disease.  Chemistry panel concerning for bicarbonate of 16, BUN 121, and creatinine 2.63, up from 1.3 in April.  CBC is notable for a chronic leukocytosis and a new anemia with hemoglobin 6.0, down from 12.1 in April.  INR is elevated to 2.2.  Gastroenterology was consulted by the ED physician and the patient was given a liter of lactated Ringer's, IV vitamin K, IV Protonix, and 1 unit of red blood cells.  Assessment & Plan:   Principal Problem:   Acute GI bleeding Active Problems:   Acute renal failure superimposed on stage 3a chronic kidney disease (HCC)   Benign essential hypertension   Symptomatic anemia   Coagulopathy (HCC)   Metabolic acidosis   SIRS (systemic inflammatory  response syndrome) (HCC)   UGI bleed     #1 acute GI bleeding-status post EGD 08/05/2019-esophageal ulcer with no stigmata of recent bleeding with reflux esophagitis, 2 cm hiatal hernia and erythematous mucosa in the antrum biopsied.  Nonbleeding duodenal ulcers. Colonoscopy 08/05/2019-poor colonic preparation.  Diverticulosis and blood in the rectum and sigmoid colon with stool.  No biopsy done. Advance diet as tolerated.  Continue Protonix p.o.  DC IV PPI. If patient goes into active bleeding will contact RBC scan. Hemoglobin 7.6 today remained stable.  #2 AKI on CKD stage IIIb his baseline creatinine is around 1.3 at the time of admission his creatinine is 2.63.  BUN elevated at 108 consistent with GI bleed.  BUN improving down to 82.  This could very well likely be prerenal in the setting of diarrhea diuretic use and blood loss.  Patient also takes NSAIDs on a regular basis.  #3 essential hypertension-blood pressure is 102/57.  Continue to hold antihypertensives.   Estimated body mass index is 22.71 kg/m as calculated from the following:   Height as of this encounter: 5\' 4"  (1.626 m).   Weight as of this encounter: 60 kg.  DVT prophylaxis: SCD Code Status: Full code  family Communication: None at bedside Disposition Plan:  Status is: Inpatient  Dispo: The patient is from: Home              Anticipated d/c is to: Unknown              Anticipated d/c date is:  2 days              Patient currently is not medically stable to d/c.   Patient admitted with acute GI bleed work-up pending continuing to get blood transfusion.    Consultants:   GI  Procedures: None Antimicrobials: None Subjective: He is resting in bed he is anxious to have some food denies any new complaints no further melena no nausea vomiting reported. Objective: Condom cath in place Vitals:   08/06/19 0500 08/06/19 0739 08/06/19 0800 08/06/19 1141  BP: (!) 106/52 (!) 99/57 101/65 (!) 102/57  Pulse:  79 76 83    Resp: 16 19 (!) 10 18  Temp:  97.6 F (36.4 C)  98.2 F (36.8 C)  TempSrc:  Oral  Oral  SpO2:  100% 100% 99%  Weight:      Height:        Intake/Output Summary (Last 24 hours) at 08/06/2019 1258 Last data filed at 08/06/2019 0742 Gross per 24 hour  Intake 1712.32 ml  Output 2400 ml  Net -687.68 ml   Filed Weights   08/03/19 2207 08/05/19 0545  Weight: 55.3 kg 60 kg    Examination:  General exam: Appears ill appearing and pale Respiratory system: Clear to auscultation. Respiratory effort normal. Cardiovascular system: S1 & S2 heard, RRR. No JVD, murmurs, rubs, gallops or clicks. No pedal edema. Gastrointestinal system: Abdomen is nondistended, soft and nontender. No organomegaly or masses felt. Normal bowel sounds heard. Central nervous system: Alert and oriented. No focal neurological deficits. Extremities: Symmetric 5 x 5 power. Skin: No rashes, lesions or ulcers Psychiatry: Judgement and insight appear normal. Mood & affect appropriate.     Data Reviewed: I have personally reviewed following labs and imaging studies  CBC: Recent Labs  Lab 08/03/19 2214 08/04/19 0509 08/04/19 0631 08/04/19 0631 08/04/19 1640 08/05/19 0407 08/05/19 1121 08/05/19 1732 08/06/19 0442  WBC 16.2*  --  13.6*  --  10.5 6.3  --  6.1 5.0  NEUTROABS 12.9*  --   --   --   --   --   --   --   --   HGB 6.0*   < > 5.7*   < > 7.1* 6.5* 7.5* 7.5* 7.6*  HCT 18.2*   < > 16.3*   < > 19.7* 19.8* 21.8* 23.1* 23.3*  MCV 93.8  --  87.2  --  86.4 90.0  --  92.4 92.8  PLT 211  --  123*  --  122* 116*  --  101* 99*   < > = values in this interval not displayed.   Basic Metabolic Panel: Recent Labs  Lab 08/03/19 2214 08/04/19 0631 08/04/19 1640 08/05/19 0407 08/06/19 0442  NA 139  --  141 137 143  K 5.1  --  4.2 3.7 3.3*  CL 107  --  101 100 110  CO2 16*  --  24 24 23   GLUCOSE 170*  --  106* 93 100*  BUN 121*  --  120* 108* 82*  CREATININE 2.63*  --  2.77* 2.58* 1.99*  CALCIUM 7.6*  --   7.3* 7.2* 7.3*  MG  --  1.2*  --   --   --    GFR: Estimated Creatinine Clearance: 23.1 mL/min (A) (by C-G formula based on SCr of 1.99 mg/dL (H)). Liver Function Tests: Recent Labs  Lab 08/03/19 2214 08/04/19 0631  AST 42* 23  ALT 13 9  ALKPHOS 48 29*  BILITOT 0.9 0.8  PROT 4.7* <3.0*  ALBUMIN 2.4* 1.6*   No results for input(s): LIPASE, AMYLASE in the last 168 hours. No results for input(s): AMMONIA in the last 168 hours. Coagulation Profile: Recent Labs  Lab 08/03/19 2214 08/04/19 0509 08/05/19 1121  INR 2.2* 1.8* 1.3*   Cardiac Enzymes: Recent Labs  Lab 08/03/19 2218  CKTOTAL 221   BNP (last 3 results) No results for input(s): PROBNP in the last 8760 hours. HbA1C: No results for input(s): HGBA1C in the last 72 hours. CBG: Recent Labs  Lab 08/05/19 0813  GLUCAP 80   Lipid Profile: No results for input(s): CHOL, HDL, LDLCALC, TRIG, CHOLHDL, LDLDIRECT in the last 72 hours. Thyroid Function Tests: No results for input(s): TSH, T4TOTAL, FREET4, T3FREE, THYROIDAB in the last 72 hours. Anemia Panel: No results for input(s): VITAMINB12, FOLATE, FERRITIN, TIBC, IRON, RETICCTPCT in the last 72 hours. Sepsis Labs: No results for input(s): PROCALCITON, LATICACIDVEN in the last 168 hours.  Recent Results (from the past 240 hour(s))  SARS Coronavirus 2 by RT PCR (hospital order, performed in [facility] hospital lab) Nasopharyngeal Nasopharyngeal Swab     Status: None   Collection Time: 08/03/19 10:53 PM   Specimen: Nasopharyngeal Swab  Result Value Ref Range Status   SARS Coronavirus 2 NEGATIVE NEGATIVE Final    Comment: (NOTE) SARS-CoV-2 target nucleic acids are NOT DETECTED.  The SARS-CoV-2 RNA is generally detectable in upper and lower respiratory specimens during the acute phase of infection. The lowest concentration of SARS-CoV-2 viral copies this assay can detect is 250 copies / mL. A negative result does not preclude SARS-CoV-2 infection and should not  be used as the sole basis for treatment or other patient management decisions.  A negative result may occur with improper specimen collection / handling, submission of specimen other than nasopharyngeal swab, presence of viral mutation(s) within the areas targeted by this assay, and inadequate number of viral copies (<250 copies / mL). A negative result must be combined with clinical observations, patient history, and epidemiological information.  Fact Sheet for Patients:   StrictlyIdeas.no  Fact Sheet for Healthcare Providers: BankingDealers.co.za  This test is not yet approved or  cleared by the [place] FDA and has been authorized for detection and/or diagnosis of SARS-CoV-2 by FDA under an Emergency Use Authorization (EUA).  This EUA will remain in effect (meaning this test can be used) for the duration of the COVID-19 declaration under Section 564(b)(1) of the Act, 21 U.S.C. section 360bbb-3(b)(1), unless the authorization is terminated or revoked sooner.  Performed at [facility], [address] [place], [place] [address]          Radiology Studies: No results found.      Scheduled Meds: . sodium chloride   Intravenous Once  . sodium chloride   Intravenous Once  . feeding supplement  1 Container Oral TID BM  . multivitamin with minerals  1 tablet Oral Daily  . pantoprazole  40 mg Oral Daily  . sodium chloride flush  3 mL Intravenous Q12H   Continuous Infusions: . potassium chloride       LOS: 2 days     Georgette Shell, MD [facility] 08/06/2019, 12:58 PM

## 2019-08-06 NOTE — Progress Notes (Signed)
Complete bed linen changed Old foam drsg removed from rt buttock hip area, site healing with new protective cream applied and foam drsg applied

## 2019-08-06 NOTE — Progress Notes (Signed)
GI Inpatient Follow-up Note  Subjective:  Patient seen in follow-up for post-hemorrhagic anemia. He is s/p EGD and colonosocopy yesterday by Dr. Alice Reichert. EGD significant for esophageal ulcer with no stigmata of bleeding, LA Grade A reflux esophagitis with no bleeding, gastritis, and two non-bleeding duodenal ulcers. Colonoscopy with poor visualization due to inadequate prep. No acute overnight events. Pt denies any bowel movements. No complaints of nausea, vomiting, or abdominal pain. No evidence of recurrent episodes of hematochezia. He is tolerating clear liquid diet for breakfast.   Scheduled Inpatient Medications:  . sodium chloride   Intravenous Once  . sodium chloride   Intravenous Once  . feeding supplement  1 Container Oral TID BM  . multivitamin with minerals  1 tablet Oral Daily  . [START ON 08/07/2019] pantoprazole  40 mg Intravenous Q12H  . sodium chloride flush  3 mL Intravenous Q12H    Continuous Inpatient Infusions:   . pantoprozole (PROTONIX) infusion 8 mg/hr (08/05/19 2023)  . potassium chloride      PRN Inpatient Medications:  fentaNYL (SUBLIMAZE) injection, ondansetron **OR** ondansetron (ZOFRAN) IV  Review of Systems: Constitutional: Weight is stable.  Eyes: No changes in vision. ENT: No oral lesions, sore throat.  GI: see HPI.  Heme/Lymph: No easy bruising.  CV: No chest pain.  GU: No hematuria.  Integumentary: No rashes.  Neuro: No headaches.  Psych: No depression/anxiety.  Endocrine: No heat/cold intolerance.  Allergic/Immunologic: No urticaria.  Resp: No cough, SOB.  Musculoskeletal: No joint swelling.    Physical Examination: BP 101/65 (BP Location: Left Arm)   Pulse 76   Temp 97.6 F (36.4 C) (Oral)   Resp (!) 10   Ht 5\' 4"  (1.626 m)   Wt 60 kg   SpO2 100%   BMI 22.71 kg/m  Gen: NAD, alert and oriented x 4 HEENT: PEERLA, EOMI, Neck: supple, no JVD or thyromegaly Chest: CTA bilaterally, no wheezes, crackles, or other adventitious sounds CV:  RRR, no m/g/c/r Abd: soft, NT, ND, +BS in all four quadrants; no HSM, guarding, ridigity, or rebound tenderness Ext: no edema, well perfused with 2+ pulses, Skin: no rash or lesions noted Lymph: no LAD  Data: Lab Results  Component Value Date   WBC 5.0 08/06/2019   HGB 7.6 (L) 08/06/2019   HCT 23.3 (L) 08/06/2019   MCV 92.8 08/06/2019   PLT 99 (L) 08/06/2019   Recent Labs  Lab 08/05/19 1121 08/05/19 1732 08/06/19 0442  HGB 7.5* 7.5* 7.6*   Lab Results  Component Value Date   NA 143 08/06/2019   K 3.3 (L) 08/06/2019   CL 110 08/06/2019   CO2 23 08/06/2019   BUN 82 (H) 08/06/2019   CREATININE 1.99 (H) 08/06/2019   Lab Results  Component Value Date   ALT 9 08/04/2019   AST 23 08/04/2019   ALKPHOS 29 (L) 08/04/2019   BILITOT 0.8 08/04/2019   Recent Labs  Lab 08/03/19 2214 08/04/19 0509 08/05/19 1121  APTT 36  --   --   INR 2.2*   < > 1.3*   < > = values in this interval not displayed.   Assessment/Plan:  86 y/o male with a PMH of chronic pain syndrome, HTN, CKD Stage IIIa admitted to the hospital for symptomatic anemia  1. Posthemorrhagic anemia - s/p EGD and colonoscopy showing esophageal ulcer and two duodenal ulcers with no stigmata of bleeding. Hemoglobin 7.6 this morning.   2. Hematochezia - no evidence of recurrent bleeding, hemodynamically stable  3. Acute on chronic  kidney disease - BUN 82, Cr 1.99 today  4. Coagulopathy - unknown etiology, INR 1.3 yesterday before procedure  5. Longstanding history of daily NSAID use  COVID-19 test: NEGATIVE  Recommendations:  - We reviewed EGD and colonosocopy procedure reports from yesterday - No evidence of recurrent bleeding. Hemodynamically stable. - Advance diet as tolerated - If evidence of bleeding, would advise tagged RBC scan - D/C IV PPI, start Protonix 40 mg PO daily  - Continue electrolyte replacement/IVF hydration for treatment of hypokalemia and AKI    Please call with questions or  concerns.   Octavia Bruckner, PA-C [facility] [phone] (Cell)

## 2019-08-07 ENCOUNTER — Encounter: Payer: Self-pay | Admitting: Internal Medicine

## 2019-08-07 LAB — BASIC METABOLIC PANEL
Anion gap: 9 (ref 5–15)
BUN: 55 mg/dL — ABNORMAL HIGH (ref 8–23)
CO2: 25 mmol/L (ref 22–32)
Calcium: 7.2 mg/dL — ABNORMAL LOW (ref 8.9–10.3)
Chloride: 109 mmol/L (ref 98–111)
Creatinine, Ser: 1.68 mg/dL — ABNORMAL HIGH (ref 0.61–1.24)
GFR calc Af Amer: 43 mL/min — ABNORMAL LOW (ref >60)
GFR calc non Af Amer: 37 mL/min — ABNORMAL LOW (ref >60)
Glucose, Bld: 95 mg/dL (ref 70–99)
Potassium: 4 mmol/L (ref 3.5–5.1)
Sodium: 143 mmol/L (ref 135–145)

## 2019-08-07 LAB — CBC
HCT: 24.9 % — ABNORMAL LOW (ref 39.0–52.0)
Hemoglobin: 8.3 g/dL — ABNORMAL LOW (ref 13.0–17.0)
MCH: 30.4 pg (ref 26.0–34.0)
MCHC: 33.3 g/dL (ref 30.0–36.0)
MCV: 91.2 fL (ref 80.0–100.0)
Platelets: 119 10*3/uL — ABNORMAL LOW (ref 150–400)
RBC: 2.73 MIL/uL — ABNORMAL LOW (ref 4.22–5.81)
RDW: 17.7 % — ABNORMAL HIGH (ref 11.5–15.5)
WBC: 7.2 10*3/uL (ref 4.0–10.5)
nRBC: 0 % (ref 0.0–0.2)

## 2019-08-07 MED ORDER — ENSURE ENLIVE PO LIQD
237.0000 mL | Freq: Two times a day (BID) | ORAL | Status: DC
  Administered 2019-08-07 – 2019-08-08 (×3): 237 mL via ORAL

## 2019-08-07 MED ORDER — LOPERAMIDE HCL 2 MG PO CAPS
2.0000 mg | ORAL_CAPSULE | Freq: Once | ORAL | Status: AC
  Administered 2019-08-07: 2 mg via ORAL
  Filled 2019-08-07: qty 1

## 2019-08-07 NOTE — Progress Notes (Signed)
PROGRESS NOTE    Albert [name]  OJJ:[id number] DOB: 1933/02/11 DOA: 08/03/2019 PCP: [name], MD   Brief Narrative:Albert [name] is a 86 y.o. male with medical history significant for hypertension, chronic kidney disease stage IIIa, and chronic pain, now presenting to the emergency department with generalized weakness and GI bleeding.  Patient reports that he began to feel weak in general approximately 1 week ago, progressed to the point where he had difficulty even getting out of bed today.  Yesterday, he developed some vague abdominal discomfort with nausea, dry heaving, and diarrhea.  Patient denied seeing any blood in his stool but EMS reports finding him in a pool of maroon blood.  Patient denies any alcohol use or history of liver disease.  He takes aspirin and Celebrex daily, and does not take a PPI or H2 blocker.  He denies any chest pain, fevers, chills, cough, shortness of breath, or leg swelling or tenderness.  ED Course: Upon arrival to the ED, patient is found to be afebrile, saturating well on room air, mildly tachycardic, and with systolic blood pressure in the 90s.  EKG features sinus tachycardia with rate 110, PVCs, and T wave inversions.  Chest x-ray is negative for acute cardiopulmonary disease.  Chemistry panel concerning for bicarbonate of 16, BUN 121, and creatinine 2.63, up from 1.3 in April.  CBC is notable for a chronic leukocytosis and a new anemia with hemoglobin 6.0, down from 12.1 in April.  INR is elevated to 2.2.  Gastroenterology was consulted by the ED physician and the patient was given a liter of lactated Ringer's, IV vitamin K, IV Protonix, and 1 unit of red blood cells.  Assessment & Plan:   Principal Problem:   Acute GI bleeding Active Problems:   Acute renal failure superimposed on stage 3a chronic kidney disease (HCC)   Benign essential hypertension   Symptomatic anemia   Coagulopathy (HCC)   Metabolic acidosis   SIRS (systemic inflammatory  response syndrome) (HCC)   UGI bleed     #1 acute GI bleeding-status post EGD 08/05/2019-esophageal ulcer with no stigmata of recent bleeding with reflux esophagitis, 2 cm hiatal hernia and erythematous mucosa in the antrum biopsied.  Nonbleeding duodenal ulcers. Colonoscopy 08/05/2019-poor colonic preparation.  Diverticulosis and blood in the rectum and sigmoid colon with stool.  No biopsy done. Advance diet as tolerated.  Continue Protonix p.o.  DC IV PPI. If patient goes into active bleeding will contact RBC scan. Hemoglobin 7.6 today remained stable. Cbc pending today  #2 AKI on CKD stage IIIb -due to consumption of NSAIDs and GI bleed.  His baseline creatinine is around 1.3 at the time of admission his creatinine is 2.63.  BUN elevated at 108 consistent with GI bleed.  BUN improving down to 55.    #3 essential hypertension-blood pressure is 97/59.  Patient takes antihypertensives at home which has been on hold.  Continue the same.    Estimated body mass index is 22.48 kg/m as calculated from the following:   Height as of this encounter: 5\' 4"  (1.626 m).   Weight as of this encounter: 59.4 kg.  DVT prophylaxis: SCD Code Status: Full code  family Communication: None at bedside Disposition Plan:  Status is: Inpatient  Dispo: The patient is from: Home              Anticipated d/c is to: Unknown              Anticipated d/c date is: 2 days  Patient currently is medically stable to be discharged.  PT recommending SNF.toc aware   Consultants:   GI  Procedures: None Antimicrobials: None Subjective: Patient is resting in bed he tolerated a regular diet today he denies any further nausea vomiting or diarrhea. Objective: Condom cath in place Vitals:   08/07/19 0900 08/07/19 1000 08/07/19 1100 08/07/19 1200  BP:    (!) 97/59  Pulse: 84 88 86 88  Resp: 15 (!) 22 15 17   Temp:    98 F (36.7 C)  TempSrc:      SpO2: 100% 99% 95% 97%  Weight:      Height:         Intake/Output Summary (Last 24 hours) at 08/07/2019 1333 Last data filed at 08/07/2019 1149 Gross per 24 hour  Intake 240 ml  Output 1900 ml  Net -1660 ml   Filed Weights   08/03/19 2207 08/05/19 0545 08/07/19 0500  Weight: 55.3 kg 60 kg 59.4 kg    Examination:  General exam: Appears ill appearing and pale Respiratory system: Clear to auscultation. Respiratory effort normal. Cardiovascular system: S1 & S2 heard, RRR. No JVD, murmurs, rubs, gallops or clicks. No pedal edema. Gastrointestinal system: Abdomen is nondistended, soft and nontender. No organomegaly or masses felt. Normal bowel sounds heard. Central nervous system: Alert and oriented. No focal neurological deficits. Extremities: Symmetric 5 x 5 power. Skin: No rashes, lesions or ulcers Psychiatry: Judgement and insight appear normal. Mood & affect appropriate.     Data Reviewed: I have personally reviewed following labs and imaging studies  CBC: Recent Labs  Lab 08/03/19 2214 08/04/19 0509 08/04/19 0631 08/04/19 0631 08/04/19 1640 08/05/19 0407 08/05/19 1121 08/05/19 1732 08/06/19 0442  WBC 16.2*  --  13.6*  --  10.5 6.3  --  6.1 5.0  NEUTROABS 12.9*  --   --   --   --   --   --   --   --   HGB 6.0*   < > 5.7*   < > 7.1* 6.5* 7.5* 7.5* 7.6*  HCT 18.2*   < > 16.3*   < > 19.7* 19.8* 21.8* 23.1* 23.3*  MCV 93.8  --  87.2  --  86.4 90.0  --  92.4 92.8  PLT 211  --  123*  --  122* 116*  --  101* 99*   < > = values in this interval not displayed.   Basic Metabolic Panel: Recent Labs  Lab 08/03/19 2214 08/04/19 0631 08/04/19 1640 08/05/19 0407 08/06/19 0442 08/07/19 0443  NA 139  --  141 137 143 143  K 5.1  --  4.2 3.7 3.3* 4.0  CL 107  --  101 100 110 109  CO2 16*  --  24 24 23 25   GLUCOSE 170*  --  106* 93 100* 95  BUN 121*  --  120* 108* 82* 55*  CREATININE 2.63*  --  2.77* 2.58* 1.99* 1.68*  CALCIUM 7.6*  --  7.3* 7.2* 7.3* 7.2*  MG  --  1.2*  --   --   --   --    GFR: Estimated Creatinine  Clearance: 27.4 mL/min (A) (by C-G formula based on SCr of 1.68 mg/dL (H)). Liver Function Tests: Recent Labs  Lab 08/03/19 2214 08/04/19 0631  AST 42* 23  ALT 13 9  ALKPHOS 48 29*  BILITOT 0.9 0.8  PROT 4.7* <3.0*  ALBUMIN 2.4* 1.6*   No results for input(s): LIPASE, AMYLASE in the last  168 hours. No results for input(s): AMMONIA in the last 168 hours. Coagulation Profile: Recent Labs  Lab 08/03/19 2214 08/04/19 0509 08/05/19 1121  INR 2.2* 1.8* 1.3*   Cardiac Enzymes: Recent Labs  Lab 08/03/19 2218  CKTOTAL 221   BNP (last 3 results) No results for input(s): PROBNP in the last 8760 hours. HbA1C: No results for input(s): HGBA1C in the last 72 hours. CBG: Recent Labs  Lab 08/05/19 0813  GLUCAP 80   Lipid Profile: No results for input(s): CHOL, HDL, LDLCALC, TRIG, CHOLHDL, LDLDIRECT in the last 72 hours. Thyroid Function Tests: No results for input(s): TSH, T4TOTAL, FREET4, T3FREE, THYROIDAB in the last 72 hours. Anemia Panel: No results for input(s): VITAMINB12, FOLATE, FERRITIN, TIBC, IRON, RETICCTPCT in the last 72 hours. Sepsis Labs: No results for input(s): PROCALCITON, LATICACIDVEN in the last 168 hours.  Recent Results (from the past 240 hour(s))  SARS Coronavirus 2 by RT PCR (hospital order, performed in [facility] hospital lab) Nasopharyngeal Nasopharyngeal Swab     Status: None   Collection Time: 08/03/19 10:53 PM   Specimen: Nasopharyngeal Swab  Result Value Ref Range Status   SARS Coronavirus 2 NEGATIVE NEGATIVE Final    Comment: (NOTE) SARS-CoV-2 target nucleic acids are NOT DETECTED.  The SARS-CoV-2 RNA is generally detectable in upper and lower respiratory specimens during the acute phase of infection. The lowest concentration of SARS-CoV-2 viral copies this assay can detect is 250 copies / mL. A negative result does not preclude SARS-CoV-2 infection and should not be used as the sole basis for treatment or other patient management  decisions.  A negative result may occur with improper specimen collection / handling, submission of specimen other than nasopharyngeal swab, presence of viral mutation(s) within the areas targeted by this assay, and inadequate number of viral copies (<250 copies / mL). A negative result must be combined with clinical observations, patient history, and epidemiological information.  Fact Sheet for Patients:   StrictlyIdeas.no  Fact Sheet for Healthcare Providers: BankingDealers.co.za  This test is not yet approved or  cleared by the [place] FDA and has been authorized for detection and/or diagnosis of SARS-CoV-2 by FDA under an Emergency Use Authorization (EUA).  This EUA will remain in effect (meaning this test can be used) for the duration of the COVID-19 declaration under Section 564(b)(1) of the Act, 21 U.S.C. section 360bbb-3(b)(1), unless the authorization is terminated or revoked sooner.  Performed at [facility], [address] [place], [place] [address]          Radiology Studies: ECHOCARDIOGRAM COMPLETE  Result Date: 08/06/2019    ECHOCARDIOGRAM REPORT   Patient Name:   Albert [name] Date of Exam: 08/06/2019 Medical Rec #:  [id number]       Height:       64.0 in Accession #:    [id number]      Weight:       132.3 lb Date of Birth:  Jul 18, 1933        BSA:          1.641 m Patient Age:    33 years        BP:           101/65 mmHg Patient Gender: M               HR:           77 bpm. Exam Location:  [facility] Procedure: 2D Echo Indications:     ABNORMAL ECG 794.31/R94.31  History:         Patient has no prior history of Echocardiogram examinations.                  Risk Factors:Hypertension. CKD.  Sonographer:     Avanell Shackleton Referring Phys:  [id number] [place] Diagnosing Phys: Kirk Ruths MD  Sonographer Comments: Technically difficult study due to poor echo windows. Image acquisition challenging due to  uncooperative patient. TDS. IT WAS DIFFICULT TO GET ADEQUATE ASSESSMENT OF THE AOV. PT HAS LIMITED MOBILITY DUE RA. IMPRESSIONS  1. Left ventricular ejection fraction, by estimation, is 60 to 65%. The left ventricle has normal function. The left ventricle has no regional wall motion abnormalities. There is mild left ventricular hypertrophy. Left ventricular diastolic parameters are consistent with Grade I diastolic dysfunction (impaired relaxation).  2. Right ventricular systolic function is normal. The right ventricular size is moderately enlarged.  3. Left atrial size was severely dilated.  4. Right atrial size was mildly dilated.  5. The mitral valve is normal in structure. Mild mitral valve regurgitation. No evidence of mitral stenosis.  6. The aortic valve has an indeterminant number of cusps. Aortic valve regurgitation is mild. Mild to moderate aortic valve stenosis. FINDINGS  Left Ventricle: Left ventricular ejection fraction, by estimation, is 60 to 65%. The left ventricle has normal function. The left ventricle has no regional wall motion abnormalities. The left ventricular internal cavity size was normal in size. There is  mild left ventricular hypertrophy. Left ventricular diastolic parameters are consistent with Grade I diastolic dysfunction (impaired relaxation). Right Ventricle: The right ventricular size is moderately enlarged. Right ventricular systolic function is normal. Left Atrium: Left atrial size was severely dilated. Right Atrium: Right atrial size was mildly dilated. Pericardium: There is no evidence of pericardial effusion. Mitral Valve: The mitral valve is normal in structure. Normal mobility of the mitral valve leaflets. Mild mitral annular calcification. Mild mitral valve regurgitation. No evidence of mitral valve stenosis. Tricuspid Valve: The tricuspid valve is normal in structure. Tricuspid valve regurgitation is mild . No evidence of tricuspid stenosis. Aortic Valve: The aortic valve  has an indeterminant number of cusps. Aortic valve regurgitation is mild. Aortic regurgitation PHT measures 398 msec. Mild to moderate aortic stenosis is present. Aortic valve mean gradient measures 18.0 mmHg. Aortic valve peak gradient measures 17.3 mmHg. Aortic valve area, by VTI measures 1.48 cm. Pulmonic Valve: The pulmonic valve was not well visualized. Pulmonic valve regurgitation is trivial. No evidence of pulmonic stenosis. Aorta: The aortic root is normal in size and structure. Venous: The inferior vena cava was not well visualized.  LEFT VENTRICLE PLAX 2D LVIDd:         5.16 cm  Diastology LVIDs:         3.68 cm  LV e' lateral:   8.38 cm/s LV PW:         1.20 cm  LV E/e' lateral: 10.0 LV IVS:        1.21 cm  LV e' medial:    7.07 cm/s LVOT diam:     1.60 cm  LV E/e' medial:  11.8 LV SV:         63 LV SV Index:   39 LVOT Area:     2.01 cm  LEFT ATRIUM             Index       RIGHT ATRIUM           Index LA diam:  4.80 cm 2.92 cm/m  RA Area:     18.20 cm LA Vol (A2C):   94.5 ml 57.58 ml/m RA Volume:   53.40 ml  32.54 ml/m LA Vol (A4C):   65.0 ml 39.61 ml/m LA Biplane Vol: 86.6 ml 52.77 ml/m  AORTIC VALVE AV Area (Vmax):    1.47 cm AV Area (Vmean):   1.37 cm AV Area (VTI):     1.48 cm AV Vmax:           208.00 cm/s AV Vmean:          156.000 cm/s AV VTI:            0.429 m AV Peak Grad:      17.3 mmHg AV Mean Grad:      18.0 mmHg LVOT Vmax:         152.00 cm/s LVOT Vmean:        106.000 cm/s LVOT VTI:          0.315 m LVOT/AV VTI ratio: 0.73 AI PHT:            398 msec  AORTA Ao Root diam: 3.60 cm MITRAL VALVE                TRICUSPID VALVE MV Area (PHT): 2.47 cm     TR Peak grad:   33.4 mmHg MV Decel Time: 307 msec     TR Vmax:        289.00 cm/s MR Peak grad: 132.4 mmHg MR Mean grad: 89.3 mmHg     SHUNTS MR Vmax:      575.33 cm/s   Systemic VTI:  0.32 m MR Vmean:     446.3 cm/s    Systemic Diam: 1.60 cm MV E velocity: 83.70 cm/s MV A velocity: 103.00 cm/s MV E/A ratio:  0.81 Kirk Ruths  MD Electronically signed by Kirk Ruths MD Signature Date/Time: 08/06/2019/1:17:00 PM    Final         Scheduled Meds: . feeding supplement (ENSURE ENLIVE)  237 mL Oral BID BM  . multivitamin with minerals  1 tablet Oral Daily  . pantoprazole  40 mg Oral Daily  . sodium chloride flush  3 mL Intravenous Q12H   Continuous Infusions:    LOS: 3 days     Georgette Shell, MD [facility] 08/07/2019, 1:33 PM

## 2019-08-07 NOTE — Care Management Important Message (Signed)
Important Message  Patient Details  Name: Albert [name] MRN: [id number] Date of Birth: 1933/01/21   Medicare Important Message Given:  Yes     [name] 08/07/2019, 1:33 PM

## 2019-08-07 NOTE — Evaluation (Signed)
Physical Therapy Evaluation Patient Details Name: Albert [name] MRN: [id number] DOB: [birth year] Today's Date: 08/07/2019   History of Present Illness  Pt is an 86 y.o. male presenting to hospital 7/29 with weakness, nausea, and bloody diarrhea.  Pt admitted with symptomatic anemia, AKI superimposed on CKD IIIa, metabolic acidosis, coagulopathy, htn, and SIRS.  Pt s/p EGD and colonsocopy; s/p blood transfusions.  PMH includes COVID-19 (11/2018), htn, CKD, basal cell carcinoma, htn, CKD.  Clinical Impression  Prior to hospital admission, pt was ambulatory with [facility]; lives in 1 level home with small 3" step to enter; pt's cousin comes to assist pt 3x/week with groceries and MD appts.  Currently pt is min to mod assist with logrolling to L in bed (x2 trials).  1st trial pt with diarrhea--NT notified and came to assist pt with clean-up.  Therapist returned later in the morning to attempt OOB mobility again but before therapist able to place adult diaper (d/t incontinence concerns) pt with diarrhea again with logrolling to L side (NT arrived and assisting pt with clean-up).  Pt reports h/o diarrhea but currently made worse d/t recent liquid diet (had first solid foods this morning).  Pt would benefit from skilled PT to address noted impairments and functional limitations (see below for any additional details).  Upon hospital discharge, anticipate d/t generalized weakness and decreased mobility recently, pt would benefit from STR (will continue to monitor pt's status and update recommendations as appropriate).    Follow Up Recommendations SNF (pending progress)    Equipment Recommendations  Other (comment);3in1 (PT) (pt has RW at home)    Recommendations for Other Services OT consult     Precautions / Restrictions Precautions Precautions: Fall Restrictions Weight Bearing Restrictions: No      Mobility  Bed Mobility Overal bed mobility: Needs Assistance Bed Mobility: Rolling Rolling: Min  assist;Mod assist         General bed mobility comments: assist logrolling to L (use of UE's on bed rail) x2 separate trials with vc's and tactile cues for technique  Transfers                 General transfer comment: Deferred d/t diarrhea with logrolling in bed x2 separate trials  Ambulation/Gait             General Gait Details: Deferred d/t diarrhea with logrolling in bed x2 separate trials  Stairs            Wheelchair Mobility    Modified Rankin (Stroke Patients Only)       Balance                                             Pertinent Vitals/Pain Pain Assessment: No/denies pain    Home Living Family/patient expects to be discharged to:: Private residence Living Arrangements: Alone Available Help at Discharge: Family Type of Home: House Home Access: Stairs to enter Entrance Stairs-Rails: Psychiatric nurse of Steps: 1 (3 inch) step up Home Layout: Able to live on main level with bedroom/bathroom (2nd floor bonus room upstairs (does not need to access)) Home Equipment: Walker - 2 wheels;Cane - single point;Shower seat - built in;Grab bars - tub/shower;Grab bars - toilet 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    Prior Function Level of Independence: Needs assistance   Gait / Transfers Assistance Needed: Ambulates with SPC  ADL's / Homemaking Assistance Needed: 3x's  per week pt's cousin comes to assist as needed (with groceries, MD appts, etc)  Comments: No falls in past 5 years     Hand Dominance        Extremity/Trunk Assessment   Upper Extremity Assessment Upper Extremity Assessment: Generalized weakness    Lower Extremity Assessment Lower Extremity Assessment: RLE deficits/detail;LLE deficits/detail RLE Deficits / Details: impaired ROM noted to pt's R knee flexion (pt reports h/o this and h/o RA)    Cervical / Trunk Assessment Cervical / Trunk Assessment: Normal  Communication   Communication:  HOH  Cognition Arousal/Alertness: Awake/alert Behavior During Therapy: WFL for tasks assessed/performed Overall Cognitive Status: Within Functional Limits for tasks assessed                                        General Comments   Nursing cleared pt for participation in physical therapy.  Pt agreeable to PT session.    Exercises     Assessment/Plan    PT Assessment Patient needs continued PT services  PT Problem List Decreased strength;Decreased range of motion;Decreased activity tolerance;Decreased balance;Decreased mobility;Decreased knowledge of use of DME       PT Treatment Interventions DME instruction;Gait training;Stair training;Functional mobility training;Therapeutic activities;Therapeutic exercise;Balance training;Patient/family education    PT Goals (Current goals can be found in the Care Plan section)  Acute Rehab PT Goals Patient Stated Goal: to go home PT Goal Formulation: With patient Time For Goal Achievement: 08/21/19 Potential to Achieve Goals: Fair    Frequency Min 2X/week   Barriers to discharge Decreased caregiver support      Co-evaluation               AM-PAC PT "6 Clicks" Mobility  Outcome Measure Help needed turning from your back to your side while in a flat bed without using bedrails?: A Lot Help needed moving from lying on your back to sitting on the side of a flat bed without using bedrails?: A Lot Help needed moving to and from a bed to a chair (including a wheelchair)?: A Lot Help needed standing up from a chair using your arms (e.g., wheelchair or bedside chair)?: A Lot Help needed to walk in hospital room?: A Lot Help needed climbing 3-5 steps with a railing? : A Lot 6 Click Score: 12    End of Session   Activity Tolerance: Patient tolerated treatment well Patient left: in bed;with call bell/phone within reach;with bed alarm set;Other (comment) (NT present assisting pt with clean-up d/t diarrhea) Nurse  Communication: Other (comment) (pt's diarrhea) PT Visit Diagnosis: Other abnormalities of gait and mobility (R26.89);Muscle weakness (generalized) (M62.81);Difficulty in walking, not elsewhere classified (R26.2)    Time: 6283-6629 (4765-4650) PT Time Calculation (min) (ACUTE ONLY): 27 min   Charges:   PT Evaluation $PT Eval Low Complexity: 1 Low         Kimo Bancroft, PT 08/07/19, 12:10 PM

## 2019-08-07 NOTE — NC FL2 (Signed)
[place] LEVEL OF CARE SCREENING TOOL     IDENTIFICATION  Patient Name: Albert [name] Birthdate: Mar 17, 1933 Sex: male Admission Date (Current Location): 08/03/2019  [place] and [place] Number:  Engineering geologist and Address:  [facility], [address], [place], [place] [address]      Provider Number: [id number]  Attending Physician Name and Address:  Georgette Shell, MD  Relative Name and Phone Number:       Current Level of Care: Hospital Recommended Level of Care: [place] Prior Approval Number:    Date Approved/Denied:   PASRR Number: [id number]  Discharge Plan: SNF    Current Diagnoses: Patient Active Problem List   Diagnosis Date Noted  . Acute GI bleeding 08/04/2019  . Symptomatic anemia 08/04/2019  . Coagulopathy ([employer]) 08/04/2019  . Metabolic acidosis 29/79/8921  . SIRS (systemic inflammatory response syndrome) ([place]) 08/04/2019  . UGI bleed 08/04/2019  . Dehydration 11/24/2018  . CKD (chronic kidney disease), stage III 11/24/2018  . Generalized weakness 11/22/2018  . Acute renal failure superimposed on stage 3a chronic kidney disease ([place])   . Elevated troponin   . Benign essential hypertension 06/15/2013    Orientation RESPIRATION BLADDER Height & Weight     Self, Time, Place, Situation  Normal Continent Weight: 130 lb 15.3 oz (59.4 kg) Height:  5\' 4"  (162.6 cm)  BEHAVIORAL SYMPTOMS/MOOD NEUROLOGICAL BOWEL NUTRITION STATUS      Incontinent Diet (regular diet)  AMBULATORY STATUS COMMUNICATION OF NEEDS Skin   Limited Assist Verbally Normal                       Personal Care Assistance Level of Assistance  Dressing, Feeding, Bathing Bathing Assistance: Limited assistance Feeding assistance: Independent Dressing Assistance: Limited assistance     Functional Limitations Info  Sight, Hearing, Speech Sight Info: Adequate Hearing Info: Impaired Speech Info: Adequate     SPECIAL CARE FACTORS FREQUENCY  PT (By licensed PT), OT (By licensed OT)     PT Frequency: 5x OT Frequency: 5x            Contractures Contractures Info: Not present    Additional Factors Info  Code Status, Allergies Code Status Info: Full Code Allergies Info: Penicillins           Current Medications (08/07/2019):  This is the current hospital active medication list Current Facility-Administered Medications  Medication Dose Route Frequency Provider Last Rate Last Admin  . feeding supplement (ENSURE ENLIVE) (ENSURE ENLIVE) liquid 237 mL  237 mL Oral BID BM Georgette Shell, MD   237 mL at 08/07/19 1323  . fentaNYL (SUBLIMAZE) injection 12.5-25 mcg  12.5-25 mcg Intravenous Q3H PRN Opyd, Ilene Qua, MD   12.5 mcg at 08/05/19 2335  . multivitamin with minerals tablet 1 tablet  1 tablet Oral Daily Georgette Shell, MD   1 tablet at 08/07/19 [phone]  . ondansetron (ZOFRAN) tablet 4 mg  4 mg Oral Q6H PRN Opyd, Ilene Qua, MD       Or  . ondansetron (ZOFRAN) injection 4 mg  4 mg Intravenous Q6H PRN Opyd, Ilene Qua, MD      . pantoprazole (PROTONIX) EC tablet 40 mg  40 mg Oral Daily Gerarda Gunther M, PA-C   40 mg at 08/07/19 7408  . sodium chloride flush (NS) 0.9 % injection 3 mL  3 mL Intravenous Q12H Opyd, Ilene Qua, MD   3 mL at 08/07/19 [phone]  Discharge Medications: Please see discharge summary for a list of discharge medications.  Relevant Imaging Results:  Relevant Lab Results:   Additional Information SSN: [id number]  Eileen Stanford, LCSW

## 2019-08-07 NOTE — Progress Notes (Addendum)
GI Inpatient Follow-up Note  Subjective:  Patient seen in follow-up for posthemorrhagic anemia and duodenal ulcer and esophageal ulcer. No acute events overnight. Per nursing report, he has had two episodes of diarrhea when it was attempted to turn him for physical therapy. No clots or overt signs of active hemorrhage. No tarry stool or gross hematochezia. Patient's diet is advanced and he is eating solid food for lunch. He denies nausea, vomiting, abdominal pain. He has no new complaints.   Scheduled Inpatient Medications:  . sodium chloride   Intravenous Once  . sodium chloride   Intravenous Once  . feeding supplement (ENSURE ENLIVE)  237 mL Oral BID BM  . multivitamin with minerals  1 tablet Oral Daily  . pantoprazole  40 mg Oral Daily  . sodium chloride flush  3 mL Intravenous Q12H    Continuous Inpatient Infusions:    PRN Inpatient Medications:  fentaNYL (SUBLIMAZE) injection, ondansetron **OR** ondansetron (ZOFRAN) IV  Review of Systems: Constitutional: Weight is stable.  Eyes: No changes in vision. ENT: No oral lesions, sore throat.  GI: see HPI.  Heme/Lymph: No easy bruising.  CV: No chest pain.  GU: No hematuria.  Integumentary: No rashes.  Neuro: No headaches.  Psych: No depression/anxiety.  Endocrine: No heat/cold intolerance.  Allergic/Immunologic: No urticaria.  Resp: No cough, SOB.  Musculoskeletal: No joint swelling.    Physical Examination: BP (!) 97/59   Pulse 88   Temp 98 F (36.7 C)   Resp 17   Ht 5\' 4"  (1.626 m)   Wt 59.4 kg   SpO2 97%   BMI 22.48 kg/m  Gen: NAD, alert and oriented x 4 HEENT: PEERLA, EOMI, Neck: supple, no JVD or thyromegaly Chest: CTA bilaterally, no wheezes, crackles, or other adventitious sounds CV: RRR, no m/g/c/r Abd: soft, NT, ND, +BS in all four quadrants; no HSM, guarding, ridigity, or rebound tenderness Ext: no edema, well perfused with 2+ pulses, Skin: no rash or lesions noted Lymph: no LAD  Data: Lab Results   Component Value Date   WBC 5.0 08/06/2019   HGB 7.6 (L) 08/06/2019   HCT 23.3 (L) 08/06/2019   MCV 92.8 08/06/2019   PLT 99 (L) 08/06/2019   Recent Labs  Lab 08/05/19 1121 08/05/19 1732 08/06/19 0442  HGB 7.5* 7.5* 7.6*   Lab Results  Component Value Date   NA 143 08/07/2019   K 4.0 08/07/2019   CL 109 08/07/2019   CO2 25 08/07/2019   BUN 55 (H) 08/07/2019   CREATININE 1.68 (H) 08/07/2019   Lab Results  Component Value Date   ALT 9 08/04/2019   AST 23 08/04/2019   ALKPHOS 29 (L) 08/04/2019   BILITOT 0.8 08/04/2019   Recent Labs  Lab 08/03/19 2214 08/04/19 0509 08/05/19 1121  APTT 36  --   --   INR 2.2*   < > 1.3*   < > = values in this interval not displayed.   Assessment/Plan:  86 y/o male with a PMH of chronic pain syndrome, HTN, CKD Stage IIIa admitted to the hospital for symptomatic anemia  1. Posthemorrhagic anemia - s/p EGD and colonoscopy showing esophageal ulcer and two duodenal ulcers with no stigmata of bleeding. Hemoglobin 7.6 yesterday. Not checked today. No signs of overt bleeding.  2. Hematochezia- no evidence of recurrent bleeding, hemodynamically stable  3. Acute on chronic kidney disease- slowly improving with IVF, BUN 55, Cr 1.68 today  4. Coagulopathy -unknown etiology,INR 1.3 yesterday before procedure  5.Longstanding history of daily  NSAID use  COVID-19 test: NEGATIVE  Recommendations:  - Clinically stable with no signs of overt gastrointestinal bleeding - Continue Protonix PO 40 mg daily - H pylori biopsies still pending. Follow-up on results. - Recheck CBC as it was not checked this morning - If continues to have diarrhea, advise checking c diff toxin - No plans for repeat endoscopic evaluation at present given clinically improvement and no signs of overt gastrointestinal bleeding - Following peripherally - If signs of active hemorrhage, would require tagged RBC scan - Follow-up in our GI office in 2 weeks. I will  arrange appointment.   Please call with questions or concerns.   Octavia Bruckner, PA-C [facility] [phone] (Cell)

## 2019-08-08 LAB — BASIC METABOLIC PANEL
Anion gap: 7 (ref 5–15)
BUN: 46 mg/dL — ABNORMAL HIGH (ref 8–23)
CO2: 24 mmol/L (ref 22–32)
Calcium: 7.5 mg/dL — ABNORMAL LOW (ref 8.9–10.3)
Chloride: 108 mmol/L (ref 98–111)
Creatinine, Ser: 1.46 mg/dL — ABNORMAL HIGH (ref 0.61–1.24)
GFR calc Af Amer: 50 mL/min — ABNORMAL LOW (ref >60)
GFR calc non Af Amer: 44 mL/min — ABNORMAL LOW (ref >60)
Glucose, Bld: 108 mg/dL — ABNORMAL HIGH (ref 70–99)
Potassium: 4.4 mmol/L (ref 3.5–5.1)
Sodium: 139 mmol/L (ref 135–145)

## 2019-08-08 LAB — SURGICAL PATHOLOGY

## 2019-08-08 MED ORDER — ONDANSETRON HCL 4 MG PO TABS: 4.0000 mg | ORAL_TABLET | Freq: Four times a day (QID) | ORAL | 0 refills | Status: AC | PRN

## 2019-08-08 MED ORDER — ADULT MULTIVITAMIN W/MINERALS CH: 1.0000 | ORAL_TABLET | Freq: Every day | ORAL | Status: DC

## 2019-08-08 MED ORDER — ASPIRIN EC 81 MG PO TBEC
81.0000 mg | DELAYED_RELEASE_TABLET | Freq: Every day | ORAL | 2 refills | Status: DC
Start: 2019-08-08 — End: 2019-09-12

## 2019-08-08 MED ORDER — PANTOPRAZOLE SODIUM 40 MG PO TBEC: 40.0000 mg | DELAYED_RELEASE_TABLET | Freq: Every day | ORAL | 4 refills | Status: AC

## 2019-08-08 NOTE — Progress Notes (Signed)
Physical Therapy Treatment Patient Details Name: Albert [name] MRN: [id number] DOB: [birth year] Today's Date: 08/08/2019    History of Present Illness Pt is an 86 y.o. male presenting to hospital 7/29 with weakness, nausea, and bloody diarrhea.  Pt admitted with symptomatic anemia, AKI superimposed on CKD IIIa, metabolic acidosis, coagulopathy, htn, and SIRS.  Pt s/p EGD and colonsocopy; s/p blood transfusions.  PMH includes COVID-19 (11/2018), htn, CKD, basal cell carcinoma, htn, CKD.    PT Comments    Pt resting in bed upon PT arrival; agreeable to getting OOB.  Brief donned for OOB mobility and doffed end of session.  Pt requiring max assist with bed mobility (semi-supine to/from sitting edge of bed) and max assist to stand x2 trials with RW (see details below).  Pt reporting dizziness in sitting but improved symptoms with time sitting on edge of bed (BP 113/65 prior to OOB mobility and 119/72 after sitting on edge of bed; HR 94-113 bpm during sessions activities).  Pt with limited B knee flexion ROM baseline per pt report making foot placement for standing more difficult even with therapist elevating height of bed prior to standing; significant assist required to initiate and come to full stand and to shift weight forward for optimal positioning in order to attempt to take steps.  Unable to take any steps with max assist of therapist, use of RW, and max cueing; pt then sat back down onto bed d/t fatigue/weakness and therapist assisted pt back to bed.  Pt reporting he was unable to stand or walk d/t not getting his BP medication for past 5 days (nurse notified) and that he could not take any steps d/t grips on sock not allowing his feet to slide on floor.   Educated pt on and discussed STR recommendation d/t current assist levels and weakness but pt firmly refusing STR.  Therapist attempted to discuss potential support and assist levels available for pt at home but pt appearing resistant to any  discussion of this and pt providing limited information regarding resources.  Nurse notified regarding above and nurse reported she would contact MD.   Follow Up Recommendations  SNF     Equipment Recommendations  3in1 (PT);Wheelchair (measurements PT);Wheelchair cushion (measurements PT);Hospital bed (hoyer lift)    Recommendations for Other Services OT consult     Precautions / Restrictions Precautions Precautions: Fall Restrictions Weight Bearing Restrictions: No    Mobility  Bed Mobility Overal bed mobility: Needs Assistance Bed Mobility: Supine to Sit;Sit to Supine Rolling: Min assist;Mod assist (assist logrolling L and R in bed x2 trials each (to donn/doff brief))   Supine to sit: Max assist Sit to supine: Max assist   General bed mobility comments: assist for trunk and B LE's semi-supine to/from sitting edge of bed  Transfers Overall transfer level: Needs assistance Equipment used: Rolling walker (2 wheeled) Transfers: Sit to/from Stand Sit to Stand: Max assist;From elevated surface         General transfer comment: x2 trials standing up to RW (from elevated bed height); limited B knee flexion (pt reports baseline) so feet more forward (bed height elevated to assist with standing);  max assist to initiate and come to almost full stand 1st trial (significant posterior lean noted requiring assist to maintain upright posture but limited time standing d/t weakness) and max assist to initiate and come to full stand 2nd trial with extra assist to shift pt's weight forward for optimal upright posture with RW (in order to attempt taking steps  with RW)  Ambulation/Gait Ambulation/Gait assistance: Total assist Gait Distance (Feet): 0 Feet Assistive device: Rolling walker (2 wheeled)       General Gait Details: pt unable to take any steps with RW use, max assist of therapist, and max cueing   Stairs             Wheelchair Mobility    Modified Rankin (Stroke  Patients Only)       Balance Overall balance assessment: Needs assistance Sitting-balance support: No upper extremity supported;Feet supported Sitting balance-Leahy Scale: Good Sitting balance - Comments: steady sitting reaching within BOS   Standing balance support: Bilateral upper extremity supported Standing balance-Leahy Scale: Poor Standing balance comment: posterior lean noted in standing (with UE support on RW) requiring assist for balance and positioning                            Cognition Arousal/Alertness: Awake/alert Behavior During Therapy: WFL for tasks assessed/performed Overall Cognitive Status: No family/caregiver present to determine baseline cognitive functioning                                 General Comments: Impaired situational safety awareness noted      Exercises  Bed mobility and transfer training    General Comments   Nursing cleared pt for participation in physical therapy.  Pt agreeable to PT session.      Pertinent Vitals/Pain Pain Assessment: No/denies pain    Home Living                      Prior Function            PT Goals (current goals can now be found in the care plan section) Acute Rehab PT Goals Patient Stated Goal: to go home PT Goal Formulation: With patient Time For Goal Achievement: 08/21/19 Potential to Achieve Goals: Fair Progress towards PT goals: Progressing toward goals    Frequency    Min 2X/week      PT Plan Current plan remains appropriate    Co-evaluation              AM-PAC PT "6 Clicks" Mobility   Outcome Measure  Help needed turning from your back to your side while in a flat bed without using bedrails?: A Lot Help needed moving from lying on your back to sitting on the side of a flat bed without using bedrails?: A Lot Help needed moving to and from a bed to a chair (including a wheelchair)?: Total Help needed standing up from a chair using your arms  (e.g., wheelchair or bedside chair)?: A Lot Help needed to walk in hospital room?: Total Help needed climbing 3-5 steps with a railing? : Total 6 Click Score: 9    End of Session Equipment Utilized During Treatment: Gait belt Activity Tolerance: Patient limited by fatigue Patient left: in bed;with call bell/phone within reach;with bed alarm set;Other (comment) (B heels floating via pillow support) Nurse Communication: Mobility status;Precautions;Other (comment) (pt with concern regarding BP medication) PT Visit Diagnosis: Other abnormalities of gait and mobility (R26.89);Muscle weakness (generalized) (M62.81);Difficulty in walking, not elsewhere classified (R26.2)     Time: 1517-6160 PT Time Calculation (min) (ACUTE ONLY): 48 min  Charges:  $Therapeutic Activity: 38-52 mins                     Raquel Sarna  Calyn Rubi, PT 08/08/19, 1:06 PM

## 2019-08-08 NOTE — TOC Initial Note (Signed)
Transition of Care [facility]) - Initial/Assessment Note    Patient Details  Name: Albert [name] MRN: [id number] Date of Birth: 13-Aug-1933  Transition of Care [facility]) CM/SW Contact:    Eileen Stanford, LCSW Phone Number: 08/08/2019, 10:56 AM  Clinical Narrative:     CSW spoke with pt at bedside. Pt states he does not want to go to rehab he wants to d/c home. Pt states he has plenty of help at home. Pt states he has had HH before and would like it again. Pt wants a agency that will utilize his medicare. Pt states he has a 3n1 and walker at home. Pt states he doesn't need any additional equipment. MD notified.   [facility] will service pt.               Expected Discharge Plan: [place] Barriers to Discharge: Continued Medical Work up   Patient Goals and CMS Choice Patient states their goals for this hospitalization and ongoing recovery are:: to go home   Choice offered to / list presented to : Patient  Expected Discharge Plan and Services Expected Discharge Plan: [place] In-house Referral: NA   Post Acute Care Choice: [place] arrangements for the past 2 months: Single Family Home                           HH Arranged: PT, OT HH Agency: [place] ([place]) Date HH Agency Contacted: 08/08/19 Time HH Agency Contacted: 28 Representative spoke with at [place]  Prior Living Arrangements/Services Living arrangements for the past 2 months: [place] with:: Self Patient language and need for interpreter reviewed:: Yes Do you feel safe going back to the place where you live?: Yes      Need for Family Participation in Patient Care: Yes (Comment) Care giver support system in place?: Yes (comment)   Criminal Activity/Legal Involvement Pertinent to Current Situation/Hospitalization: No - Comment as needed  Activities of Daily Living Home Assistive Devices/Equipment: Walker (specify type) ADL Screening  (condition at time of admission) Patient's cognitive ability adequate to safely complete daily activities?: Yes Is the patient deaf or have difficulty hearing?: No Does the patient have difficulty seeing, even when wearing glasses/contacts?: No Does the patient have difficulty concentrating, remembering, or making decisions?: No Patient able to express need for assistance with ADLs?: No Does the patient have difficulty dressing or bathing?: No Independently performs ADLs?: Yes (appropriate for developmental age) Does the patient have difficulty walking or climbing stairs?: No Weakness of Legs: None Weakness of Arms/Hands: None  Permission Sought/Granted Permission sought to share information with : Family Supports    Share Information with NAME: Radiation protection practitioner granted to share info w AGENCY: [place] granted to share info w Relationship: relative     Emotional Assessment Appearance:: Appears stated age Attitude/Demeanor/Rapport: Engaged   Orientation: : Oriented to Situation, Oriented to  Time, Oriented to Place, Oriented to Self Alcohol / Substance Use: Not Applicable Psych Involvement: No (comment)  Admission diagnosis:  Acute GI bleeding [K92.2] UGI bleed [K92.2] Generalized weakness [R53.1] Gastrointestinal hemorrhage, unspecified gastrointestinal hemorrhage type [K92.2] Acute renal failure superimposed on stage 3a chronic kidney disease ([place]) [N17.9, N18.31] Patient Active Problem List   Diagnosis Date Noted  . Acute GI bleeding 08/04/2019  . Symptomatic anemia 08/04/2019  . Coagulopathy ([place]) 08/04/2019  . Metabolic acidosis 02/77/4128  . SIRS (  systemic inflammatory response syndrome) ([place]) 08/04/2019  . UGI bleed 08/04/2019  . Dehydration 11/24/2018  . CKD (chronic kidney disease), stage III 11/24/2018  . Generalized weakness 11/22/2018  . Acute renal failure superimposed on stage 3a chronic kidney disease ([place])   . Elevated troponin    . Benign essential hypertension 06/15/2013   PCP:  [name], MD Pharmacy:   [facility] [place], [place] [address] [place] [address] Phone: [phone] Fax: [phone]     Social Determinants of Health (SDOH) Interventions    Readmission Risk Interventions No flowsheet data found.

## 2019-08-08 NOTE — TOC Transition Note (Signed)
Transition of Care [facility]) - CM/SW Discharge Note   Patient Details  Name: Albert [name] MRN: [id number] Date of Birth: 16-Nov-1933  Transition [facility] Care [facility]) CM/SW Contact:  Eileen Stanford, LCSW Phone Number: 08/08/2019, 2:45 PM   Clinical Narrative:   Pt's HH arranged through [facility]. Wheelchair ordered through [facility], will be delivered to home.    Final next level of care: Home w Home Health Services Barriers to Discharge: No Barriers Identified   Patient Goals and CMS Choice Patient states their goals for this hospitalization and ongoing recovery are:: to go home   Choice offered to / list presented to : Patient  Discharge Placement                  Name of family member notified: cousin Patient and family notified of of transfer: 08/08/19  Discharge Plan and Services In-house Referral: NA   Post Acute Care Choice: Home Health          DME Arranged: Wheelchair manual DME Agency: [facility] Date DME Agency Contacted: 08/08/19 Time DME Agency Contacted: [id number] Representative spoke with at DME Agency: [facility] HH Arranged: PT, Nurse's Aide, Social Work [employer] Agency: [place] ([place]) Date [place]: 08/08/19 Time [place]: 1445 Representative spoke with at [place]: [place] ([place]) Interventions     Readmission Risk Interventions No flowsheet data found.

## 2019-08-08 NOTE — Discharge Summary (Signed)
Physician Discharge Summary  Albert [name]:[id number] DOB: [birth year] DOA: 08/03/2019  PCP: [name], MD  Admit date: 08/03/2019 Discharge date: 08/08/2019  Admitted From: Home [facility] Home Recommendations for Outpatient Follow-up:  1. Follow up with PCP in 1-2 weeks 2. Please obtain BMP/CBC in one week Please follow up  With [place]: PT social worker and aide Equipment/Devices wheelchair with cushion Discharge Condition stable and improved  CODE STATUS: Full code  diet recommendation: Cardiac Brief/Interim Summary:Albert E Tayloris a 86 y.o.malewith medical history significant forhypertension, chronic kidney disease stage IIIa, and chronic pain, now presenting to the emergency department with generalized weakness and GI bleeding. Patient reports that he began to feel weak in general approximately 1 week ago, progressed to the point where he had difficulty even getting out of bed today. Yesterday, he developed some vague abdominal discomfort with nausea, dry heaving, and diarrhea. Patient denied seeing any blood in his stool but EMS reports finding him in a pool of maroon blood. Patient denies any alcohol use or history of liver disease. He takes aspirin and Celebrex daily, and does not take a PPI or H2 blocker. He denies any chest pain, fevers, chills, cough, shortness of breath, or leg swelling or tenderness.  ED Course:Upon arrival to the ED, patient is found to be afebrile, saturating well on room air, mildly tachycardic, and with systolic blood pressure in the 90s. EKG features sinus tachycardia with rate 110, PVCs, and T wave inversions. Chest x-ray is negative for acute cardiopulmonary disease. Chemistry panel concerning for bicarbonate of 16, BUN 121, and creatinine 2.63, up from 1.3 in April. CBC is notable for a chronic leukocytosis and a new anemia with hemoglobin 6.0, down from 12.1 in April. INR is elevated to 2.2. Gastroenterology was consulted by  the ED physician and the patient was given a liter of lactated Ringer's, IV vitamin K, IV Protonix, and 1 unit of red blood cells.  Discharge Diagnoses:  Principal Problem:   Acute GI bleeding Active Problems:   Acute renal failure superimposed on stage 3a chronic kidney disease (HCC)   Benign essential hypertension   Symptomatic anemia   Coagulopathy (HCC)   Metabolic acidosis   SIRS (systemic inflammatory response syndrome) (HCC)   UGI bleed  #1 acute GI bleeding-status post EGD 08/05/2019-esophageal ulcer with no stigmata of recent bleeding with reflux esophagitis, 2 cm hiatal hernia and erythematous mucosa in the antrum biopsied.  Nonbleeding duodenal ulcers. Colonoscopy 08/05/2019-poor colonic preparation.  Diverticulosis and blood in the rectum and sigmoid colon with stool.  No biopsy done. She tolerated a regular diet prior to discharge.  His hemoglobin remained stable.  He did not have any further bleed prior to discharge.  He was seen by physical therapy who recommended skilled nursing facility.  Patient adamantly refused going to SNF in spite of repeated conversation by me, Education officer, museum, case manager, and physical therapist.  #2 AKI on CKD stage IIIb -due to consumption of NSAIDs and GI bleed.  His baseline creatinine is around 1.3 at the time of admission his creatinine is 2.63.  BUN elevated at 108 consistent with GI bleed.  On the day of discharge his BUN was 46 and creatinine was 1.46.  #3 essential hypertension-blood pressure is  107/62 on discharge.  I stopped antihypertensives he was taking at home on discharge.    Nutrition Problem: Increased nutrient needs Etiology: acute illness (GIB)    Signs/Symptoms: estimated needs     Interventions: Boost Breeze, MVI  Estimated  body mass index is 23.55 kg/m as calculated from the following:   Height as of this encounter: 5\' 4"  (1.626 m).   Weight as of this encounter: 62.2 kg.  Discharge Instructions  Discharge  Instructions    Call MD for:  difficulty breathing, headache or visual disturbances   Complete by: As directed    Call MD for:  persistant nausea and vomiting   Complete by: As directed    Call MD for:  redness, tenderness, or signs of infection (pain, swelling, redness, odor or green/yellow discharge around incision site)   Complete by: As directed    Diet - low sodium heart healthy   Complete by: As directed    Diet - low sodium heart healthy   Complete by: As directed    Diet - low sodium heart healthy   Complete by: As directed    Increase activity slowly   Complete by: As directed    Increase activity slowly   Complete by: As directed    Increase activity slowly   Complete by: As directed      Allergies as of 08/08/2019      Reactions   Penicillins    POA reports penicillin as allergy but unsure of reaction      Medication List    STOP taking these medications   hydrochlorothiazide 25 MG tablet Commonly known as: HYDRODIURIL     TAKE these medications   aspirin EC 81 MG tablet Take 1 tablet (81 mg total) by mouth daily. Swallow whole.start 08/19/19 What changed: additional instructions   atorvastatin 20 MG tablet Commonly known as: LIPITOR Take 20 mg by mouth daily.   celecoxib 200 MG capsule Commonly known as: CELEBREX Take 200 mg by mouth daily.   multivitamin with minerals Tabs tablet Take 1 tablet by mouth daily. Start taking on: August 09, 2019   ondansetron 4 MG tablet Commonly known as: ZOFRAN Take 1 tablet (4 mg total) by mouth every 6 (six) hours as needed for nausea.   pantoprazole 40 MG tablet Commonly known as: PROTONIX Take 1 tablet (40 mg total) by mouth daily. Start taking on: August 09, 2019            Durable Medical Equipment  (From admission, onward)         Start     Ordered   08/08/19 1406  DME standard manual wheelchair with seat cushion  Once       Comments: Patient suffers from unsteady gait and pain which impairs their  ability to perform daily activities like combing brushing in the home.  A walker will not resolve issue with performing activities of daily living. A wheelchair will allow patient to safely perform daily activities. Patient can safely propel the wheelchair in the home or has a caregiver who can provide assistance. Length of need 999 days Accessories: elevating leg rests (ELRs), wheel locks, extensions and anti-tippers.   08/08/19 1408          Follow-up Information    [name], MD Follow up.   Specialty: Family Medicine Contact information: [address] [place] [address] [phone]        Geanie Kenning, PA-C Follow up.   Specialty: Gastroenterology Contact information: [place] [address] [phone]              Allergies  Allergen Reactions  . Penicillins     POA reports penicillin as allergy but unsure of reaction  Consultations:  gi   Procedures/Studies: US RENAL  Result Date: 08/04/2019 CLINICAL DATA:  Acute on chronic renal failure EXAM: RENAL / URINARY TRACT ULTRASOUND COMPLETE COMPARISON:  None. FINDINGS: Right Kidney: Renal measurements: 8.6 x 4.0 x 3.7 cm = volume: 67 mL. Mildly increased echogenicity seen throughout. A 1.4 cm shadowing calculus seen within the midpole. No hydronephrosis. Left Kidney: Renal measurements: 8.9 x 3.8 x 3.6 cm. = volume: 63 mL. Mildly increased echogenicity seen throughout. No mass or hydronephrosis visualized. Bladder: Appears normal for degree of bladder distention. Other: Mildly enlarged prostate gland is seen. IMPRESSION: Nonobstructing right renal calculus. Diffusely increased parenchymal echogenicity, consistent with medical renal disease. Electronically Signed   By: Prudencio Pair M.D.   On: 08/04/2019 01:33   DG Chest Portable 1 View  Result Date: 08/03/2019 CLINICAL DATA:  Weakness EXAM: PORTABLE CHEST 1 VIEW COMPARISON:  11/22/2018 FINDINGS: No focal opacity or pleural  effusion. Probable scarring in the left upper lung. Normal cardiomediastinal silhouette with aortic atherosclerosis. No pneumothorax. IMPRESSION: No active disease. Electronically Signed   By: Donavan Foil M.D.   On: 08/03/2019 23:20   ECHOCARDIOGRAM COMPLETE  Result Date: 08/06/2019    ECHOCARDIOGRAM REPORT   Patient Name:   Albert [name] Date of Exam: 08/06/2019 Medical Rec #:  [id number]       Height:       64.0 in Accession #:    [id number]      Weight:       132.3 lb Date of Birth:  1933-10-24        BSA:          1.641 m Patient Age:    51 years        BP:           101/65 mmHg Patient Gender: M               HR:           77 bpm. Exam Location:  [facility] Procedure: 2D Echo Indications:     ABNORMAL ECG 794.31/R94.31  History:         Patient has no prior history of Echocardiogram examinations.                  Risk Factors:Hypertension. CKD.  Sonographer:     Avanell Shackleton Referring Phys:  [id number] [place] Diagnosing Phys: Kirk Ruths MD  Sonographer Comments: Technically difficult study due to poor echo windows. Image acquisition challenging due to uncooperative patient. TDS. IT WAS DIFFICULT TO GET ADEQUATE ASSESSMENT OF THE AOV. PT HAS LIMITED MOBILITY DUE RA. IMPRESSIONS  1. Left ventricular ejection fraction, by estimation, is 60 to 65%. The left ventricle has normal function. The left ventricle has no regional wall motion abnormalities. There is mild left ventricular hypertrophy. Left ventricular diastolic parameters are consistent with Grade I diastolic dysfunction (impaired relaxation).  2. Right ventricular systolic function is normal. The right ventricular size is moderately enlarged.  3. Left atrial size was severely dilated.  4. Right atrial size was mildly dilated.  5. The mitral valve is normal in structure. Mild mitral valve regurgitation. No evidence of mitral stenosis.  6. The aortic valve has an indeterminant number of cusps. Aortic valve regurgitation is mild. Mild to moderate  aortic valve stenosis. FINDINGS  Left Ventricle: Left ventricular ejection fraction, by estimation, is 60 to 65%. The left ventricle has normal function. The left ventricle has no regional wall motion abnormalities. The left ventricular internal cavity  size was normal in size. There is  mild left ventricular hypertrophy. Left ventricular diastolic parameters are consistent with Grade I diastolic dysfunction (impaired relaxation). Right Ventricle: The right ventricular size is moderately enlarged. Right ventricular systolic function is normal. Left Atrium: Left atrial size was severely dilated. Right Atrium: Right atrial size was mildly dilated. Pericardium: There is no evidence of pericardial effusion. Mitral Valve: The mitral valve is normal in structure. Normal mobility of the mitral valve leaflets. Mild mitral annular calcification. Mild mitral valve regurgitation. No evidence of mitral valve stenosis. Tricuspid Valve: The tricuspid valve is normal in structure. Tricuspid valve regurgitation is mild . No evidence of tricuspid stenosis. Aortic Valve: The aortic valve has an indeterminant number of cusps. Aortic valve regurgitation is mild. Aortic regurgitation PHT measures 398 msec. Mild to moderate aortic stenosis is present. Aortic valve mean gradient measures 18.0 mmHg. Aortic valve peak gradient measures 17.3 mmHg. Aortic valve area, by VTI measures 1.48 cm. Pulmonic Valve: The pulmonic valve was not well visualized. Pulmonic valve regurgitation is trivial. No evidence of pulmonic stenosis. Aorta: The aortic root is normal in size and structure. Venous: The inferior vena cava was not well visualized.  LEFT VENTRICLE PLAX 2D LVIDd:         5.16 cm  Diastology LVIDs:         3.68 cm  LV e' lateral:   8.38 cm/s LV PW:         1.20 cm  LV E/e' lateral: 10.0 LV IVS:        1.21 cm  LV e' medial:    7.07 cm/s LVOT diam:     1.60 cm  LV E/e' medial:  11.8 LV SV:         63 LV SV Index:   39 LVOT Area:     2.01 cm   LEFT ATRIUM             Index       RIGHT ATRIUM           Index LA diam:        4.80 cm 2.92 cm/m  RA Area:     18.20 cm LA Vol (A2C):   94.5 ml 57.58 ml/m RA Volume:   53.40 ml  32.54 ml/m LA Vol (A4C):   65.0 ml 39.61 ml/m LA Biplane Vol: 86.6 ml 52.77 ml/m  AORTIC VALVE AV Area (Vmax):    1.47 cm AV Area (Vmean):   1.37 cm AV Area (VTI):     1.48 cm AV Vmax:           208.00 cm/s AV Vmean:          156.000 cm/s AV VTI:            0.429 m AV Peak Grad:      17.3 mmHg AV Mean Grad:      18.0 mmHg LVOT Vmax:         152.00 cm/s LVOT Vmean:        106.000 cm/s LVOT VTI:          0.315 m LVOT/AV VTI ratio: 0.73 AI PHT:            398 msec  AORTA Ao Root diam: 3.60 cm MITRAL VALVE                TRICUSPID VALVE MV Area (PHT): 2.47 cm     TR Peak grad:   33.4 mmHg MV Decel Time: 307 msec  TR Vmax:        289.00 cm/s MR Peak grad: 132.4 mmHg MR Mean grad: 89.3 mmHg     SHUNTS MR Vmax:      575.33 cm/s   Systemic VTI:  0.32 m MR Vmean:     446.3 cm/s    Systemic Diam: 1.60 cm MV E velocity: 83.70 cm/s MV A velocity: 103.00 cm/s MV E/A ratio:  0.81 Kirk Ruths MD Electronically signed by Kirk Ruths MD Signature Date/Time: 08/06/2019/1:17:00 PM    Final     (Echo, Carotid, EGD, Colonoscopy, ERCP)    Subjective:  Refuses SNF .Marland Kitchen Discharge Exam: Vitals:   08/08/19 1123 08/08/19 1124  BP:  107/62  Pulse: 89 93  Resp: 12 17  Temp:  98.2 F (36.8 C)  SpO2: 95% 98%   Vitals:   08/08/19 1121 08/08/19 1122 08/08/19 1123 08/08/19 1124  BP:    107/62  Pulse: (!) 101 96 89 93  Resp: (!) 23 15 12 17   Temp:    98.2 F (36.8 C)  TempSrc:    Oral  SpO2: 93% 94% 95% 98%  Weight:      Height:        General: Pt is alert, awake, not in acute distress Cardiovascular: RRR, S1/S2 +, no rubs, no gallops Respiratory: CTA bilaterally, no wheezing, no rhonchi Abdominal: Soft, NT, ND, bowel sounds + Extremities: no edema, no cyanosis    The results of significant diagnostics from this  hospitalization (including imaging, microbiology, ancillary and laboratory) are listed below for reference.     Microbiology: Recent Results (from the past 240 hour(s))  SARS Coronavirus 2 by RT PCR (hospital order, performed in [facility] hospital lab) Nasopharyngeal Nasopharyngeal Swab     Status: None   Collection Time: 08/03/19 10:53 PM   Specimen: Nasopharyngeal Swab  Result Value Ref Range Status   SARS Coronavirus 2 NEGATIVE NEGATIVE Final    Comment: (NOTE) SARS-CoV-2 target nucleic acids are NOT DETECTED.  The SARS-CoV-2 RNA is generally detectable in upper and lower respiratory specimens during the acute phase of infection. The lowest concentration of SARS-CoV-2 viral copies this assay can detect is 250 copies / mL. A negative result does not preclude SARS-CoV-2 infection and should not be used as the sole basis for treatment or other patient management decisions.  A negative result may occur with improper specimen collection / handling, submission of specimen other than nasopharyngeal swab, presence of viral mutation(s) within the areas targeted by this assay, and inadequate number of viral copies (<250 copies / mL). A negative result must be combined with clinical observations, patient history, and epidemiological information.  Fact Sheet for Patients:   StrictlyIdeas.no  Fact Sheet for Healthcare Providers: BankingDealers.co.za  This test is not yet approved or  cleared by the [place] FDA and has been authorized for detection and/or diagnosis of SARS-CoV-2 by FDA under an Emergency Use Authorization (EUA).  This EUA will remain in effect (meaning this test can be used) for the duration of the COVID-19 declaration under Section 564(b)(1) of the Act, 21 U.S.C. section 360bbb-3(b)(1), unless the authorization is terminated or revoked sooner.  Performed at [facility], [address] [place], [place]  [address]      Labs: BNP (last 3 results) No results for input(s): BNP in the last 8760 hours. Basic Metabolic Panel: Recent Labs  Lab 08/03/19 2214 08/04/19 0631 08/04/19 1640 08/05/19 0407 08/06/19 0442 08/07/19 0443 08/08/19 0448  NA   < >  --  141 137 143 143 139  K   < >  --  4.2 3.7 3.3* 4.0 4.4  CL   < >  --  101 100 110 109 108  CO2   < >  --  24 24 23 25 24   GLUCOSE   < >  --  106* 93 100* 95 108*  BUN   < >  --  120* 108* 82* 55* 46*  CREATININE   < >  --  2.77* 2.58* 1.99* 1.68* 1.46*  CALCIUM   < >  --  7.3* 7.2* 7.3* 7.2* 7.5*  MG  --  1.2*  --   --   --   --   --    < > = values in this interval not displayed.   Liver Function Tests: Recent Labs  Lab 08/03/19 2214 08/04/19 0631  AST 42* 23  ALT 13 9  ALKPHOS 48 29*  BILITOT 0.9 0.8  PROT 4.7* <3.0*  ALBUMIN 2.4* 1.6*   No results for input(s): LIPASE, AMYLASE in the last 168 hours. No results for input(s): AMMONIA in the last 168 hours. CBC: Recent Labs  Lab 08/03/19 2214 08/04/19 0509 08/04/19 1640 08/04/19 1640 08/05/19 0407 08/05/19 1121 08/05/19 1732 08/06/19 0442 08/07/19 1418  WBC 16.2*   < > 10.5  --  6.3  --  6.1 5.0 7.2  NEUTROABS 12.9*  --   --   --   --   --   --   --   --   HGB 6.0*   < > 7.1*   < > 6.5* 7.5* 7.5* 7.6* 8.3*  HCT 18.2*   < > 19.7*   < > 19.8* 21.8* 23.1* 23.3* 24.9*  MCV 93.8   < > 86.4  --  90.0  --  92.4 92.8 91.2  PLT 211   < > 122*  --  116*  --  101* 99* 119*   < > = values in this interval not displayed.   Cardiac Enzymes: Recent Labs  Lab 08/03/19 2218  CKTOTAL 221   BNP: Invalid input(s): POCBNP CBG: Recent Labs  Lab 08/05/19 0813  GLUCAP 80   D-Dimer No results for input(s): DDIMER in the last 72 hours. Hgb A1c No results for input(s): HGBA1C in the last 72 hours. Lipid Profile No results for input(s): CHOL, HDL, LDLCALC, TRIG, CHOLHDL, LDLDIRECT in the last 72 hours. Thyroid function studies No results for input(s): TSH, T4TOTAL, T3FREE,  THYROIDAB in the last 72 hours.  Invalid input(s): FREET3 Anemia work up No results for input(s): VITAMINB12, FOLATE, FERRITIN, TIBC, IRON, RETICCTPCT in the last 72 hours. Urinalysis    Component Value Date/Time   COLORURINE YELLOW (A) 08/04/2019 0455   APPEARANCEUR HAZY (A) 08/04/2019 0455   LABSPEC 1.015 08/04/2019 0455   PHURINE 5.0 08/04/2019 0455   GLUCOSEU NEGATIVE 08/04/2019 0455   HGBUR NEGATIVE 08/04/2019 0455   BILIRUBINUR NEGATIVE 08/04/2019 0455   [place] 08/04/2019 0455   PROTEINUR NEGATIVE 08/04/2019 0455   NITRITE NEGATIVE 08/04/2019 0455   LEUKOCYTESUR TRACE (A) 08/04/2019 0455   Sepsis Labs Invalid input(s): PROCALCITONIN,  WBC,  LACTICIDVEN Microbiology Recent Results (from the past 240 hour(s))  SARS Coronavirus 2 by RT PCR (hospital order, performed in [place] hospital lab) Nasopharyngeal Nasopharyngeal Swab     Status: None   Collection Time: 08/03/19 10:53 PM   Specimen: Nasopharyngeal Swab  Result Value Ref Range Status   SARS Coronavirus 2 NEGATIVE NEGATIVE Final  Comment: (NOTE) SARS-CoV-2 target nucleic acids are NOT DETECTED.  The SARS-CoV-2 RNA is generally detectable in upper and lower respiratory specimens during the acute phase of infection. The lowest concentration of SARS-CoV-2 viral copies this assay can detect is 250 copies / mL. A negative result does not preclude SARS-CoV-2 infection and should not be used as the sole basis for treatment or other patient management decisions.  A negative result may occur with improper specimen collection / handling, submission of specimen other than nasopharyngeal swab, presence of viral mutation(s) within the areas targeted by this assay, and inadequate number of viral copies (<250 copies / mL). A negative result must be combined with clinical observations, patient history, and epidemiological information.  Fact Sheet for Patients:   StrictlyIdeas.no  Fact  Sheet for Healthcare Providers: BankingDealers.co.za  This test is not yet approved or  cleared by the [place] FDA and has been authorized for detection and/or diagnosis of SARS-CoV-2 by FDA under an Emergency Use Authorization (EUA).  This EUA will remain in effect (meaning this test can be used) for the duration of the COVID-19 declaration under Section 564(b)(1) of the Act, 21 U.S.C. section 360bbb-3(b)(1), unless the authorization is terminated or revoked sooner.  Performed at [facility], [address] [place], [place] [address]      Time coordinating discharge:  39 minutes  SIGNED:   Georgette Shell, MD  [facility] 08/08/2019, 2:10 PM Pager   If 7PM-7AM, please contact night-coverage [email or web address] Password [email or web address]

## 2019-08-08 NOTE — Progress Notes (Signed)
Patient refusing SNF. Insist to go home. Albert [name] is POA and agreed to come get him from hospital once home health was in place to come out and help him. Spoke to Albert [name] and he was on way to pick Albert [name] up. Once patient down in medical mall, family called (Unidentified person) called and said they would pick up in 45 minutes. Miscommunication throughout the day with family members is appears. Patient was returned to room and awaiting pick  Up at this time.

## 2019-08-08 NOTE — Clinical Social Work Note (Cosign Needed)
Patient suffers from symptomatic anemia which impairs their ability to perform daily activities like walking, baithing, dressing in the home.  A walker will not resolve issue with performing activities of daily living. A wheelchair will allow patient to safely perform daily activities. Patient is not able to propel themselves in the home using a standard weight wheelchair due to weakness. Patient can self propel in the lightweight wheelchair.  Albert [name], [place]

## 2019-08-08 NOTE — TOC Progression Note (Addendum)
Transition of Care [facility]) - Progression Note    Patient Details  Name: Albert [name] MRN: [id number] Date of Birth: [birth year]  [facility] CM/SW Contact  Eileen Stanford, LCSW Phone Number: 08/08/2019, 2:08 PM  Clinical Narrative: PT and MD ask that CSW speak with pt again to assure he won't go to rehab. Pt states adamantly he is not going to rehab. RN was also at bedside during this interaction. Pt also states he does not want a hospital bed .   Expected Discharge Plan: [place] Barriers to Discharge: Continued Medical Work up  Expected Discharge Plan and Services Expected Discharge Plan: [place] In-house Referral: NA   Post Acute Care Choice: [place] arrangements for the past 2 months: Single Family Home Expected Discharge Date: 08/08/19                         HH Arranged: PT, OT HH Agency: [place] ([facility]) Date [place]: 08/08/19 Time [place]: 1056 Representative spoke with at [place]: [place] ([place]) Interventions    Readmission Risk Interventions No flowsheet data found.

## 2019-08-09 ENCOUNTER — Inpatient Hospital Stay
Admission: EM | Admit: 2019-08-09 | Discharge: 2019-08-14 | DRG: 871 | Disposition: A | Discharge status: Skilled Nursing Facility | Payer: Medicare Other | Attending: Internal Medicine | Admitting: Internal Medicine

## 2019-08-09 ENCOUNTER — Emergency Department: Payer: Medicare Other

## 2019-08-09 ENCOUNTER — Other Ambulatory Visit: Payer: Self-pay

## 2019-08-09 DIAGNOSIS — Z79899 Other long term (current) drug therapy: Secondary | ICD-10-CM

## 2019-08-09 DIAGNOSIS — K2211 Ulcer of esophagus with bleeding: Secondary | ICD-10-CM | POA: Diagnosis present

## 2019-08-09 DIAGNOSIS — E872 Acidosis, unspecified: Secondary | ICD-10-CM

## 2019-08-09 DIAGNOSIS — K829 Disease of gallbladder, unspecified: Secondary | ICD-10-CM

## 2019-08-09 DIAGNOSIS — I129 Hypertensive chronic kidney disease with stage 1 through stage 4 chronic kidney disease, or unspecified chronic kidney disease: Secondary | ICD-10-CM | POA: Diagnosis present

## 2019-08-09 DIAGNOSIS — Z6823 Body mass index (BMI) 23.0-23.9, adult: Secondary | ICD-10-CM

## 2019-08-09 DIAGNOSIS — R778 Other specified abnormalities of plasma proteins: Secondary | ICD-10-CM

## 2019-08-09 DIAGNOSIS — Z20822 Contact with and (suspected) exposure to covid-19: Secondary | ICD-10-CM | POA: Diagnosis present

## 2019-08-09 DIAGNOSIS — K654 Sclerosing mesenteritis: Secondary | ICD-10-CM | POA: Diagnosis present

## 2019-08-09 DIAGNOSIS — Z7982 Long term (current) use of aspirin: Secondary | ICD-10-CM

## 2019-08-09 DIAGNOSIS — I959 Hypotension, unspecified: Secondary | ICD-10-CM | POA: Diagnosis not present

## 2019-08-09 DIAGNOSIS — R6520 Severe sepsis without septic shock: Secondary | ICD-10-CM | POA: Diagnosis not present

## 2019-08-09 DIAGNOSIS — Z8616 Personal history of COVID-19: Secondary | ICD-10-CM | POA: Diagnosis not present

## 2019-08-09 DIAGNOSIS — K264 Chronic or unspecified duodenal ulcer with hemorrhage: Secondary | ICD-10-CM | POA: Diagnosis present

## 2019-08-09 DIAGNOSIS — K921 Melena: Secondary | ICD-10-CM

## 2019-08-09 DIAGNOSIS — R6521 Severe sepsis with septic shock: Secondary | ICD-10-CM | POA: Diagnosis present

## 2019-08-09 DIAGNOSIS — Z85828 Personal history of other malignant neoplasm of skin: Secondary | ICD-10-CM | POA: Diagnosis not present

## 2019-08-09 DIAGNOSIS — K922 Gastrointestinal hemorrhage, unspecified: Secondary | ICD-10-CM | POA: Diagnosis present

## 2019-08-09 DIAGNOSIS — K5790 Diverticulosis of intestine, part unspecified, without perforation or abscess without bleeding: Secondary | ICD-10-CM | POA: Diagnosis present

## 2019-08-09 DIAGNOSIS — G8929 Other chronic pain: Secondary | ICD-10-CM | POA: Diagnosis present

## 2019-08-09 DIAGNOSIS — Z880 Allergy status to penicillin: Secondary | ICD-10-CM | POA: Diagnosis not present

## 2019-08-09 DIAGNOSIS — I4891 Unspecified atrial fibrillation: Secondary | ICD-10-CM | POA: Diagnosis present

## 2019-08-09 DIAGNOSIS — N1831 Chronic kidney disease, stage 3a: Secondary | ICD-10-CM | POA: Diagnosis present

## 2019-08-09 DIAGNOSIS — E869 Volume depletion, unspecified: Secondary | ICD-10-CM | POA: Diagnosis present

## 2019-08-09 DIAGNOSIS — A419 Sepsis, unspecified organism: Principal | ICD-10-CM | POA: Diagnosis present

## 2019-08-09 DIAGNOSIS — D62 Acute posthemorrhagic anemia: Secondary | ICD-10-CM | POA: Diagnosis present

## 2019-08-09 DIAGNOSIS — E43 Unspecified severe protein-calorie malnutrition: Secondary | ICD-10-CM | POA: Insufficient documentation

## 2019-08-09 LAB — LACTIC ACID, PLASMA
Lactic Acid, Venous: 4 mmol/L (ref 0.5–1.9)
Lactic Acid, Venous: 4.2 mmol/L (ref 0.5–1.9)
Lactic Acid, Venous: 3 mmol/L (ref 0.5–1.9)

## 2019-08-09 LAB — CBC WITH DIFFERENTIAL/PLATELET
Abs Immature Granulocytes: 0.4 10*3/uL — ABNORMAL HIGH (ref 0.00–0.07)
Abs Immature Granulocytes: 0.39 10*3/uL — ABNORMAL HIGH (ref 0.00–0.07)
Basophils Absolute: 0.1 10*3/uL (ref 0.0–0.1)
Basophils Absolute: 0.1 10*3/uL (ref 0.0–0.1)
Basophils Relative: 0 %
Basophils Relative: 0 %
Eosinophils Absolute: 0.1 10*3/uL (ref 0.0–0.5)
Eosinophils Absolute: 0.1 10*3/uL (ref 0.0–0.5)
Eosinophils Relative: 1 %
Eosinophils Relative: 0 %
HCT: 28 % — ABNORMAL LOW (ref 39.0–52.0)
HCT: 23.4 % — ABNORMAL LOW (ref 39.0–52.0)
Hemoglobin: 8.9 g/dL — ABNORMAL LOW (ref 13.0–17.0)
Hemoglobin: 7.5 g/dL — ABNORMAL LOW (ref 13.0–17.0)
Immature Granulocytes: 2 %
Immature Granulocytes: 2 %
Lymphocytes Relative: 4 %
Lymphocytes Relative: 4 %
Lymphs Abs: 0.9 10*3/uL (ref 0.7–4.0)
Lymphs Abs: 0.8 10*3/uL (ref 0.7–4.0)
MCH: 29.7 pg (ref 26.0–34.0)
MCH: 29.9 pg (ref 26.0–34.0)
MCHC: 31.8 g/dL (ref 30.0–36.0)
MCHC: 32.1 g/dL (ref 30.0–36.0)
MCV: 93.3 fL (ref 80.0–100.0)
MCV: 93.2 fL (ref 80.0–100.0)
Monocytes Absolute: 2.1 10*3/uL — ABNORMAL HIGH (ref 0.1–1.0)
Monocytes Absolute: 1.2 10*3/uL — ABNORMAL HIGH (ref 0.1–1.0)
Monocytes Relative: 8 %
Monocytes Relative: 6 %
Neutro Abs: 21.8 10*3/uL — ABNORMAL HIGH (ref 1.7–7.7)
Neutro Abs: 17 10*3/uL — ABNORMAL HIGH (ref 1.7–7.7)
Neutrophils Relative %: 85 %
Neutrophils Relative %: 88 %
Platelets: 208 10*3/uL (ref 150–400)
Platelets: 145 10*3/uL — ABNORMAL LOW (ref 150–400)
RBC: 3 MIL/uL — ABNORMAL LOW (ref 4.22–5.81)
RBC: 2.51 MIL/uL — ABNORMAL LOW (ref 4.22–5.81)
RDW: 16.7 % — ABNORMAL HIGH (ref 11.5–15.5)
RDW: 16.4 % — ABNORMAL HIGH (ref 11.5–15.5)
Smear Review: NORMAL
WBC: 25.5 10*3/uL — ABNORMAL HIGH (ref 4.0–10.5)
WBC: 19.5 10*3/uL — ABNORMAL HIGH (ref 4.0–10.5)
nRBC: 0 % (ref 0.0–0.2)
nRBC: 0 % (ref 0.0–0.2)

## 2019-08-09 LAB — CBC
HCT: 30.4 % — ABNORMAL LOW (ref 39.0–52.0)
Hemoglobin: 9.7 g/dL — ABNORMAL LOW (ref 13.0–17.0)
MCH: 29.8 pg (ref 26.0–34.0)
MCHC: 31.9 g/dL (ref 30.0–36.0)
MCV: 93.3 fL (ref 80.0–100.0)
Platelets: 219 10*3/uL (ref 150–400)
RBC: 3.26 MIL/uL — ABNORMAL LOW (ref 4.22–5.81)
RDW: 16.7 % — ABNORMAL HIGH (ref 11.5–15.5)
WBC: 28 10*3/uL — ABNORMAL HIGH (ref 4.0–10.5)
nRBC: 0 % (ref 0.0–0.2)

## 2019-08-09 LAB — COMPREHENSIVE METABOLIC PANEL
ALT: 20 U/L (ref 0–44)
ALT: 18 U/L (ref 0–44)
AST: 47 U/L — ABNORMAL HIGH (ref 15–41)
AST: 47 U/L — ABNORMAL HIGH (ref 15–41)
Albumin: 2.6 g/dL — ABNORMAL LOW (ref 3.5–5.0)
Albumin: 2.6 g/dL — ABNORMAL LOW (ref 3.5–5.0)
Alkaline Phosphatase: 90 U/L (ref 38–126)
Alkaline Phosphatase: 85 U/L (ref 38–126)
Anion gap: 12 (ref 5–15)
Anion gap: 12 (ref 5–15)
BUN: 43 mg/dL — ABNORMAL HIGH (ref 8–23)
BUN: 44 mg/dL — ABNORMAL HIGH (ref 8–23)
CO2: 20 mmol/L — ABNORMAL LOW (ref 22–32)
CO2: 21 mmol/L — ABNORMAL LOW (ref 22–32)
Calcium: 7.9 mg/dL — ABNORMAL LOW (ref 8.9–10.3)
Calcium: 7.8 mg/dL — ABNORMAL LOW (ref 8.9–10.3)
Chloride: 106 mmol/L (ref 98–111)
Chloride: 107 mmol/L (ref 98–111)
Creatinine, Ser: 1.43 mg/dL — ABNORMAL HIGH (ref 0.61–1.24)
Creatinine, Ser: 1.54 mg/dL — ABNORMAL HIGH (ref 0.61–1.24)
GFR calc Af Amer: 52 mL/min — ABNORMAL LOW (ref >60)
GFR calc Af Amer: 47 mL/min — ABNORMAL LOW (ref >60)
GFR calc non Af Amer: 45 mL/min — ABNORMAL LOW (ref >60)
GFR calc non Af Amer: 41 mL/min — ABNORMAL LOW (ref >60)
Glucose, Bld: 100 mg/dL — ABNORMAL HIGH (ref 70–99)
Glucose, Bld: 96 mg/dL (ref 70–99)
Potassium: 4.3 mmol/L (ref 3.5–5.1)
Potassium: 4.8 mmol/L (ref 3.5–5.1)
Sodium: 138 mmol/L (ref 135–145)
Sodium: 140 mmol/L (ref 135–145)
Total Bilirubin: 1.3 mg/dL — ABNORMAL HIGH (ref 0.3–1.2)
Total Bilirubin: 1.3 mg/dL — ABNORMAL HIGH (ref 0.3–1.2)
Total Protein: 5.6 g/dL — ABNORMAL LOW (ref 6.5–8.1)
Total Protein: 5.4 g/dL — ABNORMAL LOW (ref 6.5–8.1)

## 2019-08-09 LAB — TROPONIN I (HIGH SENSITIVITY)
Troponin I (High Sensitivity): 43 ng/L — ABNORMAL HIGH (ref <18)
Troponin I (High Sensitivity): 32 ng/L — ABNORMAL HIGH (ref <18)

## 2019-08-09 LAB — MRSA PCR SCREENING: MRSA by PCR: NEGATIVE

## 2019-08-09 LAB — APTT: aPTT: 32 seconds (ref 24–36)

## 2019-08-09 LAB — SARS CORONAVIRUS 2 BY RT PCR (HOSPITAL ORDER, PERFORMED IN ~~LOC~~ HOSPITAL LAB): SARS Coronavirus 2: NEGATIVE

## 2019-08-09 LAB — PROTIME-INR
INR: 1 (ref 0.8–1.2)
Prothrombin Time: 13.1 seconds (ref 11.4–15.2)

## 2019-08-09 LAB — MAGNESIUM: Magnesium: 1.5 mg/dL — ABNORMAL LOW (ref 1.7–2.4)

## 2019-08-09 LAB — PREPARE RBC (CROSSMATCH)

## 2019-08-09 LAB — FIBRINOGEN: Fibrinogen: 286 mg/dL (ref 210–475)

## 2019-08-09 LAB — PROCALCITONIN: Procalcitonin: 0.19 ng/mL

## 2019-08-09 MED ORDER — ONDANSETRON HCL 4 MG/2ML IJ SOLN: 4.0000 mg | Freq: Four times a day (QID) | INTRAMUSCULAR | Status: DC | PRN

## 2019-08-09 MED ORDER — SODIUM CHLORIDE 0.9 % IV SOLN
8.0000 mg/h | INTRAVENOUS | Status: AC
  Administered 2019-08-10 – 2019-08-12 (×6): 8 mg/h via INTRAVENOUS
  Filled 2019-08-09 (×6): qty 80

## 2019-08-09 MED ORDER — METRONIDAZOLE IN NACL 5-0.79 MG/ML-% IV SOLN
500.0000 mg | Freq: Three times a day (TID) | INTRAVENOUS | Status: DC
  Administered 2019-08-10 – 2019-08-14 (×14): 500 mg via INTRAVENOUS
  Filled 2019-08-09 (×17): qty 100

## 2019-08-09 MED ORDER — SODIUM CHLORIDE 0.9 % IV SOLN
50.0000 ug/h | INTRAVENOUS | Status: DC
  Administered 2019-08-09 – 2019-08-11 (×3): 50 ug/h via INTRAVENOUS
  Filled 2019-08-09 (×8): qty 1

## 2019-08-09 MED ORDER — LACTATED RINGERS IV SOLN: INTRAVENOUS | Status: DC

## 2019-08-09 MED ORDER — MAGNESIUM SULFATE 2 GM/50ML IV SOLN
2.0000 g | Freq: Once | INTRAVENOUS | Status: AC
  Administered 2019-08-09: 2 g via INTRAVENOUS
  Filled 2019-08-09: qty 50

## 2019-08-09 MED ORDER — VANCOMYCIN HCL 2000 MG/400ML IV SOLN: 2000.0000 mg | Freq: Once | INTRAVENOUS | Status: DC

## 2019-08-09 MED ORDER — VANCOMYCIN HCL 750 MG/150ML IV SOLN
750.0000 mg | INTRAVENOUS | Status: DC
  Administered 2019-08-10: 23:00:00 750 mg via INTRAVENOUS
  Filled 2019-08-09 (×4): qty 150

## 2019-08-09 MED ORDER — METRONIDAZOLE IN NACL 5-0.79 MG/ML-% IV SOLN
500.0000 mg | Freq: Once | INTRAVENOUS | Status: AC
  Administered 2019-08-09: 500 mg via INTRAVENOUS
  Filled 2019-08-09: qty 100

## 2019-08-09 MED ORDER — VANCOMYCIN HCL IN DEXTROSE 1-5 GM/200ML-% IV SOLN
1000.0000 mg | Freq: Once | INTRAVENOUS | Status: AC
  Administered 2019-08-09: 1000 mg via INTRAVENOUS
  Filled 2019-08-09: qty 200

## 2019-08-09 MED ORDER — SODIUM CHLORIDE 0.9% IV SOLUTION
Freq: Once | INTRAVENOUS | Status: AC
  Administered 2019-08-09: 100 mL via INTRAVENOUS
  Filled 2019-08-09: qty 250

## 2019-08-09 MED ORDER — DOCUSATE SODIUM 100 MG PO CAPS: 100.0000 mg | ORAL_CAPSULE | Freq: Two times a day (BID) | ORAL | Status: DC | PRN

## 2019-08-09 MED ORDER — SODIUM CHLORIDE 0.9 % IV SOLN
80.0000 mg | Freq: Once | INTRAVENOUS | Status: AC
  Administered 2019-08-09: 18:00:00 80 mg via INTRAVENOUS
  Filled 2019-08-09: qty 80

## 2019-08-09 MED ORDER — SODIUM CHLORIDE 0.9 % IV SOLN
2.0000 g | Freq: Once | INTRAVENOUS | Status: AC
  Administered 2019-08-09: 2 g via INTRAVENOUS
  Filled 2019-08-09: qty 2

## 2019-08-09 MED ORDER — LACTATED RINGERS IV BOLUS (SEPSIS)
500.0000 mL | Freq: Once | INTRAVENOUS | Status: AC
  Administered 2019-08-09: 500 mL via INTRAVENOUS

## 2019-08-09 MED ORDER — LACTATED RINGERS IV BOLUS
1000.0000 mL | Freq: Once | INTRAVENOUS | Status: AC
  Administered 2019-08-09: 1000 mL via INTRAVENOUS

## 2019-08-09 MED ORDER — SODIUM CHLORIDE 0.9 % IV SOLN
2.0000 g | Freq: Two times a day (BID) | INTRAVENOUS | Status: DC
  Administered 2019-08-10 – 2019-08-14 (×8): 2 g via INTRAVENOUS
  Filled 2019-08-09 (×10): qty 2

## 2019-08-09 MED ORDER — IOHEXOL 350 MG/ML SOLN
75.0000 mL | Freq: Once | INTRAVENOUS | Status: AC | PRN
  Administered 2019-08-09: 75 mL via INTRAVENOUS

## 2019-08-09 MED ORDER — VANCOMYCIN HCL 500 MG/100ML IV SOLN
500.0000 mg | Freq: Once | INTRAVENOUS | Status: AC
  Administered 2019-08-09: 500 mg via INTRAVENOUS
  Filled 2019-08-09: qty 100

## 2019-08-09 MED ORDER — PANTOPRAZOLE SODIUM 40 MG IV SOLR
40.0000 mg | Freq: Two times a day (BID) | INTRAVENOUS | Status: DC
  Administered 2019-08-13 – 2019-08-14 (×3): 40 mg via INTRAVENOUS
  Filled 2019-08-09 (×3): qty 40

## 2019-08-09 MED ORDER — POLYETHYLENE GLYCOL 3350 17 G PO PACK: 17.0000 g | PACK | Freq: Every day | ORAL | Status: DC | PRN

## 2019-08-09 NOTE — ED Provider Notes (Addendum)
[facility] Emergency Department Provider Note  ____________________________________________   First MD Initiated Contact with Patient 08/09/19 1652     (approximate)  I have reviewed the triage vital signs and the nursing notes.   HISTORY  Chief Complaint GI Bleeding   HPI Albert [name] is a 86 y.o. male with  medical history significant forhypertension, chronic kidney disease stage IIIa, and chronic pain as well as recent admission 7/29-8/3 for presumed upper GI bleed after he was found to have bleeding ulcers in his esophagus and stomach without evidence of active bleeding EGD performed on 7/31 who presents for assessment of weakness and concerns from his home health aides about possible continued bleeding in his diapers.  Patient denies any chest pain, cough, headache, earache, sore throat, fevers, domino pain, back pain, dysuria, or bright red blood in stool.  He is not sure if he has had any melenic stools.  He denies any falls or extremity pain.  No rashes.  States he has been compliant with his medications.  States he feels similar to how he presented before.  No clear alleviating or aggravating factors.         Past Medical History:  Diagnosis Date  . Actinic keratosis 04/23/2008   L elbow distal tricep (bx proven)  . Basal cell carcinoma 06/17/2006   L preauricular   . Basal cell carcinoma 05/26/2012   L ant neck   . Basal cell carcinoma 11/02/2012   Glabella - excision   . Basal cell carcinoma 08/15/2014   L med ankle   . Basal cell carcinoma 02/23/2017   R lat infrapectoral - excision  . Basal cell carcinoma 12/08/2017   L mid to distal lat volar forearm   . Hypertension   . Squamous cell carcinoma of skin 02/14/2014   R lat low back - ED&C  . Squamous cell carcinoma of skin 10/02/2014   R prox lat bicep - ED&C    Patient Active Problem List   Diagnosis Date Noted  . Acute GI bleeding 08/04/2019  . Symptomatic anemia 08/04/2019   . Coagulopathy ([place]) 08/04/2019  . Metabolic acidosis 66/06/3014  . SIRS (systemic inflammatory response syndrome) ([place]) 08/04/2019  . UGI bleed 08/04/2019  . Dehydration 11/24/2018  . CKD (chronic kidney disease), stage III 11/24/2018  . Generalized weakness 11/22/2018  . Acute renal failure superimposed on stage 3a chronic kidney disease ([place])   . Elevated troponin   . Benign essential hypertension 06/15/2013    Past Surgical History:  Procedure Laterality Date  . COLONOSCOPY N/A 08/05/2019   Procedure: COLONOSCOPY;  Surgeon: Toledo, Benay Pike, MD;  Location: [facility] ENDOSCOPY;  Service: Gastroenterology;  Laterality: N/A;  . ESOPHAGOGASTRODUODENOSCOPY N/A 08/05/2019   Procedure: ESOPHAGOGASTRODUODENOSCOPY (EGD);  Surgeon: Toledo, Benay Pike, MD;  Location: [facility] ENDOSCOPY;  Service: Gastroenterology;  Laterality: N/A;    Prior to Admission medications   Medication Sig Start Date End Date Taking? Authorizing Provider  aspirin EC 81 MG tablet Take 1 tablet (81 mg total) by mouth daily. Swallow whole.start 08/19/19 08/08/19 08/07/20  Georgette Shell, MD  atorvastatin (LIPITOR) 20 MG tablet Take 20 mg by mouth daily. 11/20/18   [provider]  Multiple Vitamin (MULTIVITAMIN WITH MINERALS) TABS tablet Take 1 tablet by mouth daily. 08/09/19   Georgette Shell, MD  ondansetron (ZOFRAN) 4 MG tablet Take 1 tablet (4 mg total) by mouth every 6 (six) hours as needed for nausea. 08/08/19   Georgette Shell, MD  pantoprazole (  PROTONIX) 40 MG tablet Take 1 tablet (40 mg total) by mouth daily. 08/09/19   Georgette Shell, MD    Allergies Penicillins  History reviewed. No pertinent family history.  Social History Social History   Tobacco Use  . Smoking status: Never Smoker  . Smokeless tobacco: Never Used  Vaping Use  . Vaping Use: Never used  Substance Use Topics  . Alcohol use: Not Currently  . Drug use: Never    Review of Systems  Review of Systems  Constitutional:  Positive for malaise/fatigue and weight loss. Negative for chills and fever.  HENT: Negative for sore throat.   Eyes: Negative for pain.  Respiratory: Negative for cough and stridor.   Cardiovascular: Positive for palpitations. Negative for chest pain.  Gastrointestinal: Positive for nausea. Negative for vomiting.  Skin: Negative for rash.  Neurological: Negative for seizures, loss of consciousness and headaches.  Psychiatric/Behavioral: Negative for suicidal ideas.  All other systems reviewed and are negative.     ____________________________________________   PHYSICAL EXAM:  VITAL SIGNS: ED Triage Vitals [08/09/19 1616]  Enc Vitals Group     BP (!) 147/110     Pulse Rate (!) 128     Resp 20     Temp 98 F (36.7 C)     Temp Source Oral     SpO2 97 %     Weight 136 lb 11 oz (62 kg)     Height 5\' 4"  (1.626 m)     Head Circumference      Peak Flow      Pain Score 0     Pain Loc      Pain Edu?      Excl. in [place]?    Vitals:   08/09/19 1901 08/09/19 1912  BP: (!) 92/51 90/60  Pulse: (!) 106 (!) 105  Resp: 14 16  Temp:    SpO2: 100% 100%   Physical Exam Vitals and nursing note reviewed. Exam conducted with a chaperone present.  Constitutional:      General: He is in acute distress.     Appearance: He is well-developed. He is ill-appearing.  HENT:     Head: Normocephalic and atraumatic.     Right Ear: External ear normal.     Left Ear: External ear normal.     Nose: Nose normal.     Mouth/Throat:     Mouth: Mucous membranes are dry.  Eyes:     Conjunctiva/sclera: Conjunctivae normal.  Cardiovascular:     Rate and Rhythm: Regular rhythm. Tachycardia present.     Heart sounds: No murmur heard.   Pulmonary:     Effort: Pulmonary effort is normal. No respiratory distress.     Breath sounds: Normal breath sounds.  Abdominal:     Palpations: Abdomen is soft.     Tenderness: There is no abdominal tenderness.  Musculoskeletal:     Cervical back: Neck supple.      Right lower leg: No edema.     Left lower leg: No edema.  Skin:    General: Skin is warm and dry.     Capillary Refill: Capillary refill takes 2 to 3 seconds.  Neurological:     Mental Status: He is alert and oriented to person, place, and time.  Psychiatric:        Mood and Affect: Mood normal.     Patient has small amount of melanotic stool in his diaper.  Hemoccult is positive. ____________________________________________   LABS (all labs ordered  are listed, but only abnormal results are displayed)  Labs Reviewed  COMPREHENSIVE METABOLIC PANEL - Abnormal; Notable for the following components:      Result Value   CO2 20 (*)    Glucose, Bld 100 (*)    BUN 43 (*)    Creatinine, Ser 1.43 (*)    Calcium 7.9 (*)    Total Protein 5.6 (*)    Albumin 2.6 (*)    AST 47 (*)    Total Bilirubin 1.3 (*)    GFR calc non Af Amer 45 (*)    GFR calc Af Amer 52 (*)    All other components within normal limits  CBC - Abnormal; Notable for the following components:   WBC 28.0 (*)    RBC 3.26 (*)    Hemoglobin 9.7 (*)    HCT 30.4 (*)    RDW 16.7 (*)    All other components within normal limits  MAGNESIUM - Abnormal; Notable for the following components:   Magnesium 1.5 (*)    All other components within normal limits  LACTIC ACID, PLASMA - Abnormal; Notable for the following components:   Lactic Acid, Venous 4.0 (*)    All other components within normal limits  COMPREHENSIVE METABOLIC PANEL - Abnormal; Notable for the following components:   CO2 21 (*)    BUN 44 (*)    Creatinine, Ser 1.54 (*)    Calcium 7.8 (*)    Total Protein 5.4 (*)    Albumin 2.6 (*)    AST 47 (*)    Total Bilirubin 1.3 (*)    GFR calc non Af Amer 41 (*)    GFR calc Af Amer 47 (*)    All other components within normal limits  CBC WITH DIFFERENTIAL/PLATELET - Abnormal; Notable for the following components:   WBC 25.5 (*)    RBC 3.00 (*)    Hemoglobin 8.9 (*)    HCT 28.0 (*)    RDW 16.7 (*)    Neutro  Abs 21.8 (*)    Monocytes Absolute 2.1 (*)    Abs Immature Granulocytes 0.40 (*)    All other components within normal limits  TROPONIN I (HIGH SENSITIVITY) - Abnormal; Notable for the following components:   Troponin I (High Sensitivity) 43 (*)    All other components within normal limits  TROPONIN I (HIGH SENSITIVITY) - Abnormal; Notable for the following components:   Troponin I (High Sensitivity) 32 (*)    All other components within normal limits  SARS CORONAVIRUS 2 BY RT PCR (HOSPITAL ORDER, [place] [facility])  CULTURE, BLOOD (ROUTINE X 2)  CULTURE, BLOOD (ROUTINE X 2)  URINE CULTURE  PROTIME-INR  APTT  FIBRINOGEN  LACTIC ACID, PLASMA  URINALYSIS, COMPLETE (UACMP) WITH MICROSCOPIC  POC OCCULT BLOOD, ED  TYPE AND SCREEN  PREPARE RBC (CROSSMATCH)   ____________________________________________  EKG  Sinus tachycardia with a rate of 131 with normal axis, unremarkable intervals, and diffuse ST changes concerning for ischemia. ____________________________________________  RADIOLOGY  Official radiology report(s): DG Chest Port 1 View  Result Date: 08/09/2019 CLINICAL DATA:  Sepsis. EXAM: PORTABLE CHEST 1 VIEW COMPARISON:  August 03, 2019 FINDINGS: There is a new small right-sided pleural effusion. There are bibasilar airspace opacities favored to represent areas of atelectasis. The heart size is unchanged. There are aortic calcifications. There is no pneumothorax. IMPRESSION: 1. New small right-sided pleural effusion. 2. Bibasilar airspace opacities favored to represent areas of atelectasis. Electronically Signed   By: Harrell Gave  Green M.D.   On: 08/09/2019 17:32   CT Angio Abd/Pel W and/or Wo Contrast  Result Date: 08/09/2019 CLINICAL DATA:  GI bleed, recent discharge for same EXAM: CTA ABDOMEN AND PELVIS WITHOUT AND WITH CONTRAST TECHNIQUE: Multidetector CT imaging of the abdomen and pelvis was performed using the standard protocol during bolus administration  of intravenous contrast. Multiplanar reconstructed images and MIPs were obtained and reviewed to evaluate the vascular anatomy. CONTRAST:  14mL OMNIPAQUE IOHEXOL 350 MG/ML SOLN COMPARISON:  Renal ultrasound 08/04/2019, CT angio chest 11/22/2018, abdominal ultrasound 08/19/2011 FINDINGS: VASCULAR Aorta: Calcified noncalcified atheromatous plaque present throughout the native abdominal aorta. No acute luminal abnormality, periaortic stranding or hemorrhage is seen. No aneurysm or ectasia. Celiac: Ostial plaque at the celiac axis origin results in some mild ostial narrowing which is exacerbated by slightly hook like configuration implicating some compression by the median arcuate ligament. There is post stenotic dilatation up to 10 mm in size. Minimal plaque is present in the splenic artery branch as well. No other significant stenosis is seen however. No acute luminal abnormality such as dissection, aneurysm or features of vasculitis. SMA: Mild ostial plaque narrowing at the SMA origin with calcified and noncalcified plaque in the proximal SMA resulting in at most mild multifocal narrowings of the proximal vessel. Vessel otherwise normally opacified without acute luminal abnormality or features of vasculitis. Renals: Single renal arteries bilaterally. Ostial plaque results in mild narrowing on the right and moderate narrowing on the left. No evidence of aneurysm, dissection, vasculitis or features of fibromuscular dysplasia. IMA: Ostial plaque resulting in severe narrowing of the IMA artery origin. Vessel is otherwise opacified normally without significant stenosis. No acute luminal abnormality. No features of vasculitis. Inflow: Extensive calcific plaque throughout the common, internal external iliac arteries which does result in some mild segmental narrowing of the proximal internal iliac arteries bilaterally. Proximal branches are otherwise normally opacified. Proximal Outflow: Plaque seen throughout the proximal  outflow vessels including the bilateral common femoral and visualized superficial femoral and deep femoral arteries. Results in at least some mild narrowing at the right deep femoral origin. No other significant stenosis or acute luminal abnormality is seen. No aneurysm or ectasia. Veins: No acute abnormality of the major venous branches seen on venous or arterial phase imaging. Portal and hepatic veins are both filled well opacified and free of acute abnormality. Review of the MIP images confirms the above findings. NON-VASCULAR Lower chest: Small bilateral pleural effusions with some adjacent passive atelectasis which appears to enhance uniformly. Mild basilar bronchitic changes are present. Mild cardiomegaly with biatrial enlargement and three-vessel coronary artery disease as well as extensive calcification of the aortic leaflets and mitral valve annulus. Hepatobiliary: Punctate likely benign calcification in the caudate, could reflect sequela of prior granulomatous disease. No concerning liver lesions. Normal liver attenuation. Smooth surface contour. Marked distension of the gallbladder without visible calcified gallstone within the gallbladder lumen or biliary tree. No pericholecystic fluid or inflammation. No biliary ductal dilatation. Pancreas: Moderate pancreatic atrophy. No pancreatic inflammation, discernible lesion or ductal dilatation. Spleen: Normal arterial and venous enhancement patterns of the spleen. Splenic size is upper limits normal. No focal splenic lesion. Adrenals/Urinary Tract: Mild lobular thickening of the adrenal glands may reflect some senescent adrenal hyperplasia. Kidneys enhance symmetrically. Calcification in the interpolar right kidney, previously suspected to reflect a renal calculus more accurately reflects a peripherally calcified, partially exophytic cyst (Bosniak IIF). No other focal concerning renal lesions. Mild circumferential thickening of the bladder wall with  perivesicular hazy  stranding and indentation of bladder base by an enlarged prostate. Stomach/Bowel: Small amount of posterior layering hyperdensity within a left upper quadrant small bowel loop likely reflect ingested material given the presence on precontrast examination. No abnormal sites of contrast blush or accumulation are seen to suggest a discernible site of active bleeding. Small sliding-type hiatal hernia. Distal stomach is unremarkable. Small duodenal lipoma (7/88) as well as two air-filled duodenal diverticula measuring up to 3.7 cm (7/80, 7/67) no small bowel thickening or dilatation. Appendix is not visualized. No focal inflammation the vicinity of the cecum to suggest an occult appendicitis. No colonic dilatation or wall thickening. Portion of the sigmoid colon protrudes into an indirect left inguinal hernia without convincing evidence of vascular compromise of the bowel wall or resulting mechanical obstruction. Into a scattered colonic diverticula without focal inflammation to suggest diverticulitis. 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 No pathologically enlarged lymph nodes. Reproductive: Mild prostatomegaly. No focal concerning abnormality of the prostate or seminal vesicles. Other: Diffuse circumferential body wall edema most pronounced posteriorly. Fat and bowel containing left inguinal hernia. Small fat containing right inguinal hernia as well. No abdominopelvic free air or fluid. Musculoskeletal: Multilevel degenerative changes are present in the imaged portions of the spine. 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 Additional degenerative changes of the hips and pelvis including partial ankylosis of the SI joints.  IMPRESSION: 1. No abnormal sites of contrast blush or accumulation are seen to suggest a discernible site of active bleeding. If there is persisting concern for slow bleed or intermittent bleeding, nuclear medicine bleeding scan could be performed. 2. Duodenal and colonic diverticulosis without evidence of active diverticular inflammation, can present with GI bleeding in the absence of inflammation. 3. Aortic Atherosclerosis (ICD10-I70.0). Multilevel splanchnic and renal artery stenoses secondary to atheromatous disease, as detailed level by level above. 4. Hook-like configuration of the celiac axis could reflect some compression by the median arcuate ligament. Mild poststenotic dilatation. 5. Portion of the sigmoid colon protrudes into an indirect left inguinal hernia without convincing evidence of vascular compromise of the bowel wall or resulting mechanical obstruction. 6. 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 7. Mild circumferential thickening of the bladder wall with perivesicular hazy stranding and indentation of bladder base by an enlarged prostate. Findings are suggestive of cystitis and/or chronic outlet obstruction. Correlate with urinalysis. 8. Cardiomegaly with biatrial enlargement and three-vessel coronary artery disease. 9. Features suggestive of anasarca with body wall edema and bilateral pleural effusions. 10. Thick peripheral calcifications of the partially exophytic interpolar right renal cyst measuring up to 10 mm. Recommend renal protocol MRI at 6 and 12 months then yearly for 5 years. This recommendation follows ACR consensus guidelines: Management of the Incidental Renal Mass on CT: A White Paper of the ACR Incidental Findings Committee. J Am Coll Radiol [phone]. Electronically Signed   By: Lovena Le M.D.   On: 08/09/2019 19:17    ____________________________________________   PROCEDURES  Procedure(s) performed (including Critical  Care):  .1-3 Lead EKG Interpretation Performed by: Lucrezia Starch, MD Authorized by: Lucrezia Starch, MD     Interpretation: abnormal     ECG rate assessment: tachycardic     Rhythm: sinus tachycardia     Ectopy: none     Conduction: normal    .Critical Care  Performed by: Lucrezia Starch, MD Authorized by: Lucrezia Starch, MD   Critical care provider statement:    Critical care time (minutes):  150   Critical care time was exclusive of:  Separately billable procedures and treating other patients   Critical care was necessary to treat or prevent imminent or life-threatening deterioration of the following conditions:  Shock, dehydration and sepsis   Critical care was time spent personally by me on the following activities:  Discussions with consultants, evaluation of patient's response to treatment, examination of patient, ordering and performing treatments and interventions, ordering and review of laboratory studies, ordering and review of radiographic studies, pulse oximetry, re-evaluation of patient's condition, obtaining history from patient or surrogate and review of old charts     ____________________________________________   INITIAL IMPRESSION / [place] / ED COURSE        Overall patient's history, exam, and ED work-up was concerning for septic shock likely secondary to sepsis as well as hemorrhagic from upper GI bleed.  Patient is noted to be tachycardic with otherwise stable vital signs on arrival.  Exam as above without clear source of infectious etiology although he is noted to have melanotic stools.  Chest x-ray does not show clear pneumonia although does show small effusion.  CTA abdomen pelvis does not show active bleeding although does show diverticuli which per radiology could possibly contributing to patient's blood loss.  No other acute intra-abdominal process that would explain patient's sepsis.  T wave changes on ECG and mild elevated troponin  likely secondary to demand ischemia I have low suspicion for cardiogenic shock or acute coronary thrombosis at this time.  In addition to broad-spectrum antibiotics given as noted below blood cultures were obtained and patient was given Protonix after discussion with GI.  GI service stated they would see the patient in the morning but did not believe he required emergent endoscopy or colonoscopy.  In addition to gentle IV fluid hydration patient was also given 1 unit of PRBCs given concern for hemorrhage contributing to shock.  On my reassessment patient's heart rate had noted to decrease to 105 however his blood pressure had also noted to decrease to 100/70.  Given evidence of septic shock with a lactate of 4 and decreasing blood pressure critical care service was engaged is after discussion with hospitalist do believe patients would be better handled in a critical care service then stepdown given there is concern for possible deterioration overnight despite aggressive treatment.  Medications  vancomycin (VANCOCIN) IVPB 1000 mg/200 mL premix (1,000 mg Intravenous New Bag/Given 08/09/19 1909)  magnesium sulfate IVPB 2 g 50 mL (2 g Intravenous New Bag/Given 08/09/19 1915)  0.9 %  sodium chloride infusion (Manually program via Guardrails IV Fluids) (has no administration in time range)  octreotide (SANDOSTATIN) 500 mcg in sodium chloride 0.9 % 250 mL (2 mcg/mL) infusion (has no administration in time range)  pantoprazole (PROTONIX) 80 mg in sodium chloride 0.9 % 100 mL IVPB (0 mg Intravenous Stopped 08/09/19 1823)  lactated ringers bolus 500 mL (0 mLs Intravenous Stopped 08/09/19 1946)  ceFEPIme (MAXIPIME) 2 g in sodium chloride 0.9 % 100 mL IVPB (0 g Intravenous Stopped 08/09/19 1844)  metroNIDAZOLE (FLAGYL) IVPB 500 mg (0 mg Intravenous Stopped 08/09/19 1909)  iohexol (OMNIPAQUE) 350 MG/ML injection 75 mL (75 mLs Intravenous Contrast Given 08/09/19 1809)  lactated ringers bolus 1,000 mL (0 mLs Intravenous Stopped  08/09/19 1948)  ____________________________________________   FINAL CLINICAL IMPRESSION(S) / ED DIAGNOSES  Final diagnoses:  Septic shock ([place])  Gastrointestinal hemorrhage, unspecified gastrointestinal hemorrhage type  Hypomagnesemia  Troponin I above reference range  Lactic acid acidosis     ED Discharge Orders    None       Note:  This document was prepared using Dragon voice recognition software and may include unintentional dictation errors.   Lucrezia Starch, MD 08/09/19 1955    Lucrezia Starch, MD 08/09/19 2004

## 2019-08-09 NOTE — ED Notes (Signed)
ED pyxis out of stock of Cefepime, pharmacy called to send to Ed

## 2019-08-09 NOTE — H&P (Signed)
Name: Albert [name] MRN: [id number] DOB: November 27, 1933    ADMISSION DATE:  08/09/2019 CONSULTATION DATE:  08/09/2019  REFERRING MD :  Dr. Tamala Julian  CHIEF COMPLAINT:  Melena  BRIEF PATIENT DESCRIPTION:  86 year old male admitted with acute GI bleed and sepsis of unknown etiology.  SIGNIFICANT EVENTS  8/4: Present to ED; PCCM contacted for ICU admission  STUDIES:  8/4: CXR>> There is a new small right-sided pleural effusion. There are bibasilar airspace opacities favored to represent areas of atelectasis. The heart size is unchanged. There are aortic calcifications. There is no pneumothorax. 8/4: CTA Abdomen & Pelvis>>No abnormal sites of contrast blush or accumulation are seen to suggest a discernible site of active bleeding. If there is persisting concern for slow bleed or intermittent bleeding, nuclear medicine bleeding scan could be performed. 2. Duodenal and colonic diverticulosis without evidence of active diverticular inflammation, can present with GI bleeding in the absence of inflammation. 3. Aortic Atherosclerosis (ICD10-I70.0). Multilevel splanchnic and renal artery stenoses secondary to atheromatous disease, as detailed level by level above. 4. Hook-like configuration of the celiac axis could reflect some compression by the median arcuate ligament. Mild poststenotic dilatation. 5. Portion of the sigmoid colon protrudes into an indirect left inguinal hernia without convincing evidence of vascular compromise of the bowel wall or resulting mechanical obstruction. 6. 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 7. Mild circumferential thickening of the bladder wall with perivesicular hazy stranding and indentation of bladder base by an enlarged prostate. Findings are suggestive of cystitis and/or chronic outlet obstruction. Correlate with urinalysis. 8. Cardiomegaly with biatrial enlargement and three-vessel coronary artery  disease. 9. Features suggestive of anasarca with body wall edema and bilateral pleural effusions. 10. Thick peripheral calcifications of the partially exophytic interpolar right renal cyst measuring up to 10 mm. Recommend renal protocol MRI at 6 and 12 months then yearly for 5 years 8/4: RUQ Abdominal US>>Mildly coarsened liver echotexture is nonspecific. Could correlate with LFTs. Gallbladder distention with extensive biliary sludge. No visible calcified gallstones. No additional supporting features of acute cholecystitis however distention is a nonspecific finding that can reflect gallbladder dysfunction/chronic cholecystitis. Could consider outpatient evaluation with HIDA scan with gallbladder ejection fraction.  CULTURES: SARS-CoV-2 PCR 8/4>> negative Blood culture x2 8/4>> Urine 8/4>>  ANTIBIOTICS: Cefepime 8/4>> Flagyl 8/4>> Vancomycin 8/4>>  HISTORY OF PRESENT ILLNESS:   Albert [name] is a 85 year old male with a past medical history significant for hypertension, chronic kidney disease stage III, chronic pain, COVID-19 in 11/2018, and recent admission 7/29/1 to 08/08/2019 for upper GI bleed (found to have esophageal and duodenal ulcer, no active bleeding on EGD performed 08/05/2019) presents to [facility] ED on 08/09/2019 due to weakness and melena.  Per patient report, his home health aide was concerned about dark black stools in his diapers.  He denies chest pain, shortness of breath, cough, fever, chills, dysuria, abdominal pain, nausea, vomiting, diarrhea.  Upon presentation to the ED he was noted to be tachycardic with otherwise stable vital signs.  Initial work-up in the ED revealed WBC 28, lactic acid 4, hemoglobin 9.7, high-sensitivity troponin 43, bicarb 20, creatinine 1.43, BUN 43.  Chest x-ray with right pleural effusion and bilateral airspace opacities concerning for atelectasis.  CTA abdomen and pelvis with no discernible site of active bleeding, diverticulosis (no evidence of  active diverticulitis), questionable mesenteritis, questionable cystitis, and distended gallbladder.  RUQ Abdominal US showed extensive biliary sludge, but no evidence of gallstones or cholecystitis. Urinalysis is currently pending. His COVID-19  PCR is negative.  He was given broad spectrum antibiotics, placed on Octreotide and Protonix drips, and is to receive 1 unit pRBC's.  While in the ED he had a brief episode of transient hypotension.  PCCM is asked to admit the patient to ICU for further workup and treatment of Acute GI bleed and sepsis of unknown etiology.  PAST MEDICAL HISTORY :   has a past medical history of Actinic keratosis (04/23/2008), Basal cell carcinoma (06/17/2006), Basal cell carcinoma (05/26/2012), Basal cell carcinoma (11/02/2012), Basal cell carcinoma (08/15/2014), Basal cell carcinoma (02/23/2017), Basal cell carcinoma (12/08/2017), Hypertension, Squamous cell carcinoma of skin (02/14/2014), and Squamous cell carcinoma of skin (10/02/2014).  has a past surgical history that includes Esophagogastroduodenoscopy (N/A, 08/05/2019) and Colonoscopy (N/A, 08/05/2019). Prior to Admission medications   Medication Sig Start Date End Date Taking? Authorizing Provider  aspirin EC 81 MG tablet Take 1 tablet (81 mg total) by mouth daily. Swallow whole.start 08/19/19 08/08/19 08/07/20 Yes Georgette Shell, MD  atorvastatin (LIPITOR) 20 MG tablet Take 20 mg by mouth daily. 11/20/18  Yes [provider]  Multiple Vitamin (MULTIVITAMIN WITH MINERALS) TABS tablet Take 1 tablet by mouth daily. 08/09/19  Yes Georgette Shell, MD  ondansetron (ZOFRAN) 4 MG tablet Take 1 tablet (4 mg total) by mouth every 6 (six) hours as needed for nausea. 08/08/19  Yes Georgette Shell, MD  pantoprazole (PROTONIX) 40 MG tablet Take 1 tablet (40 mg total) by mouth daily. 08/09/19  Yes Georgette Shell, MD   Allergies  Allergen Reactions  . Penicillins     POA reports penicillin as allergy but unsure  of reaction    FAMILY HISTORY:  family history is not on file. SOCIAL HISTORY:  reports that he has never smoked. He has never used smokeless tobacco. He reports previous alcohol use. He reports that he does not use drugs.   COVID-19 DISASTER DECLARATION:  FULL CONTACT PHYSICAL EXAMINATION WAS NOT POSSIBLE DUE TO TREATMENT OF COVID-19 AND  CONSERVATION OF PERSONAL PROTECTIVE EQUIPMENT, LIMITED EXAM FINDINGS INCLUDE-  His assessed or the symptoms described in the history of present illness.  In the context of the Global COVID-19 pandemic, which necessitated consideration that the patient might be at risk for infection with the SARS-CoV-2 virus that causes COVID-19, Institutional protocols and algorithms that pertain to the evaluation of patients at risk for COVID-19 are in a state of rapid change based on information released by regulatory bodies including the CDC and federal and state organizations. These policies and algorithms were followed during the patient's care while in hospital.  REVIEW OF SYSTEMS: Positives in BOLD   Constitutional: Negative for fever, chills, weight loss, +malaise/fatigue and diaphoresis.  HENT: Negative for hearing loss, ear pain, nosebleeds, congestion, sore throat, neck pain, tinnitus and ear discharge.   Eyes: Negative for blurred vision, double vision, photophobia, pain, discharge and redness.  Respiratory: Negative for cough, hemoptysis, sputum production, shortness of breath, wheezing and stridor.   Cardiovascular: Negative for chest pain, +palpitations, orthopnea, claudication, leg swelling and PND.  Gastrointestinal: Negative for heartburn, nausea, vomiting, abdominal pain, diarrhea, constipation, blood in stool and +melena.  Genitourinary: Negative for dysuria, urgency, frequency, hematuria and flank pain.  Musculoskeletal: Negative for myalgias, back pain, joint pain and falls.  Skin: Negative for itching and rash.  Neurological: Negative for  dizziness, tingling, tremors, sensory change, speech change, focal weakness, seizures, loss of consciousness, weakness and headaches.  Endo/Heme/Allergies: Negative for environmental allergies and polydipsia. Does not bruise/bleed easily.  SUBJECTIVE:  Pt reports generalized weakness and fatigue, reports BLE edema He was told by caregivers he had dark stools Denies chest pain, SOB, cough, abdominal pain, N/V/D, dysuria, fever/chills On room air, vital WNL  VITAL SIGNS: Temp:  [98 F (36.7 C)] 98 F (36.7 C) (08/04 1616) Pulse Rate:  [105-128] 105 (08/04 1912) Resp:  [14-20] 16 (08/04 1912) BP: (90-147)/(49-110) 90/60 (08/04 1912) SpO2:  [97 %-100 %] 100 % (08/04 1912) Weight:  [62 kg] 62 kg (08/04 1616)  PHYSICAL EXAMINATION: General: Acutely ill-appearing male, sitting in bed, on room air, no acute distress Neuro: Sleeping, arouses to voice, alert and oriented x4, follows commands, no focal deficits, speech clear HEENT: Atraumatic, normocephalic, neck supple, no JVD, pupils PERRLA Cardiovascular: Irregular irregular rhythm, rate controlled, no murmurs, rubs, gallops Lungs: Clear diminished to auscultation bilaterally, no wheezing or rales, even, nonlabored Abdomen: Soft, nontender, nondistended, no guarding or rebound tenderness, bowel sounds positive x4 Musculoskeletal: Generalized weakness, no deformities, 1+ edema bilateral lower extremities Skin: Warm and dry.  No obvious rashes, lesions, ulcerations  Recent Labs  Lab 08/08/19 0448 08/09/19 1624 08/09/19 1727  NA 139 138 140  K 4.4 4.3 4.8  CL 108 106 107  CO2 24 20* 21*  BUN 46* 43* 44*  CREATININE 1.46* 1.43* 1.54*  GLUCOSE 108* 100* 96   Recent Labs  Lab 08/07/19 1418 08/09/19 1624 08/09/19 1727  HGB 8.3* 9.7* 8.9*  HCT 24.9* 30.4* 28.0*  WBC 7.2 28.0* 25.5*  PLT 119* 219 208   DG Chest Port 1 View  Result Date: 08/09/2019 CLINICAL DATA:  Sepsis. EXAM: PORTABLE CHEST 1 VIEW COMPARISON:  August 03, 2019  FINDINGS: There is a new small right-sided pleural effusion. There are bibasilar airspace opacities favored to represent areas of atelectasis. The heart size is unchanged. There are aortic calcifications. There is no pneumothorax. IMPRESSION: 1. New small right-sided pleural effusion. 2. Bibasilar airspace opacities favored to represent areas of atelectasis. Electronically Signed   By: Constance Holster M.D.   On: 08/09/2019 17:32   CT Angio Abd/Pel W and/or Wo Contrast  Addendum Date: 08/09/2019   ADDENDUM REPORT: 08/09/2019 19:58 ADDENDUM: Additional impression point: Gallbladder distention is a nonspecific finding in the absence of inflammatory change. Chronic cholecystitis/gallbladder dysfunction as well as occasionally acute cholecystitis can present with this appearance. Recommend correlation with right upper quadrant symptoms and labs. Could consider further evaluation with right upper quadrant ultrasound. Addendum by telephone at the time of submission on 08/09/2019 at 7:57 pm to provider [facility] , who verbally acknowledged these results. Electronically Signed   By: Lovena Le M.D.   On: 08/09/2019 19:58   Result Date: 08/09/2019 CLINICAL DATA:  GI bleed, recent discharge for same EXAM: CTA ABDOMEN AND PELVIS WITHOUT AND WITH CONTRAST TECHNIQUE: Multidetector CT imaging of the abdomen and pelvis was performed using the standard protocol during bolus administration of intravenous contrast. Multiplanar reconstructed images and MIPs were obtained and reviewed to evaluate the vascular anatomy. CONTRAST:  61mL OMNIPAQUE IOHEXOL 350 MG/ML SOLN COMPARISON:  Renal ultrasound 08/04/2019, CT angio chest 11/22/2018, abdominal ultrasound 08/19/2011 FINDINGS: VASCULAR Aorta: Calcified noncalcified atheromatous plaque present throughout the native abdominal aorta. No acute luminal abnormality, periaortic stranding or hemorrhage is seen. No aneurysm or ectasia. Celiac: Ostial plaque at the celiac axis origin  results in some mild ostial narrowing which is exacerbated by slightly hook like configuration implicating some compression by the median arcuate ligament. There is post stenotic dilatation up to 10 mm in size.  Minimal plaque is present in the splenic artery branch as well. No other significant stenosis is seen however. No acute luminal abnormality such as dissection, aneurysm or features of vasculitis. SMA: Mild ostial plaque narrowing at the SMA origin with calcified and noncalcified plaque in the proximal SMA resulting in at most mild multifocal narrowings of the proximal vessel. Vessel otherwise normally opacified without acute luminal abnormality or features of vasculitis. Renals: Single renal arteries bilaterally. Ostial plaque results in mild narrowing on the right and moderate narrowing on the left. No evidence of aneurysm, dissection, vasculitis or features of fibromuscular dysplasia. IMA: Ostial plaque resulting in severe narrowing of the IMA artery origin. Vessel is otherwise opacified normally without significant stenosis. No acute luminal abnormality. No features of vasculitis. Inflow: Extensive calcific plaque throughout the common, internal external iliac arteries which does result in some mild segmental narrowing of the proximal internal iliac arteries bilaterally. Proximal branches are otherwise normally opacified. Proximal Outflow: Plaque seen throughout the proximal outflow vessels including the bilateral common femoral and visualized superficial femoral and deep femoral arteries. Results in at least some mild narrowing at the right deep femoral origin. No other significant stenosis or acute luminal abnormality is seen. No aneurysm or ectasia. Veins: No acute abnormality of the major venous branches seen on venous or arterial phase imaging. Portal and hepatic veins are both filled well opacified and free of acute abnormality. Review of the MIP images confirms the above findings. NON-VASCULAR Lower  chest: Small bilateral pleural effusions with some adjacent passive atelectasis which appears to enhance uniformly. Mild basilar bronchitic changes are present. Mild cardiomegaly with biatrial enlargement and three-vessel coronary artery disease as well as extensive calcification of the aortic leaflets and mitral valve annulus. Hepatobiliary: Punctate likely benign calcification in the caudate, could reflect sequela of prior granulomatous disease. No concerning liver lesions. Normal liver attenuation. Smooth surface contour. Marked distension of the gallbladder without visible calcified gallstone within the gallbladder lumen or biliary tree. No pericholecystic fluid or inflammation. No biliary ductal dilatation. Pancreas: Moderate pancreatic atrophy. No pancreatic inflammation, discernible lesion or ductal dilatation. Spleen: Normal arterial and venous enhancement patterns of the spleen. Splenic size is upper limits normal. No focal splenic lesion. Adrenals/Urinary Tract: Mild lobular thickening of the adrenal glands may reflect some senescent adrenal hyperplasia. Kidneys enhance symmetrically. Calcification in the interpolar right kidney, previously suspected to reflect a renal calculus more accurately reflects a peripherally calcified, partially exophytic cyst (Bosniak IIF). No other focal concerning renal lesions. Mild circumferential thickening of the bladder wall with perivesicular hazy stranding and indentation of bladder base by an enlarged prostate. Stomach/Bowel: Small amount of posterior layering hyperdensity within a left upper quadrant small bowel loop likely reflect ingested material given the presence on precontrast examination. No abnormal sites of contrast blush or accumulation are seen to suggest a discernible site of active bleeding. Small sliding-type hiatal hernia. Distal stomach is unremarkable. Small duodenal lipoma (7/88) as well as two air-filled duodenal diverticula measuring up to 3.7 cm  (7/80, 7/67) no small bowel thickening or dilatation. Appendix is not visualized. No focal inflammation the vicinity of the cecum to suggest an occult appendicitis. No colonic dilatation or wall thickening. Portion of the sigmoid colon protrudes into an indirect left inguinal hernia without convincing evidence of vascular compromise of the bowel wall or resulting mechanical obstruction. Into a scattered colonic diverticula without focal inflammation to suggest diverticulitis. 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 No pathologically enlarged lymph nodes. Reproductive: Mild prostatomegaly. No focal concerning abnormality of the prostate or seminal vesicles. Other: Diffuse circumferential body wall edema most pronounced posteriorly. Fat and bowel containing left inguinal hernia. Small fat containing right inguinal hernia as well. No abdominopelvic free air or fluid. Musculoskeletal: Multilevel degenerative changes are present in the imaged portions of the spine. 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 Additional degenerative changes of the hips and pelvis including partial ankylosis of the SI joints. IMPRESSION: 1. No abnormal sites of contrast blush or accumulation are seen to suggest a discernible site of active bleeding. If there is persisting concern for slow bleed or intermittent bleeding, nuclear medicine bleeding scan could be performed. 2. Duodenal and colonic diverticulosis without evidence of active diverticular inflammation, can present with GI bleeding in the absence of inflammation. 3. Aortic Atherosclerosis (ICD10-I70.0). Multilevel splanchnic and renal artery stenoses secondary to atheromatous disease, as detailed level by level  above. 4. Hook-like configuration of the celiac axis could reflect some compression by the median arcuate ligament. Mild poststenotic dilatation. 5. Portion of the sigmoid colon protrudes into an indirect left inguinal hernia without convincing evidence of vascular compromise of the bowel wall or resulting mechanical obstruction. 6. 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 7. Mild circumferential thickening of the bladder wall with perivesicular hazy stranding and indentation of bladder base by an enlarged prostate. Findings are suggestive of cystitis and/or chronic outlet obstruction. Correlate with urinalysis. 8. Cardiomegaly with biatrial enlargement and three-vessel coronary artery disease. 9. Features suggestive of anasarca with body wall edema and bilateral pleural effusions. 10. Thick peripheral calcifications of the partially exophytic interpolar right renal cyst measuring up to 10 mm. Recommend renal protocol MRI at 6 and 12 months then yearly for 5 years. This recommendation follows ACR consensus guidelines: Management of the Incidental Renal Mass on CT: A White Paper of the ACR Incidental Findings Committee. J Am Coll Radiol [phone]. Electronically Signed: By: Lovena Le M.D. On: 08/09/2019 19:17    ASSESSMENT / PLAN:  Acute GI Bleed Hx: Pt admitted 08/03/19-08/08/19 for GI bleed and anemia; EGD on 7/31 showed esophageal ulcer and 2 duodenal ulcers with no stigmata of bleeding -NPO -Monitor for s/sx of bleeding -Protonix & Octreotide gtt's -GI consulted, appreciate input -CTA Abdomen & Pelvis 08/09/19 with no active bleeding noted, did show duodenal & colonic diverticulosis w/o evidence of active diverticular inflammation -Consider Tagged RBC scan  Acute Blood Loss Anemia -Monitor for S/Sx of bleeding -Trend CBC (follow H&H q6h) -SCD's for VTE Prophylaxis (no chemical prophylaxis given bleed) -Transfuse for Hgb <8 -To receive 1 unit pRBC's  08/09/19  Meets SIRS Criteria Sepsis of unknown etiology -Monitor fever curve -Trend WBC's & Procalcitonin -Follow cultures as above -CXR with bilateral opacities, seem more consistent with atelectasis, Urinalysis is pending -CTA Abdomen/Pelvis 8/4 with diverticulosis (no evidence of diverticulitis), ? Mesenteritis, ? Cystitis -RUQ Abdominal US 8/4 with distended Gallbladder and biliary sludge, but NO EVIDENCE of Gallstones or Cholecystitis -Continue Cefepime, Flagyl, and Vancomycin for now -Consider General Surgery consult  Atrial Fibrillation, rate currently controlled Mildly elevated troponin, suspect demand ischemia Transient Hypotension Hx: HTN -Continuous cardiac monitoring -Maintain MAP >65 -IV fluids -Vasopressors as needed to maintain MAP goal -Transfusions as indicated -Trend lactic acid  CKD Stage III -Monitor I&O's / urinary output -Follow BMP -Ensure adequate renal perfusion -Avoid nephrotoxic  agents as able -Replace electrolytes as indicated             BEST PRACTICES DISPOSITION: ICU GOALS OF CARE: Full Code VTE PROPHYLAXIS: SCD's (no chemical prophylaxis given GI Bleed) STRESS ULCER PROPHYLAXIS: Protonix CONSULTS: GI UPDATES: Updated pt at bedside 08/09/19   Darel Hong, [facility] Pager: [phone]  08/09/2019, 8:28 PM.3

## 2019-08-09 NOTE — Progress Notes (Signed)
CODE SEPSIS - PHARMACY COMMUNICATION  **Broad Spectrum Antibiotics should be administered within 1 hour of Sepsis diagnosis**  Time Code Sepsis Called/Page Received: 1701  Antibiotics Ordered: cefepime/vancomycin/metronidazole  Time of 1st antibiotic administration: 1742  Additional action taken by pharmacy: n/a  Benita Gutter  08/09/2019  5:51 PM

## 2019-08-09 NOTE — ED Triage Notes (Signed)
First Nurse Note:  Arrives via [facility] c/o generalized weakness.  Recently DC from [facility] for GI bleed.  Received multple units of blood during hosptialization.  VS wnl.  # 20 g LAC.  AAOx3.  Skin warm and dry. NAD

## 2019-08-09 NOTE — ED Triage Notes (Signed)
Pt comes EMS from home with generalized weakness. Pt recently d/c for GI bleed and received multiple units of blood/plasma. Pt was supposed to go to a rehab unit but denied it and now wants to go as he is having trouble caring for himself.

## 2019-08-09 NOTE — ED Notes (Signed)
Date and time results received: 08/09/19 6:17 PM  (use smartphrase ".now" to insert current time)  Test: Lactic  Critical Value: 4.0  Name of Provider Notified: Dr. Tamala Julian   Orders Received? Or Actions Taken?: No new orders at this time

## 2019-08-09 NOTE — Consult Note (Signed)
Pharmacy Antibiotic Note  Albert [name] is a 86 y.o. male with medical history including CKD stage IIIa admitted on 08/09/2019 with sepsis. Patient was recently admitted 7/29 - 8/3 for GIB. Pharmacy has been consulted for vancomycin and cefepime dosing. Patient is also ordered metronidazole.  Scr today is 1.54 which appears consistent with patient's baseline.   Plan: Cefepime 2 g q12h  --Patient is borderline renal dose adjustment --CrCl by cockcroft gault = 29.9 mL/min --Normalized CrCl = 36 mL/min --Will favor q12h dosing for now. If renal function worsens tomorrow will adjust frequency to q24h  Vancomycin 1500 mg IV x 1 (vancomycin 1 g + 500 mg)   Followed by maintenance regimen of vancomycin 750 mg q24h to start tomorrow evening  Daily Scr per protocol. Levels at steady state as indicated.    Height: 5\' 4"  (162.6 cm) Weight: 62 kg (136 lb 11 oz) IBW/kg (Calculated) : 59.2  Temp (24hrs), Avg:98 F (36.7 C), Min:98 F (36.7 C), Max:98 F (36.7 C)  Recent Labs  Lab 08/05/19 0407 08/05/19 1732 08/06/19 0442 08/07/19 0443 08/07/19 1418 08/08/19 0448 08/09/19 1624 08/09/19 1727 08/09/19 1911  WBC  --  6.1 5.0  --  7.2  --  28.0* 25.5*  --   CREATININE   < >  --  1.99* 1.68*  --  1.46* 1.43* 1.54*  --   LATICACIDVEN  --   --   --   --   --   --   --  4.0* 4.2*   < > = values in this interval not displayed.    Estimated Creatinine Clearance: 29.9 mL/min (A) (by C-G formula based on SCr of 1.54 mg/dL (H)).    Allergies  Allergen Reactions  . Penicillins     POA reports penicillin as allergy but unsure of reaction    Antimicrobials this admission: Vancomycin 8/4 >>  Cefepime 8/4 >>  Metronidazole 8/4 >>   Dose adjustments this admission: n/a  Microbiology results: 8/4 BCx: pending 8/4 UCx: pending  8/4 MRSA PCR: pending  Thank you for allowing pharmacy to be a part of this patient's care.  Benita Gutter 08/09/2019 8:28 PM

## 2019-08-09 NOTE — Sepsis Progress Note (Signed)
Notified bedside nurse of need to draw lactic acid and blood cultures.  

## 2019-08-09 NOTE — Consult Note (Signed)
PHARMACY -  BRIEF ANTIBIOTIC NOTE   Pharmacy has received consult(s) for cefepime and vancomycin from an ED provider. Patient is also ordered metronidazole. The patient's profile has been reviewed for ht/wt/allergies/indication/available labs.    Patient does have an allergy to PCN (un-sure of reaction). However, he has tolerated cephalosporins (ceftriaxone) in the past per chart.   One time order(s) placed for  --Cefepime 2 g (already ordered) --Vancomycin 1 g (already ordered)  Further antibiotics/pharmacy consults should be ordered by admitting physician if indicated.                   Thank you, Benita Gutter 08/09/2019  5:52 PM

## 2019-08-10 DIAGNOSIS — K922 Gastrointestinal hemorrhage, unspecified: Secondary | ICD-10-CM | POA: Diagnosis present

## 2019-08-10 LAB — COMPREHENSIVE METABOLIC PANEL
ALT: 16 U/L (ref 0–44)
AST: 30 U/L (ref 15–41)
Albumin: 1.9 g/dL — ABNORMAL LOW (ref 3.5–5.0)
Alkaline Phosphatase: 68 U/L (ref 38–126)
Anion gap: 9 (ref 5–15)
BUN: 41 mg/dL — ABNORMAL HIGH (ref 8–23)
CO2: 20 mmol/L — ABNORMAL LOW (ref 22–32)
Calcium: 7.3 mg/dL — ABNORMAL LOW (ref 8.9–10.3)
Chloride: 110 mmol/L (ref 98–111)
Creatinine, Ser: 1.28 mg/dL — ABNORMAL HIGH (ref 0.61–1.24)
GFR calc Af Amer: 59 mL/min — ABNORMAL LOW (ref >60)
GFR calc non Af Amer: 51 mL/min — ABNORMAL LOW (ref >60)
Glucose, Bld: 108 mg/dL — ABNORMAL HIGH (ref 70–99)
Potassium: 4.1 mmol/L (ref 3.5–5.1)
Sodium: 139 mmol/L (ref 135–145)
Total Bilirubin: 1.4 mg/dL — ABNORMAL HIGH (ref 0.3–1.2)
Total Protein: 4.3 g/dL — ABNORMAL LOW (ref 6.5–8.1)

## 2019-08-10 LAB — TYPE AND SCREEN
ABO/RH(D): O POS
Antibody Screen: NEGATIVE
Unit division: 0

## 2019-08-10 LAB — CBC
HCT: 24.4 % — ABNORMAL LOW (ref 39.0–52.0)
Hemoglobin: 8.2 g/dL — ABNORMAL LOW (ref 13.0–17.0)
MCH: 29.8 pg (ref 26.0–34.0)
MCHC: 33.6 g/dL (ref 30.0–36.0)
MCV: 88.7 fL (ref 80.0–100.0)
Platelets: 151 10*3/uL (ref 150–400)
RBC: 2.75 MIL/uL — ABNORMAL LOW (ref 4.22–5.81)
RDW: 16.1 % — ABNORMAL HIGH (ref 11.5–15.5)
WBC: 17.1 10*3/uL — ABNORMAL HIGH (ref 4.0–10.5)
nRBC: 0 % (ref 0.0–0.2)

## 2019-08-10 LAB — URINALYSIS, COMPLETE (UACMP) WITH MICROSCOPIC
Bacteria, UA: NONE SEEN
Bilirubin Urine: NEGATIVE
Glucose, UA: NEGATIVE mg/dL
Ketones, ur: NEGATIVE mg/dL
Nitrite: NEGATIVE
Protein, ur: NEGATIVE mg/dL
Specific Gravity, Urine: 1.021 (ref 1.005–1.030)
WBC, UA: >50 WBC/hpf — ABNORMAL HIGH (ref 0–5)
pH: 5 (ref 5.0–8.0)

## 2019-08-10 LAB — HEMOGLOBIN AND HEMATOCRIT, BLOOD
HCT: 25.1 % — ABNORMAL LOW (ref 39.0–52.0)
HCT: 25.3 % — ABNORMAL LOW (ref 39.0–52.0)
HCT: 25.4 % — ABNORMAL LOW (ref 39.0–52.0)
Hemoglobin: 8.1 g/dL — ABNORMAL LOW (ref 13.0–17.0)
Hemoglobin: 8.1 g/dL — ABNORMAL LOW (ref 13.0–17.0)
Hemoglobin: 8.1 g/dL — ABNORMAL LOW (ref 13.0–17.0)

## 2019-08-10 LAB — BLOOD CULTURE ID PANEL (REFLEXED) - BCID2

## 2019-08-10 LAB — BPAM RBC
Blood Product Expiration Date: 202109022359
ISSUE DATE / TIME: 202108042121
Unit Type and Rh: 5100

## 2019-08-10 LAB — LACTIC ACID, PLASMA: Lactic Acid, Venous: 1.2 mmol/L (ref 0.5–1.9)

## 2019-08-10 LAB — PROCALCITONIN: Procalcitonin: 0.36 ng/mL

## 2019-08-10 NOTE — ED Notes (Signed)
Pt given gingerale as requested. HOB adjusted and pt repositioned.

## 2019-08-10 NOTE — ED Notes (Signed)
Pt resting on his side. BP cuff is slightly elevated above pts heart.

## 2019-08-10 NOTE — Plan of Care (Signed)
  Problem: Education: Goal: Knowledge of [facility] Education information/materials will improve Outcome: Progressing Goal: Emotional status will improve Outcome: Progressing Goal: Mental status will improve Outcome: Progressing Goal: Verbalization of understanding the information provided will improve Outcome: Progressing   

## 2019-08-10 NOTE — ED Notes (Signed)
Pt's diet to advance as tolerated per attending provider Kasa.

## 2019-08-10 NOTE — ED Notes (Signed)
RN called respiratory to place pt on CPAP.

## 2019-08-10 NOTE — ED Notes (Signed)
Pt took off CPAP stating, "I can not breath with that on." RN attempted to explain mask to patient but pt refusing mask.

## 2019-08-10 NOTE — ED Notes (Signed)
Pt updated on room assignment. Belongings gathered and placed in bed with pt as requested. CPAP with pt. Pt has tolerated oral fluids well.

## 2019-08-10 NOTE — ED Notes (Signed)
This RN to bedside. Only bag of meds running is protonix. No octreotide bag hanging/running. Called pharm to confirm that it should be running continuous as stated in MAR/orders. Asked them to send a bag. Pt repositioned. Pt asked if this RN would ask doctor if he can have anything to eat/drink besides water. Old cup of water sitting on pt's bedside table. Will notify provider.

## 2019-08-10 NOTE — ED Notes (Signed)
Admitting NP made aware of apnea.

## 2019-08-10 NOTE — Progress Notes (Signed)
CHIEF COMPLAINT:   Chief Complaint  Patient presents with  . GI Bleeding    Subjective  Alert and awake NAD Not on pressors Not on vent or oxygen      Objective   Review of Systems:  Gen:  Denies  fever, sweats, chills weight loss  HEENT: Denies blurred vision, double vision, ear pain, eye pain, hearing loss, nose bleeds, sore throat Cardiac:  No dizziness, chest pain or heaviness, chest tightness,edema, No JVD Resp:   No cough, -sputum production, -shortness of breath,-wheezing, -hemoptysis,  Gi: Denies swallowing difficulty, stomach pain, nausea or vomiting, diarrhea, constipation, bowel incontinence Gu:  Denies bladder incontinence, burning urine Ext:   Denies Joint pain, stiffness or swelling Skin: Denies  skin rash, easy bruising or bleeding or hives Endoc:  Denies polyuria, polydipsia , polyphagia or weight change Psych:   Denies depression, insomnia or hallucinations  Other:  All other systems negative  Examination:  General exam: Appears calm and comfortable  Respiratory system: Clear to auscultation. Respiratory effort normal. HEENT: [place]/AT, PERRLA, no thrush, no stridor. Cardiovascular system: S1 & S2 heard, RRR. No JVD, murmurs, rubs, gallops or clicks. No pedal edema. Gastrointestinal system: Abdomen is nondistended, soft and nontender. No organomegaly or masses felt. Normal bowel sounds heard. Central nervous system: Alert and oriented. No focal neurological deficits. Extremities: Symmetric 5 x 5 power. Skin: No rashes, lesions or ulcers Psychiatry: Judgement and insight appear normal. Mood & affect appropriate.   VITALS:  height is 5\' 4"  (1.626 m) and weight is 62 kg. His oral temperature is 98.6 F (37 C). His blood pressure is 91/53 (abnormal) and his pulse is 84. His respiration is 14 and oxygen saturation is 98%.   I personally reviewed Labs under Results section.  Radiology Reports DG Chest Port 1 View  Result Date: 08/09/2019 CLINICAL DATA:   Sepsis. EXAM: PORTABLE CHEST 1 VIEW COMPARISON:  August 03, 2019 FINDINGS: There is a new small right-sided pleural effusion. There are bibasilar airspace opacities favored to represent areas of atelectasis. The heart size is unchanged. There are aortic calcifications. There is no pneumothorax. IMPRESSION: 1. New small right-sided pleural effusion. 2. Bibasilar airspace opacities favored to represent areas of atelectasis. Electronically Signed   By: Constance Holster M.D.   On: 08/09/2019 17:32   CT Angio Abd/Pel W and/or Wo Contrast  Addendum Date: 08/09/2019   ADDENDUM REPORT: 08/09/2019 19:58 ADDENDUM: Additional impression point: Gallbladder distention is a nonspecific finding in the absence of inflammatory change. Chronic cholecystitis/gallbladder dysfunction as well as occasionally acute cholecystitis can present with this appearance. Recommend correlation with right upper quadrant symptoms and labs. Could consider further evaluation with right upper quadrant ultrasound. Addendum by telephone at the time of submission on 08/09/2019 at 7:57 pm to provider [facility] , who verbally acknowledged these results. Electronically Signed   By: Lovena Le M.D.   On: 08/09/2019 19:58   Result Date: 08/09/2019 CLINICAL DATA:  GI bleed, recent discharge for same EXAM: CTA ABDOMEN AND PELVIS WITHOUT AND WITH CONTRAST TECHNIQUE: Multidetector CT imaging of the abdomen and pelvis was performed using the standard protocol during bolus administration of intravenous contrast. Multiplanar reconstructed images and MIPs were obtained and reviewed to evaluate the vascular anatomy. CONTRAST:  30mL OMNIPAQUE IOHEXOL 350 MG/ML SOLN COMPARISON:  Renal ultrasound 08/04/2019, CT angio chest 11/22/2018, abdominal ultrasound 08/19/2011 FINDINGS: VASCULAR Aorta: Calcified noncalcified atheromatous plaque present throughout the native abdominal aorta. No acute luminal abnormality, periaortic stranding or hemorrhage is seen. No aneurysm  or ectasia. Celiac: Ostial plaque at the celiac axis origin results in some mild ostial narrowing which is exacerbated by slightly hook like configuration implicating some compression by the median arcuate ligament. There is post stenotic dilatation up to 10 mm in size. Minimal plaque is present in the splenic artery branch as well. No other significant stenosis is seen however. No acute luminal abnormality such as dissection, aneurysm or features of vasculitis. SMA: Mild ostial plaque narrowing at the SMA origin with calcified and noncalcified plaque in the proximal SMA resulting in at most mild multifocal narrowings of the proximal vessel. Vessel otherwise normally opacified without acute luminal abnormality or features of vasculitis. Renals: Single renal arteries bilaterally. Ostial plaque results in mild narrowing on the right and moderate narrowing on the left. No evidence of aneurysm, dissection, vasculitis or features of fibromuscular dysplasia. IMA: Ostial plaque resulting in severe narrowing of the IMA artery origin. Vessel is otherwise opacified normally without significant stenosis. No acute luminal abnormality. No features of vasculitis. Inflow: Extensive calcific plaque throughout the common, internal external iliac arteries which does result in some mild segmental narrowing of the proximal internal iliac arteries bilaterally. Proximal branches are otherwise normally opacified. Proximal Outflow: Plaque seen throughout the proximal outflow vessels including the bilateral common femoral and visualized superficial femoral and deep femoral arteries. Results in at least some mild narrowing at the right deep femoral origin. No other significant stenosis or acute luminal abnormality is seen. No aneurysm or ectasia. Veins: No acute abnormality of the major venous branches seen on venous or arterial phase imaging. Portal and hepatic veins are both filled well opacified and free of acute abnormality. Review of  the MIP images confirms the above findings. NON-VASCULAR Lower chest: Small bilateral pleural effusions with some adjacent passive atelectasis which appears to enhance uniformly. Mild basilar bronchitic changes are present. Mild cardiomegaly with biatrial enlargement and three-vessel coronary artery disease as well as extensive calcification of the aortic leaflets and mitral valve annulus. Hepatobiliary: Punctate likely benign calcification in the caudate, could reflect sequela of prior granulomatous disease. No concerning liver lesions. Normal liver attenuation. Smooth surface contour. Marked distension of the gallbladder without visible calcified gallstone within the gallbladder lumen or biliary tree. No pericholecystic fluid or inflammation. No biliary ductal dilatation. Pancreas: Moderate pancreatic atrophy. No pancreatic inflammation, discernible lesion or ductal dilatation. Spleen: Normal arterial and venous enhancement patterns of the spleen. Splenic size is upper limits normal. No focal splenic lesion. Adrenals/Urinary Tract: Mild lobular thickening of the adrenal glands may reflect some senescent adrenal hyperplasia. Kidneys enhance symmetrically. Calcification in the interpolar right kidney, previously suspected to reflect a renal calculus more accurately reflects a peripherally calcified, partially exophytic cyst (Bosniak IIF). No other focal concerning renal lesions. Mild circumferential thickening of the bladder wall with perivesicular hazy stranding and indentation of bladder base by an enlarged prostate. Stomach/Bowel: Small amount of posterior layering hyperdensity within a left upper quadrant small bowel loop likely reflect ingested material given the presence on precontrast examination. No abnormal sites of contrast blush or accumulation are seen to suggest a discernible site of active bleeding. Small sliding-type hiatal hernia. Distal stomach is unremarkable. Small duodenal lipoma (7/88) as well  as two air-filled duodenal diverticula measuring up to 3.7 cm (7/80, 7/67) no small bowel thickening or dilatation. Appendix is not visualized. No focal inflammation the vicinity of the cecum to suggest an occult appendicitis. No colonic dilatation or wall thickening. Portion of the sigmoid colon protrudes into an indirect left inguinal  hernia without convincing evidence of vascular compromise of the bowel wall or resulting mechanical obstruction. Into a scattered colonic diverticula without focal inflammation to suggest diverticulitis. 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 No pathologically enlarged lymph nodes. Reproductive: Mild prostatomegaly. No focal concerning abnormality of the prostate or seminal vesicles. Other: Diffuse circumferential body wall edema most pronounced posteriorly. Fat and bowel containing left inguinal hernia. Small fat containing right inguinal hernia as well. No abdominopelvic free air or fluid. Musculoskeletal: Multilevel degenerative changes are present in the imaged portions of the spine. 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 Additional degenerative changes of the hips and pelvis including partial ankylosis of the SI joints. IMPRESSION: 1. No abnormal sites of contrast blush or accumulation are seen to suggest a discernible site of active bleeding. If there is persisting concern for slow bleed or intermittent bleeding, nuclear medicine bleeding scan could be performed. 2. Duodenal and colonic diverticulosis without evidence of active diverticular inflammation, can present with GI bleeding in the absence of inflammation. 3. Aortic Atherosclerosis (ICD10-I70.0). Multilevel splanchnic and renal artery stenoses  secondary to atheromatous disease, as detailed level by level above. 4. Hook-like configuration of the celiac axis could reflect some compression by the median arcuate ligament. Mild poststenotic dilatation. 5. Portion of the sigmoid colon protrudes into an indirect left inguinal hernia without convincing evidence of vascular compromise of the bowel wall or resulting mechanical obstruction. 6. 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 7. Mild circumferential thickening of the bladder wall with perivesicular hazy stranding and indentation of bladder base by an enlarged prostate. Findings are suggestive of cystitis and/or chronic outlet obstruction. Correlate with urinalysis. 8. Cardiomegaly with biatrial enlargement and three-vessel coronary artery disease. 9. Features suggestive of anasarca with body wall edema and bilateral pleural effusions. 10. Thick peripheral calcifications of the partially exophytic interpolar right renal cyst measuring up to 10 mm. Recommend renal protocol MRI at 6 and 12 months then yearly for 5 years. This recommendation follows ACR consensus guidelines: Management of the Incidental Renal Mass on CT: A White Paper of the ACR Incidental Findings Committee. J Am Coll Radiol [phone]. Electronically Signed: By: Lovena Le M.D. On: 08/09/2019 19:17   US Abdomen Limited RUQ  Result Date: 08/09/2019 CLINICAL DATA:  Gallbladder disease, abnormal CT EXAM: ULTRASOUND ABDOMEN LIMITED RIGHT UPPER QUADRANT COMPARISON:  CT 08/09/2019 FINDINGS: Gallbladder: Gallbladder appears diffusely distended with heterogeneous mixture of layering biliary sludge. No visible shadowing gallstones are present. No significant gallbladder wall thickening. Sonographic Percell Miller sign is reportedly negative. Common bile duct: Diameter: 4.8 mm, nondilated Liver: Suboptimal visualization of the left lobe liver due to patient body habitus. Diffusely coarsened hepatic  echotexture without visible focal lesion. Portal vein is patent on color Doppler imaging with normal direction of blood flow towards the liver. Other: None. IMPRESSION: Mildly coarsened liver echotexture is nonspecific. Could correlate with LFTs. Gallbladder distention with extensive biliary sludge. No visible calcified gallstones. No additional supporting features of acute cholecystitis however distention is a nonspecific finding that can reflect gallbladder dysfunction/chronic cholecystitis. Could consider outpatient evaluation with HIDA scan with gallbladder ejection fraction. Electronically Signed   By: Lovena Le M.D.   On: 08/09/2019 21:11       Assessment/Plan:  GIB-stable Plan for GI to scope at some point Transfer to [facility] service  8/6    Corrin Parker, M.D.  [facility]  Medical Director [place] Director [place] Department

## 2019-08-10 NOTE — ED Notes (Signed)
Pt updated that attending is checking with GI doc to see if any changes can be made to diet or if needs to remain NPO. Pt's lower legs placed on pillow per request.

## 2019-08-10 NOTE — Progress Notes (Signed)
I was asked by Nurse Linard Millers If patient patient can be re- redirect  from PCU to Med surg with Tele. Will place orders accordingly.

## 2019-08-10 NOTE — ED Notes (Signed)
Pt had a significant apneic episode while sleeping. Oxygen saturation dropped into the 60s. Alarm woke pt and oxygen saturation improved to 99% on RA

## 2019-08-10 NOTE — Consult Note (Signed)
Albert Lame, MD [facility]  [address] [place], [place] [address] Phone: [phone] Fax : [phone]  Consultation  Referring Provider:     Darel Hong, AGACNP-BC Primary Care Physician:  [name], MD Primary Gastroenterologist:  Dr. Alice Reichert         Reason for Consultation:     GI bleed  Date of Admission:  08/09/2019 Date of Consultation:  08/10/2019         HPI:   Albert [name] is a 86 y.o. male who was just discharged from the hospital on 8/30.  The patient was now admitted with a report of passing some blood from his rectum, septic shock and a possible GI bleed.  The patient had undergone an EGD and colonoscopy on July 31 with a finding of diverticulosis on the colonoscopy and a duodenal ulcer and esophagitis with an esophageal ulcer.  The patient was treated and discharged.  He reports that he was told by his caregiver that he had GI bleeding again and was brought to the ER.  The patient denies passing any further bowel movements and denies any abdominal pain at the present time.  He states that he also has not had any nausea vomiting or hematemesis.  The patient's hemoglobin has shown:  Component     Latest Ref Rng & Units 08/07/2019 08/09/2019 08/09/2019 08/09/2019          4:24 PM  5:27 PM  8:57 PM  Hemoglobin     13.0 - 17.0 g/dL 8.3 (L) 9.7 (L) 8.9 (L) 7.5 (L)  HCT     39 - 52 % 24.9 (L) 30.4 (L) 28.0 (L) 23.4 (L)   Component     Latest Ref Rng & Units 08/10/2019 08/10/2019 08/10/2019        12:18 AM  5:00 AM 11:30 AM  Hemoglobin     13.0 - 17.0 g/dL 8.1 (L) 8.2 (L) 8.1 (L)  HCT     39 - 52 % 25.3 (L) 24.4 (L) 25.4 (L)   Without any blood transfusions.  The patient states that he was given multiple blood transfusions at his last admission.  It had been reported the patient was taking multiple NSAIDs.  There is also some concern of possible coarseness of the liver consistent with cirrhosis.  The patient's liver enzymes were normal except for a slightly elevated  total bilirubin.  His platelets were normal.  Past Medical History:  Diagnosis Date  . Actinic keratosis 04/23/2008   L elbow distal tricep (bx proven)  . Basal cell carcinoma 06/17/2006   L preauricular   . Basal cell carcinoma 05/26/2012   L ant neck   . Basal cell carcinoma 11/02/2012   Glabella - excision   . Basal cell carcinoma 08/15/2014   L med ankle   . Basal cell carcinoma 02/23/2017   R lat infrapectoral - excision  . Basal cell carcinoma 12/08/2017   L mid to distal lat volar forearm   . Hypertension   . Squamous cell carcinoma of skin 02/14/2014   R lat low back - ED&C  . Squamous cell carcinoma of skin 10/02/2014   R prox lat bicep - ED&C    Past Surgical History:  Procedure Laterality Date  . COLONOSCOPY N/A 08/05/2019   Procedure: COLONOSCOPY;  Surgeon: Toledo, Benay Pike, MD;  Location: [facility] ENDOSCOPY;  Service: Gastroenterology;  Laterality: N/A;  . ESOPHAGOGASTRODUODENOSCOPY N/A 08/05/2019   Procedure: ESOPHAGOGASTRODUODENOSCOPY (EGD);  Surgeon: Alice Reichert, Benay Pike, MD;  Location: [facility] ENDOSCOPY;  Service: Gastroenterology;  Laterality: N/A;    Prior to Admission medications   Medication Sig Start Date End Date Taking? Authorizing Provider  aspirin EC 81 MG tablet Take 1 tablet (81 mg total) by mouth daily. Swallow whole.start 08/19/19 08/08/19 08/07/20 Yes Georgette Shell, MD  atorvastatin (LIPITOR) 20 MG tablet Take 20 mg by mouth daily. 11/20/18  Yes [provider]  Multiple Vitamin (MULTIVITAMIN WITH MINERALS) TABS tablet Take 1 tablet by mouth daily. 08/09/19  Yes Georgette Shell, MD  ondansetron (ZOFRAN) 4 MG tablet Take 1 tablet (4 mg total) by mouth every 6 (six) hours as needed for nausea. 08/08/19  Yes Georgette Shell, MD  pantoprazole (PROTONIX) 40 MG tablet Take 1 tablet (40 mg total) by mouth daily. 08/09/19  Yes Georgette Shell, MD    History reviewed. No pertinent family history.   Social History   Tobacco Use  . Smoking  status: Never Smoker  . Smokeless tobacco: Never Used  Vaping Use  . Vaping Use: Never used  Substance Use Topics  . Alcohol use: Not Currently  . Drug use: Never    Allergies as of 08/09/2019 - Review Complete 08/09/2019  Allergen Reaction Noted  . Penicillins  08/04/2019    Review of Systems:    All systems reviewed and negative except where noted in HPI.   Physical Exam:  Vital signs in last 24 hours: Temp:  [98.4 F (36.9 C)-98.6 F (37 C)] 98.6 F (37 C) (08/04 2150) Pulse Rate:  [65-109] 82 (08/05 1710) Resp:  [11-24] 14 (08/05 1710) BP: (81-125)/(47-97) 95/53 (08/05 1700) SpO2:  [66 %-100 %] 99 % (08/05 1710)   General:   Pleasant, cooperative in NAD Head:  Normocephalic and atraumatic. Eyes:   No icterus.   Conjunctiva pink. PERRLA. Ears:  Normal auditory acuity. Neck:  Supple; no masses or thyroidomegaly Lungs: Respirations even and unlabored. Lungs clear to auscultation bilaterally.   No wheezes, crackles, or rhonchi.  Heart:  Regular rate and rhythm;  Without murmur, clicks, rubs or gallops Abdomen:  Soft, nondistended, nontender. Normal bowel sounds. No appreciable masses or hepatomegaly.  No rebound or guarding.  Rectal:  Not performed. Msk:  Symmetrical without gross deformities.   Extremities:  Without edema, cyanosis or clubbing. Neurologic:  Alert and oriented x3;  grossly normal neurologically. Skin:  Intact without significant lesions or rashes. Cervical Nodes:  No significant cervical adenopathy. Psych:  Alert and cooperative. Normal affect.  LAB RESULTS: Recent Labs    08/09/19 1727 08/09/19 1727 08/09/19 2057 08/09/19 2057 08/10/19 0018 08/10/19 0500 08/10/19 1130  WBC 25.5*  --  19.5*  --   --  17.1*  --   HGB 8.9*   < > 7.5*   < > 8.1* 8.2* 8.1*  HCT 28.0*   < > 23.4*   < > 25.3* 24.4* 25.4*  PLT 208  --  145*  --   --  151  --    < > = values in this interval not displayed.   BMET Recent Labs    08/09/19 1624 08/09/19 1727  08/10/19 0500  NA 138 140 139  K 4.3 4.8 4.1  CL 106 107 110  CO2 20* 21* 20*  GLUCOSE 100* 96 108*  BUN 43* 44* 41*  CREATININE 1.43* 1.54* 1.28*  CALCIUM 7.9* 7.8* 7.3*   LFT Recent Labs    08/10/19 0500  PROT 4.3*  ALBUMIN 1.9*  AST 30  ALT 16  ALKPHOS 68  BILITOT 1.4*   PT/INR Recent Labs    08/09/19 1727  LABPROT 13.1  INR 1.0    STUDIES: DG Chest Port 1 View  Result Date: 08/09/2019 CLINICAL DATA:  Sepsis. EXAM: PORTABLE CHEST 1 VIEW COMPARISON:  August 03, 2019 FINDINGS: There is a new small right-sided pleural effusion. There are bibasilar airspace opacities favored to represent areas of atelectasis. The heart size is unchanged. There are aortic calcifications. There is no pneumothorax. IMPRESSION: 1. New small right-sided pleural effusion. 2. Bibasilar airspace opacities favored to represent areas of atelectasis. Electronically Signed   By: Constance Holster M.D.   On: 08/09/2019 17:32   CT Angio Abd/Pel W and/or Wo Contrast  Addendum Date: 08/09/2019   ADDENDUM REPORT: 08/09/2019 19:58 ADDENDUM: Additional impression point: Gallbladder distention is a nonspecific finding in the absence of inflammatory change. Chronic cholecystitis/gallbladder dysfunction as well as occasionally acute cholecystitis can present with this appearance. Recommend correlation with right upper quadrant symptoms and labs. Could consider further evaluation with right upper quadrant ultrasound. Addendum by telephone at the time of submission on 08/09/2019 at 7:57 pm to provider [facility] , who verbally acknowledged these results. Electronically Signed   By: Lovena Le M.D.   On: 08/09/2019 19:58   Result Date: 08/09/2019 CLINICAL DATA:  GI bleed, recent discharge for same EXAM: CTA ABDOMEN AND PELVIS WITHOUT AND WITH CONTRAST TECHNIQUE: Multidetector CT imaging of the abdomen and pelvis was performed using the standard protocol during bolus administration of intravenous contrast. Multiplanar  reconstructed images and MIPs were obtained and reviewed to evaluate the vascular anatomy. CONTRAST:  84mL OMNIPAQUE IOHEXOL 350 MG/ML SOLN COMPARISON:  Renal ultrasound 08/04/2019, CT angio chest 11/22/2018, abdominal ultrasound 08/19/2011 FINDINGS: VASCULAR Aorta: Calcified noncalcified atheromatous plaque present throughout the native abdominal aorta. No acute luminal abnormality, periaortic stranding or hemorrhage is seen. No aneurysm or ectasia. Celiac: Ostial plaque at the celiac axis origin results in some mild ostial narrowing which is exacerbated by slightly hook like configuration implicating some compression by the median arcuate ligament. There is post stenotic dilatation up to 10 mm in size. Minimal plaque is present in the splenic artery branch as well. No other significant stenosis is seen however. No acute luminal abnormality such as dissection, aneurysm or features of vasculitis. SMA: Mild ostial plaque narrowing at the SMA origin with calcified and noncalcified plaque in the proximal SMA resulting in at most mild multifocal narrowings of the proximal vessel. Vessel otherwise normally opacified without acute luminal abnormality or features of vasculitis. Renals: Single renal arteries bilaterally. Ostial plaque results in mild narrowing on the right and moderate narrowing on the left. No evidence of aneurysm, dissection, vasculitis or features of fibromuscular dysplasia. IMA: Ostial plaque resulting in severe narrowing of the IMA artery origin. Vessel is otherwise opacified normally without significant stenosis. No acute luminal abnormality. No features of vasculitis. Inflow: Extensive calcific plaque throughout the common, internal external iliac arteries which does result in some mild segmental narrowing of the proximal internal iliac arteries bilaterally. Proximal branches are otherwise normally opacified. Proximal Outflow: Plaque seen throughout the proximal outflow vessels including the  bilateral common femoral and visualized superficial femoral and deep femoral arteries. Results in at least some mild narrowing at the right deep femoral origin. No other significant stenosis or acute luminal abnormality is seen. No aneurysm or ectasia. Veins: No acute abnormality of the major venous branches seen on venous or arterial phase imaging. Portal and hepatic veins are both filled well opacified  and free of acute abnormality. Review of the MIP images confirms the above findings. NON-VASCULAR Lower chest: Small bilateral pleural effusions with some adjacent passive atelectasis which appears to enhance uniformly. Mild basilar bronchitic changes are present. Mild cardiomegaly with biatrial enlargement and three-vessel coronary artery disease as well as extensive calcification of the aortic leaflets and mitral valve annulus. Hepatobiliary: Punctate likely benign calcification in the caudate, could reflect sequela of prior granulomatous disease. No concerning liver lesions. Normal liver attenuation. Smooth surface contour. Marked distension of the gallbladder without visible calcified gallstone within the gallbladder lumen or biliary tree. No pericholecystic fluid or inflammation. No biliary ductal dilatation. Pancreas: Moderate pancreatic atrophy. No pancreatic inflammation, discernible lesion or ductal dilatation. Spleen: Normal arterial and venous enhancement patterns of the spleen. Splenic size is upper limits normal. No focal splenic lesion. Adrenals/Urinary Tract: Mild lobular thickening of the adrenal glands may reflect some senescent adrenal hyperplasia. Kidneys enhance symmetrically. Calcification in the interpolar right kidney, previously suspected to reflect a renal calculus more accurately reflects a peripherally calcified, partially exophytic cyst (Bosniak IIF). No other focal concerning renal lesions. Mild circumferential thickening of the bladder wall with perivesicular hazy stranding and  indentation of bladder base by an enlarged prostate. Stomach/Bowel: Small amount of posterior layering hyperdensity within a left upper quadrant small bowel loop likely reflect ingested material given the presence on precontrast examination. No abnormal sites of contrast blush or accumulation are seen to suggest a discernible site of active bleeding. Small sliding-type hiatal hernia. Distal stomach is unremarkable. Small duodenal lipoma (7/88) as well as two air-filled duodenal diverticula measuring up to 3.7 cm (7/80, 7/67) no small bowel thickening or dilatation. Appendix is not visualized. No focal inflammation the vicinity of the cecum to suggest an occult appendicitis. No colonic dilatation or wall thickening. Portion of the sigmoid colon protrudes into an indirect left inguinal hernia without convincing evidence of vascular compromise of the bowel wall or resulting mechanical obstruction. Into a scattered colonic diverticula without focal inflammation to suggest diverticulitis. 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 No pathologically enlarged lymph nodes. Reproductive: Mild prostatomegaly. No focal concerning abnormality of the prostate or seminal vesicles. Other: Diffuse circumferential body wall edema most pronounced posteriorly. Fat and bowel containing left inguinal hernia. Small fat containing right inguinal hernia as well. No abdominopelvic free air or fluid. Musculoskeletal: Multilevel degenerative changes are present in the imaged portions of the spine. 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 Additional degenerative changes of the hips and pelvis including partial ankylosis of the SI joints. IMPRESSION: 1. No abnormal sites of  contrast blush or accumulation are seen to suggest a discernible site of active bleeding. If there is persisting concern for slow bleed or intermittent bleeding, nuclear medicine bleeding scan could be performed. 2. Duodenal and colonic diverticulosis without evidence of active diverticular inflammation, can present with GI bleeding in the absence of inflammation. 3. Aortic Atherosclerosis (ICD10-I70.0). Multilevel splanchnic and renal artery stenoses secondary to atheromatous disease, as detailed level by level above. 4. Hook-like configuration of the celiac axis could reflect some compression by the median arcuate ligament. Mild poststenotic dilatation. 5. Portion of the sigmoid colon protrudes into an indirect left inguinal hernia without convincing evidence of vascular compromise of the bowel wall or resulting mechanical obstruction. 6. 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 7. Mild circumferential thickening of the bladder wall with perivesicular hazy stranding and indentation of bladder base by an enlarged prostate. Findings are suggestive of cystitis and/or chronic outlet obstruction. Correlate with urinalysis. 8. Cardiomegaly with biatrial enlargement and three-vessel coronary artery disease. 9. Features suggestive of anasarca with body wall edema and bilateral pleural effusions. 10. Thick peripheral calcifications of the partially exophytic interpolar right renal cyst measuring up to 10 mm. Recommend renal protocol MRI at 6 and 12 months then yearly for 5 years. This recommendation follows ACR consensus guidelines: Management of the Incidental Renal Mass on CT: A White Paper of the ACR Incidental Findings Committee. J Am Coll Radiol [phone]. Electronically Signed: By: Lovena Le M.D. On: 08/09/2019 19:17   US Abdomen Limited RUQ  Result Date: 08/09/2019 CLINICAL DATA:  Gallbladder disease, abnormal CT EXAM: ULTRASOUND ABDOMEN LIMITED RIGHT UPPER  QUADRANT COMPARISON:  CT 08/09/2019 FINDINGS: Gallbladder: Gallbladder appears diffusely distended with heterogeneous mixture of layering biliary sludge. No visible shadowing gallstones are present. No significant gallbladder wall thickening. Sonographic Percell Miller sign is reportedly negative. Common bile duct: Diameter: 4.8 mm, nondilated Liver: Suboptimal visualization of the left lobe liver due to patient body habitus. Diffusely coarsened hepatic echotexture without visible focal lesion. Portal vein is patent on color Doppler imaging with normal direction of blood flow towards the liver. Other: None. IMPRESSION: Mildly coarsened liver echotexture is nonspecific. Could correlate with LFTs. Gallbladder distention with extensive biliary sludge. No visible calcified gallstones. No additional supporting features of acute cholecystitis however distention is a nonspecific finding that can reflect gallbladder dysfunction/chronic cholecystitis. Could consider outpatient evaluation with HIDA scan with gallbladder ejection fraction. Electronically Signed   By: Lovena Le M.D.   On: 08/09/2019 21:11      Impression / Plan:   Assessment: Active Problems:   GI bleed   GIB (gastrointestinal bleeding)   MAVIN DYKE is a 86 y.o. y/o male with a recent upper GI bleed with a small intestines and colon filled with old blood from his duodenal ulcer bleed.  This can take more than a week to completely clear out and his hemoglobin has been stable.  His hypotension is unlikely from a GI bleed with his reported no further passage of stool and his stable hemoglobin.  Plan:  The patient will continue to be treated supportively and there is no plan to do any GI intervention at this time.  The patient should have his hemoglobin monitored and treated with a PPI.  At the present time the patient reports that he has no abdominal pain.  I will continue to follow the patient along with you.  Thank you for involving me in the  care of this patient.      LOS: 1 day   Albert Lame, MD  08/10/2019, 5:33 PM Pager [phone] 7am-5pm  Check AMION for 5pm -7am coverage and on weekends   Note: This dictation was prepared with Dragon dictation along with smaller phrase technology. Any transcriptional errors that result from this process are unintentional.

## 2019-08-10 NOTE — ED Notes (Signed)
Sacral pad placed, depends, bed sheets and gown changed and pts skin wiped with bath wipes. Pt rolled to the left side with pillows to give skin on back a break. Skin is red at this time with breakdown to pts right glute. This is not new and was dressed upon pts arrival.

## 2019-08-10 NOTE — Progress Notes (Signed)
PHARMACY - PHYSICIAN COMMUNICATION CRITICAL VALUE ALERT - BLOOD CULTURE IDENTIFICATION (BCID)  Albert [name] is an 86 y.o. male who presented to [facility] on 08/09/2019 with a chief complaint of GI bleed and sepsis of unknown etiology  Assessment:  1/4 bottles GPC. BCID detected Staphylococcus epidermidis, MecA not detected (could reflect contamination)  Name of physician (or Provider) Contacted: Dr. Mortimer Fries  Current antibiotics: Vancomycin/cefepime/metronidazole  Changes to prescribed antibiotics recommended:  Patient is on recommended antibiotics - No changes needed  Results for orders placed or performed during the hospital encounter of 08/09/19  Blood Culture ID Panel (Reflexed) (Collected: 08/09/2019  5:28 PM)  Result Value Ref Range   Enterococcus faecalis NOT DETECTED NOT DETECTED   Enterococcus Faecium NOT DETECTED NOT DETECTED   Listeria monocytogenes NOT DETECTED NOT DETECTED   Staphylococcus species DETECTED (A) NOT DETECTED   Staphylococcus aureus (BCID) NOT DETECTED NOT DETECTED   Staphylococcus epidermidis DETECTED (A) NOT DETECTED   Staphylococcus lugdunensis NOT DETECTED NOT DETECTED   Streptococcus species NOT DETECTED NOT DETECTED   Streptococcus agalactiae NOT DETECTED NOT DETECTED   Streptococcus pneumoniae NOT DETECTED NOT DETECTED   Streptococcus pyogenes NOT DETECTED NOT DETECTED   A.calcoaceticus-baumannii NOT DETECTED NOT DETECTED   Bacteroides fragilis NOT DETECTED NOT DETECTED   Enterobacterales NOT DETECTED NOT DETECTED   Enterobacter cloacae complex NOT DETECTED NOT DETECTED   Escherichia coli NOT DETECTED NOT DETECTED   Klebsiella aerogenes NOT DETECTED NOT DETECTED   Klebsiella oxytoca NOT DETECTED NOT DETECTED   Klebsiella pneumoniae NOT DETECTED NOT DETECTED   Proteus species NOT DETECTED NOT DETECTED   Salmonella species NOT DETECTED NOT DETECTED   Serratia marcescens NOT DETECTED NOT DETECTED   Haemophilus influenzae NOT DETECTED NOT DETECTED    Neisseria meningitidis NOT DETECTED NOT DETECTED   Pseudomonas aeruginosa NOT DETECTED NOT DETECTED   Stenotrophomonas maltophilia NOT DETECTED NOT DETECTED   Candida albicans NOT DETECTED NOT DETECTED   Candida auris NOT DETECTED NOT DETECTED   Candida glabrata NOT DETECTED NOT DETECTED   Candida krusei NOT DETECTED NOT DETECTED   Candida parapsilosis NOT DETECTED NOT DETECTED   Candida tropicalis NOT DETECTED NOT DETECTED   Cryptococcus neoformans/gattii NOT DETECTED NOT DETECTED   Methicillin resistance mecA/C NOT DETECTED NOT DETECTED   Benita Gutter 08/10/2019  6:49 PM

## 2019-08-11 DIAGNOSIS — R6520 Severe sepsis without septic shock: Secondary | ICD-10-CM

## 2019-08-11 DIAGNOSIS — K264 Chronic or unspecified duodenal ulcer with hemorrhage: Secondary | ICD-10-CM

## 2019-08-11 DIAGNOSIS — A419 Sepsis, unspecified organism: Principal | ICD-10-CM

## 2019-08-11 DIAGNOSIS — I959 Hypotension, unspecified: Secondary | ICD-10-CM

## 2019-08-11 LAB — COMPREHENSIVE METABOLIC PANEL
ALT: 16 U/L (ref 0–44)
AST: 27 U/L (ref 15–41)
Albumin: 1.9 g/dL — ABNORMAL LOW (ref 3.5–5.0)
Alkaline Phosphatase: 95 U/L (ref 38–126)
Anion gap: 7 (ref 5–15)
BUN: 35 mg/dL — ABNORMAL HIGH (ref 8–23)
CO2: 20 mmol/L — ABNORMAL LOW (ref 22–32)
Calcium: 7 mg/dL — ABNORMAL LOW (ref 8.9–10.3)
Chloride: 113 mmol/L — ABNORMAL HIGH (ref 98–111)
Creatinine, Ser: 1.39 mg/dL — ABNORMAL HIGH (ref 0.61–1.24)
GFR calc Af Amer: 54 mL/min — ABNORMAL LOW (ref >60)
GFR calc non Af Amer: 46 mL/min — ABNORMAL LOW (ref >60)
Glucose, Bld: 120 mg/dL — ABNORMAL HIGH (ref 70–99)
Potassium: 3.8 mmol/L (ref 3.5–5.1)
Sodium: 140 mmol/L (ref 135–145)
Total Bilirubin: 1.1 mg/dL (ref 0.3–1.2)
Total Protein: 4.3 g/dL — ABNORMAL LOW (ref 6.5–8.1)

## 2019-08-11 LAB — CBC
HCT: 24.7 % — ABNORMAL LOW (ref 39.0–52.0)
Hemoglobin: 8.3 g/dL — ABNORMAL LOW (ref 13.0–17.0)
MCH: 30.1 pg (ref 26.0–34.0)
MCHC: 33.6 g/dL (ref 30.0–36.0)
MCV: 89.5 fL (ref 80.0–100.0)
Platelets: 159 10*3/uL (ref 150–400)
RBC: 2.76 MIL/uL — ABNORMAL LOW (ref 4.22–5.81)
RDW: 16.2 % — ABNORMAL HIGH (ref 11.5–15.5)
WBC: 11.3 10*3/uL — ABNORMAL HIGH (ref 4.0–10.5)
nRBC: 0 % (ref 0.0–0.2)

## 2019-08-11 LAB — URINE CULTURE: Culture: NO GROWTH

## 2019-08-11 LAB — CULTURE, BLOOD (ROUTINE X 2)

## 2019-08-11 LAB — HEMOGLOBIN AND HEMATOCRIT, BLOOD
HCT: 25 % — ABNORMAL LOW (ref 39.0–52.0)
Hemoglobin: 8.3 g/dL — ABNORMAL LOW (ref 13.0–17.0)

## 2019-08-11 LAB — PROCALCITONIN: Procalcitonin: 0.33 ng/mL

## 2019-08-11 MED ORDER — SODIUM CHLORIDE 0.9 % IV SOLN: INTRAVENOUS | Status: DC

## 2019-08-11 MED ORDER — ENSURE ENLIVE PO LIQD
237.0000 mL | Freq: Three times a day (TID) | ORAL | Status: DC
  Administered 2019-08-11 – 2019-08-14 (×5): 237 mL via ORAL

## 2019-08-11 NOTE — Consult Note (Signed)
Pharmacy Antibiotic Note  Albert [name] is a 86 y.o. male with medical history including CKD stage IIIa admitted on 08/09/2019 with sepsis. Patient was recently admitted 7/29 - 8/3 for GIB. Pharmacy has been consulted for vancomycin and cefepime dosing. Patient is also ordered metronidazole.   8/6: Vancomycin Discontinued   Plan: Day 3 IV antibiotics. Continue Cefepime 2 g q12h    Height: 5\' 4"  (162.6 cm) Weight: 62.1 kg (136 lb 14.5 oz) IBW/kg (Calculated) : 59.2  Temp (24hrs), Avg:98.2 F (36.8 C), Min:97.4 F (36.3 C), Max:98.5 F (36.9 C)  Recent Labs  Lab 08/07/19 1418 08/08/19 0448 08/09/19 1624 08/09/19 1727 08/09/19 1911 08/09/19 2057 08/09/19 2230 08/10/19 0123 08/10/19 0500 08/11/19 0422  WBC   < >  --  28.0* 25.5*  --  19.5*  --   --  17.1* 11.3*  CREATININE  --  1.46* 1.43* 1.54*  --   --   --   --  1.28* 1.39*  LATICACIDVEN  --   --   --  4.0* 4.2*  --  3.0* 1.2  --   --    < > = values in this interval not displayed.    Estimated Creatinine Clearance: 33.1 mL/min (A) (by C-G formula based on SCr of 1.39 mg/dL (H)).    Allergies  Allergen Reactions  . Penicillins     POA reports penicillin as allergy but unsure of reaction    Antimicrobials this admission: Vancomycin 8/4 >>  8/6 Cefepime 8/4 >>  Metronidazole 8/4 >>   Dose adjustments this admission: n/a  Microbiology results: 8/4 BCx:  1 of 4 GPC  BCID: Staphylococcus epidermidis 8/4 UCx: NG Final  8/4 MRSA PCR: negative   Thank you for allowing pharmacy to be a part of this patient's care.  Pernell Dupre, PharmD, BCPS Clinical Pharmacist 08/11/2019 3:50 PM

## 2019-08-11 NOTE — NC FL2 (Signed)
[place] LEVEL OF CARE SCREENING TOOL     IDENTIFICATION  Patient Name: Albert [name] Birthdate: 1933-10-30 Sex: male Admission Date (Current Location): 08/09/2019  [place] and [place] Number:  Engineering geologist and Address:  [facility], [address] [place], [place] [address]      Provider Number: [id number]  Attending Physician Name and Address:  Alma Friendly, MD  Relative Name and Phone Number:       Current Level of Care: Hospital Recommended Level of Care: [place] Prior Approval Number:    Date Approved/Denied:   PASRR Number: [id number]  Discharge Plan: SNF    Current Diagnoses: Patient Active Problem List   Diagnosis Date Noted  . GIB (gastrointestinal bleeding) 08/10/2019  . GI bleed 08/09/2019  . Acute GI bleeding 08/04/2019  . Symptomatic anemia 08/04/2019  . Coagulopathy ([place]) 08/04/2019  . Metabolic acidosis 60/63/0160  . SIRS (systemic inflammatory response syndrome) ([place]) 08/04/2019  . UGI bleed 08/04/2019  . Dehydration 11/24/2018  . CKD (chronic kidney disease), stage III 11/24/2018  . Generalized weakness 11/22/2018  . Acute renal failure superimposed on stage 3a chronic kidney disease ([place])   . Elevated troponin   . Benign essential hypertension 06/15/2013    Orientation RESPIRATION BLADDER Height & Weight     Self, Time, Place, Situation  Normal Continent Weight: 62.1 kg Height:  5\' 4"  (162.6 cm)  BEHAVIORAL SYMPTOMS/MOOD NEUROLOGICAL BOWEL NUTRITION STATUS      Continent Diet (Regular)  AMBULATORY STATUS COMMUNICATION OF NEEDS Skin   Limited Assist   Normal                       Personal Care Assistance Level of Assistance  Dressing, Feeding, Bathing Bathing Assistance: Limited assistance Feeding assistance: Independent Dressing Assistance: Limited assistance     Functional Limitations Info  Sight, Hearing, Speech Sight Info: Adequate Hearing Info:  Impaired Speech Info: Adequate    SPECIAL CARE FACTORS FREQUENCY  PT (By licensed PT), OT (By licensed OT)                    Contractures Contractures Info: Not present    Additional Factors Info  Code Status, Allergies Code Status Info: Full Allergies Info: Penicillins           Current Medications (08/11/2019):  This is the current hospital active medication list Current Facility-Administered Medications  Medication Dose Route Frequency Provider Last Rate Last Admin  . 0.9 %  sodium chloride infusion   Intravenous Continuous Alma Friendly, MD      . ceFEPIme (MAXIPIME) 2 g in sodium chloride 0.9 % 100 mL IVPB  2 g Intravenous Q12H Benita Gutter, [facility]   Stopped at 08/11/19 1093  . docusate sodium (COLACE) capsule 100 mg  100 mg Oral BID PRN Darel Hong D, NP      . metroNIDAZOLE (FLAGYL) IVPB 500 mg  500 mg Intravenous Q8H Darel Hong D, NP 100 mL/hr at 08/11/19 0142 500 mg at 08/11/19 0142  . octreotide (SANDOSTATIN) 500 mcg in sodium chloride 0.9 % 250 mL (2 mcg/mL) infusion  50 mcg/hr Intravenous Continuous Lucrezia Starch, MD 25 mL/hr at 08/11/19 2355 50 mcg/hr at 08/11/19 0642  . ondansetron (ZOFRAN) injection 4 mg  4 mg Intravenous Q6H PRN Darel Hong D, NP      . pantoprazole (PROTONIX) 80 mg in sodium chloride 0.9 % 100 mL (0.8 mg/mL) infusion  8 mg/hr Intravenous Continuous Darel Hong D, NP 10 mL/hr at 08/11/19 1610 8 mg/hr at 08/11/19 0642  . [START ON 08/13/2019] pantoprazole (PROTONIX) injection 40 mg  40 mg Intravenous Q12H Darel Hong D, NP      . polyethylene glycol (MIRALAX / GLYCOLAX) packet 17 g  17 g Oral Daily PRN Bradly Bienenstock, NP      . vancomycin (VANCOREADY) IVPB 750 mg/150 mL  750 mg Intravenous Q24H Benita Gutter [place] at 08/11/19 9604     Discharge Medications: Please see discharge summary for a list of discharge medications.  Relevant Imaging Results:  Relevant Lab Results:   Additional  Information SSN: [id number]  Shelbie Ammons, RN

## 2019-08-11 NOTE — TOC Initial Note (Signed)
Transition of Care [facility]) - Initial/Assessment Note    Patient Details  Name: Albert [name] MRN: [id number] Date of Birth: [birth year]  [facility] CM/SW Contact:    Shelbie Ammons, RN Phone Number: 08/11/2019, 10:06 AM  Clinical Narrative:   RNCM met with patient at bedside. Patient reports that he made a mistake going home. He had been in rehab at the end of last year and did not really want to go again but now he realizes that he needs to go again before he goes home. He reports that when he went last year to [facility] it was a good experience, he said when he left he was able to move around as he needed and get his chores done. Patient reports he would like to go back to [facility] but is agreeable to a search of all facilities to see who has a bed available. RNCM verified PASSR, completed FL-2 and sent for signature and initiated bed search.             Expected Discharge Plan: Skilled Nursing Facility Barriers to Discharge: No Barriers Identified   Patient Goals and CMS Choice     Choice offered to / list presented to : Patient  Expected Discharge Plan and Services Expected Discharge Plan: [place]   Discharge Planning Services: CM Consult   Living arrangements for the past 2 months: Single Family Home                                      Prior Living Arrangements/Services Living arrangements for the past 2 months: Single Family Home Lives with:: Self Patient language and need for interpreter reviewed:: Yes Do you feel safe going back to the place where you live?: Yes      Need for Family Participation in Patient Care: Yes (Comment) Care giver support system in place?: Yes (comment)   Criminal Activity/Legal Involvement Pertinent to Current Situation/Hospitalization: No - Comment as needed  Activities of Daily Living Home Assistive Devices/Equipment: Walker (specify type) ADL Screening (condition at time of admission) Patient's  cognitive ability adequate to safely complete daily activities?: Yes Is the patient deaf or have difficulty hearing?: No Does the patient have difficulty seeing, even when wearing glasses/contacts?: No Does the patient have difficulty concentrating, remembering, or making decisions?: No Patient able to express need for assistance with ADLs?: No Does the patient have difficulty dressing or bathing?: No Independently performs ADLs?: Yes (appropriate for developmental age) Does the patient have difficulty walking or climbing stairs?: No Weakness of Legs: None Weakness of Arms/Hands: None  Permission Sought/Granted Permission sought to share information with : Family Supports    Share Information with NAME: Darlene           Emotional Assessment Appearance:: Appears stated age Attitude/Demeanor/Rapport: Engaged Affect (typically observed): Calm, Appropriate Orientation: : Oriented to Self, Oriented to Place, Oriented to  Time, Oriented to Situation Alcohol / Substance Use: Not Applicable Psych Involvement: No (comment)  Admission diagnosis:  Hypomagnesemia [E83.42] Gall bladder disease [K82.9] GI bleed [K92.2] GIB (gastrointestinal bleeding) [K92.2] Lactic acid acidosis [E87.2] Troponin I above reference range [R77.8] Septic shock (HCC) [A41.9, R65.21] Gastrointestinal hemorrhage, unspecified gastrointestinal hemorrhage type [K92.2] Patient Active Problem List   Diagnosis Date Noted  . GIB (gastrointestinal bleeding) 08/10/2019  . GI bleed 08/09/2019  . Acute GI bleeding 08/04/2019  . Symptomatic anemia 08/04/2019  . Coagulopathy (  [place]) 08/04/2019  . Metabolic acidosis 86/16/8372  . SIRS (systemic inflammatory response syndrome) ([place]) 08/04/2019  . UGI bleed 08/04/2019  . Dehydration 11/24/2018  . CKD (chronic kidney disease), stage III 11/24/2018  . Generalized weakness 11/22/2018  . Acute renal failure superimposed on stage 3a chronic kidney disease ([place])   . Elevated  troponin   . Benign essential hypertension 06/15/2013   PCP:  [name], MD Pharmacy:   [facility] [place], [place] [address] [place] [address] Phone: [phone] Fax: [phone]     Social Determinants of Health (SDOH) Interventions    Readmission Risk Interventions No flowsheet data found.

## 2019-08-11 NOTE — Progress Notes (Signed)
PROGRESS NOTE  Albert [name] TMA:[id number] DOB: 1933/04/06 DOA: 08/09/2019 PCP: [name], MD  HPI/Recap of past 24 hours: HPI from Dr [name] is a 86 year old male with a past medical history significant for HTN, CKD stage III, chronic pain, COVID-19 in 11/2018, and recent admission 08/03/19 to 08/08/2019 for UGI bleed (found to have esophageal and duodenal ulcer, no active bleeding on EGD performed 08/05/2019) presents to [facility] on 08/09/2019 due to weakness and melena.  Per patient report, his home health aide was concerned about dark black stools in his diapers. In the ED he was noted to be tachycardic with otherwise stable vital signs.  Initial work-up in the ED revealed WBC 28, lactic acid 4, hemoglobin 9.7, high-sensitivity troponin 43, bicarb 20, creatinine 1.43, BUN 43. CXR with right pleural effusion and bilateral airspace opacities concerning for atelectasis. CTA abdomen and pelvis with no discernible site of active bleeding, diverticulosis, questionable mesenteritis, questionable cystitis, and distended gallbladder.  RUQ Abdominal US showed extensive biliary sludge, but no evidence of gallstones or cholecystitis. COVID-19 PCR is negative.  He was given broad spectrum antibiotics, placed on Octreotide and Protonix drip, and received 1 unit pRBC's.  While in the ED, pt was noted to be hypotensive. PCCM consulted and pt was transferred to the ICU. Pt didn't require any pressors. TRH assumed care on 08/11/19.     Today, patient denies any new complaints, still reports generalized weakness, but denies any melena, hematochezia, hematemesis, chest pain, shortness of breath, cough, abdominal pain, diarrhea.    Assessment/Plan: Active Problems:   GI bleed   GIB (gastrointestinal bleeding)  Possible GI bleed Recent admission for GI bleed and anemia on 08/03/2019 Currently melena likely 2/2 old blood from recent GI bleed EGD done on 08/05/2019 showed esophageal ulcer and 2  duodenal ulcers with no stigmata of bleeding CTA abdomen/pelvis done on 08/09/2019 showed no active bleeding, duodenal and colonic diverticulosis without evidence of active diverticular inflammation or bleed GI consulted, no further plans to do any intervention as hemoglobin is remained stable and melena is likely due to passage of old blood from recent bleed Continue PPI Monitor CBC closely  Acute on chronic blood loss anemia Baseline hemoglobin around 9 S/p 1 units of pRBC on 08/09/2019 Monitor closely Daily CBC  ?? Possible severe sepsis criteria on admission ?? Intravascular volume depletion Tachycardia, hypotensive, lactic acidosis, leukocytosis on admission No identifiable source of possible sepsis Currently afebrile, with resolving leukocytosis LA currently WNL Procalcitonin 0.19->0.36->0.33 Blood culture 1/4 bottle growing staph epidermidis, likely contaminant UA with large leukocytes, greater than 50 WBC, UC with no growth although taken after antibiotic was started Chest x-ray with bilateral opacities, more consistent with atelectasis CTA abdomen/pelvis with ?  Mesenteritis, cystitis Right upper quadrant abdominal ultrasound, no evidence of gallstones or cholecystitis Continue cefepime, Flagyl, will DC vancomycin for now (MRSA by PCR negative) Monitor closely  Hypotension History of hypertension Likely part of sepsis phenomenon versus intravascular volume depletion Continue IV fluids, never required pressors Monitor closely  CKD stage IIIa/metabolic acidosis Daily BMP        Malnutrition Type:      Malnutrition Characteristics:      Nutrition Interventions:       Estimated body mass index is 23.5 kg/m as calculated from the following:   Height as of this encounter: 5\' 4"  (1.626 m).   Weight as of this encounter: 62.1 kg.     Code Status: Full  Family Communication: None at bedside  Disposition Plan: Status is: Inpatient  Remains inpatient  appropriate because:Inpatient level of care appropriate due to severity of illness   Dispo: The patient is from: Home              Anticipated d/c is to: SNF              Anticipated d/c date is: 2 days              Patient currently is not medically stable to d/c.    Consultants:  GI  PCCM  Procedures:  None  Antimicrobials:  Cefepime  Flagyl  DVT prophylaxis: SCDs   Objective: Vitals:   08/11/19 0455 08/11/19 0500 08/11/19 0757 08/11/19 1151  BP: (!) 94/47  (!) 100/57 (!) 105/57  Pulse: 70  83 82  Resp: 18  18 16   Temp: 98.4 F (36.9 C)  98.1 F (36.7 C) 98.2 F (36.8 C)  TempSrc: Oral  Oral Oral  SpO2: 100%  99% 100%  Weight:  62.1 kg    Height:        Intake/Output Summary (Last 24 hours) at 08/11/2019 1254 Last data filed at 08/11/2019 1941 Gross per 24 hour  Intake 1383.53 ml  Output 1000 ml  Net 383.53 ml   Filed Weights   08/09/19 1616 08/11/19 0500  Weight: 62 kg 62.1 kg    Exam:  General: NAD, chronically ill-appearing, deconditioned  Cardiovascular: S1, S2 present  Respiratory: CTAB  Abdomen: Soft, nontender, nondistended, bowel sounds present  Musculoskeletal: No bilateral pedal edema noted  Skin: Normal  Psychiatry: Normal mood   Data Reviewed: CBC: Recent Labs  Lab 08/09/19 1624 08/09/19 1624 08/09/19 1727 08/09/19 1727 08/09/19 2057 08/10/19 0018 08/10/19 0500 08/10/19 1130 08/10/19 1744 08/10/19 2348 08/11/19 0422  WBC 28.0*  --  25.5*  --  19.5*  --  17.1*  --   --   --  11.3*  NEUTROABS  --   --  21.8*  --  17.0*  --   --   --   --   --   --   HGB 9.7*   < > 8.9*   < > 7.5*   < > 8.2* 8.1* 8.1* 8.3* 8.3*  HCT 30.4*   < > 28.0*   < > 23.4*   < > 24.4* 25.4* 25.1* 25.0* 24.7*  MCV 93.3  --  93.3  --  93.2  --  88.7  --   --   --  89.5  PLT 219  --  208  --  145*  --  151  --   --   --  159   < > = values in this interval not displayed.   Basic Metabolic Panel: Recent Labs  Lab 08/08/19 0448 08/09/19 1624  08/09/19 1727 08/10/19 0500 08/11/19 0422  NA 139 138 140 139 140  K 4.4 4.3 4.8 4.1 3.8  CL 108 106 107 110 113*  CO2 24 20* 21* 20* 20*  GLUCOSE 108* 100* 96 108* 120*  BUN 46* 43* 44* 41* 35*  CREATININE 1.46* 1.43* 1.54* 1.28* 1.39*  CALCIUM 7.5* 7.9* 7.8* 7.3* 7.0*  MG  --   --  1.5*  --   --    GFR: Estimated Creatinine Clearance: 33.1 mL/min (A) (by C-G formula based on SCr of 1.39 mg/dL (H)). Liver Function Tests: Recent Labs  Lab 08/09/19 1624 08/09/19 1727 08/10/19 0500 08/11/19 0422  AST 47* 47* 30 27  ALT 20 18 16  16  ALKPHOS 90 85 68 95  BILITOT 1.3* 1.3* 1.4* 1.1  PROT 5.6* 5.4* 4.3* 4.3*  ALBUMIN 2.6* 2.6* 1.9* 1.9*   No results for input(s): LIPASE, AMYLASE in the last 168 hours. No results for input(s): AMMONIA in the last 168 hours. Coagulation Profile: Recent Labs  Lab 08/05/19 1121 08/09/19 1727  INR 1.3* 1.0   Cardiac Enzymes: No results for input(s): CKTOTAL, CKMB, CKMBINDEX, TROPONINI in the last 168 hours. BNP (last 3 results) No results for input(s): PROBNP in the last 8760 hours. HbA1C: No results for input(s): HGBA1C in the last 72 hours. CBG: Recent Labs  Lab 08/05/19 0813  GLUCAP 80   Lipid Profile: No results for input(s): CHOL, HDL, LDLCALC, TRIG, CHOLHDL, LDLDIRECT in the last 72 hours. Thyroid Function Tests: No results for input(s): TSH, T4TOTAL, FREET4, T3FREE, THYROIDAB in the last 72 hours. Anemia Panel: No results for input(s): VITAMINB12, FOLATE, FERRITIN, TIBC, IRON, RETICCTPCT in the last 72 hours. Urine analysis:    Component Value Date/Time   COLORURINE YELLOW (A) 08/10/2019 0500   APPEARANCEUR HAZY (A) 08/10/2019 0500   LABSPEC 1.021 08/10/2019 0500   PHURINE 5.0 08/10/2019 0500   GLUCOSEU NEGATIVE 08/10/2019 0500   HGBUR SMALL (A) 08/10/2019 0500   BILIRUBINUR NEGATIVE 08/10/2019 0500   KETONESUR NEGATIVE 08/10/2019 0500   PROTEINUR NEGATIVE 08/10/2019 0500   NITRITE NEGATIVE 08/10/2019 0500    LEUKOCYTESUR LARGE (A) 08/10/2019 0500   Sepsis Labs: @LABRCNTIP (procalcitonin:4,lacticidven:4)  ) Recent Results (from the past 240 hour(s))  SARS Coronavirus 2 by RT PCR (hospital order, performed in [place] hospital lab) Nasopharyngeal Nasopharyngeal Swab     Status: None   Collection Time: 08/03/19 10:53 PM   Specimen: Nasopharyngeal Swab  Result Value Ref Range Status   SARS Coronavirus 2 NEGATIVE NEGATIVE Final    Comment: (NOTE) SARS-CoV-2 target nucleic acids are NOT DETECTED.  The SARS-CoV-2 RNA is generally detectable in upper and lower respiratory specimens during the acute phase of infection. The lowest concentration of SARS-CoV-2 viral copies this assay can detect is 250 copies / mL. A negative result does not preclude SARS-CoV-2 infection and should not be used as the sole basis for treatment or other patient management decisions.  A negative result may occur with improper specimen collection / handling, submission of specimen other than nasopharyngeal swab, presence of viral mutation(s) within the areas targeted by this assay, and inadequate number of viral copies (<250 copies / mL). A negative result must be combined with clinical observations, patient history, and epidemiological information.  Fact Sheet for Patients:   StrictlyIdeas.no  Fact Sheet for Healthcare Providers: BankingDealers.co.za  This test is not yet approved or  cleared by the [place] FDA and has been authorized for detection and/or diagnosis of SARS-CoV-2 by FDA under an Emergency Use Authorization (EUA).  This EUA will remain in effect (meaning this test can be used) for the duration of the COVID-19 declaration under Section 564(b)(1) of the Act, 21 U.S.C. section 360bbb-3(b)(1), unless the authorization is terminated or revoked sooner.  Performed at [facility], [address] [place], [place] [address]   SARS  Coronavirus 2 by RT PCR (hospital order, performed in [facility] hospital lab) Nasopharyngeal Nasopharyngeal Swab     Status: None   Collection Time: 08/09/19  5:27 PM   Specimen: Nasopharyngeal Swab  Result Value Ref Range Status   SARS Coronavirus 2 NEGATIVE NEGATIVE Final    Comment: (NOTE) SARS-CoV-2 target nucleic acids are NOT DETECTED.  The SARS-CoV-2 RNA is generally detectable in upper and lower respiratory specimens during the acute phase of infection. The lowest concentration of SARS-CoV-2 viral copies this assay can detect is 250 copies / mL. A negative result does not preclude SARS-CoV-2 infection and should not be used as the sole basis for treatment or other patient management decisions.  A negative result may occur with improper specimen collection / handling, submission of specimen other than nasopharyngeal swab, presence of viral mutation(s) within the areas targeted by this assay, and inadequate number of viral copies (<250 copies / mL). A negative result must be combined with clinical observations, patient history, and epidemiological information.  Fact Sheet for Patients:   StrictlyIdeas.no  Fact Sheet for Healthcare Providers: BankingDealers.co.za  This test is not yet approved or  cleared by the [place] FDA and has been authorized for detection and/or diagnosis of SARS-CoV-2 by FDA under an Emergency Use Authorization (EUA).  This EUA will remain in effect (meaning this test can be used) for the duration of the COVID-19 declaration under Section 564(b)(1) of the Act, 21 U.S.C. section 360bbb-3(b)(1), unless the authorization is terminated or revoked sooner.  Performed at [facility], [address] [place], [place] [address]   Blood Culture (routine x 2)     Status: None (Preliminary result)   Collection Time: 08/09/19  5:28 PM   Specimen: BLOOD  Result Value Ref Range Status   Specimen  Description BLOOD BLOOD LEFT HAND  Final   Special Requests   Final    BOTTLES DRAWN AEROBIC AND ANAEROBIC Blood Culture results may not be optimal due to an inadequate volume of blood received in culture bottles   Culture   Final    NO GROWTH 2 DAYS Performed at [facility], [address] [place], [place] [address]    Report Status PENDING  Incomplete  Blood Culture (routine x 2)     Status: Abnormal   Collection Time: 08/09/19  5:28 PM   Specimen: BLOOD  Result Value Ref Range Status   Specimen Description   Final    BLOOD LEFT ANTECUBITAL Performed at [facility], [address], [place], [place] [address]    Special Requests   Final    BOTTLES DRAWN AEROBIC AND ANAEROBIC Blood Culture results may not be optimal due to an inadequate volume of blood received in culture bottles Performed at [facility], [address] [place], [place] [address]    Culture  Setup Time   Final    Organism ID to follow Lewis TO, READ BACK BY AND VERIFIED WITH: ALEX CHAPPELL @1729  08/10/19 MJU Performed at [facility], [address] [place], [place] [address]    Culture (A)  Final    STAPHYLOCOCCUS EPIDERMIDIS THE SIGNIFICANCE OF ISOLATING THIS ORGANISM FROM A SINGLE SET OF BLOOD CULTURES WHEN MULTIPLE SETS ARE DRAWN IS UNCERTAIN. PLEASE NOTIFY THE MICROBIOLOGY DEPARTMENT WITHIN ONE WEEK IF SPECIATION AND SENSITIVITIES ARE REQUIRED. Performed at [facility], [place] [address] [place], [place] [address]    Report Status 08/11/2019 FINAL  Final  Blood Culture ID Panel (Reflexed)     Status: Abnormal   Collection Time: 08/09/19  5:28 PM  Result Value Ref Range Status   Enterococcus faecalis NOT DETECTED NOT DETECTED Final   Enterococcus Faecium NOT DETECTED NOT DETECTED Final   Listeria monocytogenes NOT DETECTED NOT DETECTED Final   Staphylococcus species DETECTED (A) NOT  DETECTED Final     Comment: CRITICAL RESULT CALLED TO, READ BACK BY AND VERIFIED WITH: ALEX CHAPPELL @1729  08/10/19 MJU    Staphylococcus aureus (BCID) NOT DETECTED NOT DETECTED Final   Staphylococcus epidermidis DETECTED (A) NOT DETECTED Final    Comment: CRITICAL RESULT CALLED TO, READ BACK BY AND VERIFIED WITH: ALEX CHAPPELL @1729  08/10/19 MJU    Staphylococcus lugdunensis NOT DETECTED NOT DETECTED Final   Streptococcus species NOT DETECTED NOT DETECTED Final   Streptococcus agalactiae NOT DETECTED NOT DETECTED Final   Streptococcus pneumoniae NOT DETECTED NOT DETECTED Final   Streptococcus pyogenes NOT DETECTED NOT DETECTED Final   A.calcoaceticus-baumannii NOT DETECTED NOT DETECTED Final   Bacteroides fragilis NOT DETECTED NOT DETECTED Final   Enterobacterales NOT DETECTED NOT DETECTED Final   Enterobacter cloacae complex NOT DETECTED NOT DETECTED Final   Escherichia coli NOT DETECTED NOT DETECTED Final   Klebsiella aerogenes NOT DETECTED NOT DETECTED Final   Klebsiella oxytoca NOT DETECTED NOT DETECTED Final   Klebsiella pneumoniae NOT DETECTED NOT DETECTED Final   Proteus species NOT DETECTED NOT DETECTED Final   Salmonella species NOT DETECTED NOT DETECTED Final   Serratia marcescens NOT DETECTED NOT DETECTED Final   Haemophilus influenzae NOT DETECTED NOT DETECTED Final   Neisseria meningitidis NOT DETECTED NOT DETECTED Final   Pseudomonas aeruginosa NOT DETECTED NOT DETECTED Final   Stenotrophomonas maltophilia NOT DETECTED NOT DETECTED Final   Candida albicans NOT DETECTED NOT DETECTED Final   Candida auris NOT DETECTED NOT DETECTED Final   Candida glabrata NOT DETECTED NOT DETECTED Final   Candida krusei NOT DETECTED NOT DETECTED Final   Candida parapsilosis NOT DETECTED NOT DETECTED Final   Candida tropicalis NOT DETECTED NOT DETECTED Final   Cryptococcus neoformans/gattii NOT DETECTED NOT DETECTED Final   Methicillin resistance mecA/C NOT DETECTED NOT DETECTED Final    Comment: Performed  at [facility], [address] [place], [place] [address]  MRSA PCR Screening     Status: None   Collection Time: 08/09/19  8:57 PM   Specimen: Nasal Mucosa; Nasopharyngeal  Result Value Ref Range Status   MRSA by PCR NEGATIVE NEGATIVE Final    Comment:        The GeneXpert MRSA Assay (FDA approved for NASAL specimens only), is one component of a comprehensive MRSA colonization surveillance program. It is not intended to diagnose MRSA infection nor to guide or monitor treatment for MRSA infections. Performed at [facility], [address] [place], [place] [address]   Urine culture     Status: None   Collection Time: 08/10/19  5:00 AM   Specimen: In/Out Cath Urine  Result Value Ref Range Status   Specimen Description   Final    IN/OUT CATH URINE Performed at [facility], [address] [place], [place] [address]    Special Requests   Final    NONE Performed at [facility], [address], [place], [place] [address]    Culture   Final    NO GROWTH Performed at [facility], [place] [address], [place], [place] [address]    Report Status 08/11/2019 FINAL  Final      Studies: No results found.  Scheduled Meds: . [START ON 08/13/2019] pantoprazole  40 mg Intravenous Q12H    Continuous Infusions: . sodium chloride 100 mL/hr at 08/11/19 1117  . ceFEPime (MAXIPIME) IV Stopped (08/11/19 1884)  . metronidazole 500 mg (08/11/19 1119)  . octreotide  (SANDOSTATIN)  IV infusion 50 mcg/hr (08/11/19 5051)  . pantoprozole (PROTONIX) infusion 8 mg/hr (08/11/19 8335)  . vancomycin Stopped (08/11/19 8251)     LOS: 2 days     Alma Friendly, MD [facility]  If 7PM-7AM, please contact night-coverage [email or web address] 08/11/2019, 12:54 PM

## 2019-08-11 NOTE — Evaluation (Signed)
Physical Therapy Evaluation Patient Details Name: Albert [name] MRN: [id number] DOB: February 12, 1933 Today's Date: 08/11/2019   History of Present Illness  86 y.o. male who discharged earlier this week, was here for weakness, nausea, and bloody diarrhea.  Pt had been admitted with symptomatic anemia, AKI superimposed on CKD IIIa, metabolic acidosis, coagulopathy, htn, and SIRS, returns with continued GI bleed.  Attempted to return home (despite reccomendations for SNF) but was too weak and now returns with  PMH includes COVID-19 (11/2018), htn, CKD, basal cell carcinoma, htn, CKD.  Clinical Impression  Pt continues to be very weak but was able to do surprisingly well with in-room ambulation.  His ROM limitations are a significant functional limiter at this time but again he was able to exercises with great effort.  Pt is clearly unsafe to return home and is now agreeing to rehab but it was very good to see him show more mobility and function during this eval.     Follow Up Recommendations SNF    Equipment Recommendations  None recommended by PT    Recommendations for Other Services       Precautions / Restrictions Precautions Precautions: Fall Restrictions Weight Bearing Restrictions: No      Mobility  Bed Mobility Overal bed mobility: Needs Assistance Bed Mobility: Supine to Sit;Sit to Supine     Supine to sit: Mod assist Sit to supine: Mod assist;Max assist   General bed mobility comments: Pt showed good effort with each transition but ultimately needed considerable assist with each.  Unable to lift LEs at all back into bed and requesting assist after   Transfers Overall transfer level: Needs assistance Equipment used: Rolling walker (2 wheeled) Transfers: Sit to/from Stand Sit to Stand: Mod assist;From elevated surface         General transfer comment: PT insured L knee was under him as much as possible, elevated bed ~6".  Pt needed less assist than expected (given UE  weakness and lack of ROM in R knee) and was able to stabilize himself relatively quickly with the walker  Ambulation/Gait Ambulation/Gait assistance: Min assist Gait Distance (Feet): 12 Feet Assistive device: Rolling walker (2 wheeled)       General Gait Details: Once upright he was able to maintain balance relatively well during ambulation.  Initially we just took a few side steps along EOB which went very well and we then attempted forward and back 3-5 steps and pt did well and eneded up doing it X 3 with gradually increasing fatigue and HR going from 90s a the start to high 120s by the end.    Stairs            Wheelchair Mobility    Modified Rankin (Stroke Patients Only)       Balance Overall balance assessment: Needs assistance   Sitting balance-Leahy Scale: Good     Standing balance support: Bilateral upper extremity supported Standing balance-Leahy Scale: Fair Standing balance comment: Once in upright pt was able to use walker to maintain standing relatively well                             Pertinent Vitals/Pain Pain Assessment:  (has chronic arthritic pain t/o nearly all joints)    Home Living Family/patient expects to be discharged to:: Skilled nursing facility Living Arrangements: Alone Available Help at Discharge: Family;Available PRN/intermittently Type of Home: House Home Access: Stairs to enter Entrance Stairs-Rails: Psychiatric nurse of  Steps: 1 (3 inch) step up Home Layout: Able to live on main level with bedroom/bathroom (2nd floor bonus room upstairs (does not need to access)) Home Equipment: Walker - 2 wheels;Cane - single point;Shower seat - built in;Grab bars - tub/shower;Grab bars - toilet Additional Comments: Lift recliner    Prior Function Level of Independence: Needs assistance   Gait / Transfers Assistance Needed: Ambulates with SPC vs rollator vs 2WW depending on pt's knee pain  ADL's / Homemaking Assistance  Needed: 3x's per week pt's cousin comes to assist as needed (with groceries, MD appts, etc); pt reports needing significantly more assist for bathing, dressing, and all IADL Tasks the last few months  Comments: No falls in past 5 years     Hand Dominance   Dominant Hand: Right    Extremity/Trunk Assessment   Upper Extremity Assessment Upper Extremity Assessment: Generalized weakness (b/l triceps <3/5, shld elev < 90 b/l)    Lower Extremity Assessment Lower Extremity Assessment: Generalized weakness RLE Deficits / Details: R knee flexion to ~45 (L ~70), R ankle lacks ~2* from neutral, L to 0 but no DF       Communication   Communication: No difficulties  Cognition Arousal/Alertness: Awake/alert Behavior During Therapy: WFL for tasks assessed/performed Overall Cognitive Status: Within Functional Limits for tasks assessed                                        General Comments General comments (skin integrity, edema, etc.): Pt clearly did better than he was able to on very recent admission    Exercises General Exercises - Lower Extremity Ankle Circles/Pumps: AROM;10 reps Heel Slides: Strengthening;10 reps (resisted leg extensions) Hip ABduction/ADduction: Strengthening;10 reps   Assessment/Plan    PT Assessment Patient needs continued PT services  PT Problem List Decreased strength;Decreased range of motion;Decreased activity tolerance;Decreased balance;Decreased mobility;Decreased knowledge of use of DME       PT Treatment Interventions DME instruction;Gait training;Stair training;Functional mobility training;Therapeutic activities;Therapeutic exercise;Balance training;Patient/family education    PT Goals (Current goals can be found in the Care Plan section)  Acute Rehab PT Goals Patient Stated Goal: go to rehab and get stronger before going home safer PT Goal Formulation: With patient Time For Goal Achievement: 08/25/19 Potential to Achieve Goals:  Fair    Frequency Min 2X/week   Barriers to discharge        Co-evaluation               AM-PAC PT "6 Clicks" Mobility  Outcome Measure Help needed turning from your back to your side while in a flat bed without using bedrails?: A Little Help needed moving from lying on your back to sitting on the side of a flat bed without using bedrails?: A Lot Help needed moving to and from a bed to a chair (including a wheelchair)?: A Lot Help needed standing up from a chair using your arms (e.g., wheelchair or bedside chair)?: A Lot Help needed to walk in hospital room?: A Lot Help needed climbing 3-5 steps with a railing? : Total 6 Click Score: 12    End of Session Equipment Utilized During Treatment: Gait belt Activity Tolerance: Patient limited by fatigue Patient left: with call bell/phone within reach;with bed alarm set   PT Visit Diagnosis: Other abnormalities of gait and mobility (R26.89);Muscle weakness (generalized) (M62.81);Difficulty in walking, not elsewhere classified (R26.2)    Time: 1007-1219  PT Time Calculation (min) (ACUTE ONLY): 38 min   Charges:   PT Evaluation $PT Eval Low Complexity: 1 Low PT Treatments $Therapeutic Exercise: 8-22 mins $Therapeutic Activity: 8-22 mins        Kreg Shropshire, DPT 08/11/2019, 4:19 PM

## 2019-08-11 NOTE — Progress Notes (Signed)
Albert Lame, MD [facility]   [address] [place], [place] [address] Phone: [phone] Fax : [phone]   Subjective: The patient has no complaints today and is sitting up in bed eating his food.  He denies any abdominal pain or any rectal bleeding.  The patient was admitted with hypotension and possible sepsis with a presumed GI bleed although it appears from his stable hemoglobin that he was just passing the old blood from his previous admission the day before his recent visit with a duodenal ulcer found at that time.   Objective: Vital signs in last 24 hours: Vitals:   08/11/19 0455 08/11/19 0500 08/11/19 0757 08/11/19 1151  BP: (!) 94/47  (!) 100/57 (!) 105/57  Pulse: 70  83 82  Resp: 18  18 16   Temp: 98.4 F (36.9 C)  98.1 F (36.7 C) 98.2 F (36.8 C)  TempSrc: Oral  Oral Oral  SpO2: 100%  99% 100%  Weight:  62.1 kg    Height:       Weight change: 0.1 kg  Intake/Output Summary (Last 24 hours) at 08/11/2019 1401 Last data filed at 08/11/2019 2992 Gross per 24 hour  Intake 1383.53 ml  Output 1000 ml  Net 383.53 ml     Exam: Heart:: Regular rate and rhythm, S1S2 present or without murmur or extra heart sounds Lungs: normal and clear to auscultation and percussion Abdomen: soft, nontender, normal bowel sounds   Lab Results: @LABTEST2 @ Micro Results: Recent Results (from the past 240 hour(s))  SARS Coronavirus 2 by RT PCR (hospital order, performed in [place] hospital lab) Nasopharyngeal Nasopharyngeal Swab     Status: None   Collection Time: 08/03/19 10:53 PM   Specimen: Nasopharyngeal Swab  Result Value Ref Range Status   SARS Coronavirus 2 NEGATIVE NEGATIVE Final    Comment: (NOTE) SARS-CoV-2 target nucleic acids are NOT DETECTED.  The SARS-CoV-2 RNA is generally detectable in upper and lower respiratory specimens during the acute phase of infection. The lowest concentration of SARS-CoV-2 viral copies this assay can detect is 250 copies / mL. A  negative result does not preclude SARS-CoV-2 infection and should not be used as the sole basis for treatment or other patient management decisions.  A negative result may occur with improper specimen collection / handling, submission of specimen other than nasopharyngeal swab, presence of viral mutation(s) within the areas targeted by this assay, and inadequate number of viral copies (<250 copies / mL). A negative result must be combined with clinical observations, patient history, and epidemiological information.  Fact Sheet for Patients:   StrictlyIdeas.no  Fact Sheet for Healthcare Providers: BankingDealers.co.za  This test is not yet approved or  cleared by the [place] FDA and has been authorized for detection and/or diagnosis of SARS-CoV-2 by FDA under an Emergency Use Authorization (EUA).  This EUA will remain in effect (meaning this test can be used) for the duration of the COVID-19 declaration under Section 564(b)(1) of the Act, 21 U.S.C. section 360bbb-3(b)(1), unless the authorization is terminated or revoked sooner.  Performed at [facility], [address] [place], [place] [address]   SARS Coronavirus 2 by RT PCR (hospital order, performed in [facility] hospital lab) Nasopharyngeal Nasopharyngeal Swab     Status: None   Collection Time: 08/09/19  5:27 PM   Specimen: Nasopharyngeal Swab  Result Value Ref Range Status   SARS Coronavirus 2 NEGATIVE NEGATIVE Final    Comment: (NOTE) SARS-CoV-2 target nucleic acids are NOT DETECTED.  The SARS-CoV-2 RNA is generally detectable in upper and lower respiratory specimens during the acute phase of infection. The lowest concentration of SARS-CoV-2 viral copies this assay can detect is 250 copies / mL. A negative result does not preclude SARS-CoV-2 infection and should not be used as the sole basis for treatment or other patient management decisions.  A negative  result may occur with improper specimen collection / handling, submission of specimen other than nasopharyngeal swab, presence of viral mutation(s) within the areas targeted by this assay, and inadequate number of viral copies (<250 copies / mL). A negative result must be combined with clinical observations, patient history, and epidemiological information.  Fact Sheet for Patients:   StrictlyIdeas.no  Fact Sheet for Healthcare Providers: BankingDealers.co.za  This test is not yet approved or  cleared by the [place] FDA and has been authorized for detection and/or diagnosis of SARS-CoV-2 by FDA under an Emergency Use Authorization (EUA).  This EUA will remain in effect (meaning this test can be used) for the duration of the COVID-19 declaration under Section 564(b)(1) of the Act, 21 U.S.C. section 360bbb-3(b)(1), unless the authorization is terminated or revoked sooner.  Performed at [facility], [address] [place], [place] [address]   Blood Culture (routine x 2)     Status: None (Preliminary result)   Collection Time: 08/09/19  5:28 PM   Specimen: BLOOD  Result Value Ref Range Status   Specimen Description BLOOD BLOOD LEFT HAND  Final   Special Requests   Final    BOTTLES DRAWN AEROBIC AND ANAEROBIC Blood Culture results may not be optimal due to an inadequate volume of blood received in culture bottles   Culture   Final    NO GROWTH 2 DAYS Performed at [facility], [address] [place], [place] [address]    Report Status PENDING  Incomplete  Blood Culture (routine x 2)     Status: Abnormal   Collection Time: 08/09/19  5:28 PM   Specimen: BLOOD  Result Value Ref Range Status   Specimen Description   Final    BLOOD LEFT ANTECUBITAL Performed at [facility], [address] [place], [place] [address]    Special Requests   Final    BOTTLES DRAWN AEROBIC AND ANAEROBIC Blood Culture  results may not be optimal due to an inadequate volume of blood received in culture bottles Performed at [facility], [address] [place], [place] [address]    Culture  Setup Time   Final    Organism ID to follow [place] TO, READ BACK BY AND VERIFIED WITH: [place] @1729  08/10/19 MJU Performed at [facility], [address] [place], [place] [address]    Culture (A)  Final    STAPHYLOCOCCUS EPIDERMIDIS THE SIGNIFICANCE OF ISOLATING THIS ORGANISM FROM A SINGLE SET OF BLOOD CULTURES WHEN MULTIPLE SETS ARE DRAWN IS UNCERTAIN. PLEASE NOTIFY THE MICROBIOLOGY DEPARTMENT WITHIN ONE WEEK IF SPECIATION AND SENSITIVITIES ARE REQUIRED. Performed at [facility], [place] [address] [place], [place] [address]    Report Status 08/11/2019 FINAL  Final  Blood Culture ID Panel (Reflexed)     Status: Abnormal   Collection Time: 08/09/19  5:28 PM  Result Value Ref Range Status   Enterococcus faecalis NOT DETECTED NOT DETECTED Final   Enterococcus Faecium NOT DETECTED NOT DETECTED Final   Listeria monocytogenes NOT DETECTED NOT DETECTED Final   Staphylococcus species DETECTED (A)  NOT DETECTED Final    Comment: CRITICAL RESULT CALLED TO, READ BACK BY AND VERIFIED WITH: [place] @1729  08/10/19 MJU    Staphylococcus aureus (BCID) NOT DETECTED NOT DETECTED Final   Staphylococcus epidermidis DETECTED (A) NOT DETECTED Final    Comment: CRITICAL RESULT CALLED TO, READ BACK BY AND VERIFIED WITH: ALEX CHAPPELL @1729  08/10/19 MJU    Staphylococcus lugdunensis NOT DETECTED NOT DETECTED Final   Streptococcus species NOT DETECTED NOT DETECTED Final   Streptococcus agalactiae NOT DETECTED NOT DETECTED Final   Streptococcus pneumoniae NOT DETECTED NOT DETECTED Final   Streptococcus pyogenes NOT DETECTED NOT DETECTED Final   A.calcoaceticus-baumannii NOT DETECTED NOT DETECTED Final   Bacteroides fragilis NOT DETECTED NOT  DETECTED Final   Enterobacterales NOT DETECTED NOT DETECTED Final   Enterobacter cloacae complex NOT DETECTED NOT DETECTED Final   Escherichia coli NOT DETECTED NOT DETECTED Final   Klebsiella aerogenes NOT DETECTED NOT DETECTED Final   Klebsiella oxytoca NOT DETECTED NOT DETECTED Final   Klebsiella pneumoniae NOT DETECTED NOT DETECTED Final   Proteus species NOT DETECTED NOT DETECTED Final   Salmonella species NOT DETECTED NOT DETECTED Final   Serratia marcescens NOT DETECTED NOT DETECTED Final   Haemophilus influenzae NOT DETECTED NOT DETECTED Final   Neisseria meningitidis NOT DETECTED NOT DETECTED Final   Pseudomonas aeruginosa NOT DETECTED NOT DETECTED Final   Stenotrophomonas maltophilia NOT DETECTED NOT DETECTED Final   Candida albicans NOT DETECTED NOT DETECTED Final   Candida auris NOT DETECTED NOT DETECTED Final   Candida glabrata NOT DETECTED NOT DETECTED Final   Candida krusei NOT DETECTED NOT DETECTED Final   Candida parapsilosis NOT DETECTED NOT DETECTED Final   Candida tropicalis NOT DETECTED NOT DETECTED Final   Cryptococcus neoformans/gattii NOT DETECTED NOT DETECTED Final   Methicillin resistance mecA/C NOT DETECTED NOT DETECTED Final    Comment: Performed at [facility], [address] [place], [place] [address]  MRSA PCR Screening     Status: None   Collection Time: 08/09/19  8:57 PM   Specimen: Nasal Mucosa; Nasopharyngeal  Result Value Ref Range Status   MRSA by PCR NEGATIVE NEGATIVE Final    Comment:        The GeneXpert MRSA Assay (FDA approved for NASAL specimens only), is one component of a comprehensive MRSA colonization surveillance program. It is not intended to diagnose MRSA infection nor to guide or monitor treatment for MRSA infections. Performed at [facility] [address] [place], [place] [address]   Urine culture     Status: None   Collection Time: 08/10/19  5:00 AM   Specimen: In/Out Cath Urine  Result Value Ref  Range Status   Specimen Description   Final    IN/OUT CATH URINE Performed at [facility], [address] [place], [place] [address]    Special Requests   Final    NONE Performed at [facility], [address], [place], [place] [address]    Culture   Final    NO GROWTH Performed at [facility], [place] [address] [place], [place] [address]    Report Status 08/11/2019 FINAL  Final   Studies/Results: DG Chest Port 1 View  Result Date: 08/09/2019 CLINICAL DATA:  Sepsis. EXAM: PORTABLE CHEST 1 VIEW COMPARISON:  August 03, 2019 FINDINGS: There is a new small right-sided pleural effusion. There are bibasilar airspace opacities favored to represent areas of atelectasis. The heart size is unchanged. There are aortic calcifications. There is  no pneumothorax. IMPRESSION: 1. New small right-sided pleural effusion. 2. Bibasilar airspace opacities favored to represent areas of atelectasis. Electronically Signed   By: Constance Holster M.D.   On: 08/09/2019 17:32   CT Angio Abd/Pel W and/or Wo Contrast  Addendum Date: 08/09/2019   ADDENDUM REPORT: 08/09/2019 19:58 ADDENDUM: Additional impression point: Gallbladder distention is a nonspecific finding in the absence of inflammatory change. Chronic cholecystitis/gallbladder dysfunction as well as occasionally acute cholecystitis can present with this appearance. Recommend correlation with right upper quadrant symptoms and labs. Could consider further evaluation with right upper quadrant ultrasound. Addendum by telephone at the time of submission on 08/09/2019 at 7:57 pm to provider [facility] , who verbally acknowledged these results. Electronically Signed   By: Lovena Le M.D.   On: 08/09/2019 19:58   Result Date: 08/09/2019 CLINICAL DATA:  GI bleed, recent discharge for same EXAM: CTA ABDOMEN AND PELVIS WITHOUT AND WITH CONTRAST TECHNIQUE: Multidetector CT imaging of the abdomen and pelvis was performed using the standard protocol  during bolus administration of intravenous contrast. Multiplanar reconstructed images and MIPs were obtained and reviewed to evaluate the vascular anatomy. CONTRAST:  84mL OMNIPAQUE IOHEXOL 350 MG/ML SOLN COMPARISON:  Renal ultrasound 08/04/2019, CT angio chest 11/22/2018, abdominal ultrasound 08/19/2011 FINDINGS: VASCULAR Aorta: Calcified noncalcified atheromatous plaque present throughout the native abdominal aorta. No acute luminal abnormality, periaortic stranding or hemorrhage is seen. No aneurysm or ectasia. Celiac: Ostial plaque at the celiac axis origin results in some mild ostial narrowing which is exacerbated by slightly hook like configuration implicating some compression by the median arcuate ligament. There is post stenotic dilatation up to 10 mm in size. Minimal plaque is present in the splenic artery branch as well. No other significant stenosis is seen however. No acute luminal abnormality such as dissection, aneurysm or features of vasculitis. SMA: Mild ostial plaque narrowing at the SMA origin with calcified and noncalcified plaque in the proximal SMA resulting in at most mild multifocal narrowings of the proximal vessel. Vessel otherwise normally opacified without acute luminal abnormality or features of vasculitis. Renals: Single renal arteries bilaterally. Ostial plaque results in mild narrowing on the right and moderate narrowing on the left. No evidence of aneurysm, dissection, vasculitis or features of fibromuscular dysplasia. IMA: Ostial plaque resulting in severe narrowing of the IMA artery origin. Vessel is otherwise opacified normally without significant stenosis. No acute luminal abnormality. No features of vasculitis. Inflow: Extensive calcific plaque throughout the common, internal external iliac arteries which does result in some mild segmental narrowing of the proximal internal iliac arteries bilaterally. Proximal branches are otherwise normally opacified. Proximal Outflow: Plaque  seen throughout the proximal outflow vessels including the bilateral common femoral and visualized superficial femoral and deep femoral arteries. Results in at least some mild narrowing at the right deep femoral origin. No other significant stenosis or acute luminal abnormality is seen. No aneurysm or ectasia. Veins: No acute abnormality of the major venous branches seen on venous or arterial phase imaging. Portal and hepatic veins are both filled well opacified and free of acute abnormality. Review of the MIP images confirms the above findings. NON-VASCULAR Lower chest: Small bilateral pleural effusions with some adjacent passive atelectasis which appears to enhance uniformly. Mild basilar bronchitic changes are present. Mild cardiomegaly with biatrial enlargement and three-vessel coronary artery disease as well as extensive calcification of the aortic leaflets and mitral valve annulus. Hepatobiliary: Punctate likely benign calcification in the caudate, could reflect sequela of prior granulomatous disease. No concerning liver lesions.  Normal liver attenuation. Smooth surface contour. Marked distension of the gallbladder without visible calcified gallstone within the gallbladder lumen or biliary tree. No pericholecystic fluid or inflammation. No biliary ductal dilatation. Pancreas: Moderate pancreatic atrophy. No pancreatic inflammation, discernible lesion or ductal dilatation. Spleen: Normal arterial and venous enhancement patterns of the spleen. Splenic size is upper limits normal. No focal splenic lesion. Adrenals/Urinary Tract: Mild lobular thickening of the adrenal glands may reflect some senescent adrenal hyperplasia. Kidneys enhance symmetrically. Calcification in the interpolar right kidney, previously suspected to reflect a renal calculus more accurately reflects a peripherally calcified, partially exophytic cyst (Bosniak IIF). No other focal concerning renal lesions. Mild circumferential thickening of the  bladder wall with perivesicular hazy stranding and indentation of bladder base by an enlarged prostate. Stomach/Bowel: Small amount of posterior layering hyperdensity within a left upper quadrant small bowel loop likely reflect ingested material given the presence on precontrast examination. No abnormal sites of contrast blush or accumulation are seen to suggest a discernible site of active bleeding. Small sliding-type hiatal hernia. Distal stomach is unremarkable. Small duodenal lipoma (7/88) as well as two air-filled duodenal diverticula measuring up to 3.7 cm (7/80, 7/67) no small bowel thickening or dilatation. Appendix is not visualized. No focal inflammation the vicinity of the cecum to suggest an occult appendicitis. No colonic dilatation or wall thickening. Portion of the sigmoid colon protrudes into an indirect left inguinal hernia without convincing evidence of vascular compromise of the bowel wall or resulting mechanical obstruction. Into a scattered colonic diverticula without focal inflammation to suggest diverticulitis. 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 No pathologically enlarged lymph nodes. Reproductive: Mild prostatomegaly. No focal concerning abnormality of the prostate or seminal vesicles. Other: Diffuse circumferential body wall edema most pronounced posteriorly. Fat and bowel containing left inguinal hernia. Small fat containing right inguinal hernia as well. No abdominopelvic free air or fluid. Musculoskeletal: Multilevel degenerative changes are present in the imaged portions of the spine. 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 Additional degenerative changes of the hips and pelvis including partial ankylosis of  the SI joints. IMPRESSION: 1. No abnormal sites of contrast blush or accumulation are seen to suggest a discernible site of active bleeding. If there is persisting concern for slow bleed or intermittent bleeding, nuclear medicine bleeding scan could be performed. 2. Duodenal and colonic diverticulosis without evidence of active diverticular inflammation, can present with GI bleeding in the absence of inflammation. 3. Aortic Atherosclerosis (ICD10-I70.0). Multilevel splanchnic and renal artery stenoses secondary to atheromatous disease, as detailed level by level above. 4. Hook-like configuration of the celiac axis could reflect some compression by the median arcuate ligament. Mild poststenotic dilatation. 5. Portion of the sigmoid colon protrudes into an indirect left inguinal hernia without convincing evidence of vascular compromise of the bowel wall or resulting mechanical obstruction. 6. 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 7. Mild circumferential thickening of the bladder wall with perivesicular hazy stranding and indentation of bladder base by an enlarged prostate. Findings are suggestive of cystitis and/or chronic outlet obstruction. Correlate with urinalysis. 8. Cardiomegaly with biatrial enlargement and three-vessel coronary artery disease. 9. Features suggestive of anasarca with body wall edema and bilateral pleural effusions. 10. Thick peripheral calcifications of the partially exophytic interpolar right renal cyst measuring up to 10 mm. Recommend renal protocol  MRI at 6 and 12 months then yearly for 5 years. This recommendation follows ACR consensus guidelines: Management of the Incidental Renal Mass on CT: A White Paper of the ACR Incidental Findings Committee. J Am Coll Radiol [id number]. Electronically Signed: By: Lovena Le M.D. On: 08/09/2019 19:17   US Abdomen Limited RUQ  Result Date: 08/09/2019 CLINICAL DATA:  Gallbladder disease, abnormal  CT EXAM: ULTRASOUND ABDOMEN LIMITED RIGHT UPPER QUADRANT COMPARISON:  CT 08/09/2019 FINDINGS: Gallbladder: Gallbladder appears diffusely distended with heterogeneous mixture of layering biliary sludge. No visible shadowing gallstones are present. No significant gallbladder wall thickening. Sonographic Percell Miller sign is reportedly negative. Common bile duct: Diameter: 4.8 mm, nondilated Liver: Suboptimal visualization of the left lobe liver due to patient body habitus. Diffusely coarsened hepatic echotexture without visible focal lesion. Portal vein is patent on color Doppler imaging with normal direction of blood flow towards the liver. Other: None. IMPRESSION: Mildly coarsened liver echotexture is nonspecific. Could correlate with LFTs. Gallbladder distention with extensive biliary sludge. No visible calcified gallstones. No additional supporting features of acute cholecystitis however distention is a nonspecific finding that can reflect gallbladder dysfunction/chronic cholecystitis. Could consider outpatient evaluation with HIDA scan with gallbladder ejection fraction. Electronically Signed   By: Lovena Le M.D.   On: 08/09/2019 21:11   Medications: I have reviewed the patient's current medications. Scheduled Meds: . [START ON 08/13/2019] pantoprazole  40 mg Intravenous Q12H   Continuous Infusions: . sodium chloride 100 mL/hr at 08/11/19 1117  . ceFEPime (MAXIPIME) IV 2 g (08/11/19 1353)  . metronidazole 500 mg (08/11/19 1119)  . pantoprozole (PROTONIX) infusion 8 mg/hr (08/11/19 1352)   PRN Meds:.docusate sodium, ondansetron (ZOFRAN) IV, polyethylene glycol   Assessment: Active Problems:   GI bleed   GIB (gastrointestinal bleeding)    Plan: This patient was consulted for possible GI bleed.  The patient had a GI bleed prior to his most recent discharge and was brought back with hypotension unlikely related to any further GI bleeding since his hemoglobin has been stable.  The patient had luminal  evaluation with an EGD and colonoscopy.  There is no indication for repeating these tests at this time.  I will sign off.  Please call if any further GI concerns or questions.  We would like to thank you for the opportunity to participate in the care of CODIE KROGH.     LOS: 2 days   Albert [name] 08/11/2019, 2:01 PM Pager [phone] 7am-5pm  Check AMION for 5pm -7am coverage and on weekends

## 2019-08-11 NOTE — Care Management Important Message (Signed)
Important Message  Patient Details  Name: Albert [name] MRN: [id number] Date of Birth: July 26, 1933   Medicare Important Message Given:  N/A - LOS <3 / Initial given by admissions     [name] 08/11/2019, 8:31 AM

## 2019-08-11 NOTE — Progress Notes (Signed)
Initial Nutrition Assessment  DOCUMENTATION CODES:   Severe malnutrition in context of chronic illness  INTERVENTION:  Provide Ensure Enlive po TID, each supplement provides 350 kcal and 20 grams of protein.  Encouraged adequate intake of calories and protein at meals.  NUTRITION DIAGNOSIS:   Severe Malnutrition related to chronic illness (CKD, hx COVID-19 11/2018, inadequate oral intake) as evidenced by severe fat depletion, severe muscle depletion.  GOAL:   Patient will meet greater than or equal to 90% of their needs  MONITOR:   PO intake, Supplement acceptance, Labs, Weight trends, I & O's  REASON FOR ASSESSMENT:   Malnutrition Screening Tool    ASSESSMENT:   86 year old male with PMHx of HTN, CKD stage III, chronic pain, hx COVID-19 in 11/2018, recent admission 7/29-8/3 for upper GI bleed (esophageal and duodenal ulcer) now admitted with weakness, melena, hypotension.   Met with patient at bedside. Patient reports his wife passed away in 03-16-12 and since then he has not eaten as well. He also has had a decreased appetite. He reports he tries to eat 3 meals per day but eats smaller amounts at meals and does not eat the "right foods." He reports he will eat fast food/convenience foods and often won't eat protein at meals. Patient was eating his lunch at time of RD assessment. Discussed importance of adequate intake of calories and protein. Patient is amenable to drinking Ensure to help meet calorie/protein needs.  Patient reports his UBW was 200 lbs many years ago and he has slowly lost weight over time. Current weight is 62.1 kg (136.91 lbs).   Medications reviewed and include: Protonix, NS at 100 mL/hr, cefepime, Flagyl, Protonix.   Labs reviewed: Chloride 113, CO2 20, BUN 35, Creatinine 1.39.  NUTRITION - FOCUSED PHYSICAL EXAM:    Most Recent Value  Orbital Region Severe depletion  Upper Arm Region Severe depletion  Thoracic and Lumbar Region Moderate depletion   Buccal Region Severe depletion  Temple Region Severe depletion  Clavicle Bone Region Severe depletion  Clavicle and Acromion Bone Region Severe depletion  Scapular Bone Region Moderate depletion  Dorsal Hand Severe depletion  Patellar Region Moderate depletion  Anterior Thigh Region Moderate depletion  Posterior Calf Region Severe depletion  Edema (RD Assessment) None  Hair Reviewed  Eyes Reviewed  Mouth Reviewed  Skin Reviewed  Nails Reviewed     Diet Order:   Diet Order            Diet regular Room service appropriate? Yes; Fluid consistency: Thin  Diet effective now                EDUCATION NEEDS:   No education needs have been identified at this time  Skin:  Skin Assessment: Skin Integrity Issues: (skin tear to buttocks)  Last BM:  08/10/2019  Height:   Ht Readings from Last 1 Encounters:  08/09/19 '5\' 4"'  (1.626 m)   Weight:   Wt Readings from Last 1 Encounters:  08/11/19 62.1 kg   BMI:  Body mass index is 23.5 kg/m.  Estimated Nutritional Needs:   Kcal:  1700-1900  Protein:  90-100 grams  Fluid:  1.6 L/day  Jacklynn Barnacle, MS, RD, LDN Pager number available on Amion

## 2019-08-11 NOTE — Evaluation (Signed)
Occupational Therapy Evaluation Patient Details Name: Albert [name] MRN: [id number] DOB: [birth year] Today's Date: 08/11/2019    History of Present Illness Pt is an 86 y.o. male presenting to hospital 7/29 with weakness, nausea, and bloody diarrhea.  Pt admitted with symptomatic anemia, AKI superimposed on CKD IIIa, metabolic acidosis, coagulopathy, htn, and SIRS.  Pt s/p EGD and colonsocopy; s/p blood transfusions.  PMH includes COVID-19 (11/2018), htn, CKD, basal cell carcinoma, htn, CKD.   Clinical Impression   Pt was seen for OT evaluation this date. Prior to recent hospital admission, pt reports being modified independent with mobility using AD and indep with ADL and only requiring assist from cousin for groceries/transportation a couple times per week. Pt reports since recent admission he went home (therapy had recommended SNF) and required significantly more assist with bathing, dressing, and mobility and ultimately returned to the hospital. Pt lives alone but has family PRN. Uses SPC vs 2WW vs rollator for mobility depending on his "RA pain." Pt denies falls in past 5 years. Pt required significant assist for bed mobility this afternoon and for LB ADL tasks. Pt not at baseline PLOF 2/2 impairments as described below (See OT problem list) which functionally limit his ability to perform ADL/self-care tasks. Pt would benefit from skilled OT to address noted impairments and functional limitations (see below for any additional details) in order to maximize safety and independence while minimizing falls risk and caregiver burden. Upon hospital discharge, recommend STR to maximize pt safety and return to PLOF.     Follow Up Recommendations  SNF    Equipment Recommendations  3 in 1 bedside commode    Recommendations for Other Services       Precautions / Restrictions Precautions Precautions: Fall Restrictions Weight Bearing Restrictions: No      Mobility Bed Mobility Overal bed mobility:  Needs Assistance             General bed mobility comments: Max A for bed mobility to improve positioning for self feeding of lunch with max verbal cues for hand placement  Transfers                      Balance                                           ADL either performed or assessed with clinical judgement   ADL Overall ADL's : Needs assistance/impaired                                       General ADL Comments: Max A LB ADL, indep with self feeding     Vision Baseline Vision/History: Wears glasses Wears Glasses: At all times ("trifocals") Patient Visual Report: No change from baseline       Perception     Praxis      Pertinent Vitals/Pain Pain Assessment: No/denies pain     Hand Dominance Right   Extremity/Trunk Assessment Upper Extremity Assessment Upper Extremity Assessment: Generalized weakness   Lower Extremity Assessment Lower Extremity Assessment: Generalized weakness (impaired R knee flexion ROM)       Communication Communication Communication: HOH   Cognition Arousal/Alertness: Awake/alert Behavior During Therapy: WFL for tasks assessed/performed Overall Cognitive Status: Within Functional Limits for tasks assessed  General Comments       Exercises     Shoulder Instructions      Home Living Family/patient expects to be discharged to:: Private residence Living Arrangements: Alone Available Help at Discharge: Family;Available PRN/intermittently Type of Home: House Home Access: Stairs to enter [employer] of Steps: 1 (3 inch) step up Entrance Stairs-Rails: Right;Left Home Layout: Able to live on main level with bedroom/bathroom (2nd floor bonus room upstairs (does not need to access))     Bathroom Shower/Tub: Occupational psychologist: Handicapped height     Home Equipment: Environmental consultant - 2 wheels;Cane - single point;Shower  seat - built in;Grab bars - tub/shower;Grab bars - toilet   Additional Comments: Lift recliner      Prior Functioning/Environment Level of Independence: Needs assistance  Gait / Transfers Assistance Needed: Ambulates with SPC vs rollator vs 2WW depending on pt's knee pain ADL's / Homemaking Assistance Needed: 3x's per week pt's cousin comes to assist as needed (with groceries, MD appts, etc); pt reports needing significantly more assist for bathing, dressing, and all IADL Tasks since recent admission   Comments: No falls in past 5 years        OT Problem List: Decreased strength;Decreased range of motion;Pain;Decreased activity tolerance;Impaired balance (sitting and/or standing);Decreased knowledge of use of DME or AE      OT Treatment/Interventions: Self-care/ADL training;Therapeutic exercise;Therapeutic activities;DME and/or AE instruction;Patient/family education;Balance training    OT Goals(Current goals can be found in the care plan section) Acute Rehab OT Goals Patient Stated Goal: go to rehab and get stronger before going home safer OT Goal Formulation: With patient Time For Goal Achievement: 08/25/19 Potential to Achieve Goals: Good ADL Goals Pt Will Perform Lower Body Dressing: with mod assist;sit to/from stand Pt Will Transfer to Toilet: with min assist;bedside commode;ambulating (LRAD for ambn) Additional ADL Goal #1: Pt will perform BUE exercise with modified independence per handout provided to increase strength for mobility and ADL tasks.  OT Frequency: Min 1X/week   Barriers to D/C: Decreased caregiver support          Co-evaluation              AM-PAC OT "6 Clicks" Daily Activity     Outcome Measure Help from another person eating meals?: None Help from another person taking care of personal grooming?: A Little Help from another person toileting, which includes using toliet, bedpan, or urinal?: A Lot Help from another person bathing (including washing,  rinsing, drying)?: A Lot Help from another person to put on and taking off regular upper body clothing?: A Little Help from another person to put on and taking off regular lower body clothing?: A Lot 6 Click Score: 16   End of Session    Activity Tolerance: Patient tolerated treatment well Patient left: in bed;with call bell/phone within reach;with bed alarm set  OT Visit Diagnosis: Other abnormalities of gait and mobility (R26.89);Muscle weakness (generalized) (M62.81);Pain Pain - Right/Left: Right Pain - part of body: Knee                Time: 1191-4782 OT Time Calculation (min): 11 min Charges:  OT General Charges $OT Visit: 1 Visit OT Evaluation $OT Eval Moderate Complexity: 1 Mod  Jeni Salles, MPH, MS, OTR/L ascom [phone] 08/11/19, 1:51 PM

## 2019-08-12 DIAGNOSIS — E43 Unspecified severe protein-calorie malnutrition: Secondary | ICD-10-CM | POA: Insufficient documentation

## 2019-08-12 LAB — CBC
HCT: 27.4 % — ABNORMAL LOW (ref 39.0–52.0)
Hemoglobin: 8.9 g/dL — ABNORMAL LOW (ref 13.0–17.0)
MCH: 29.4 pg (ref 26.0–34.0)
MCHC: 32.5 g/dL (ref 30.0–36.0)
MCV: 90.4 fL (ref 80.0–100.0)
Platelets: 165 10*3/uL (ref 150–400)
RBC: 3.03 MIL/uL — ABNORMAL LOW (ref 4.22–5.81)
RDW: 16.2 % — ABNORMAL HIGH (ref 11.5–15.5)
WBC: 9.3 10*3/uL (ref 4.0–10.5)
nRBC: 0 % (ref 0.0–0.2)

## 2019-08-12 LAB — COMPREHENSIVE METABOLIC PANEL
ALT: 15 U/L (ref 0–44)
AST: 26 U/L (ref 15–41)
Albumin: 2 g/dL — ABNORMAL LOW (ref 3.5–5.0)
Alkaline Phosphatase: 101 U/L (ref 38–126)
Anion gap: 8 (ref 5–15)
BUN: 30 mg/dL — ABNORMAL HIGH (ref 8–23)
CO2: 21 mmol/L — ABNORMAL LOW (ref 22–32)
Calcium: 7 mg/dL — ABNORMAL LOW (ref 8.9–10.3)
Chloride: 113 mmol/L — ABNORMAL HIGH (ref 98–111)
Creatinine, Ser: 1.3 mg/dL — ABNORMAL HIGH (ref 0.61–1.24)
GFR calc Af Amer: 58 mL/min — ABNORMAL LOW (ref >60)
GFR calc non Af Amer: 50 mL/min — ABNORMAL LOW (ref >60)
Glucose, Bld: 95 mg/dL (ref 70–99)
Potassium: 3.6 mmol/L (ref 3.5–5.1)
Sodium: 142 mmol/L (ref 135–145)
Total Bilirubin: 1.1 mg/dL (ref 0.3–1.2)
Total Protein: 4.7 g/dL — ABNORMAL LOW (ref 6.5–8.1)

## 2019-08-12 MED ORDER — ENOXAPARIN SODIUM 40 MG/0.4ML ~~LOC~~ SOLN
40.0000 mg | SUBCUTANEOUS | Status: DC
  Administered 2019-08-12 – 2019-08-13 (×2): 40 mg via SUBCUTANEOUS
  Filled 2019-08-12 (×2): qty 0.4

## 2019-08-12 NOTE — Progress Notes (Signed)
PROGRESS NOTE  Albert [name] WJX:[id number] DOB: [birth year] DOA: 08/09/2019 PCP: Albert Pitch, MD  HPI/Recap of past 24 hours: HPI from Dr Albert [name] is a 86 year old male with a past medical history significant for HTN, CKD stage III, chronic pain, COVID-19 in 11/2018, and recent admission 08/03/19 to 08/08/2019 for UGI bleed (found to have esophageal and duodenal ulcer, no active bleeding on EGD performed 08/05/2019) presents to [facility] ED on 08/09/2019 due to weakness and melena.  Per patient report, his home health aide was concerned about dark black stools in his diapers. In the ED he was noted to be tachycardic with otherwise stable vital signs.  Initial work-up in the ED revealed WBC 28, lactic acid 4, hemoglobin 9.7, high-sensitivity troponin 43, bicarb 20, creatinine 1.43, BUN 43. CXR with right pleural effusion and bilateral airspace opacities concerning for atelectasis. CTA abdomen and pelvis with no discernible site of active bleeding, diverticulosis, questionable mesenteritis, questionable cystitis, and distended gallbladder.  RUQ Abdominal US showed extensive biliary sludge, but no evidence of gallstones or cholecystitis. COVID-19 PCR is negative.  He was given broad spectrum antibiotics, placed on Octreotide and Protonix drip, and received 1 unit pRBC's.  While in the ED, pt was noted to be hypotensive. PCCM consulted and pt was transferred to the ICU. Pt didn't require any pressors. TRH assumed care on 08/11/19.     Today, patient denied any new complaints, BP still soft, noted to be have loose stools.  Denies any abdominal pain, nausea/vomiting, shortness of breath, chest pain, fever/chills.    Assessment/Plan: Active Problems:   GI bleed   GIB (gastrointestinal bleeding)   Protein-calorie malnutrition, severe   ??Possible GI bleed Recent admission for GI bleed and anemia on 08/03/2019 Currently melena likely 2/2 old blood from recent GI bleed EGD done on 08/05/2019 showed  esophageal ulcer and 2 duodenal ulcers with no stigmata of bleeding CTA abdomen/pelvis done on 08/09/2019 showed no active bleeding, duodenal and colonic diverticulosis without evidence of active diverticular inflammation or bleed GI consulted, no further plans to do any intervention as hemoglobin is remained stable and melena is likely due to passage of old blood from recent bleed Continue PPI Monitor CBC closely  Acute on chronic blood loss anemia Baseline hemoglobin around 9 S/p 1 units of pRBC on 08/09/2019 Monitor closely Daily CBC  ?? Possible severe sepsis criteria on admission ?? Intravascular volume depletion Tachycardia, hypotensive, lactic acidosis, leukocytosis on admission No identifiable source of possible sepsis Currently afebrile, with resolving leukocytosis LA currently WNL Procalcitonin 0.19->0.36->0.33 Blood culture 1/4 bottle growing staph epidermidis, likely contaminant UA with large leukocytes, greater than 50 WBC, UC with no growth although taken after antibiotic was started Chest x-ray with bilateral opacities, more consistent with atelectasis CTA abdomen/pelvis with ?  Mesenteritis, cystitis Right upper quadrant abdominal ultrasound, no evidence of gallstones or cholecystitis Continue cefepime, Flagyl, will DC vancomycin for now (MRSA by PCR negative) Monitor closely  Hypotension History of hypertension Likely part of sepsis phenomenon versus intravascular volume depletion Continue IV fluids, never required pressors Monitor closely  Diarrhea Noted loose stools, no fever, resolving leukocytosis GI panel pending  CKD stage IIIa/metabolic acidosis Daily BMP        Malnutrition Type:  Nutrition Problem: Severe Malnutrition Etiology: chronic illness (CKD, hx COVID-19 11/2018, inadequate oral intake)   Malnutrition Characteristics:  Signs/Symptoms: severe fat depletion, severe muscle depletion   Nutrition Interventions:  Interventions: Refer to  RD note for recommendations    Estimated body mass  index is 23.5 kg/m as calculated from the following:   Height as of this encounter: 5\' 4"  (1.626 m).   Weight as of this encounter: 62.1 kg.     Code Status: Full  Family Communication: None at bedside  Disposition Plan: Status is: Inpatient  Remains inpatient appropriate because:Inpatient level of care appropriate due to severity of illness   Dispo: The patient is from: Home              Anticipated d/c is to: SNF              Anticipated d/c date is: 2 days              Patient currently is not medically stable to d/c.    Consultants:  GI  PCCM  Procedures:  None  Antimicrobials:  Cefepime  Flagyl  DVT prophylaxis: Lovenox    Objective: Vitals:   08/12/19 0347 08/12/19 0741 08/12/19 1212 08/12/19 1500  BP: 110/61 (!) 90/46 (!) 101/58 108/60  Pulse: 81 74 80 77  Resp: 13 17 17 17   Temp: 97.9 F (36.6 C) 98.2 F (36.8 C) 98.1 F (36.7 C) 98.1 F (36.7 C)  TempSrc: Oral   Oral  SpO2: 100% 99% 100% 100%  Weight:      Height:        Intake/Output Summary (Last 24 hours) at 08/12/2019 1626 Last data filed at 08/12/2019 0900 Gross per 24 hour  Intake 2084.15 ml  Output 1425 ml  Net 659.15 ml   Filed Weights   08/09/19 1616 08/11/19 0500  Weight: 62 kg 62.1 kg    Exam:  General: NAD, chronically ill-appearing, deconditioned  Cardiovascular: S1, S2 present  Respiratory: CTAB  Abdomen: Soft, nontender, nondistended, bowel sounds present  Musculoskeletal: No bilateral pedal edema noted  Skin: Normal  Psychiatry: Normal mood   Data Reviewed: CBC: Recent Labs  Lab 08/09/19 1727 08/09/19 1727 08/09/19 2057 08/10/19 0018 08/10/19 0500 08/10/19 0500 08/10/19 1130 08/10/19 1744 08/10/19 2348 08/11/19 0422 08/12/19 0901  WBC 25.5*  --  19.5*  --  17.1*  --   --   --   --  11.3* 9.3  NEUTROABS 21.8*  --  17.0*  --   --   --   --   --   --   --   --   HGB 8.9*   < > 7.5*   < > 8.2*    < > 8.1* 8.1* 8.3* 8.3* 8.9*  HCT 28.0*   < > 23.4*   < > 24.4*   < > 25.4* 25.1* 25.0* 24.7* 27.4*  MCV 93.3  --  93.2  --  88.7  --   --   --   --  89.5 90.4  PLT 208  --  145*  --  151  --   --   --   --  159 165   < > = values in this interval not displayed.   Basic Metabolic Panel: Recent Labs  Lab 08/09/19 1624 08/09/19 1727 08/10/19 0500 08/11/19 0422 08/12/19 0901  NA 138 140 139 140 142  K 4.3 4.8 4.1 3.8 3.6  CL 106 107 110 113* 113*  CO2 20* 21* 20* 20* 21*  GLUCOSE 100* 96 108* 120* 95  BUN 43* 44* 41* 35* 30*  CREATININE 1.43* 1.54* 1.28* 1.39* 1.30*  CALCIUM 7.9* 7.8* 7.3* 7.0* 7.0*  MG  --  1.5*  --   --   --  GFR: Estimated Creatinine Clearance: 35.4 mL/min (A) (by C-G formula based on SCr of 1.3 mg/dL (H)). Liver Function Tests: Recent Labs  Lab 08/09/19 1624 08/09/19 1727 08/10/19 0500 08/11/19 0422 08/12/19 0901  AST 47* 47* 30 27 26   ALT 20 18 16 16 15   ALKPHOS 90 85 68 95 101  BILITOT 1.3* 1.3* 1.4* 1.1 1.1  PROT 5.6* 5.4* 4.3* 4.3* 4.7*  ALBUMIN 2.6* 2.6* 1.9* 1.9* 2.0*   No results for input(s): LIPASE, AMYLASE in the last 168 hours. No results for input(s): AMMONIA in the last 168 hours. Coagulation Profile: Recent Labs  Lab 08/09/19 1727  INR 1.0   Cardiac Enzymes: No results for input(s): CKTOTAL, CKMB, CKMBINDEX, TROPONINI in the last 168 hours. BNP (last 3 results) No results for input(s): PROBNP in the last 8760 hours. HbA1C: No results for input(s): HGBA1C in the last 72 hours. CBG: No results for input(s): GLUCAP in the last 168 hours. Lipid Profile: No results for input(s): CHOL, HDL, LDLCALC, TRIG, CHOLHDL, LDLDIRECT in the last 72 hours. Thyroid Function Tests: No results for input(s): TSH, T4TOTAL, FREET4, T3FREE, THYROIDAB in the last 72 hours. Anemia Panel: No results for input(s): VITAMINB12, FOLATE, FERRITIN, TIBC, IRON, RETICCTPCT in the last 72 hours. Urine analysis:    Component Value Date/Time    COLORURINE YELLOW (A) 08/10/2019 0500   APPEARANCEUR HAZY (A) 08/10/2019 0500   LABSPEC 1.021 08/10/2019 0500   PHURINE 5.0 08/10/2019 0500   GLUCOSEU NEGATIVE 08/10/2019 0500   HGBUR SMALL (A) 08/10/2019 0500   BILIRUBINUR NEGATIVE 08/10/2019 0500   KETONESUR NEGATIVE 08/10/2019 0500   PROTEINUR NEGATIVE 08/10/2019 0500   NITRITE NEGATIVE 08/10/2019 0500   LEUKOCYTESUR LARGE (A) 08/10/2019 0500   Sepsis Labs: @LABRCNTIP (procalcitonin:4,lacticidven:4)  ) Recent Results (from the past 240 hour(s))  SARS Coronavirus 2 by RT PCR (hospital order, performed in [place] hospital lab) Nasopharyngeal Nasopharyngeal Swab     Status: None   Collection Time: 08/03/19 10:53 PM   Specimen: Nasopharyngeal Swab  Result Value Ref Range Status   SARS Coronavirus 2 NEGATIVE NEGATIVE Final    Comment: (NOTE) SARS-CoV-2 target nucleic acids are NOT DETECTED.  The SARS-CoV-2 RNA is generally detectable in upper and lower respiratory specimens during the acute phase of infection. The lowest concentration of SARS-CoV-2 viral copies this assay can detect is 250 copies / mL. A negative result does not preclude SARS-CoV-2 infection and should not be used as the sole basis for treatment or other patient management decisions.  A negative result may occur with improper specimen collection / handling, submission of specimen other than nasopharyngeal swab, presence of viral mutation(s) within the areas targeted by this assay, and inadequate number of viral copies (<250 copies / mL). A negative result must be combined with clinical observations, patient history, and epidemiological information.  Fact Sheet for Patients:   StrictlyIdeas.no  Fact Sheet for Healthcare Providers: BankingDealers.co.za  This test is not yet approved or  cleared by the [place] FDA and has been authorized for detection and/or diagnosis of SARS-CoV-2 by FDA under an  Emergency Use Authorization (EUA).  This EUA will remain in effect (meaning this test can be used) for the duration of the COVID-19 declaration under Section 564(b)(1) of the Act, 21 U.S.C. section 360bbb-3(b)(1), unless the authorization is terminated or revoked sooner.  Performed at [facility], [address] [place], [place] [address]   SARS Coronavirus 2 by RT PCR (hospital order, performed in [facility] hospital lab) Nasopharyngeal Nasopharyngeal  Swab     Status: None   Collection Time: 08/09/19  5:27 PM   Specimen: Nasopharyngeal Swab  Result Value Ref Range Status   SARS Coronavirus 2 NEGATIVE NEGATIVE Final    Comment: (NOTE) SARS-CoV-2 target nucleic acids are NOT DETECTED.  The SARS-CoV-2 RNA is generally detectable in upper and lower respiratory specimens during the acute phase of infection. The lowest concentration of SARS-CoV-2 viral copies this assay can detect is 250 copies / mL. A negative result does not preclude SARS-CoV-2 infection and should not be used as the sole basis for treatment or other patient management decisions.  A negative result may occur with improper specimen collection / handling, submission of specimen other than nasopharyngeal swab, presence of viral mutation(s) within the areas targeted by this assay, and inadequate number of viral copies (<250 copies / mL). A negative result must be combined with clinical observations, patient history, and epidemiological information.  Fact Sheet for Patients:   StrictlyIdeas.no  Fact Sheet for Healthcare Providers: BankingDealers.co.za  This test is not yet approved or  cleared by the [place] FDA and has been authorized for detection and/or diagnosis of SARS-CoV-2 by FDA under an Emergency Use Authorization (EUA).  This EUA will remain in effect (meaning this test can be used) for the duration of the COVID-19 declaration under Section  564(b)(1) of the Act, 21 U.S.C. section 360bbb-3(b)(1), unless the authorization is terminated or revoked sooner.  Performed at [facility], [address], [place], [place] [address]   Blood Culture (routine x 2)     Status: None (Preliminary result)   Collection Time: 08/09/19  5:28 PM   Specimen: BLOOD  Result Value Ref Range Status   Specimen Description BLOOD BLOOD LEFT HAND  Final   Special Requests   Final    BOTTLES DRAWN AEROBIC AND ANAEROBIC Blood Culture results may not be optimal due to an inadequate volume of blood received in culture bottles   Culture   Final    NO GROWTH 3 DAYS Performed at [facility], [address], [place], [place] [address]    Report Status PENDING  Incomplete  Blood Culture (routine x 2)     Status: Abnormal   Collection Time: 08/09/19  5:28 PM   Specimen: BLOOD  Result Value Ref Range Status   Specimen Description   Final    BLOOD LEFT ANTECUBITAL Performed at [facility], [address], [place], [place] [address]    Special Requests   Final    BOTTLES DRAWN AEROBIC AND ANAEROBIC Blood Culture results may not be optimal due to an inadequate volume of blood received in culture bottles Performed at [facility], [address] [place], [place] [address]    Culture  Setup Time   Final    Organism ID to follow [place] TO, READ BACK BY AND VERIFIED WITH: [place] @1729  08/10/19 MJU Performed at [facility], [address] [place], [place] [address]    Culture (A)  Final    STAPHYLOCOCCUS EPIDERMIDIS THE SIGNIFICANCE OF ISOLATING THIS ORGANISM FROM A SINGLE SET OF BLOOD CULTURES WHEN MULTIPLE SETS ARE DRAWN IS UNCERTAIN. PLEASE NOTIFY THE MICROBIOLOGY DEPARTMENT WITHIN ONE WEEK IF SPECIATION AND SENSITIVITIES ARE REQUIRED. Performed at [facility], [place] [address], [place],  [address]    Report Status 08/11/2019  FINAL  Final  Blood Culture ID Panel (Reflexed)     Status: Abnormal  Collection Time: 08/09/19  5:28 PM  Result Value Ref Range Status   Enterococcus faecalis NOT DETECTED NOT DETECTED Final   Enterococcus Faecium NOT DETECTED NOT DETECTED Final   Listeria monocytogenes NOT DETECTED NOT DETECTED Final   Staphylococcus species DETECTED (A) NOT DETECTED Final    Comment: CRITICAL RESULT CALLED TO, READ BACK BY AND VERIFIED WITH: ALEX CHAPPELL @1729  08/10/19 MJU    Staphylococcus aureus (BCID) NOT DETECTED NOT DETECTED Final   Staphylococcus epidermidis DETECTED (A) NOT DETECTED Final    Comment: CRITICAL RESULT CALLED TO, READ BACK BY AND VERIFIED WITH: ALEX CHAPPELL @1729  08/10/19 MJU    Staphylococcus lugdunensis NOT DETECTED NOT DETECTED Final   Streptococcus species NOT DETECTED NOT DETECTED Final   Streptococcus agalactiae NOT DETECTED NOT DETECTED Final   Streptococcus pneumoniae NOT DETECTED NOT DETECTED Final   Streptococcus pyogenes NOT DETECTED NOT DETECTED Final   A.calcoaceticus-baumannii NOT DETECTED NOT DETECTED Final   Bacteroides fragilis NOT DETECTED NOT DETECTED Final   Enterobacterales NOT DETECTED NOT DETECTED Final   Enterobacter cloacae complex NOT DETECTED NOT DETECTED Final   Escherichia coli NOT DETECTED NOT DETECTED Final   Klebsiella aerogenes NOT DETECTED NOT DETECTED Final   Klebsiella oxytoca NOT DETECTED NOT DETECTED Final   Klebsiella pneumoniae NOT DETECTED NOT DETECTED Final   Proteus species NOT DETECTED NOT DETECTED Final   Salmonella species NOT DETECTED NOT DETECTED Final   Serratia marcescens NOT DETECTED NOT DETECTED Final   Haemophilus influenzae NOT DETECTED NOT DETECTED Final   Neisseria meningitidis NOT DETECTED NOT DETECTED Final   Pseudomonas aeruginosa NOT DETECTED NOT DETECTED Final   Stenotrophomonas maltophilia NOT DETECTED NOT DETECTED Final   Candida albicans NOT DETECTED NOT DETECTED Final   Candida auris NOT DETECTED NOT DETECTED  Final   Candida glabrata NOT DETECTED NOT DETECTED Final   Candida krusei NOT DETECTED NOT DETECTED Final   Candida parapsilosis NOT DETECTED NOT DETECTED Final   Candida tropicalis NOT DETECTED NOT DETECTED Final   Cryptococcus neoformans/gattii NOT DETECTED NOT DETECTED Final   Methicillin resistance mecA/C NOT DETECTED NOT DETECTED Final    Comment: Performed at [facility], [address] [place], [place] [address]  MRSA PCR Screening     Status: None   Collection Time: 08/09/19  8:57 PM   Specimen: Nasal Mucosa; Nasopharyngeal  Result Value Ref Range Status   MRSA by PCR NEGATIVE NEGATIVE Final    Comment:        The GeneXpert MRSA Assay (FDA approved for NASAL specimens only), is one component of a comprehensive MRSA colonization surveillance program. It is not intended to diagnose MRSA infection nor to guide or monitor treatment for MRSA infections. Performed at [facility], [address] [place], [place] [address]   Urine culture     Status: None   Collection Time: 08/10/19  5:00 AM   Specimen: In/Out Cath Urine  Result Value Ref Range Status   Specimen Description   Final    IN/OUT CATH URINE Performed at [name], [address] [place], [place] [address]    Special Requests   Final    NONE Performed at [facility], [address], [place], [place] [address]    Culture   Final    NO GROWTH Performed at [facility], [place] [address] [place], [place] [address]    Report Status 08/11/2019 FINAL  Final      Studies: No results found.  Scheduled Meds: .  feeding supplement (ENSURE ENLIVE)  237 mL Oral TID BM  . [START ON 08/13/2019] pantoprazole  40 mg Intravenous Q12H    Continuous Infusions: . sodium chloride 100 mL/hr at 08/12/19 1355  . ceFEPime (MAXIPIME) IV 2 g (08/12/19 1357)  . metronidazole 500 mg (08/12/19 1240)  . pantoprozole (PROTONIX) infusion 8 mg/hr (08/12/19 1243)     LOS: 3 days      Alma Friendly, MD [facility]  If 7PM-7AM, please contact night-coverage [email or web address] 08/12/2019, 4:26 PM

## 2019-08-12 NOTE — Plan of Care (Signed)
  Problem: Education: Goal: Knowledge of [facility] Education information/materials will improve Outcome: Progressing Goal: Emotional status will improve Outcome: Progressing Goal: Mental status will improve Outcome: Progressing Goal: Verbalization of understanding the information provided will improve Outcome: Progressing   Problem: Activity: Goal: Interest or engagement in activities will improve Outcome: Progressing Goal: Sleeping patterns will improve Outcome: Progressing   

## 2019-08-13 LAB — GASTROINTESTINAL PANEL BY PCR, STOOL (REPLACES STOOL CULTURE)

## 2019-08-13 NOTE — Plan of Care (Signed)
  Problem: Education: Goal: Knowledge of [facility] Education information/materials will improve Outcome: Progressing Goal: Emotional status will improve Outcome: Progressing Goal: Mental status will improve Outcome: Progressing Goal: Verbalization of understanding the information provided will improve Outcome: Progressing   Problem: Activity: Goal: Interest or engagement in activities will improve Outcome: Progressing Goal: Sleeping patterns will improve Outcome: Progressing   Problem: Coping: Goal: Ability to verbalize frustrations and anger appropriately will improve Outcome: Progressing Goal: Ability to demonstrate self-control will improve Outcome: Progressing   Problem: Health Behavior/Discharge Planning: Goal: Identification of resources available to assist in meeting health care needs will improve Outcome: Progressing Goal: Compliance with treatment plan for underlying cause of condition will improve Outcome: Progressing   Problem: Physical Regulation: Goal: Ability to maintain clinical measurements within normal limits will improve Outcome: Progressing   Problem: Safety: Goal: Periods of time without injury will increase Outcome: Progressing   

## 2019-08-13 NOTE — Progress Notes (Signed)
PROGRESS NOTE  Albert [name] SHF:[id number] DOB: 03-18-1933 DOA: 08/09/2019 PCP: [name], MD  HPI/Recap of past 24 hours: HPI from Dr [name] is a 86 year old male with a past medical history significant for HTN, CKD stage III, chronic pain, COVID-19 in 11/2018, and recent admission 08/03/19 to 08/08/2019 for UGI bleed (found to have esophageal and duodenal ulcer, no active bleeding on EGD performed 08/05/2019) presents to [facility] ED on 08/09/2019 due to weakness and melena.  Per patient report, his home health aide was concerned about dark black stools in his diapers. In the ED he was noted to be tachycardic with otherwise stable vital signs.  Initial work-up in the ED revealed WBC 28, lactic acid 4, hemoglobin 9.7, high-sensitivity troponin 43, bicarb 20, creatinine 1.43, BUN 43. CXR with right pleural effusion and bilateral airspace opacities concerning for atelectasis. CTA abdomen and pelvis with no discernible site of active bleeding, diverticulosis, questionable mesenteritis, questionable cystitis, and distended gallbladder.  RUQ Abdominal US showed extensive biliary sludge, but no evidence of gallstones or cholecystitis. COVID-19 PCR is negative.  He was given broad spectrum antibiotics, placed on Octreotide and Protonix drip, and received 1 unit pRBC's.  While in the ED, pt was noted to be hypotensive. PCCM consulted and pt was transferred to the ICU. Pt didn't require any pressors. TRH assumed care on 08/11/19.     Today, patient denies any new complaints, diarrhea noted to be improving.  Patient denied any chest pain, shortness of breath, abdominal pain, nausea/vomiting, fever/chills.    Assessment/Plan: Active Problems:   GI bleed   GIB (gastrointestinal bleeding)   Protein-calorie malnutrition, severe   ??Possible GI bleed Recent admission for GI bleed and anemia on 08/03/2019 Melena likely 2/2 old blood from recent GI bleed EGD done on 08/05/2019 showed esophageal ulcer  and 2 duodenal ulcers with no stigmata of bleeding CTA abdomen/pelvis done on 08/09/2019 showed no active bleeding, duodenal and colonic diverticulosis without evidence of active diverticular inflammation or bleed GI consulted, no further plans to do any intervention as hemoglobin remained stable and melena is likely due to passage of old blood from recent bleed Continue PPI Monitor CBC closely  Acute on chronic blood loss anemia Baseline hemoglobin around 9 S/p 1 units of pRBC on 08/09/2019 Monitor closely Daily CBC  ?? Possible severe sepsis criteria on admission ?? Intravascular volume depletion Tachycardia, hypotensive, lactic acidosis, leukocytosis on admission No identifiable source of possible sepsis Currently afebrile, with resolving leukocytosis LA currently WNL Procalcitonin 0.19->0.36->0.33 Blood culture 1/4 bottle growing staph epidermidis, likely contaminant UA with large leukocytes, greater than 50 WBC, UC with no growth, although taken after antibiotic was started Chest x-ray with bilateral opacities, more consistent with atelectasis CTA abdomen/pelvis with ?  Mesenteritis, cystitis Right upper quadrant abdominal ultrasound, no evidence of gallstones or cholecystitis Continue cefepime, Flagyl, will DC vancomycin for now (MRSA by PCR negative) Monitor closely  Hypotension History of hypertension Likely part of sepsis phenomenon versus intravascular volume depletion Continue IV fluids, never required pressors Monitor closely  Diarrhea Noted loose stools, no fever, resolving leukocytosis GI panel unremarkable  CKD stage IIIa/metabolic acidosis Daily BMP        Malnutrition Type:  Nutrition Problem: Severe Malnutrition Etiology: chronic illness (CKD, hx COVID-19 11/2018, inadequate oral intake)   Malnutrition Characteristics:  Signs/Symptoms: severe fat depletion, severe muscle depletion   Nutrition Interventions:  Interventions: Refer to RD note for  recommendations    Estimated body mass index is 26 kg/m as  calculated from the following:   Height as of this encounter: 5\' 4"  (1.626 m).   Weight as of this encounter: 68.7 kg.     Code Status: Full  Family Communication: None at bedside  Disposition Plan: Status is: Inpatient  Remains inpatient appropriate because:Inpatient level of care appropriate due to severity of illness   Dispo: The patient is from: Home              Anticipated d/c is to: SNF              Anticipated d/c date is: 2 days              Patient currently is not medically stable to d/c.    Consultants:  GI  PCCM  Procedures:  None  Antimicrobials:  Cefepime  Flagyl  DVT prophylaxis: Lovenox    Objective: Vitals:   08/13/19 0500 08/13/19 0505 08/13/19 0800 08/13/19 1200  BP:  (!) 97/56 108/62 106/60  Pulse:  78 79 64  Resp:  18 14 17   Temp:  98.6 F (37 C) 98.2 F (36.8 C) 98.3 F (36.8 C)  TempSrc:  Oral Oral Oral  SpO2:  100% 100% 99%  Weight: 68.7 kg     Height:        Intake/Output Summary (Last 24 hours) at 08/13/2019 1546 Last data filed at 08/13/2019 0631 Gross per 24 hour  Intake 2867.19 ml  Output 1150 ml  Net 1717.19 ml   Filed Weights   08/09/19 1616 08/11/19 0500 08/13/19 0500  Weight: 62 kg 62.1 kg 68.7 kg    Exam:  General: NAD, chronically ill-appearing, deconditioned  Cardiovascular: S1, S2 present  Respiratory: CTAB  Abdomen: Soft, nontender, nondistended, bowel sounds present  Musculoskeletal: No bilateral pedal edema noted  Skin: Normal  Psychiatry: Normal mood   Data Reviewed: CBC: Recent Labs  Lab 08/09/19 1727 08/09/19 1727 08/09/19 2057 08/10/19 0018 08/10/19 0500 08/10/19 0500 08/10/19 1130 08/10/19 1744 08/10/19 2348 08/11/19 0422 08/12/19 0901  WBC 25.5*  --  19.5*  --  17.1*  --   --   --   --  11.3* 9.3  NEUTROABS 21.8*  --  17.0*  --   --   --   --   --   --   --   --   HGB 8.9*   < > 7.5*   < > 8.2*   < > 8.1* 8.1*  8.3* 8.3* 8.9*  HCT 28.0*   < > 23.4*   < > 24.4*   < > 25.4* 25.1* 25.0* 24.7* 27.4*  MCV 93.3  --  93.2  --  88.7  --   --   --   --  89.5 90.4  PLT 208  --  145*  --  151  --   --   --   --  159 165   < > = values in this interval not displayed.   Basic Metabolic Panel: Recent Labs  Lab 08/09/19 1624 08/09/19 1727 08/10/19 0500 08/11/19 0422 08/12/19 0901  NA 138 140 139 140 142  K 4.3 4.8 4.1 3.8 3.6  CL 106 107 110 113* 113*  CO2 20* 21* 20* 20* 21*  GLUCOSE 100* 96 108* 120* 95  BUN 43* 44* 41* 35* 30*  CREATININE 1.43* 1.54* 1.28* 1.39* 1.30*  CALCIUM 7.9* 7.8* 7.3* 7.0* 7.0*  MG  --  1.5*  --   --   --    GFR: Estimated Creatinine  Clearance: 35.4 mL/min (A) (by C-G formula based on SCr of 1.3 mg/dL (H)). Liver Function Tests: Recent Labs  Lab 08/09/19 1624 08/09/19 1727 08/10/19 0500 08/11/19 0422 08/12/19 0901  AST 47* 47* 30 27 26   ALT 20 18 16 16 15   ALKPHOS 90 85 68 95 101  BILITOT 1.3* 1.3* 1.4* 1.1 1.1  PROT 5.6* 5.4* 4.3* 4.3* 4.7*  ALBUMIN 2.6* 2.6* 1.9* 1.9* 2.0*   No results for input(s): LIPASE, AMYLASE in the last 168 hours. No results for input(s): AMMONIA in the last 168 hours. Coagulation Profile: Recent Labs  Lab 08/09/19 1727  INR 1.0   Cardiac Enzymes: No results for input(s): CKTOTAL, CKMB, CKMBINDEX, TROPONINI in the last 168 hours. BNP (last 3 results) No results for input(s): PROBNP in the last 8760 hours. HbA1C: No results for input(s): HGBA1C in the last 72 hours. CBG: No results for input(s): GLUCAP in the last 168 hours. Lipid Profile: No results for input(s): CHOL, HDL, LDLCALC, TRIG, CHOLHDL, LDLDIRECT in the last 72 hours. Thyroid Function Tests: No results for input(s): TSH, T4TOTAL, FREET4, T3FREE, THYROIDAB in the last 72 hours. Anemia Panel: No results for input(s): VITAMINB12, FOLATE, FERRITIN, TIBC, IRON, RETICCTPCT in the last 72 hours. Urine analysis:    Component Value Date/Time   COLORURINE YELLOW (A)  08/10/2019 0500   APPEARANCEUR HAZY (A) 08/10/2019 0500   LABSPEC 1.021 08/10/2019 0500   PHURINE 5.0 08/10/2019 0500   GLUCOSEU NEGATIVE 08/10/2019 0500   HGBUR SMALL (A) 08/10/2019 0500   BILIRUBINUR NEGATIVE 08/10/2019 0500   KETONESUR NEGATIVE 08/10/2019 0500   PROTEINUR NEGATIVE 08/10/2019 0500   NITRITE NEGATIVE 08/10/2019 0500   LEUKOCYTESUR LARGE (A) 08/10/2019 0500   Sepsis Labs: @LABRCNTIP (procalcitonin:4,lacticidven:4)  ) Recent Results (from the past 240 hour(s))  SARS Coronavirus 2 by RT PCR (hospital order, performed in [place] hospital lab) Nasopharyngeal Nasopharyngeal Swab     Status: None   Collection Time: 08/03/19 10:53 PM   Specimen: Nasopharyngeal Swab  Result Value Ref Range Status   SARS Coronavirus 2 NEGATIVE NEGATIVE Final    Comment: (NOTE) SARS-CoV-2 target nucleic acids are NOT DETECTED.  The SARS-CoV-2 RNA is generally detectable in upper and lower respiratory specimens during the acute phase of infection. The lowest concentration of SARS-CoV-2 viral copies this assay can detect is 250 copies / mL. A negative result does not preclude SARS-CoV-2 infection and should not be used as the sole basis for treatment or other patient management decisions.  A negative result may occur with improper specimen collection / handling, submission of specimen other than nasopharyngeal swab, presence of viral mutation(s) within the areas targeted by this assay, and inadequate number of viral copies (<250 copies / mL). A negative result must be combined with clinical observations, patient history, and epidemiological information.  Fact Sheet for Patients:   StrictlyIdeas.no  Fact Sheet for Healthcare Providers: BankingDealers.co.za  This test is not yet approved or  cleared by the [place] FDA and has been authorized for detection and/or diagnosis of SARS-CoV-2 by FDA under an Emergency Use Authorization  (EUA).  This EUA will remain in effect (meaning this test can be used) for the duration of the COVID-19 declaration under Section 564(b)(1) of the Act, 21 U.S.C. section 360bbb-3(b)(1), unless the authorization is terminated or revoked sooner.  Performed at [facility], [address] [place], [place] [address]   SARS Coronavirus 2 by RT PCR (hospital order, performed in [place] lab) Nasopharyngeal Nasopharyngeal Swab  Status: None   Collection Time: 08/09/19  5:27 PM   Specimen: Nasopharyngeal Swab  Result Value Ref Range Status   SARS Coronavirus 2 NEGATIVE NEGATIVE Final    Comment: (NOTE) SARS-CoV-2 target nucleic acids are NOT DETECTED.  The SARS-CoV-2 RNA is generally detectable in upper and lower respiratory specimens during the acute phase of infection. The lowest concentration of SARS-CoV-2 viral copies this assay can detect is 250 copies / mL. A negative result does not preclude SARS-CoV-2 infection and should not be used as the sole basis for treatment or other patient management decisions.  A negative result may occur with improper specimen collection / handling, submission of specimen other than nasopharyngeal swab, presence of viral mutation(s) within the areas targeted by this assay, and inadequate number of viral copies (<250 copies / mL). A negative result must be combined with clinical observations, patient history, and epidemiological information.  Fact Sheet for Patients:   StrictlyIdeas.no  Fact Sheet for Healthcare Providers: BankingDealers.co.za  This test is not yet approved or  cleared by the [place] FDA and has been authorized for detection and/or diagnosis of SARS-CoV-2 by FDA under an Emergency Use Authorization (EUA).  This EUA will remain in effect (meaning this test can be used) for the duration of the COVID-19 declaration under Section 564(b)(1) of the Act, 21  U.S.C. section 360bbb-3(b)(1), unless the authorization is terminated or revoked sooner.  Performed at [facility], [address] [place], [place] [address]   Blood Culture (routine x 2)     Status: None (Preliminary result)   Collection Time: 08/09/19  5:28 PM   Specimen: BLOOD  Result Value Ref Range Status   Specimen Description BLOOD BLOOD LEFT HAND  Final   Special Requests   Final    BOTTLES DRAWN AEROBIC AND ANAEROBIC Blood Culture results may not be optimal due to an inadequate volume of blood received in culture bottles   Culture   Final    NO GROWTH 4 DAYS Performed at [facility], [address] [place], [place] [address]    Report Status PENDING  Incomplete  Blood Culture (routine x 2)     Status: Abnormal   Collection Time: 08/09/19  5:28 PM   Specimen: BLOOD  Result Value Ref Range Status   Specimen Description   Final    BLOOD LEFT ANTECUBITAL Performed at [facility], [address] [place], [place] [address]    Special Requests   Final    BOTTLES DRAWN AEROBIC AND ANAEROBIC Blood Culture results may not be optimal due to an inadequate volume of blood received in culture bottles Performed at [facility], [address] [place], [place] [address]    Culture  Setup Time   Final    Organism ID to follow [place] TO, READ BACK BY AND VERIFIED WITH: [place] @1729  08/10/19 MJU Performed at [facility], [address] [place], [place] [address]    Culture (A)  Final    STAPHYLOCOCCUS EPIDERMIDIS THE SIGNIFICANCE OF ISOLATING THIS ORGANISM FROM A SINGLE SET OF BLOOD CULTURES WHEN MULTIPLE SETS ARE DRAWN IS UNCERTAIN. PLEASE NOTIFY THE MICROBIOLOGY DEPARTMENT WITHIN ONE WEEK IF SPECIATION AND SENSITIVITIES ARE REQUIRED. Performed at [facility], [place] [address] [place], [place] [address]    Report Status 08/11/2019 FINAL  Final  Blood Culture  ID Panel (Reflexed)     Status: Abnormal   Collection Time: 08/09/19  5:28 PM  Result Value Ref Range Status   Enterococcus faecalis NOT DETECTED NOT DETECTED Final   Enterococcus Faecium NOT DETECTED NOT DETECTED Final   Listeria monocytogenes NOT DETECTED NOT DETECTED Final   Staphylococcus species DETECTED (A) NOT DETECTED Final    Comment: CRITICAL RESULT CALLED TO, READ BACK BY AND VERIFIED WITH: ALEX [place] @1729  08/10/19 MJU    Staphylococcus aureus (BCID) NOT DETECTED NOT DETECTED Final   Staphylococcus epidermidis DETECTED (A) NOT DETECTED Final    Comment: CRITICAL RESULT CALLED TO, READ BACK BY AND VERIFIED WITH: ALEX CHAPPELL @1729  08/10/19 MJU    Staphylococcus lugdunensis NOT DETECTED NOT DETECTED Final   Streptococcus species NOT DETECTED NOT DETECTED Final   Streptococcus agalactiae NOT DETECTED NOT DETECTED Final   Streptococcus pneumoniae NOT DETECTED NOT DETECTED Final   Streptococcus pyogenes NOT DETECTED NOT DETECTED Final   A.calcoaceticus-baumannii NOT DETECTED NOT DETECTED Final   Bacteroides fragilis NOT DETECTED NOT DETECTED Final   Enterobacterales NOT DETECTED NOT DETECTED Final   Enterobacter cloacae complex NOT DETECTED NOT DETECTED Final   Escherichia coli NOT DETECTED NOT DETECTED Final   Klebsiella aerogenes NOT DETECTED NOT DETECTED Final   Klebsiella oxytoca NOT DETECTED NOT DETECTED Final   Klebsiella pneumoniae NOT DETECTED NOT DETECTED Final   Proteus species NOT DETECTED NOT DETECTED Final   Salmonella species NOT DETECTED NOT DETECTED Final   Serratia marcescens NOT DETECTED NOT DETECTED Final   Haemophilus influenzae NOT DETECTED NOT DETECTED Final   Neisseria meningitidis NOT DETECTED NOT DETECTED Final   Pseudomonas aeruginosa NOT DETECTED NOT DETECTED Final   Stenotrophomonas maltophilia NOT DETECTED NOT DETECTED Final   Candida albicans NOT DETECTED NOT DETECTED Final   Candida auris NOT DETECTED NOT DETECTED Final   Candida glabrata NOT  DETECTED NOT DETECTED Final   Candida krusei NOT DETECTED NOT DETECTED Final   Candida parapsilosis NOT DETECTED NOT DETECTED Final   Candida tropicalis NOT DETECTED NOT DETECTED Final   Cryptococcus neoformans/gattii NOT DETECTED NOT DETECTED Final   Methicillin resistance mecA/C NOT DETECTED NOT DETECTED Final    Comment: Performed at [facility], [address] [place], [place] [address]  MRSA PCR Screening     Status: None   Collection Time: 08/09/19  8:57 PM   Specimen: Nasal Mucosa; Nasopharyngeal  Result Value Ref Range Status   MRSA by PCR NEGATIVE NEGATIVE Final    Comment:        The GeneXpert MRSA Assay (FDA approved for NASAL specimens only), is one component of a comprehensive MRSA colonization surveillance program. It is not intended to diagnose MRSA infection nor to guide or monitor treatment for MRSA infections. Performed at [facility], [address], [place], [place] [address]   Urine culture     Status: None   Collection Time: 08/10/19  5:00 AM   Specimen: In/Out Cath Urine  Result Value Ref Range Status   Specimen Description   Final    IN/OUT CATH URINE Performed at [facility], [address], [place], [place] [address]    Special Requests   Final    NONE Performed at [facility], [address] [place], [place] [address]    Culture   Final    NO GROWTH Performed at [facility], [place] [address], [place], [place] [address]    Report Status 08/11/2019 FINAL  Final  Gastrointestinal Panel by PCR , Stool     Status: None   Collection Time:  08/12/19  4:31 PM   Specimen: Stool  Result Value Ref Range Status   Campylobacter species NOT DETECTED NOT DETECTED Final   Plesimonas shigelloides NOT DETECTED NOT DETECTED Final   Salmonella species NOT DETECTED NOT DETECTED Final   Yersinia enterocolitica NOT DETECTED NOT DETECTED Final   Vibrio species NOT DETECTED NOT DETECTED Final   Vibrio cholerae NOT  DETECTED NOT DETECTED Final   Enteroaggregative E coli (EAEC) NOT DETECTED NOT DETECTED Final   Enteropathogenic E coli (EPEC) NOT DETECTED NOT DETECTED Final   Enterotoxigenic E coli (ETEC) NOT DETECTED NOT DETECTED Final   Shiga like toxin producing E coli (STEC) NOT DETECTED NOT DETECTED Final   Shigella/Enteroinvasive E coli (EIEC) NOT DETECTED NOT DETECTED Final   Cryptosporidium NOT DETECTED NOT DETECTED Final   Cyclospora cayetanensis NOT DETECTED NOT DETECTED Final   Entamoeba histolytica NOT DETECTED NOT DETECTED Final   Giardia lamblia NOT DETECTED NOT DETECTED Final   Adenovirus F40/41 NOT DETECTED NOT DETECTED Final   Astrovirus NOT DETECTED NOT DETECTED Final   Norovirus GI/GII NOT DETECTED NOT DETECTED Final   Rotavirus A NOT DETECTED NOT DETECTED Final   Sapovirus (I, II, IV, and V) NOT DETECTED NOT DETECTED Final    Comment: Performed at [facility], [address], [place], [place] [address]      Studies: No results found.  Scheduled Meds: . enoxaparin (LOVENOX) injection  40 mg Subcutaneous Q24H  . feeding supplement (ENSURE ENLIVE)  237 mL Oral TID BM  . pantoprazole  40 mg Intravenous Q12H    Continuous Infusions: . sodium chloride 75 mL/hr at 08/13/19 1056  . ceFEPime (MAXIPIME) IV 2 g (08/13/19 1421)  . metronidazole 500 mg (08/13/19 1038)     LOS: 4 days     Alma Friendly, MD [facility]  If 7PM-7AM, please contact night-coverage [email or web address] 08/13/2019, 3:46 PM

## 2019-08-13 NOTE — Progress Notes (Signed)
At the beginning of the shift, patient had two PIVs, one in the left FA and one in the Right wrist and both were infusing due to IV meds that were not compatible.  PIV in left arm was tender and had infiltrated and therefore was removed. Elevated arm and placed a warm compress for comfort.  Inserted a new PIV in the right FA and restarted infusion. The right FA PIV infused for about four hours before that IV became infiltrated and patient states it  was mildly tender . Luckily the IV pantoprazole order had ended and therefore right FA PIV was removed. The Right wrist was the only IV infusing and then later become occluded. Paged IV team. IV Team RN was able to insert a new PIV in the Right hand. Restarted infusion and patient is tolerating it well.

## 2019-08-14 DIAGNOSIS — E43 Unspecified severe protein-calorie malnutrition: Secondary | ICD-10-CM

## 2019-08-14 LAB — CBC
HCT: 25.6 % — ABNORMAL LOW (ref 39.0–52.0)
Hemoglobin: 8.1 g/dL — ABNORMAL LOW (ref 13.0–17.0)
MCH: 29 pg (ref 26.0–34.0)
MCHC: 31.6 g/dL (ref 30.0–36.0)
MCV: 91.8 fL (ref 80.0–100.0)
Platelets: 165 10*3/uL (ref 150–400)
RBC: 2.79 MIL/uL — ABNORMAL LOW (ref 4.22–5.81)
RDW: 16.3 % — ABNORMAL HIGH (ref 11.5–15.5)
WBC: 9.4 10*3/uL (ref 4.0–10.5)
nRBC: 0 % (ref 0.0–0.2)

## 2019-08-14 LAB — BASIC METABOLIC PANEL
Anion gap: 3 — ABNORMAL LOW (ref 5–15)
BUN: 23 mg/dL (ref 8–23)
CO2: 19 mmol/L — ABNORMAL LOW (ref 22–32)
Calcium: 7 mg/dL — ABNORMAL LOW (ref 8.9–10.3)
Chloride: 118 mmol/L — ABNORMAL HIGH (ref 98–111)
Creatinine, Ser: 1.26 mg/dL — ABNORMAL HIGH (ref 0.61–1.24)
GFR calc Af Amer: >60 mL/min (ref >60)
GFR calc non Af Amer: 52 mL/min — ABNORMAL LOW (ref >60)
Glucose, Bld: 112 mg/dL — ABNORMAL HIGH (ref 70–99)
Potassium: 3.7 mmol/L (ref 3.5–5.1)
Sodium: 140 mmol/L (ref 135–145)

## 2019-08-14 LAB — CULTURE, BLOOD (ROUTINE X 2): Culture: NO GROWTH

## 2019-08-14 LAB — SARS CORONAVIRUS 2 BY RT PCR (HOSPITAL ORDER, PERFORMED IN ~~LOC~~ HOSPITAL LAB): SARS Coronavirus 2: NEGATIVE

## 2019-08-14 MED ORDER — CEFDINIR 300 MG PO CAPS: 300.0000 mg | ORAL_CAPSULE | Freq: Two times a day (BID) | ORAL | 0 refills | Status: AC

## 2019-08-14 MED ORDER — METRONIDAZOLE 500 MG PO TABS
500.0000 mg | ORAL_TABLET | Freq: Three times a day (TID) | ORAL | Status: DC
  Administered 2019-08-14: 500 mg via ORAL
  Filled 2019-08-14: qty 1

## 2019-08-14 MED ORDER — CEFDINIR 300 MG PO CAPS
300.0000 mg | ORAL_CAPSULE | Freq: Two times a day (BID) | ORAL | Status: DC
  Administered 2019-08-14: 300 mg via ORAL
  Filled 2019-08-14 (×2): qty 1

## 2019-08-14 MED ORDER — METRONIDAZOLE 500 MG PO TABS: 500.0000 mg | ORAL_TABLET | Freq: Three times a day (TID) | ORAL | 0 refills | Status: AC

## 2019-08-14 NOTE — TOC Progression Note (Signed)
Transition of Care [facility]) - Progression Note    Patient Details  Name: Albert [name] MRN: [id number] Date of Birth: Oct 02, 1933  Transition of Care [facility]) CM/SW Contact  Shelbie Hutching, RN Phone Number: 08/14/2019, 3:24 PM  Clinical Narrative:    [facility] transport arranged, patient is 3rd on the list for pick up.    Expected Discharge Plan: Skilled Nursing Facility Barriers to Discharge: Barriers Resolved  Expected Discharge Plan and Services Expected Discharge Plan: [place]   Discharge Planning Services: CM Consult   Living arrangements for the past 2 months: Single Family Home Expected Discharge Date: 08/14/19                                     Social Determinants of Health (SDOH) Interventions    Readmission Risk Interventions No flowsheet data found.

## 2019-08-14 NOTE — TOC Progression Note (Signed)
Transition of Care [facility]) - Progression Note    Patient Details  Name: Albert [name] MRN: [id number] Date of Birth: [birth year]  Transition of Care [facility]) CM/SW Contact  Shelbie Hutching, RN Phone Number: 08/14/2019, 10:40 AM  Clinical Narrative:    [name] a bed and patient accepts.     Expected Discharge Plan: [place] Barriers to Discharge: No Barriers Identified  Expected Discharge Plan and Services Expected Discharge Plan: [place]   Discharge Planning Services: CM Consult   Living arrangements for the past 2 months: Single Family Home                                       Social Determinants of Health (SDOH) Interventions    Readmission Risk Interventions No flowsheet data found.

## 2019-08-14 NOTE — TOC Transition Note (Signed)
Transition of Care [facility]) - CM/SW Discharge Note   Patient Details  Name: Albert [name] MRN: [id number] Date of Birth: 07-22-1933  Transition of Care [facility]) CM/SW Contact:  Shelbie Hutching, RN Phone Number: 08/14/2019, 12:46 PM   Clinical Narrative:    Face sheet and PCS documentation printed out and placed in DC folder.   Final next level of care: Skilled Nursing Facility Barriers to Discharge: Barriers Resolved   Patient Goals and CMS Choice   CMS Medicare.gov Compare Post Acute Care list provided to:: Patient Choice offered to / list presented to : Patient  Discharge Placement   Existing PASRR number confirmed : 08/11/19          Patient [facility] bed at: [facility] Patient to be transferred to facility by: [facility] Name of family member notified: Patient will notify famiy Patient and family notified of of transfer: 08/14/19  Discharge Plan and Services   Discharge Planning Services: CM Consult                                 Social Determinants of Health ([place]) Interventions     Readmission Risk Interventions No flowsheet data found.

## 2019-08-14 NOTE — TOC Transition Note (Signed)
Transition of Care [facility]) - CM/SW Discharge Note   Patient Details  Name: Albert [name] MRN: [id number] Date of Birth: 11-01-1933  Transition of Care [facility]) CM/SW Contact:  Shelbie Hutching, RN Phone Number: 08/14/2019, 12:39 PM   Clinical Narrative:     Patient is medically cleared for discharge to skilled nursing for rehab.  Patient will discharge to [facility].  Bedside RN will call report to [phone].  This RNCM will arrange EMS transport once discharge has been completed.    Final next level of care: Skilled Nursing Facility Barriers to Discharge: Barriers Resolved   Patient Goals and CMS Choice   CMS Medicare.gov Compare Post Acute Care list provided to:: Patient Choice offered to / list presented to : Patient  Discharge Placement   Existing PASRR number confirmed : 08/11/19          Patient [facility] bed at: [facility] Patient to be transferred to facility by: [facility] Name of family member notified: Patient will notify famiy Patient and family notified of of transfer: 08/14/19  Discharge Plan and Services   Discharge Planning Services: CM Consult                                 Social Determinants of Health ([place]) Interventions     Readmission Risk Interventions No flowsheet data found.

## 2019-08-14 NOTE — Discharge Summary (Signed)
Discharge Summary  Albert [name] XHB:[id number] DOB: 1933/07/12  PCP: [name], MD  Admit date: 08/09/2019 Discharge date: 08/14/2019  Time spent: 40 mins  Recommendations for Outpatient Follow-up:  1. Follow-up with PCP  Discharge Diagnoses:  Active Hospital Problems   Diagnosis Date Noted  . Protein-calorie malnutrition, severe 08/12/2019  . GIB (gastrointestinal bleeding) 08/10/2019  . GI bleed 08/09/2019    Resolved Hospital Problems  No resolved problems to display.    Discharge Condition: Stable  Diet recommendation: Heart healthy  Vitals:   08/14/19 0742 08/14/19 1220  BP: (!) 126/59 121/74  Pulse: 87 80  Resp: 16 17  Temp: 97.8 F (36.6 C) 97.8 F (36.6 C)  SpO2: 99% 100%    History of present illness:  Albert [name] is a 86 year old male with a past medical history significant for HTN, CKD stage III, chronic pain, COVID-19 in 11/2018,and recent admission 08/03/19 to8/03/2019 for UGI bleed(found to have esophageal and duodenal ulcer,no active bleeding on EGD performed 08/05/2019)presents to [facility] ED on 08/09/2019 due to weakness and melena. Per patient report, his home health aide was concerned about dark black stools in his diapers. In the ED he was noted to be tachycardic with otherwise stable vital signs. Initial work-up in the ED revealed WBC 28, lactic acid 4, hemoglobin 9.7, high-sensitivity troponin 43, bicarb 20, creatinine 1.43, BUN 43. CXR with right pleural effusion and bilateral airspace opacities concerning for atelectasis. CTAabdomen and pelvis with no discernible site of active bleeding, diverticulosis, questionable mesenteritis, questionable cystitis, and distended gallbladder. RUQ Abdominal US showed extensive biliary sludge, but no evidence of gallstones or cholecystitis. COVID-19 PCR is negative. He was given broad spectrum antibiotics, placed on Octreotide and Protonix drip, and received 1 unit pRBC's. While in the ED, pt was noted to be  hypotensive. PCCM consulted and pt was transferred to the ICU. Pt didn't require any pressors. [facility] assumed care on 08/11/19.    Today, patient denies any new complaints.  Patient denies any abdominal pain, nausea/vomiting, fever/chills, melena, hematochezia, chest pain, shortness of breath.    Hospital Course:  Active Problems:   GI bleed   GIB (gastrointestinal bleeding)   Protein-calorie malnutrition, severe   ??Possible GI bleed Recent admission for GI bleed and anemia on 08/03/2019 Melena likely 2/2 old blood from recent GI bleed EGD done on 08/05/2019 showed esophageal ulcer and 2 duodenal ulcers with no stigmata of bleeding CTA abdomen/pelvis done on 08/09/2019 showed no active bleeding, duodenal and colonic diverticulosis without evidence of active diverticular inflammation or bleed GI consulted, no further plans to do any intervention as hemoglobin remained stable and melena is likely due to passage of old blood from recent bleed Continue PPI PCP follow-up  Acute on chronic blood loss anemia Baseline hemoglobin around 9, remained stable status post transfusion S/p 1 units of pRBC on 08/09/2019  ?? Possible severe sepsis criteria on admission ?? Intravascular volume depletion Tachycardia, hypotensive, lactic acidosis, leukocytosis on admission No identifiable source of possible sepsis, ?mesenteritis Currently afebrile, with resolved leukocytosis LA currently WNL Procalcitonin 0.19->0.36->0.33 Blood culture 1/4 bottle growing staph epidermidis, likely contaminant UA with large leukocytes, greater than 50 WBC, UC with no growth, although taken after antibiotic was started Chest x-ray with bilateral opacities, more consistent with atelectasis CTA abdomen/pelvis with ?  Mesenteritis, cystitis Right upper quadrant abdominal ultrasound, no evidence of gallstones or cholecystitis S/P cefepime, Flagyl, vancomycin, switch to PO cefdinir + flagyl for 2 more days to complete 7 days  total on 08/1119  Hypotension Improved History of hypertension Likely part of sepsis phenomenon versus intravascular volume depletion Monitor closely  Diarrhea Noted loose stools, no fever, resolved leukocytosis GI panel unremarkable  CKD stage IIIa/metabolic acidosis Stable           Malnutrition Type:  Nutrition Problem: Severe Malnutrition Etiology: chronic illness (CKD, hx COVID-19 11/2018, inadequate oral intake)   Malnutrition Characteristics:  Signs/Symptoms: severe fat depletion, severe muscle depletion   Nutrition Interventions:  Interventions: Refer to RD note for recommendations   Estimated body mass index is 26.41 kg/m as calculated from the following:   Height as of this encounter: 5\' 4"  (1.626 m).   Weight as of this encounter: 69.8 kg.    Procedures:  None  Consultations:  GI  PCCM  Discharge Exam: BP 121/74 (BP Location: Right Arm)   Pulse 80   Temp 97.8 F (36.6 C)   Resp 17   Ht 5\' 4"  (1.626 m)   Wt 69.8 kg   SpO2 100%   BMI 26.41 kg/m   General: NAD Cardiovascular: S1, S2 present Respiratory: CTA B Abdomen: Soft, nontender, nondistended, bowel sounds present    Discharge Instructions You were cared for by a hospitalist during your hospital stay. If you have any questions about your discharge medications or the care you received while you were in the hospital after you are discharged, you can call the unit and asked to speak with the hospitalist on call if the hospitalist that took care of you is not available. Once you are discharged, your primary care physician will handle any further medical issues. Please note that NO REFILLS for any discharge medications will be authorized once you are discharged, as it is imperative that you return to your primary care physician (or establish a relationship with a primary care physician if you do not have one) for your aftercare needs so that they can reassess your need for  medications and monitor your lab values.  Discharge Instructions    Diet - low sodium heart healthy   Complete by: As directed    Discharge wound care:   Complete by: As directed    Daily wound assessment and dressing   Increase activity slowly   Complete by: As directed      Allergies as of 08/14/2019      Reactions   Penicillins    POA reports penicillin as allergy but unsure of reaction      Medication List    TAKE these medications   aspirin EC 81 MG tablet Take 1 tablet (81 mg total) by mouth daily. Swallow whole.start 08/19/19   atorvastatin 20 MG tablet Commonly known as: LIPITOR Take 20 mg by mouth daily.   cefdinir 300 MG capsule Commonly known as: OMNICEF Take 1 capsule (300 mg total) by mouth every 12 (twelve) hours for 2 days.   metroNIDAZOLE 500 MG tablet Commonly known as: FLAGYL Take 1 tablet (500 mg total) by mouth every 8 (eight) hours for 2 days.   multivitamin with minerals Tabs tablet Take 1 tablet by mouth daily.   ondansetron 4 MG tablet Commonly known as: ZOFRAN Take 1 tablet (4 mg total) by mouth every 6 (six) hours as needed for nausea.   pantoprazole 40 MG tablet Commonly known as: PROTONIX Take 1 tablet (40 mg total) by mouth daily.            Discharge Care Instructions  (From admission, onward)         Start  Ordered   08/14/19 0000  Discharge wound care:       Comments: Daily wound assessment and dressing   08/14/19 1257         Allergies  Allergen Reactions  . Penicillins     POA reports penicillin as allergy but unsure of reaction    Contact information for follow-up providers    [name], MD. Schedule an appointment as soon as possible for a visit in 1 week(s).   Specialty: Family Medicine Contact information: [address] [address] [phone]            Contact information for after-discharge care    Destination    [facility] .   Service: Skilled  Nursing Contact information: [address] [place] [phone]                   The results of significant diagnostics from this hospitalization (including imaging, microbiology, ancillary and laboratory) are listed below for reference.    Significant Diagnostic Studies: US RENAL  Result Date: 08/04/2019 CLINICAL DATA:  Acute on chronic renal failure EXAM: RENAL / URINARY TRACT ULTRASOUND COMPLETE COMPARISON:  None. FINDINGS: Right Kidney: Renal measurements: 8.6 x 4.0 x 3.7 cm = volume: 67 mL. Mildly increased echogenicity seen throughout. A 1.4 cm shadowing calculus seen within the midpole. No hydronephrosis. Left Kidney: Renal measurements: 8.9 x 3.8 x 3.6 cm. = volume: 63 mL. Mildly increased echogenicity seen throughout. No mass or hydronephrosis visualized. Bladder: Appears normal for degree of bladder distention. Other: Mildly enlarged prostate gland is seen. IMPRESSION: Nonobstructing right renal calculus. Diffusely increased parenchymal echogenicity, consistent with medical renal disease. Electronically Signed   By: Prudencio Pair M.D.   On: 08/04/2019 01:33   DG Chest Port 1 View  Result Date: 08/09/2019 CLINICAL DATA:  Sepsis. EXAM: PORTABLE CHEST 1 VIEW COMPARISON:  August 03, 2019 FINDINGS: There is a new small right-sided pleural effusion. There are bibasilar airspace opacities favored to represent areas of atelectasis. The heart size is unchanged. There are aortic calcifications. There is no pneumothorax. IMPRESSION: 1. New small right-sided pleural effusion. 2. Bibasilar airspace opacities favored to represent areas of atelectasis. Electronically Signed   By: Constance Holster M.D.   On: 08/09/2019 17:32   DG Chest Portable 1 View  Result Date: 08/03/2019 CLINICAL DATA:  Weakness EXAM: PORTABLE CHEST 1 VIEW COMPARISON:  11/22/2018 FINDINGS: No focal opacity or pleural effusion. Probable scarring in the left upper lung. Normal  cardiomediastinal silhouette with aortic atherosclerosis. No pneumothorax. IMPRESSION: No active disease. Electronically Signed   By: Donavan Foil M.D.   On: 08/03/2019 23:20   ECHOCARDIOGRAM COMPLETE  Result Date: 08/06/2019    ECHOCARDIOGRAM REPORT   Patient Name:   TOBIAH CELESTINE Date of Exam: 08/06/2019 Medical Rec #:  [id number]       Height:       64.0 in Accession #:    [id number]      Weight:       132.3 lb Date of Birth:  October 05, 1933        BSA:          1.641 m Patient Age:    41 years        BP:           101/65 mmHg Patient Gender: M               HR:  77 bpm. Exam Location:  [facility] Procedure: 2D Echo Indications:     ABNORMAL ECG 794.31/R94.31  History:         Patient has no prior history of Echocardiogram examinations.                  Risk Factors:Hypertension. CKD.  Sonographer:     Avanell Shackleton Referring Phys:  [id number] [place] Diagnosing Phys: Kirk Ruths MD  Sonographer Comments: Technically difficult study due to poor echo windows. Image acquisition challenging due to uncooperative patient. TDS. IT WAS DIFFICULT TO GET ADEQUATE ASSESSMENT OF THE AOV. PT HAS LIMITED MOBILITY DUE RA. IMPRESSIONS  1. Left ventricular ejection fraction, by estimation, is 60 to 65%. The left ventricle has normal function. The left ventricle has no regional wall motion abnormalities. There is mild left ventricular hypertrophy. Left ventricular diastolic parameters are consistent with Grade I diastolic dysfunction (impaired relaxation).  2. Right ventricular systolic function is normal. The right ventricular size is moderately enlarged.  3. Left atrial size was severely dilated.  4. Right atrial size was mildly dilated.  5. The mitral valve is normal in structure. Mild mitral valve regurgitation. No evidence of mitral stenosis.  6. The aortic valve has an indeterminant number of cusps. Aortic valve regurgitation is mild. Mild to moderate aortic valve stenosis. FINDINGS  Left Ventricle: Left  ventricular ejection fraction, by estimation, is 60 to 65%. The left ventricle has normal function. The left ventricle has no regional wall motion abnormalities. The left ventricular internal cavity size was normal in size. There is  mild left ventricular hypertrophy. Left ventricular diastolic parameters are consistent with Grade I diastolic dysfunction (impaired relaxation). Right Ventricle: The right ventricular size is moderately enlarged. Right ventricular systolic function is normal. Left Atrium: Left atrial size was severely dilated. Right Atrium: Right atrial size was mildly dilated. Pericardium: There is no evidence of pericardial effusion. Mitral Valve: The mitral valve is normal in structure. Normal mobility of the mitral valve leaflets. Mild mitral annular calcification. Mild mitral valve regurgitation. No evidence of mitral valve stenosis. Tricuspid Valve: The tricuspid valve is normal in structure. Tricuspid valve regurgitation is mild . No evidence of tricuspid stenosis. Aortic Valve: The aortic valve has an indeterminant number of cusps. Aortic valve regurgitation is mild. Aortic regurgitation PHT measures 398 msec. Mild to moderate aortic stenosis is present. Aortic valve mean gradient measures 18.0 mmHg. Aortic valve peak gradient measures 17.3 mmHg. Aortic valve area, by VTI measures 1.48 cm. Pulmonic Valve: The pulmonic valve was not well visualized. Pulmonic valve regurgitation is trivial. No evidence of pulmonic stenosis. Aorta: The aortic root is normal in size and structure. Venous: The inferior vena cava was not well visualized.  LEFT VENTRICLE PLAX 2D LVIDd:         5.16 cm  Diastology LVIDs:         3.68 cm  LV e' lateral:   8.38 cm/s LV PW:         1.20 cm  LV E/e' lateral: 10.0 LV IVS:        1.21 cm  LV e' medial:    7.07 cm/s LVOT diam:     1.60 cm  LV E/e' medial:  11.8 LV SV:         63 LV SV Index:   39 LVOT Area:     2.01 cm  LEFT ATRIUM             Index  RIGHT ATRIUM            Index LA diam:        4.80 cm 2.92 cm/m  RA Area:     18.20 cm LA Vol (A2C):   94.5 ml 57.58 ml/m RA Volume:   53.40 ml  32.54 ml/m LA Vol (A4C):   65.0 ml 39.61 ml/m LA Biplane Vol: 86.6 ml 52.77 ml/m  AORTIC VALVE AV Area (Vmax):    1.47 cm AV Area (Vmean):   1.37 cm AV Area (VTI):     1.48 cm AV Vmax:           208.00 cm/s AV Vmean:          156.000 cm/s AV VTI:            0.429 m AV Peak Grad:      17.3 mmHg AV Mean Grad:      18.0 mmHg LVOT Vmax:         152.00 cm/s LVOT Vmean:        106.000 cm/s LVOT VTI:          0.315 m LVOT/AV VTI ratio: 0.73 AI PHT:            398 msec  AORTA Ao Root diam: 3.60 cm MITRAL VALVE                TRICUSPID VALVE MV Area (PHT): 2.47 cm     TR Peak grad:   33.4 mmHg MV Decel Time: 307 msec     TR Vmax:        289.00 cm/s MR Peak grad: 132.4 mmHg MR Mean grad: 89.3 mmHg     SHUNTS MR Vmax:      575.33 cm/s   Systemic VTI:  0.32 m MR Vmean:     446.3 cm/s    Systemic Diam: 1.60 cm MV E velocity: 83.70 cm/s MV A velocity: 103.00 cm/s MV E/A ratio:  0.81 Kirk Ruths MD Electronically signed by Kirk Ruths MD Signature Date/Time: 08/06/2019/1:17:00 PM    Final    CT Angio Abd/Pel W and/or Wo Contrast  Addendum Date: 08/09/2019   ADDENDUM REPORT: 08/09/2019 19:58 ADDENDUM: Additional impression point: Gallbladder distention is a nonspecific finding in the absence of inflammatory change. Chronic cholecystitis/gallbladder dysfunction as well as occasionally acute cholecystitis can present with this appearance. Recommend correlation with right upper quadrant symptoms and labs. Could consider further evaluation with right upper quadrant ultrasound. Addendum by telephone at the time of submission on 08/09/2019 at 7:57 pm to provider [facility] , who verbally acknowledged these results. Electronically Signed   By: Lovena Le M.D.   On: 08/09/2019 19:58   Result Date: 08/09/2019 CLINICAL DATA:  GI bleed, recent discharge for same EXAM: CTA ABDOMEN AND PELVIS WITHOUT  AND WITH CONTRAST TECHNIQUE: Multidetector CT imaging of the abdomen and pelvis was performed using the standard protocol during bolus administration of intravenous contrast. Multiplanar reconstructed images and MIPs were obtained and reviewed to evaluate the vascular anatomy. CONTRAST:  79mL OMNIPAQUE IOHEXOL 350 MG/ML SOLN COMPARISON:  Renal ultrasound 08/04/2019, CT angio chest 11/22/2018, abdominal ultrasound 08/19/2011 FINDINGS: VASCULAR Aorta: Calcified noncalcified atheromatous plaque present throughout the native abdominal aorta. No acute luminal abnormality, periaortic stranding or hemorrhage is seen. No aneurysm or ectasia. Celiac: Ostial plaque at the celiac axis origin results in some mild ostial narrowing which is exacerbated by slightly hook like configuration implicating some compression by the median arcuate ligament. There is post stenotic dilatation  up to 10 mm in size. Minimal plaque is present in the splenic artery branch as well. No other significant stenosis is seen however. No acute luminal abnormality such as dissection, aneurysm or features of vasculitis. SMA: Mild ostial plaque narrowing at the SMA origin with calcified and noncalcified plaque in the proximal SMA resulting in at most mild multifocal narrowings of the proximal vessel. Vessel otherwise normally opacified without acute luminal abnormality or features of vasculitis. Renals: Single renal arteries bilaterally. Ostial plaque results in mild narrowing on the right and moderate narrowing on the left. No evidence of aneurysm, dissection, vasculitis or features of fibromuscular dysplasia. IMA: Ostial plaque resulting in severe narrowing of the IMA artery origin. Vessel is otherwise opacified normally without significant stenosis. No acute luminal abnormality. No features of vasculitis. Inflow: Extensive calcific plaque throughout the common, internal external iliac arteries which does result in some mild segmental narrowing of the  proximal internal iliac arteries bilaterally. Proximal branches are otherwise normally opacified. Proximal Outflow: Plaque seen throughout the proximal outflow vessels including the bilateral common femoral and visualized superficial femoral and deep femoral arteries. Results in at least some mild narrowing at the right deep femoral origin. No other significant stenosis or acute luminal abnormality is seen. No aneurysm or ectasia. Veins: No acute abnormality of the major venous branches seen on venous or arterial phase imaging. Portal and hepatic veins are both filled well opacified and free of acute abnormality. Review of the MIP images confirms the above findings. NON-VASCULAR Lower chest: Small bilateral pleural effusions with some adjacent passive atelectasis which appears to enhance uniformly. Mild basilar bronchitic changes are present. Mild cardiomegaly with biatrial enlargement and three-vessel coronary artery disease as well as extensive calcification of the aortic leaflets and mitral valve annulus. Hepatobiliary: Punctate likely benign calcification in the caudate, could reflect sequela of prior granulomatous disease. No concerning liver lesions. Normal liver attenuation. Smooth surface contour. Marked distension of the gallbladder without visible calcified gallstone within the gallbladder lumen or biliary tree. No pericholecystic fluid or inflammation. No biliary ductal dilatation. Pancreas: Moderate pancreatic atrophy. No pancreatic inflammation, discernible lesion or ductal dilatation. Spleen: Normal arterial and venous enhancement patterns of the spleen. Splenic size is upper limits normal. No focal splenic lesion. Adrenals/Urinary Tract: Mild lobular thickening of the adrenal glands may reflect some senescent adrenal hyperplasia. Kidneys enhance symmetrically. Calcification in the interpolar right kidney, previously suspected to reflect a renal calculus more accurately reflects a peripherally  calcified, partially exophytic cyst (Bosniak IIF). No other focal concerning renal lesions. Mild circumferential thickening of the bladder wall with perivesicular hazy stranding and indentation of bladder base by an enlarged prostate. Stomach/Bowel: Small amount of posterior layering hyperdensity within a left upper quadrant small bowel loop likely reflect ingested material given the presence on precontrast examination. No abnormal sites of contrast blush or accumulation are seen to suggest a discernible site of active bleeding. Small sliding-type hiatal hernia. Distal stomach is unremarkable. Small duodenal lipoma (7/88) as well as two air-filled duodenal diverticula measuring up to 3.7 cm (7/80, 7/67) no small bowel thickening or dilatation. Appendix is not visualized. No focal inflammation the vicinity of the cecum to suggest an occult appendicitis. No colonic dilatation or wall thickening. Portion of the sigmoid colon protrudes into an indirect left inguinal hernia without convincing evidence of vascular compromise of the bowel wall or resulting mechanical obstruction. Into a scattered colonic diverticula without focal inflammation to suggest diverticulitis. 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 No pathologically enlarged lymph nodes. Reproductive: Mild prostatomegaly. No focal concerning abnormality of the prostate or seminal vesicles. Other: Diffuse circumferential body wall edema most pronounced posteriorly. Fat and bowel containing left inguinal hernia. Small fat containing right inguinal hernia as well. No abdominopelvic free air or fluid. Musculoskeletal: Multilevel degenerative changes are present in the imaged portions of the spine. 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 Additional degenerative changes of the hips and pelvis including partial ankylosis of the SI joints. IMPRESSION: 1. No abnormal sites of contrast blush or accumulation are seen to suggest a discernible site of active bleeding. If there is persisting concern for slow bleed or intermittent bleeding, nuclear medicine bleeding scan could be performed. 2. Duodenal and colonic diverticulosis without evidence of active diverticular inflammation, can present with GI bleeding in the absence of inflammation. 3. Aortic Atherosclerosis (ICD10-I70.0). Multilevel splanchnic and renal artery stenoses secondary to atheromatous disease, as detailed level by level above. 4. Hook-like configuration of the celiac axis could reflect some compression by the median arcuate ligament. Mild poststenotic dilatation. 5. Portion of the sigmoid colon protrudes into an indirect left inguinal hernia without convincing evidence of vascular compromise of the bowel wall or resulting mechanical obstruction. 6. 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 7. Mild circumferential thickening of the bladder wall with perivesicular hazy stranding and indentation of bladder base by an enlarged prostate. Findings are suggestive of cystitis and/or chronic outlet obstruction. Correlate with urinalysis. 8. Cardiomegaly with biatrial enlargement and three-vessel coronary artery disease. 9. Features suggestive of anasarca with body wall edema and bilateral pleural effusions. 10. Thick peripheral calcifications of the partially exophytic interpolar right renal cyst measuring up to 10 mm. Recommend renal protocol MRI at 6 and 12 months then yearly for 5 years. This recommendation follows ACR consensus guidelines: Management of the Incidental Renal Mass on CT: A White Paper of the ACR Incidental Findings Committee. J Am Coll Radiol [phone]. Electronically Signed: By: Lovena Le M.D. On: 08/09/2019 19:17   US Abdomen Limited RUQ  Result Date: 08/09/2019 CLINICAL DATA:  Gallbladder disease, abnormal CT EXAM: ULTRASOUND ABDOMEN LIMITED RIGHT UPPER QUADRANT COMPARISON:  CT 08/09/2019 FINDINGS: Gallbladder: Gallbladder appears diffusely distended with heterogeneous mixture of layering biliary sludge. No visible shadowing gallstones are present. No significant gallbladder wall thickening. Sonographic Percell Miller sign is reportedly negative. Common bile duct: Diameter: 4.8 mm, nondilated Liver: Suboptimal visualization of the left lobe liver due to patient body habitus. Diffusely coarsened hepatic echotexture without visible focal lesion. Portal vein is patent on color Doppler imaging with normal direction of blood flow towards the liver. Other: None. IMPRESSION: Mildly coarsened liver echotexture is nonspecific. Could correlate with LFTs. Gallbladder distention with extensive biliary sludge. No visible calcified gallstones. No additional supporting features of acute cholecystitis however distention is a nonspecific finding that can reflect gallbladder dysfunction/chronic cholecystitis. Could consider outpatient evaluation with HIDA scan with gallbladder ejection fraction. Electronically Signed   By: Lovena Le M.D.   On: 08/09/2019 21:11    Microbiology: Recent Results (from the past 240 hour(s))  SARS Coronavirus 2 by RT PCR (hospital order, performed in [facility] hospital lab) Nasopharyngeal Nasopharyngeal Swab     Status: None   Collection Time: 08/09/19  5:27 PM   Specimen: Nasopharyngeal Swab  Result Value Ref Range Status   SARS Coronavirus  2 NEGATIVE NEGATIVE Final    Comment: (NOTE) SARS-CoV-2 target nucleic acids are NOT DETECTED.  The SARS-CoV-2 RNA is generally detectable in upper and lower respiratory specimens during the acute phase of infection. The lowest concentration of SARS-CoV-2 viral copies this assay can detect is 250 copies / mL. A negative result  does not preclude SARS-CoV-2 infection and should not be used as the sole basis for treatment or other patient management decisions.  A negative result may occur with improper specimen collection / handling, submission of specimen other than nasopharyngeal swab, presence of viral mutation(s) within the areas targeted by this assay, and inadequate number of viral copies (<250 copies / mL). A negative result must be combined with clinical observations, patient history, and epidemiological information.  Fact Sheet for Patients:   StrictlyIdeas.no  Fact Sheet for Healthcare Providers: BankingDealers.co.za  This test is not yet approved or  cleared by the [place] FDA and has been authorized for detection and/or diagnosis of SARS-CoV-2 by FDA under an Emergency Use Authorization (EUA).  This EUA will remain in effect (meaning this test can be used) for the duration of the COVID-19 declaration under Section 564(b)(1) of the Act, 21 U.S.C. section 360bbb-3(b)(1), unless the authorization is terminated or revoked sooner.  Performed at [facility], [address] [place], [place] [address]   Blood Culture (routine x 2)     Status: None   Collection Time: 08/09/19  5:28 PM   Specimen: BLOOD  Result Value Ref Range Status   Specimen Description BLOOD BLOOD LEFT HAND  Final   Special Requests   Final    BOTTLES DRAWN AEROBIC AND ANAEROBIC Blood Culture results may not be optimal due to an inadequate volume of blood received in culture bottles   Culture   Final    NO GROWTH 5 DAYS Performed at [facility], [address], [place], [place] [address]    Report Status 08/14/2019 FINAL  Final  Blood Culture (routine x 2)     Status: Abnormal   Collection Time: 08/09/19  5:28 PM   Specimen: BLOOD  Result Value Ref Range Status   Specimen Description   Final    BLOOD LEFT ANTECUBITAL Performed at [facility],  [address] [place], [place] [address]    Special Requests   Final    BOTTLES DRAWN AEROBIC AND ANAEROBIC Blood Culture results may not be optimal due to an inadequate volume of blood received in culture bottles Performed at [facility], [address] [place], [place] [address]    Culture  Setup Time   Final    Organism ID to follow [place] TO, READ BACK BY AND VERIFIED WITH: [place] @1729  08/10/19 MJU Performed at [facility], [address] [place], [place] [address]    Culture (A)  Final    STAPHYLOCOCCUS EPIDERMIDIS THE SIGNIFICANCE OF ISOLATING THIS ORGANISM FROM A SINGLE SET OF BLOOD CULTURES WHEN MULTIPLE SETS ARE DRAWN IS UNCERTAIN. PLEASE NOTIFY THE MICROBIOLOGY DEPARTMENT WITHIN ONE WEEK IF SPECIATION AND SENSITIVITIES ARE REQUIRED. Performed at [facility], [place] [address] [place], [place] [address]    Report Status 08/11/2019 FINAL  Final  Blood Culture ID Panel (Reflexed)     Status: Abnormal   Collection Time: 08/09/19  5:28 PM  Result Value Ref Range Status   Enterococcus faecalis NOT DETECTED NOT DETECTED Final   Enterococcus Faecium NOT DETECTED NOT DETECTED  Final   Listeria monocytogenes NOT DETECTED NOT DETECTED Final   Staphylococcus species DETECTED (A) NOT DETECTED Final    Comment: CRITICAL RESULT CALLED TO, READ BACK BY AND VERIFIED WITH: ALEX CHAPPELL @1729  08/10/19 MJU    Staphylococcus aureus (BCID) NOT DETECTED NOT DETECTED Final   Staphylococcus epidermidis DETECTED (A) NOT DETECTED Final    Comment: CRITICAL RESULT CALLED TO, READ BACK BY AND VERIFIED WITH: [place] @1729  08/10/19 MJU    Staphylococcus lugdunensis NOT DETECTED NOT DETECTED Final   Streptococcus species NOT DETECTED NOT DETECTED Final   Streptococcus agalactiae NOT DETECTED NOT DETECTED Final   Streptococcus pneumoniae NOT DETECTED NOT DETECTED Final   Streptococcus pyogenes NOT DETECTED  NOT DETECTED Final   A.calcoaceticus-baumannii NOT DETECTED NOT DETECTED Final   Bacteroides fragilis NOT DETECTED NOT DETECTED Final   Enterobacterales NOT DETECTED NOT DETECTED Final   Enterobacter cloacae complex NOT DETECTED NOT DETECTED Final   Escherichia coli NOT DETECTED NOT DETECTED Final   Klebsiella aerogenes NOT DETECTED NOT DETECTED Final   Klebsiella oxytoca NOT DETECTED NOT DETECTED Final   Klebsiella pneumoniae NOT DETECTED NOT DETECTED Final   Proteus species NOT DETECTED NOT DETECTED Final   Salmonella species NOT DETECTED NOT DETECTED Final   Serratia marcescens NOT DETECTED NOT DETECTED Final   Haemophilus influenzae NOT DETECTED NOT DETECTED Final   Neisseria meningitidis NOT DETECTED NOT DETECTED Final   Pseudomonas aeruginosa NOT DETECTED NOT DETECTED Final   Stenotrophomonas maltophilia NOT DETECTED NOT DETECTED Final   Candida albicans NOT DETECTED NOT DETECTED Final   Candida auris NOT DETECTED NOT DETECTED Final   Candida glabrata NOT DETECTED NOT DETECTED Final   Candida krusei NOT DETECTED NOT DETECTED Final   Candida parapsilosis NOT DETECTED NOT DETECTED Final   Candida tropicalis NOT DETECTED NOT DETECTED Final   Cryptococcus neoformans/gattii NOT DETECTED NOT DETECTED Final   Methicillin resistance mecA/C NOT DETECTED NOT DETECTED Final    Comment: Performed at [facility], [address] [place], [place] [address]  MRSA PCR Screening     Status: None   Collection Time: 08/09/19  8:57 PM   Specimen: Nasal Mucosa; Nasopharyngeal  Result Value Ref Range Status   MRSA by PCR NEGATIVE NEGATIVE Final    Comment:        The GeneXpert MRSA Assay (FDA approved for NASAL specimens only), is one component of a comprehensive MRSA colonization surveillance program. It is not intended to diagnose MRSA infection nor to guide or monitor treatment for MRSA infections. Performed at [facility], [address], [place], [place] [address]    Urine culture     Status: None   Collection Time: 08/10/19  5:00 AM   Specimen: In/Out Cath Urine  Result Value Ref Range Status   Specimen Description   Final    IN/OUT CATH URINE Performed at [facility], [address] [place], [place] [address]    Special Requests   Final    NONE Performed at [facility], [address], [place], [place] [address]    Culture   Final    NO GROWTH Performed at [facility], [place] [address] [place], [place] [address]    Report Status 08/11/2019 FINAL  Final  Gastrointestinal Panel by PCR , Stool     Status: None   Collection Time: 08/12/19  4:31 PM   Specimen: Stool  Result Value Ref Range Status   Campylobacter species NOT DETECTED NOT DETECTED Final  Plesimonas shigelloides NOT DETECTED NOT DETECTED Final   Salmonella species NOT DETECTED NOT DETECTED Final   Yersinia enterocolitica NOT DETECTED NOT DETECTED Final   Vibrio species NOT DETECTED NOT DETECTED Final   Vibrio cholerae NOT DETECTED NOT DETECTED Final   Enteroaggregative E coli (EAEC) NOT DETECTED NOT DETECTED Final   Enteropathogenic E coli (EPEC) NOT DETECTED NOT DETECTED Final   Enterotoxigenic E coli (ETEC) NOT DETECTED NOT DETECTED Final   Shiga like toxin producing E coli (STEC) NOT DETECTED NOT DETECTED Final   Shigella/Enteroinvasive E coli (EIEC) NOT DETECTED NOT DETECTED Final   Cryptosporidium NOT DETECTED NOT DETECTED Final   Cyclospora cayetanensis NOT DETECTED NOT DETECTED Final   Entamoeba histolytica NOT DETECTED NOT DETECTED Final   Giardia lamblia NOT DETECTED NOT DETECTED Final   Adenovirus F40/41 NOT DETECTED NOT DETECTED Final   Astrovirus NOT DETECTED NOT DETECTED Final   Norovirus GI/GII NOT DETECTED NOT DETECTED Final   Rotavirus A NOT DETECTED NOT DETECTED Final   Sapovirus (I, II, IV, and V) NOT DETECTED NOT DETECTED Final    Comment: Performed at [facility], [address] [place], [place] [address]      Labs: Basic Metabolic Panel: Recent Labs  Lab 08/09/19 1727 08/10/19 0500 08/11/19 0422 08/12/19 0901 08/14/19 0451  NA 140 139 140 142 140  K 4.8 4.1 3.8 3.6 3.7  CL 107 110 113* 113* 118*  CO2 21* 20* 20* 21* 19*  GLUCOSE 96 108* 120* 95 112*  BUN 44* 41* 35* 30* 23  CREATININE 1.54* 1.28* 1.39* 1.30* 1.26*  CALCIUM 7.8* 7.3* 7.0* 7.0* 7.0*  MG 1.5*  --   --   --   --    Liver Function Tests: Recent Labs  Lab 08/09/19 1624 08/09/19 1727 08/10/19 0500 08/11/19 0422 08/12/19 0901  AST 47* 47* 30 27 26   ALT 20 18 16 16 15   ALKPHOS 90 85 68 95 101  BILITOT 1.3* 1.3* 1.4* 1.1 1.1  PROT 5.6* 5.4* 4.3* 4.3* 4.7*  ALBUMIN 2.6* 2.6* 1.9* 1.9* 2.0*   No results for input(s): LIPASE, AMYLASE in the last 168 hours. No results for input(s): AMMONIA in the last 168 hours. CBC: Recent Labs  Lab 08/09/19 1727 08/09/19 1727 08/09/19 2057 08/10/19 0018 08/10/19 0500 08/10/19 1130 08/10/19 1744 08/10/19 2348 08/11/19 0422 08/12/19 0901 08/14/19 0451  WBC 25.5*   < > 19.5*  --  17.1*  --   --   --  11.3* 9.3 9.4  NEUTROABS 21.8*  --  17.0*  --   --   --   --   --   --   --   --   HGB 8.9*   < > 7.5*   < > 8.2*   < > 8.1* 8.3* 8.3* 8.9* 8.1*  HCT 28.0*   < > 23.4*   < > 24.4*   < > 25.1* 25.0* 24.7* 27.4* 25.6*  MCV 93.3   < > 93.2  --  88.7  --   --   --  89.5 90.4 91.8  PLT 208   < > 145*  --  151  --   --   --  159 165 165   < > = values in this interval not displayed.   Cardiac Enzymes: No results for input(s): CKTOTAL, CKMB, CKMBINDEX, TROPONINI in the last 168 hours. BNP: BNP (last 3 results) No results for input(s): BNP in the last 8760 hours.  ProBNP (last 3 results)  No results for input(s): PROBNP in the last 8760 hours.  CBG: No results for input(s): GLUCAP in the last 168 hours.     Signed:  Alma Friendly, MD [facility] 08/14/2019, 12:59 PM

## 2019-08-14 NOTE — Care Management Important Message (Signed)
Important Message  Patient Details  Name: Albert [name] MRN: [id number] Date of Birth: 1933/10/07   Medicare Important Message Given:  Yes     Loann Quill 08/14/2019, 1:11 PM

## 2019-08-24 ENCOUNTER — Inpatient Hospital Stay
Admission: EM | Admit: 2019-08-24 | Discharge: 2019-09-12 | DRG: 853 | Disposition: A | Discharge status: Skilled Nursing Facility | Payer: Medicare Other | Attending: Family Medicine | Admitting: Family Medicine

## 2019-08-24 ENCOUNTER — Other Ambulatory Visit: Payer: Self-pay

## 2019-08-24 ENCOUNTER — Emergency Department: Payer: Medicare Other

## 2019-08-24 DIAGNOSIS — Z7189 Other specified counseling: Secondary | ICD-10-CM

## 2019-08-24 DIAGNOSIS — G9520 Unspecified cord compression: Secondary | ICD-10-CM | POA: Diagnosis present

## 2019-08-24 DIAGNOSIS — A0472 Enterocolitis due to Clostridium difficile, not specified as recurrent: Secondary | ICD-10-CM

## 2019-08-24 DIAGNOSIS — M8580 Other specified disorders of bone density and structure, unspecified site: Secondary | ICD-10-CM | POA: Diagnosis present

## 2019-08-24 DIAGNOSIS — Z7401 Bed confinement status: Secondary | ICD-10-CM

## 2019-08-24 DIAGNOSIS — Z6831 Body mass index (BMI) 31.0-31.9, adult: Secondary | ICD-10-CM

## 2019-08-24 DIAGNOSIS — N1831 Chronic kidney disease, stage 3a: Secondary | ICD-10-CM | POA: Diagnosis not present

## 2019-08-24 DIAGNOSIS — E875 Hyperkalemia: Secondary | ICD-10-CM | POA: Diagnosis not present

## 2019-08-24 DIAGNOSIS — Z9889 Other specified postprocedural states: Secondary | ICD-10-CM

## 2019-08-24 DIAGNOSIS — R471 Dysarthria and anarthria: Secondary | ICD-10-CM | POA: Diagnosis present

## 2019-08-24 DIAGNOSIS — A419 Sepsis, unspecified organism: Principal | ICD-10-CM

## 2019-08-24 DIAGNOSIS — I472 Ventricular tachycardia: Secondary | ICD-10-CM | POA: Diagnosis not present

## 2019-08-24 DIAGNOSIS — Z7982 Long term (current) use of aspirin: Secondary | ICD-10-CM

## 2019-08-24 DIAGNOSIS — E0590 Thyrotoxicosis, unspecified without thyrotoxic crisis or storm: Secondary | ICD-10-CM | POA: Diagnosis not present

## 2019-08-24 DIAGNOSIS — M359 Systemic involvement of connective tissue, unspecified: Secondary | ICD-10-CM

## 2019-08-24 DIAGNOSIS — M25512 Pain in left shoulder: Secondary | ICD-10-CM | POA: Diagnosis present

## 2019-08-24 DIAGNOSIS — I493 Ventricular premature depolarization: Secondary | ICD-10-CM | POA: Diagnosis present

## 2019-08-24 DIAGNOSIS — J189 Pneumonia, unspecified organism: Secondary | ICD-10-CM | POA: Diagnosis not present

## 2019-08-24 DIAGNOSIS — Z20822 Contact with and (suspected) exposure to covid-19: Secondary | ICD-10-CM | POA: Diagnosis present

## 2019-08-24 DIAGNOSIS — D500 Iron deficiency anemia secondary to blood loss (chronic): Secondary | ICD-10-CM | POA: Diagnosis present

## 2019-08-24 DIAGNOSIS — H9190 Unspecified hearing loss, unspecified ear: Secondary | ICD-10-CM | POA: Diagnosis present

## 2019-08-24 DIAGNOSIS — E785 Hyperlipidemia, unspecified: Secondary | ICD-10-CM | POA: Diagnosis present

## 2019-08-24 DIAGNOSIS — D649 Anemia, unspecified: Secondary | ICD-10-CM

## 2019-08-24 DIAGNOSIS — R1314 Dysphagia, pharyngoesophageal phase: Secondary | ICD-10-CM | POA: Diagnosis present

## 2019-08-24 DIAGNOSIS — Z515 Encounter for palliative care: Secondary | ICD-10-CM | POA: Diagnosis not present

## 2019-08-24 DIAGNOSIS — J90 Pleural effusion, not elsewhere classified: Secondary | ICD-10-CM | POA: Diagnosis present

## 2019-08-24 DIAGNOSIS — I130 Hypertensive heart and chronic kidney disease with heart failure and stage 1 through stage 4 chronic kidney disease, or unspecified chronic kidney disease: Secondary | ICD-10-CM | POA: Diagnosis present

## 2019-08-24 DIAGNOSIS — Z79899 Other long term (current) drug therapy: Secondary | ICD-10-CM

## 2019-08-24 DIAGNOSIS — E872 Acidosis, unspecified: Secondary | ICD-10-CM

## 2019-08-24 DIAGNOSIS — E43 Unspecified severe protein-calorie malnutrition: Secondary | ICD-10-CM | POA: Diagnosis present

## 2019-08-24 DIAGNOSIS — N1832 Chronic kidney disease, stage 3b: Secondary | ICD-10-CM | POA: Diagnosis present

## 2019-08-24 DIAGNOSIS — I959 Hypotension, unspecified: Secondary | ICD-10-CM | POA: Diagnosis present

## 2019-08-24 DIAGNOSIS — E876 Hypokalemia: Secondary | ICD-10-CM | POA: Diagnosis not present

## 2019-08-24 DIAGNOSIS — I350 Nonrheumatic aortic (valve) stenosis: Secondary | ICD-10-CM | POA: Diagnosis present

## 2019-08-24 DIAGNOSIS — D631 Anemia in chronic kidney disease: Secondary | ICD-10-CM | POA: Diagnosis present

## 2019-08-24 DIAGNOSIS — E870 Hyperosmolality and hypernatremia: Secondary | ICD-10-CM | POA: Diagnosis not present

## 2019-08-24 DIAGNOSIS — Y95 Nosocomial condition: Secondary | ICD-10-CM | POA: Diagnosis present

## 2019-08-24 DIAGNOSIS — R778 Other specified abnormalities of plasma proteins: Secondary | ICD-10-CM | POA: Diagnosis present

## 2019-08-24 DIAGNOSIS — R54 Age-related physical debility: Secondary | ICD-10-CM | POA: Diagnosis present

## 2019-08-24 DIAGNOSIS — I5030 Unspecified diastolic (congestive) heart failure: Secondary | ICD-10-CM | POA: Diagnosis present

## 2019-08-24 DIAGNOSIS — I10 Essential (primary) hypertension: Secondary | ICD-10-CM | POA: Diagnosis not present

## 2019-08-24 DIAGNOSIS — I82409 Acute embolism and thrombosis of unspecified deep veins of unspecified lower extremity: Secondary | ICD-10-CM

## 2019-08-24 DIAGNOSIS — Z87440 Personal history of urinary (tract) infections: Secondary | ICD-10-CM

## 2019-08-24 DIAGNOSIS — N183 Chronic kidney disease, stage 3 unspecified: Secondary | ICD-10-CM | POA: Diagnosis present

## 2019-08-24 DIAGNOSIS — R7989 Other specified abnormal findings of blood chemistry: Secondary | ICD-10-CM

## 2019-08-24 DIAGNOSIS — J690 Pneumonitis due to inhalation of food and vomit: Secondary | ICD-10-CM | POA: Diagnosis present

## 2019-08-24 DIAGNOSIS — Z880 Allergy status to penicillin: Secondary | ICD-10-CM

## 2019-08-24 DIAGNOSIS — R300 Dysuria: Secondary | ICD-10-CM | POA: Diagnosis present

## 2019-08-24 DIAGNOSIS — Z85828 Personal history of other malignant neoplasm of skin: Secondary | ICD-10-CM

## 2019-08-24 DIAGNOSIS — M25511 Pain in right shoulder: Secondary | ICD-10-CM | POA: Diagnosis present

## 2019-08-24 DIAGNOSIS — R1310 Dysphagia, unspecified: Secondary | ICD-10-CM

## 2019-08-24 DIAGNOSIS — G8929 Other chronic pain: Secondary | ICD-10-CM | POA: Diagnosis present

## 2019-08-24 DIAGNOSIS — K2210 Ulcer of esophagus without bleeding: Secondary | ICD-10-CM | POA: Diagnosis present

## 2019-08-24 DIAGNOSIS — R1312 Dysphagia, oropharyngeal phase: Secondary | ICD-10-CM

## 2019-08-24 DIAGNOSIS — K2100 Gastro-esophageal reflux disease with esophagitis, without bleeding: Secondary | ICD-10-CM | POA: Diagnosis present

## 2019-08-24 DIAGNOSIS — I480 Paroxysmal atrial fibrillation: Secondary | ICD-10-CM | POA: Diagnosis present

## 2019-08-24 DIAGNOSIS — Z8719 Personal history of other diseases of the digestive system: Secondary | ICD-10-CM

## 2019-08-24 LAB — URINALYSIS, COMPLETE (UACMP) WITH MICROSCOPIC
Bacteria, UA: NONE SEEN
Bilirubin Urine: NEGATIVE
Glucose, UA: 50 mg/dL — AB
Hgb urine dipstick: NEGATIVE
Ketones, ur: 5 mg/dL — AB
Leukocytes,Ua: NEGATIVE
Nitrite: NEGATIVE
Protein, ur: NEGATIVE mg/dL
Specific Gravity, Urine: 1.015 (ref 1.005–1.030)
WBC, UA: NONE SEEN WBC/hpf (ref 0–5)
pH: 5 (ref 5.0–8.0)

## 2019-08-24 LAB — COMPREHENSIVE METABOLIC PANEL
ALT: 15 U/L (ref 0–44)
AST: 35 U/L (ref 15–41)
Albumin: 2.2 g/dL — ABNORMAL LOW (ref 3.5–5.0)
Alkaline Phosphatase: 65 U/L (ref 38–126)
Anion gap: 10 (ref 5–15)
BUN: 24 mg/dL — ABNORMAL HIGH (ref 8–23)
CO2: 21 mmol/L — ABNORMAL LOW (ref 22–32)
Calcium: 7.5 mg/dL — ABNORMAL LOW (ref 8.9–10.3)
Chloride: 109 mmol/L (ref 98–111)
Creatinine, Ser: 1.21 mg/dL (ref 0.61–1.24)
GFR calc Af Amer: >60 mL/min (ref >60)
GFR calc non Af Amer: 55 mL/min — ABNORMAL LOW (ref >60)
Glucose, Bld: 111 mg/dL — ABNORMAL HIGH (ref 70–99)
Potassium: 4.5 mmol/L (ref 3.5–5.1)
Sodium: 140 mmol/L (ref 135–145)
Total Bilirubin: 1 mg/dL (ref 0.3–1.2)
Total Protein: 5.7 g/dL — ABNORMAL LOW (ref 6.5–8.1)

## 2019-08-24 LAB — CBC WITH DIFFERENTIAL/PLATELET
Abs Immature Granulocytes: 0.19 10*3/uL — ABNORMAL HIGH (ref 0.00–0.07)
Basophils Absolute: 0.1 10*3/uL (ref 0.0–0.1)
Basophils Relative: 0 %
Eosinophils Absolute: 0.1 10*3/uL (ref 0.0–0.5)
Eosinophils Relative: 1 %
HCT: 29.3 % — ABNORMAL LOW (ref 39.0–52.0)
Hemoglobin: 9.1 g/dL — ABNORMAL LOW (ref 13.0–17.0)
Immature Granulocytes: 1 %
Lymphocytes Relative: 5 %
Lymphs Abs: 0.9 10*3/uL (ref 0.7–4.0)
MCH: 28.8 pg (ref 26.0–34.0)
MCHC: 31.1 g/dL (ref 30.0–36.0)
MCV: 92.7 fL (ref 80.0–100.0)
Monocytes Absolute: 1 10*3/uL (ref 0.1–1.0)
Monocytes Relative: 6 %
Neutro Abs: 15 10*3/uL — ABNORMAL HIGH (ref 1.7–7.7)
Neutrophils Relative %: 87 %
Platelets: 246 10*3/uL (ref 150–400)
RBC: 3.16 MIL/uL — ABNORMAL LOW (ref 4.22–5.81)
RDW: 17.3 % — ABNORMAL HIGH (ref 11.5–15.5)
WBC: 17.3 10*3/uL — ABNORMAL HIGH (ref 4.0–10.5)
nRBC: 0 % (ref 0.0–0.2)

## 2019-08-24 LAB — TROPONIN I (HIGH SENSITIVITY)
Troponin I (High Sensitivity): 97 ng/L — ABNORMAL HIGH (ref <18)
Troponin I (High Sensitivity): 93 ng/L — ABNORMAL HIGH (ref <18)

## 2019-08-24 LAB — PROTIME-INR
INR: 1.3 — ABNORMAL HIGH (ref 0.8–1.2)
Prothrombin Time: 16 seconds — ABNORMAL HIGH (ref 11.4–15.2)

## 2019-08-24 LAB — LACTIC ACID, PLASMA
Lactic Acid, Venous: 1.6 mmol/L (ref 0.5–1.9)
Lactic Acid, Venous: 3.7 mmol/L (ref 0.5–1.9)

## 2019-08-24 LAB — BRAIN NATRIURETIC PEPTIDE: B Natriuretic Peptide: 728.7 pg/mL — ABNORMAL HIGH (ref 0.0–100.0)

## 2019-08-24 LAB — SARS CORONAVIRUS 2 BY RT PCR (HOSPITAL ORDER, PERFORMED IN ~~LOC~~ HOSPITAL LAB): SARS Coronavirus 2: NEGATIVE

## 2019-08-24 MED ORDER — LACTATED RINGERS IV BOLUS (SEPSIS)
250.0000 mL | Freq: Once | INTRAVENOUS | Status: AC
  Administered 2019-08-24: 250 mL via INTRAVENOUS

## 2019-08-24 MED ORDER — LACTATED RINGERS IV BOLUS (SEPSIS)
1000.0000 mL | Freq: Once | INTRAVENOUS | Status: AC
  Administered 2019-08-24: 1000 mL via INTRAVENOUS

## 2019-08-24 MED ORDER — ACETAMINOPHEN 500 MG PO TABS: 1000.0000 mg | ORAL_TABLET | Freq: Once | ORAL | Status: DC

## 2019-08-24 MED ORDER — LACTATED RINGERS IV SOLN: INTRAVENOUS | Status: DC

## 2019-08-24 MED ORDER — SODIUM CHLORIDE 0.9 % IV SOLN
2.0000 g | Freq: Once | INTRAVENOUS | Status: AC
  Administered 2019-08-24: 2 g via INTRAVENOUS
  Filled 2019-08-24: qty 2

## 2019-08-24 MED ORDER — ACETAMINOPHEN 325 MG RE SUPP
325.0000 mg | Freq: Once | RECTAL | Status: AC
  Administered 2019-08-24: 325 mg via RECTAL
  Filled 2019-08-24: qty 1

## 2019-08-24 MED ORDER — VANCOMYCIN HCL IN DEXTROSE 1-5 GM/200ML-% IV SOLN
1000.0000 mg | Freq: Once | INTRAVENOUS | Status: AC
  Administered 2019-08-24: 1000 mg via INTRAVENOUS
  Filled 2019-08-24: qty 200

## 2019-08-24 NOTE — ED Notes (Signed)
Pt had large loose BM. Pt cleaned up and clean chux and brief placed. Pt repositioned and resting at this time

## 2019-08-24 NOTE — ED Provider Notes (Signed)
[facility] Emergency Department Provider Note ____________________________________________   First MD Initiated Contact with Patient 08/24/19 2035     (approximate)  I have reviewed the triage vital signs and the nursing notes.   HISTORY  Chief Complaint Fever and Sepsis    HPI Albert [name] is a 86 y.o. male with PMH as noted below presents from his facility with fever and low blood pressure today.  The patient states that his blood pressure has been going up and down.  He feels somewhat weak, has a cough, and bilateral shoulder pain but denies other acute symptoms.  He denies any specific difficulty breathing, vomiting or diarrhea, or other focal symptoms.  Past Medical History:  Diagnosis Date  . Actinic keratosis 04/23/2008   L elbow distal tricep (bx proven)  . Basal cell carcinoma 06/17/2006   L preauricular   . Basal cell carcinoma 05/26/2012   L ant neck   . Basal cell carcinoma 11/02/2012   Glabella - excision   . Basal cell carcinoma 08/15/2014   L med ankle   . Basal cell carcinoma 02/23/2017   R lat infrapectoral - excision  . Basal cell carcinoma 12/08/2017   L mid to distal lat volar forearm   . Hypertension   . Squamous cell carcinoma of skin 02/14/2014   R lat low back - ED&C  . Squamous cell carcinoma of skin 10/02/2014   R prox lat bicep - ED&C    Patient Active Problem List   Diagnosis Date Noted  . Protein-calorie malnutrition, severe 08/12/2019  . GIB (gastrointestinal bleeding) 08/10/2019  . GI bleed 08/09/2019  . Acute GI bleeding 08/04/2019  . Symptomatic anemia 08/04/2019  . Coagulopathy ([place]) 08/04/2019  . Metabolic acidosis 75/64/3329  . SIRS (systemic inflammatory response syndrome) ([place]) 08/04/2019  . UGI bleed 08/04/2019  . Dehydration 11/24/2018  . CKD (chronic kidney disease), stage III 11/24/2018  . Generalized weakness 11/22/2018  . Acute renal failure superimposed on stage 3a chronic kidney disease  ([place])   . Elevated troponin   . Benign essential hypertension 06/15/2013    Past Surgical History:  Procedure Laterality Date  . COLONOSCOPY N/A 08/05/2019   Procedure: COLONOSCOPY;  Surgeon: Toledo, Benay Pike, MD;  Location: [facility] ENDOSCOPY;  Service: Gastroenterology;  Laterality: N/A;  . ESOPHAGOGASTRODUODENOSCOPY N/A 08/05/2019   Procedure: ESOPHAGOGASTRODUODENOSCOPY (EGD);  Surgeon: Toledo, Benay Pike, MD;  Location: [facility] ENDOSCOPY;  Service: Gastroenterology;  Laterality: N/A;    Prior to Admission medications   Medication Sig Start Date End Date Taking? Authorizing Provider  aspirin EC 81 MG tablet Take 1 tablet (81 mg total) by mouth daily. Swallow whole.start 08/19/19 08/08/19 08/07/20  Georgette Shell, MD  atorvastatin (LIPITOR) 20 MG tablet Take 20 mg by mouth daily. 11/20/18   [provider]  Multiple Vitamin (MULTIVITAMIN WITH MINERALS) TABS tablet Take 1 tablet by mouth daily. 08/09/19   Georgette Shell, MD  ondansetron (ZOFRAN) 4 MG tablet Take 1 tablet (4 mg total) by mouth every 6 (six) hours as needed for nausea. 08/08/19   Georgette Shell, MD  pantoprazole (PROTONIX) 40 MG tablet Take 1 tablet (40 mg total) by mouth daily. 08/09/19   Georgette Shell, MD    Allergies Penicillins  No family history on file.  Social History Social History   Tobacco Use  . Smoking status: Never Smoker  . Smokeless tobacco: Never Used  Vaping Use  . Vaping Use: Never used  Substance Use Topics  .  Alcohol use: Not Currently  . Drug use: Never    Review of Systems  Constitutional: Positive for fever and weakness. Eyes: No redness. ENT: No sore throat. Cardiovascular: Denies chest pain. Respiratory: Denies shortness of breath. Gastrointestinal: No vomiting or diarrhea.  Genitourinary: Negative for dysuria.  Musculoskeletal: Negative for back pain.  Positive for shoulder pain. Skin: Negative for rash. Neurological: Negative for  headache   ____________________________________________   PHYSICAL EXAM:  VITAL SIGNS: ED Triage Vitals  Enc Vitals Group     BP 08/24/19 2045 (!) 76/50     Pulse Rate 08/24/19 2045 (!) 123     Resp 08/24/19 2056 (!) 22     Temp 08/24/19 2056 (!) 100.7 F (38.2 C)     Temp Source 08/24/19 2056 Rectal     SpO2 08/24/19 2045 97 %     Weight 08/24/19 2053 153 lb 14.1 oz (69.8 kg)     Height 08/24/19 2053 5\' 4"  (1.626 m)     Head Circumference --      Peak Flow --      Pain Score 08/24/19 2053 5     Pain Loc --      Pain Edu? --      Excl. in [place]? --     Constitutional: Alert and oriented.  Frail and weak appearing but in no acute distress. Eyes: Conjunctivae are normal.  EOMI. Head: Atraumatic. Nose: No congestion/rhinnorhea. Mouth/Throat: Mucous membranes are dry.   Neck: Normal range of motion.  Cardiovascular: Tachycardic, regular rhythm. Grossly normal heart sounds.  Good peripheral circulation. Respiratory: Normal respiratory effort.  No retractions. Lungs CTAB. Gastrointestinal: Soft and nontender. No distention.  Genitourinary: No flank tenderness. Musculoskeletal: No lower extremity edema.  Extremities warm and well perfused.  Neurologic:  Normal speech and language. No gross focal neurologic deficits are appreciated.  Skin:  Skin is warm and dry. No rash noted. Psychiatric: Mood and affect are normal. Speech and behavior are normal.  ____________________________________________   LABS (all labs ordered are listed, but only abnormal results are displayed)  Labs Reviewed  COMPREHENSIVE METABOLIC PANEL - Abnormal; Notable for the following components:      Result Value   CO2 21 (*)    Glucose, Bld 111 (*)    BUN 24 (*)    Calcium 7.5 (*)    Total Protein 5.7 (*)    Albumin 2.2 (*)    GFR calc non Af Amer 55 (*)    All other components within normal limits  CBC WITH DIFFERENTIAL/PLATELET - Abnormal; Notable for the following components:   WBC 17.3 (*)     RBC 3.16 (*)    Hemoglobin 9.1 (*)    HCT 29.3 (*)    RDW 17.3 (*)    Neutro Abs 15.0 (*)    Abs Immature Granulocytes 0.19 (*)    All other components within normal limits  PROTIME-INR - Abnormal; Notable for the following components:   Prothrombin Time 16.0 (*)    INR 1.3 (*)    All other components within normal limits  URINALYSIS, COMPLETE (UACMP) WITH MICROSCOPIC - Abnormal; Notable for the following components:   Color, Urine YELLOW (*)    APPearance HAZY (*)    Glucose, UA 50 (*)    Ketones, ur 5 (*)    All other components within normal limits  BRAIN NATRIURETIC PEPTIDE - Abnormal; Notable for the following components:   B Natriuretic Peptide 728.7 (*)    All other components within normal limits  TROPONIN I (HIGH SENSITIVITY) - Abnormal; Notable for the following components:   Troponin I (High Sensitivity) 97 (*)    All other components within normal limits  CULTURE, BLOOD (ROUTINE X 2)  CULTURE, BLOOD (ROUTINE X 2)  URINE CULTURE  SARS CORONAVIRUS 2 BY RT PCR (HOSPITAL ORDER, PERFORMED IN [facility])  LACTIC ACID, PLASMA  LACTIC ACID, PLASMA  TROPONIN I (HIGH SENSITIVITY)   ____________________________________________  EKG  ED ECG REPORT I, Arta Silence, the attending physician, personally viewed and interpreted this ECG.  Date: 08/24/2019 EKG Time: 2101 Rate: 121 Rhythm: Sinus tachycardia with PACs QRS Axis: normal Intervals: Nonspecific IVCD ST/T Wave abnormalities: Nonspecific ST abnormalities Narrative Interpretation: Nonspecific abnormalities with no evidence of acute ischemia  ____________________________________________  RADIOLOGY  CXR: Right basilar infiltrate  ____________________________________________   PROCEDURES  Procedure(s) performed: No  Procedures  Critical Care performed: Yes  CRITICAL CARE Performed by: Arta Silence   Total critical care time: 30 minutes  Critical care time was exclusive of  separately billable procedures and treating other patients.  Critical care was necessary to treat or prevent imminent or life-threatening deterioration.  Critical care was time spent personally by me on the following activities: development of treatment plan with patient and/or surrogate as well as nursing, discussions with consultants, evaluation of patient's response to treatment, examination of patient, obtaining history from patient or surrogate, ordering and performing treatments and interventions, ordering and review of laboratory studies, ordering and review of radiographic studies, pulse oximetry and re-evaluation of patient's condition. ____________________________________________   INITIAL IMPRESSION / ASSESSMENT AND PLAN / ED COURSE  Pertinent labs & imaging results that were available during my care of the patient were reviewed by me and considered in my medical decision making (see chart for details).  86 year old male with PMH as noted above presents with fever and hypotension noted at his facility today.  EMS gave Tylenol and fluids.  The patient reports some generalized weakness but denies most focal symptoms.  I reviewed the past medical records in epic.  The patient was recently admitted to the hospital and discharged 10 days ago for weakness and melena.  He was treated for cystitis with antibiotics and placed on octreotide and Protonix drips and transfused 1 unit PRBCs for the GI bleed.  Previously he had been admitted for upper GI bleed and diagnosed with esophageal and duodenal ulcers at the end of last month.  On exam today, the patient is weak and frail appearing but alert and oriented.  He is febrile, hypotensive, and tachycardic with a normal O2 saturation.  Lungs are clear bilaterally.  Mucous membranes are dry.  Neurologic exam is nonfocal.  Exam is otherwise as described above.  Overall presentation is most consistent with acute infection/sepsis, either recurrent  UTI/cystitis, versus possible pulmonary etiology including COVID-19 or bacterial pneumonia.  I have a lower suspicion for his abnormal vital signs being related to GI bleed given the lack of any hematemesis, recent melena, or other specific symptoms to suggest GI etiology.  I have ordered fluids and lab work-up per sepsis protocol.  We will start broad-spectrum antibiotics.  ----------------------------------------- 10:55 PM on 08/24/2019 -----------------------------------------  The patient's vital signs are improving with fluids.  His lactate is normal.  Antibiotics have been given.  I discussed the case with the hospitalist for admission.  ____________________________________________   FINAL CLINICAL IMPRESSION(S) / ED DIAGNOSES  Final diagnoses:  Healthcare-associated pneumonia  Sepsis, due to unspecified organism, unspecified whether acute organ dysfunction present ([place])  NEW MEDICATIONS STARTED DURING THIS VISIT:  New Prescriptions   No medications on file     Note:  This document was prepared using Dragon voice recognition software and may include unintentional dictation errors.    Arta Silence, MD 08/24/19 [phone]

## 2019-08-24 NOTE — ED Notes (Signed)
Pt had soaked brief. Pt cleaned up and clean brief placed on pt. Pt placed in clean gown and clean chux on bed. Pt pulled up in bed and repositioned. Pt resting at this time.

## 2019-08-24 NOTE — ED Notes (Signed)
Date and time results received: 08/24/19 2346 (use smartphrase ".now" to insert current time)  Test: Lactic Acid Critical Value: 3.7  Name of Provider Notified: Juleen China

## 2019-08-24 NOTE — Progress Notes (Signed)
PHARMACY -  BRIEF ANTIBIOTIC NOTE   Pharmacy has received consult(s) for Cefepime and Vancomycin from an ED provider.  The patient's profile has been reviewed for ht/wt/allergies/indication/available labs.    One time order(s) placed for Cefepime 2gm and Vancomycin 1gm  Further antibiotics/pharmacy consults should be ordered by admitting physician if indicated.                       Thank you, Ena Dawley 08/24/2019  9:58 PM

## 2019-08-24 NOTE — H&P (Signed)
History and Physical        Hospital Admission Note Date: 08/25/2019  Patient name: Albert [name] Medical record number: [id number] Date of birth: 11/04/1933 Age: 86 y.o. Gender: male  PCP: [name], MD    Patient coming from: SNF   I have reviewed all records in the [facility].    Chief Complaint:  Fever   HPI: Albert [name] is 86 y.o. male with PMH of CKD, HTN, HLD with recent admission for GI bleed and urosepsis presents from SNF with concerns for fever and hypotensive. Patient endorses cough for 3-4 days with sputum production. Also reports 2-3 weeks of dysuria. Denies urinary urgency or frequency. Denies further melena or blood in stool. No hematemesis or hemoptysis.    ED work-up/course:  86 year old male with PMH as noted above presents with fever and hypotension noted at his facility today.  EMS gave Tylenol and fluids.  The patient reports some generalized weakness but denies most focal symptoms.  I reviewed the past medical records in epic.  The patient was recently admitted to the hospital and discharged 10 days ago for weakness and melena.  He was treated for cystitis with antibiotics and placed on octreotide and Protonix drips and transfused 1 unit PRBCs for the GI bleed.  Previously he had been admitted for upper GI bleed and diagnosed with esophageal and duodenal ulcers at the end of last month.  On exam today, the patient is weak and frail appearing but alert and oriented.  He is febrile, hypotensive, and tachycardic with a normal O2 saturation.  Lungs are clear bilaterally.  Mucous membranes are dry.  Neurologic exam is nonfocal.  Exam is otherwise as described above.  Overall presentation is most consistent with acute infection/sepsis, either recurrent UTI/cystitis, versus possible pulmonary etiology including COVID-19 or bacterial pneumonia.  I have a lower suspicion for his abnormal vital  signs being related to GI bleed given the lack of any hematemesis, recent melena, or other specific symptoms to suggest GI etiology.  I have ordered fluids and lab work-up per sepsis protocol.  We will start broad-spectrum antibiotics.  ----------------------------------------- 10:55 PM on 08/24/2019 -----------------------------------------  The patient's vital signs are improving with fluids.  His lactate is normal.  Antibiotics have been given.  I discussed the case with the hospitalist for admission.  Review of Systems: Positives marked in 'bold' Constitutional: Denies fever, chills, diaphoresis, poor appetite and fatigue.  HEENT: Denies photophobia, eye pain, redness, hearing loss, ear pain, congestion, sore throat, rhinorrhea, sneezing, mouth sores, trouble swallowing, neck pain, neck stiffness and tinnitus.   Respiratory: Denies SOB, DOE, cough, chest tightness,  and wheezing.   Cardiovascular: Denies chest pain, palpitations and leg swelling.  Gastrointestinal: Denies nausea, vomiting, abdominal pain, diarrhea, constipation, blood in stool and abdominal distention.  Genitourinary: Denies dysuria, urgency, frequency, hematuria, flank pain and difficulty urinating.  Musculoskeletal: Denies myalgias, back pain, joint swelling, arthralgias and gait problem.  Skin: Denies pallor, rash and wound.  Neurological: Denies dizziness, seizures, syncope, weakness, light-headedness, numbness and headaches.  Hematological: Denies adenopathy. Easy bruising, personal or family bleeding history  Psychiatric/Behavioral: Denies suicidal ideation, mood changes, confusion, nervousness, sleep disturbance and  agitation  Past Medical History: Past Medical History:  Diagnosis Date  . Actinic keratosis 04/23/2008   L elbow distal tricep (bx proven)  . Basal cell carcinoma 06/17/2006   L preauricular   . Basal cell carcinoma 05/26/2012   L ant neck   . Basal cell carcinoma 11/02/2012   Glabella -  excision   . Basal cell carcinoma 08/15/2014   L med ankle   . Basal cell carcinoma 02/23/2017   R lat infrapectoral - excision  . Basal cell carcinoma 12/08/2017   L mid to distal lat volar forearm   . Hypertension   . Squamous cell carcinoma of skin 02/14/2014   R lat low back - ED&C  . Squamous cell carcinoma of skin 10/02/2014   R prox lat bicep - ED&C    Past Surgical History:  Procedure Laterality Date  . COLONOSCOPY N/A 08/05/2019   Procedure: COLONOSCOPY;  Surgeon: Toledo, Benay Pike, MD;  Location: [facility];  Service: Gastroenterology;  Laterality: N/A;  . ESOPHAGOGASTRODUODENOSCOPY N/A 08/05/2019   Procedure: ESOPHAGOGASTRODUODENOSCOPY (EGD);  Surgeon: Toledo, Benay Pike, MD;  Location: [facility];  Service: Gastroenterology;  Laterality: N/A;    Medications: Prior to Admission medications   Medication Sig Start Date End Date Taking? Authorizing Provider  aspirin EC 81 MG tablet Take 1 tablet (81 mg total) by mouth daily. Swallow whole.start 08/19/19 08/08/19 08/07/20  Georgette Shell, MD  atorvastatin (LIPITOR) 20 MG tablet Take 20 mg by mouth daily. 11/20/18   [provider]  Multiple Vitamin (MULTIVITAMIN WITH MINERALS) TABS tablet Take 1 tablet by mouth daily. 08/09/19   Georgette Shell, MD  ondansetron (ZOFRAN) 4 MG tablet Take 1 tablet (4 mg total) by mouth every 6 (six) hours as needed for nausea. 08/08/19   Georgette Shell, MD  pantoprazole (PROTONIX) 40 MG tablet Take 1 tablet (40 mg total) by mouth daily. 08/09/19   Georgette Shell, MD    Allergies:   Allergies  Allergen Reactions  . Penicillins     POA reports penicillin as allergy but unsure of reaction    Social History:  reports that he has never smoked. He has never used smokeless tobacco. He reports previous alcohol use. He reports that he does not use drugs.  Family History: No family history on file.  Physical Exam: Blood pressure 99/67, pulse (!) 168, temperature 99  F (37.2 C), temperature source Oral, resp. rate (!) 23, height 5\' 4"  (1.626 m), weight 69.8 kg, SpO2 97 %. General: Alert, awake, oriented x3, in no acute distress. Chronically frail appearing.  Eyes: pink conjunctiva,anicteric sclera, pupils equal and reactive to light and accomodation, HEENT: normocephalic, atraumatic, oropharynx clear Neck: supple, no masses or lymphadenopathy, no goiter, no bruits, no JVD CVS: Tachycardic rate and rhythm, without murmurs, rubs or gallops. No lower extremity edema Resp : Clear to auscultation bilaterally, no wheezing, rales or rhonchi. GI : Soft, nontender, nondistended, positive bowel sounds, no masses. No hepatomegaly. No hernia.  Musculoskeletal: No clubbing or cyanosis, positive pedal pulses. No contracture. ROM intact  Neuro: Grossly intact, no focal neurological deficits, strength 5/5 upper and lower extremities bilaterally Psych: alert and oriented x 3, normal mood and affect Skin: no rashes or lesions, warm and dry   LABS on Admission: I have personally reviewed all the labs and imagings below    Basic Metabolic Panel: Recent Labs  Lab 08/24/19 2037  NA 140  K 4.5  CL 109  CO2 21*  GLUCOSE 111*  BUN 24*  CREATININE 1.21  CALCIUM 7.5*   Liver Function Tests: Recent Labs  Lab 08/24/19 2037  AST 35  ALT 15  ALKPHOS 65  BILITOT 1.0  PROT 5.7*  ALBUMIN 2.2*   No results for input(s): LIPASE, AMYLASE in the last 168 hours. No results for input(s): AMMONIA in the last 168 hours. CBC: Recent Labs  Lab 08/24/19 2037  WBC 17.3*  NEUTROABS 15.0*  HGB 9.1*  HCT 29.3*  MCV 92.7  PLT 246   Cardiac Enzymes: No results for input(s): CKTOTAL, CKMB, CKMBINDEX, TROPONINI in the last 168 hours. BNP: Invalid input(s): POCBNP CBG: No results for input(s): GLUCAP in the last 168 hours.  Radiological Exams on Admission:  DG Chest Port 1 View  Result Date: 08/24/2019 CLINICAL DATA:  Fever and tachycardia. EXAM: PORTABLE CHEST 1 VIEW  COMPARISON:  August 09, 2019 FINDINGS: Mild, diffuse chronic appearing increased lung markings are seen. Mild right basilar infiltrate is present. There is no evidence of a pleural effusion or pneumothorax. The cardiac silhouette is mildly enlarged. Moderate severity calcification of the aortic arch is noted. The visualized skeletal structures are unremarkable. IMPRESSION: 1. Chronic appearing increased lung markings with mild right basilar infiltrate. Electronically Signed   By: Virgina Norfolk M.D.   On: 08/24/2019 21:00      EKG: Independently reviewed. Sinus tachycardia. PACs present. Nonspecific ST changes without evidence of acute ischemia.    Assessment/Plan Active Problems:   Elevated troponin   Benign essential hypertension   CKD (chronic kidney disease), stage III   Sepsis (HCC)   Lactic acidosis   Anemia   Elevated brain natriuretic peptide (BNP) level  Sepsis  Patient presenting with fever, hypotension, and tachycardia. No hypoxia. Exam is benign. Leukocytosis to 17. Presentation consistent with infection. Potentially recurrent cystitis vs. Pneumonia or COVID. While patient had recent admission for GI bleed, he does not have any melena, hematochezia, or hematemesis and HgB is improved compared to admission. Lower suspicion that abnormalities are related to GI bleed. In ED, received 2L NS bolus and was given Vancomycin and Cefepime. Vital signs improved. Lactic acid increased from 1.6 >3.7. UA less concerning for infection with no nitrites, leukocytes, bacteria, WBCs. 1 view CXR with mild right basilar infiltrate. -admit to SDU with tele  -antibiotic therapy with Vancomycin, Aztreonam (due to PCN allergy--potentially convert to cephalosporin pending pharm recs), and Flagyl for unknown source---narrow as appropriate  -additional 1L NS bolus for lactic acidosis followed by 125 cc/hr  -trend lactic acid  -CBC in AM  -PCT ordered  -obtain 2 view CXR  -blood and urine cultures  pending  -COVID test pending   Elevated Troponin  97 > 93. Suspect related to troponin leak from sepsis. No EKG changes to suggest acute ischemia and patient does not have symptoms concerning for ACS.  -monitor   Grade I Diastolic CHF with elevated BNP  EF persevered on echo obtained 08/06/19. Mild diastolic dysfunction present. BNP 728. Suspect elevated related to sepsis. Patient does not have any signs of volume overload on exam.  -monitor volume status with IVFs   Anemia  HgB 9.1. Normocytic. Improved from recent low symptomatic anemia requiring 1u PRBC. No signs of acute bleed.  -CBC in AM   CKD Stage III Kidney function appears at baseline.  -daily BMET   HLD  -continue home statin    DVT prophylaxis: Lovenox   CODE STATUS: FULL   Consults called: None   Admission status: Inpatient   The medical  decision making on this patient was of high complexity and the patient is at high risk for clinical deterioration, therefore this is a level 3 admission.  Severity of Illness:     Severe  The appropriate patient status for this patient is INPATIENT. Inpatient status is judged to be reasonable and necessary in order to provide the required intensity of service to ensure the patient's safety. The patient's presenting symptoms, physical exam findings, and initial radiographic and laboratory data in the context of their chronic comorbidities is felt to place them at high risk for further clinical deterioration. Furthermore, it is not anticipated that the patient will be medically stable for discharge from the hospital within 2 midnights of admission. The following factors support the patient status of inpatient.   " The patient's presenting symptoms include fever, chills. " The worrisome physical exam findings include hypotension, tachycardia, fever. " The initial radiographic and laboratory data are worrisome because of CXR with infiltrate, leukocytosis, lactic acidosis. " The chronic  co-morbidities include HLD, recent urosepsis and acute GI bleed with symptomatic anemia.   * I certify that at the point of admission it is my clinical judgment that the patient will require inpatient hospital care spanning beyond 2 midnights from the point of admission due to high intensity of service, high risk for further deterioration and high frequency of surveillance required.*    Time Spent on Admission: 58 minutes      Melina Schools D.O.  [facility] 08/25/2019, 12:07 AM

## 2019-08-24 NOTE — ED Triage Notes (Deleted)
10 days ago septic  Wet productive cough Tylenol 660 mg given by EMS  Fever 100.4 This morning 102 fever with Tylenol given with good relief.

## 2019-08-24 NOTE — ED Triage Notes (Addendum)
Pt comes via EMS from [facility] with c/o fever, tachycardic. Pt recently treated for UTI with septic shock and admitted for 10 days.   Facility called EMs for pt's fever. Pt was given tylenol at facility and then later developed fever again of 102.  HR-120-140, O2-92%, pt placed on 2L [place], CBG-116, ETCO2-25, BP-101/54  EMs gave 660 of tylenol, 500 fluids, 18g in place  Pt is A*OX4  Pt also c/o sore to left hip area

## 2019-08-25 DIAGNOSIS — R531 Weakness: Secondary | ICD-10-CM | POA: Diagnosis not present

## 2019-08-25 DIAGNOSIS — M25511 Pain in right shoulder: Secondary | ICD-10-CM | POA: Diagnosis present

## 2019-08-25 DIAGNOSIS — Z515 Encounter for palliative care: Secondary | ICD-10-CM | POA: Diagnosis not present

## 2019-08-25 DIAGNOSIS — E872 Acidosis: Secondary | ICD-10-CM | POA: Diagnosis present

## 2019-08-25 DIAGNOSIS — E875 Hyperkalemia: Secondary | ICD-10-CM | POA: Diagnosis not present

## 2019-08-25 DIAGNOSIS — R471 Dysarthria and anarthria: Secondary | ICD-10-CM | POA: Diagnosis present

## 2019-08-25 DIAGNOSIS — I959 Hypotension, unspecified: Secondary | ICD-10-CM | POA: Diagnosis present

## 2019-08-25 DIAGNOSIS — I472 Ventricular tachycardia: Secondary | ICD-10-CM

## 2019-08-25 DIAGNOSIS — R300 Dysuria: Secondary | ICD-10-CM | POA: Diagnosis present

## 2019-08-25 DIAGNOSIS — K2210 Ulcer of esophagus without bleeding: Secondary | ICD-10-CM | POA: Diagnosis present

## 2019-08-25 DIAGNOSIS — J181 Lobar pneumonia, unspecified organism: Secondary | ICD-10-CM | POA: Insufficient documentation

## 2019-08-25 DIAGNOSIS — E785 Hyperlipidemia, unspecified: Secondary | ICD-10-CM

## 2019-08-25 DIAGNOSIS — A0472 Enterocolitis due to Clostridium difficile, not specified as recurrent: Secondary | ICD-10-CM | POA: Diagnosis present

## 2019-08-25 DIAGNOSIS — R7989 Other specified abnormal findings of blood chemistry: Secondary | ICD-10-CM | POA: Diagnosis not present

## 2019-08-25 DIAGNOSIS — D649 Anemia, unspecified: Secondary | ICD-10-CM | POA: Diagnosis not present

## 2019-08-25 DIAGNOSIS — D509 Iron deficiency anemia, unspecified: Secondary | ICD-10-CM | POA: Diagnosis not present

## 2019-08-25 DIAGNOSIS — I479 Paroxysmal tachycardia, unspecified: Secondary | ICD-10-CM | POA: Diagnosis not present

## 2019-08-25 DIAGNOSIS — E43 Unspecified severe protein-calorie malnutrition: Secondary | ICD-10-CM | POA: Diagnosis present

## 2019-08-25 DIAGNOSIS — R778 Other specified abnormalities of plasma proteins: Secondary | ICD-10-CM | POA: Diagnosis not present

## 2019-08-25 DIAGNOSIS — M25512 Pain in left shoulder: Secondary | ICD-10-CM | POA: Diagnosis present

## 2019-08-25 DIAGNOSIS — I10 Essential (primary) hypertension: Secondary | ICD-10-CM | POA: Diagnosis not present

## 2019-08-25 DIAGNOSIS — I5030 Unspecified diastolic (congestive) heart failure: Secondary | ICD-10-CM | POA: Diagnosis present

## 2019-08-25 DIAGNOSIS — N1832 Chronic kidney disease, stage 3b: Secondary | ICD-10-CM | POA: Diagnosis present

## 2019-08-25 DIAGNOSIS — R54 Age-related physical debility: Secondary | ICD-10-CM | POA: Diagnosis present

## 2019-08-25 DIAGNOSIS — I480 Paroxysmal atrial fibrillation: Secondary | ICD-10-CM | POA: Diagnosis present

## 2019-08-25 DIAGNOSIS — E870 Hyperosmolality and hypernatremia: Secondary | ICD-10-CM | POA: Diagnosis not present

## 2019-08-25 DIAGNOSIS — J90 Pleural effusion, not elsewhere classified: Secondary | ICD-10-CM | POA: Diagnosis present

## 2019-08-25 DIAGNOSIS — R29898 Other symptoms and signs involving the musculoskeletal system: Secondary | ICD-10-CM | POA: Diagnosis not present

## 2019-08-25 DIAGNOSIS — A419 Sepsis, unspecified organism: Secondary | ICD-10-CM | POA: Diagnosis present

## 2019-08-25 DIAGNOSIS — N1831 Chronic kidney disease, stage 3a: Secondary | ICD-10-CM | POA: Diagnosis not present

## 2019-08-25 DIAGNOSIS — I82409 Acute embolism and thrombosis of unspecified deep veins of unspecified lower extremity: Secondary | ICD-10-CM | POA: Diagnosis not present

## 2019-08-25 DIAGNOSIS — R000 Tachycardia, unspecified: Secondary | ICD-10-CM | POA: Insufficient documentation

## 2019-08-25 DIAGNOSIS — M359 Systemic involvement of connective tissue, unspecified: Secondary | ICD-10-CM | POA: Diagnosis not present

## 2019-08-25 DIAGNOSIS — Z20822 Contact with and (suspected) exposure to covid-19: Secondary | ICD-10-CM | POA: Diagnosis present

## 2019-08-25 DIAGNOSIS — K2100 Gastro-esophageal reflux disease with esophagitis, without bleeding: Secondary | ICD-10-CM | POA: Diagnosis present

## 2019-08-25 DIAGNOSIS — J189 Pneumonia, unspecified organism: Secondary | ICD-10-CM | POA: Diagnosis present

## 2019-08-25 DIAGNOSIS — Y95 Nosocomial condition: Secondary | ICD-10-CM | POA: Diagnosis present

## 2019-08-25 DIAGNOSIS — R1310 Dysphagia, unspecified: Secondary | ICD-10-CM | POA: Diagnosis not present

## 2019-08-25 DIAGNOSIS — I130 Hypertensive heart and chronic kidney disease with heart failure and stage 1 through stage 4 chronic kidney disease, or unspecified chronic kidney disease: Secondary | ICD-10-CM | POA: Diagnosis present

## 2019-08-25 DIAGNOSIS — Z7189 Other specified counseling: Secondary | ICD-10-CM | POA: Diagnosis not present

## 2019-08-25 DIAGNOSIS — J690 Pneumonitis due to inhalation of food and vomit: Secondary | ICD-10-CM | POA: Diagnosis present

## 2019-08-25 DIAGNOSIS — G9520 Unspecified cord compression: Secondary | ICD-10-CM | POA: Diagnosis present

## 2019-08-25 DIAGNOSIS — G729 Myopathy, unspecified: Secondary | ICD-10-CM | POA: Diagnosis not present

## 2019-08-25 DIAGNOSIS — D729 Disorder of white blood cells, unspecified: Secondary | ICD-10-CM | POA: Diagnosis not present

## 2019-08-25 LAB — BASIC METABOLIC PANEL
Anion gap: 8 (ref 5–15)
BUN: 24 mg/dL — ABNORMAL HIGH (ref 8–23)
CO2: 20 mmol/L — ABNORMAL LOW (ref 22–32)
Calcium: 7.3 mg/dL — ABNORMAL LOW (ref 8.9–10.3)
Chloride: 111 mmol/L (ref 98–111)
Creatinine, Ser: 1.13 mg/dL (ref 0.61–1.24)
GFR calc Af Amer: >60 mL/min (ref >60)
GFR calc non Af Amer: 59 mL/min — ABNORMAL LOW (ref >60)
Glucose, Bld: 94 mg/dL (ref 70–99)
Potassium: 4.5 mmol/L (ref 3.5–5.1)
Sodium: 139 mmol/L (ref 135–145)

## 2019-08-25 LAB — CBC
HCT: 29.1 % — ABNORMAL LOW (ref 39.0–52.0)
Hemoglobin: 9 g/dL — ABNORMAL LOW (ref 13.0–17.0)
MCH: 29.1 pg (ref 26.0–34.0)
MCHC: 30.9 g/dL (ref 30.0–36.0)
MCV: 94.2 fL (ref 80.0–100.0)
Platelets: 207 10*3/uL (ref 150–400)
RBC: 3.09 MIL/uL — ABNORMAL LOW (ref 4.22–5.81)
RDW: 17.4 % — ABNORMAL HIGH (ref 11.5–15.5)
WBC: 14.6 10*3/uL — ABNORMAL HIGH (ref 4.0–10.5)
nRBC: 0 % (ref 0.0–0.2)

## 2019-08-25 LAB — PROTIME-INR
INR: 1.3 — ABNORMAL HIGH (ref 0.8–1.2)
Prothrombin Time: 15.6 seconds — ABNORMAL HIGH (ref 11.4–15.2)

## 2019-08-25 LAB — LACTIC ACID, PLASMA
Lactic Acid, Venous: 2.9 mmol/L (ref 0.5–1.9)
Lactic Acid, Venous: 1.8 mmol/L (ref 0.5–1.9)

## 2019-08-25 LAB — CORTISOL-AM, BLOOD: Cortisol - AM: 28.2 ug/dL — ABNORMAL HIGH (ref 6.7–22.6)

## 2019-08-25 LAB — MRSA PCR SCREENING: MRSA by PCR: NEGATIVE

## 2019-08-25 LAB — FERRITIN: Ferritin: 176 ng/mL (ref 24–336)

## 2019-08-25 LAB — PROCALCITONIN: Procalcitonin: 1.15 ng/mL

## 2019-08-25 MED ORDER — ACETAMINOPHEN 650 MG RE SUPP
650.0000 mg | Freq: Four times a day (QID) | RECTAL | Status: DC | PRN
  Administered 2019-09-02 – 2019-09-06 (×2): 650 mg via RECTAL
  Filled 2019-08-25 (×2): qty 1

## 2019-08-25 MED ORDER — LACTATED RINGERS IV BOLUS
250.0000 mL | Freq: Once | INTRAVENOUS | Status: AC
  Administered 2019-08-25: 250 mL via INTRAVENOUS

## 2019-08-25 MED ORDER — LACTATED RINGERS IV SOLN: INTRAVENOUS | Status: DC

## 2019-08-25 MED ORDER — AMIODARONE HCL IN DEXTROSE 360-4.14 MG/200ML-% IV SOLN: 30.0000 mg/h | INTRAVENOUS | Status: DC

## 2019-08-25 MED ORDER — METRONIDAZOLE IN NACL 5-0.79 MG/ML-% IV SOLN
500.0000 mg | Freq: Three times a day (TID) | INTRAVENOUS | Status: DC
  Administered 2019-08-25 (×2): 500 mg via INTRAVENOUS
  Filled 2019-08-25 (×2): qty 100

## 2019-08-25 MED ORDER — SODIUM CHLORIDE 0.9 % IV SOLN
2.0000 g | Freq: Once | INTRAVENOUS | Status: AC
  Administered 2019-08-25: 2 g via INTRAVENOUS
  Filled 2019-08-25: qty 2

## 2019-08-25 MED ORDER — ATORVASTATIN CALCIUM 20 MG PO TABS
20.0000 mg | ORAL_TABLET | Freq: Every day | ORAL | Status: DC
  Administered 2019-08-25 – 2019-08-26 (×2): 20 mg via ORAL
  Filled 2019-08-25 (×2): qty 1

## 2019-08-25 MED ORDER — ACETAMINOPHEN 325 MG PO TABS
650.0000 mg | ORAL_TABLET | Freq: Four times a day (QID) | ORAL | Status: DC | PRN
  Administered 2019-08-25 – 2019-09-12 (×15): 650 mg via ORAL
  Filled 2019-08-25 (×16): qty 2

## 2019-08-25 MED ORDER — SODIUM CHLORIDE 0.9 % IV SOLN
3.0000 g | Freq: Four times a day (QID) | INTRAVENOUS | Status: AC
  Administered 2019-08-25 – 2019-08-29 (×17): 3 g via INTRAVENOUS
  Filled 2019-08-25: qty 3
  Filled 2019-08-25: qty 8
  Filled 2019-08-25 (×4): qty 0.86
  Filled 2019-08-25: qty 3
  Filled 2019-08-25: qty 8
  Filled 2019-08-25 (×3): qty 3
  Filled 2019-08-25 (×3): qty 0.86
  Filled 2019-08-25 (×3): qty 3

## 2019-08-25 MED ORDER — VANCOMYCIN HCL IN DEXTROSE 1-5 GM/200ML-% IV SOLN
1000.0000 mg | Freq: Once | INTRAVENOUS | Status: AC
  Administered 2019-08-25: 1000 mg via INTRAVENOUS
  Filled 2019-08-25: qty 200

## 2019-08-25 MED ORDER — VANCOMYCIN HCL IN DEXTROSE 1-5 GM/200ML-% IV SOLN: 1000.0000 mg | INTRAVENOUS | Status: DC

## 2019-08-25 MED ORDER — PANTOPRAZOLE SODIUM 40 MG PO TBEC
40.0000 mg | DELAYED_RELEASE_TABLET | Freq: Every day | ORAL | Status: DC
  Administered 2019-08-25 – 2019-08-26 (×2): 40 mg via ORAL
  Filled 2019-08-25 (×2): qty 1

## 2019-08-25 MED ORDER — SODIUM CHLORIDE 0.9 % IV SOLN: INTRAVENOUS | Status: DC

## 2019-08-25 MED ORDER — SODIUM CHLORIDE 0.9 % IV SOLN
2.0000 g | Freq: Two times a day (BID) | INTRAVENOUS | Status: DC
  Administered 2019-08-25: 2 g via INTRAVENOUS
  Filled 2019-08-25: qty 2

## 2019-08-25 MED ORDER — DOXYCYCLINE HYCLATE 100 MG PO TABS
100.0000 mg | ORAL_TABLET | Freq: Two times a day (BID) | ORAL | Status: DC
  Administered 2019-08-25 – 2019-08-26 (×2): 100 mg via ORAL
  Filled 2019-08-25 (×2): qty 1

## 2019-08-25 MED ORDER — AZTREONAM 1 G IJ SOLR
1.0000 g | Freq: Three times a day (TID) | INTRAMUSCULAR | Status: DC
Start: 2019-08-25 — End: 2019-08-25

## 2019-08-25 MED ORDER — ENOXAPARIN SODIUM 40 MG/0.4ML ~~LOC~~ SOLN
40.0000 mg | SUBCUTANEOUS | Status: DC
  Administered 2019-08-25 – 2019-09-07 (×13): 40 mg via SUBCUTANEOUS
  Filled 2019-08-25 (×13): qty 0.4

## 2019-08-25 MED ORDER — SODIUM CHLORIDE 0.9 % IV BOLUS
1000.0000 mL | Freq: Once | INTRAVENOUS | Status: AC
  Administered 2019-08-25: 1000 mL via INTRAVENOUS

## 2019-08-25 MED ORDER — AMIODARONE HCL IN DEXTROSE 360-4.14 MG/200ML-% IV SOLN
60.0000 mg/h | INTRAVENOUS | Status: AC
  Administered 2019-08-25: 60 mg/h via INTRAVENOUS
  Filled 2019-08-25: qty 200

## 2019-08-25 NOTE — ED Notes (Signed)
Pt reports pain with all touch above the waste and states he has a rash that has been present for "a while". Unable to give accurate time frame

## 2019-08-25 NOTE — Progress Notes (Signed)
Pharmacy Antibiotic Note  Albert [name] is a 86 y.o. male admitted on 08/24/2019 with sepsis.  Pharmacy has been consulted for Vancomycin and Aztreonam dosing.  Plan: Aztreonam 1gm IV q8hrs  Vancomycin 1000 mg IV Q 24 hrs. Goal AUC 400-550. Expected AUC: 520.6, Css min 13.8 SCr used: 1.13   Height: 5\' 4"  (162.6 cm) Weight: 69.8 kg (153 lb 14.1 oz) IBW/kg (Calculated) : 59.2  Temp (24hrs), Avg:99.9 F (37.7 C), Min:99 F (37.2 C), Max:100.7 F (38.2 C)  Recent Labs  Lab 08/24/19 2037 08/24/19 2255 08/25/19 0130 08/25/19 0131  WBC 17.3*  --  14.6*  --   CREATININE 1.21  --  1.13  --   LATICACIDVEN 1.6 3.7*  --  2.9*    Estimated Creatinine Clearance: 40.7 mL/min (by C-G formula based on SCr of 1.13 mg/dL).    Allergies  Allergen Reactions  . Penicillins     POA reports penicillin as allergy but unsure of reaction    Antimicrobials this admission:   >>    >>   Dose adjustments this admission:   Microbiology results:  BCx:   UCx:    Sputum:    MRSA PCR:   Thank you for allowing pharmacy to be a part of this patient's care.  Hart Robinsons A 08/25/2019 2:41 AM

## 2019-08-25 NOTE — ED Notes (Signed)
Patient's caregiver, [name], states that Albert [name] is saving room 704 until Wednesday. [place]'s phone number is [phone].

## 2019-08-25 NOTE — ED Notes (Signed)
Pt asleep at this time

## 2019-08-25 NOTE — ED Notes (Signed)
Pt received full bed change after BM. Pt placed on right side with pillow under hip for support. Pt complains of pain with any touch at any site. This nurse unable to collect blood for labs at this time, contacted rebecca in lab and she states she will get someone to come collect

## 2019-08-25 NOTE — ED Notes (Signed)
Pt stickers replaced at this time to complete EKG

## 2019-08-25 NOTE — ED Notes (Signed)
Pillow replaced under right hip at this time

## 2019-08-25 NOTE — ED Notes (Signed)
MD notified of pt HR elevating to 165BPM at this time, orders to follow.

## 2019-08-25 NOTE — ED Notes (Signed)
Side rail lowered so patient could feed himself his dinner tray.

## 2019-08-25 NOTE — ED Notes (Signed)
Lab at bedside

## 2019-08-25 NOTE — ED Notes (Signed)
Benjamine Mola, NP notified of PT HR still being elevated at this time.

## 2019-08-25 NOTE — ED Notes (Addendum)
Lab, Albert [name], contacted due to pt hard stick and needing of blood collection. States they will send someone to ED. Pt now repositioned to left side with pillow supporting pt. Pt states he will stay this way for time being. Albert Mola, NP reached out to about pt BP

## 2019-08-25 NOTE — ED Notes (Signed)
Lab called again due to need for blood tube collected. States that someone is on way

## 2019-08-25 NOTE — Consult Note (Signed)
Cardiology Consultation:   Patient ID: [name] MRN: [id number]; DOB: 10-29-1933  Admit date: 08/24/2019 Date of Consult: 08/25/2019  Primary Care Provider: [name], MD Primary Cardiologist:[facility], Dr. Fletcher Anon Primary Electrophysiologist:  None    Patient Profile:   Albert [name] is a 86 y.o. male with a hx of hypertension, hyperlipidemia, CKD, history of COVID-19 infection winter 2020, h/o CA, h/o recent admission with GI bleed, esophageal and duodenal ulcers, urosepsis present from SNF, and who is being seen today for the evaluation of abnormal EKG and elevated rates in the setting of sepsis at the request of Dr. Earleen Newport.  History of Present Illness:   Albert [name] is an 86 year old male with PMH as above.  HPI limited by patient's current mental status and the below is obtained via chart biopsy and patient report today has no family was in the room at the time of my evaluation.  He was admitted 7/29 for upper GI bleed.  He had reportedly experienced weakness x 1 week  with 1d abdominal discomfort, nausea, dry heaving, and diarrhea. EMS reported finding him in a pool of maroon blood.  ED labs showed AKI with creatinine 2.63 (baseline 1.3).  INR 2.2.  Vitals significant for SBP 90s.  EKG showed sinus tachycardia with 110 bpm, PVCs, T WI.  He was given 1 L LR, IV vitamin K, IV Protonix, and 1 itun of PRBC. He underwent EGD 08/05/2019 and colonoscopy 7/31.  He was diagnosed with esophageal and duodenal ulcers with biopsy taken of the antrum.  Also noted with a 2 cm hiatal hernia as well as diverticulosis with blood in the rectum and colon.  His antihypertensive was stopped before discharge.  Recommendation was SNF, refused by patient.  8/1 echo showed normal EF 60 to 65%, NRWMA, mild LVH, G1DD, severe LAE, moderate RVE, mild RAE, mild MR, mild to moderate AS.  He was admitted 8/4- 08/14/2019 for weakness and melena.  He was treated for cystitis with antibiotics.  Due to GI bleed, he  was treated with octreotide GTT, Protonix.  He was transfused with 1 unit PRBC.  CTA without evidence of active bleed.  RUQ with distended gallbladder and biliary sludge but no evidence of gallstones or cholecystitis.  It was noted that he had atrial fibrillation (?telemetry?, not seen on EKG on review of EMR) with rate well controlled, as well as mildly elevated troponin (peak 43) with suspicion that likely 2/2 demand ischemia. Cardiology was not consulted.    He presented to [facility] ED 08/24/2018 with fever and low blood pressure.  He reported labile BP, weakness, cough, and bilateral shoulder pain.  On consultation today, he reports that he presented to [facility] ED due to racing heart rate and feeling weak and febrile.  He denies chest pain, palpitations, orthopnea, lower extremity edema, abdominal distention, and dizziness.  He reports shortness of breath and dyspnea on exertion.  He states that his shortness of breath sometimes improves when he lays down and gets worse when he sits up. He has brown sputum on his lap, which he states he coughed up before I came into the room and is typical for him.  He tells me that he has been feeling unwell ever since contracting COVID-19 back in November/December 2020.  He reports feeling better today than yesterday. He states that he has been fighting pneumonia since that time with multiple admissions.  In the ED, BP 76/50, HR 123 bpm, RR 22, temperature 100.7 F, 97% SpO2.  Labs  showed albumin 2.2, Cr 1.21, BUN 24, K 4.5, BNP 728.7, HS Tn 97  93, lactic acid 1.6  3.7,  hemoglobin 9.1, hematocrit 29.3, RBC 3.16, WBC 17.3.  Chest x-ray showed chronic appearing increased lung markings with mild right basilar infiltrate.   Heart Pathway Score:     Past Medical History:  Diagnosis Date  . Actinic keratosis 04/23/2008   L elbow distal tricep (bx proven)  . Basal cell carcinoma 06/17/2006   L preauricular   . Basal cell carcinoma 05/26/2012   L ant neck   . Basal cell  carcinoma 11/02/2012   Glabella - excision   . Basal cell carcinoma 08/15/2014   L med ankle   . Basal cell carcinoma 02/23/2017   R lat infrapectoral - excision  . Basal cell carcinoma 12/08/2017   L mid to distal lat volar forearm   . Hypertension   . Squamous cell carcinoma of skin 02/14/2014   R lat low back - ED&C  . Squamous cell carcinoma of skin 10/02/2014   R prox lat bicep - ED&C    Past Surgical History:  Procedure Laterality Date  . COLONOSCOPY N/A 08/05/2019   Procedure: COLONOSCOPY;  Surgeon: Toledo, Benay Pike, MD;  Location: [facility] ENDOSCOPY;  Service: Gastroenterology;  Laterality: N/A;  . ESOPHAGOGASTRODUODENOSCOPY N/A 08/05/2019   Procedure: ESOPHAGOGASTRODUODENOSCOPY (EGD);  Surgeon: Toledo, Benay Pike, MD;  Location: [facility] ENDOSCOPY;  Service: Gastroenterology;  Laterality: N/A;     Home Medications:  Prior to Admission medications   Medication Sig Start Date End Date Taking? Authorizing Provider  aspirin EC 81 MG tablet Take 1 tablet (81 mg total) by mouth daily. Swallow whole.start 08/19/19 08/08/19 08/07/20  Georgette Shell, MD  atorvastatin (LIPITOR) 20 MG tablet Take 20 mg by mouth daily. 11/20/18   [provider]  Multiple Vitamin (MULTIVITAMIN WITH MINERALS) TABS tablet Take 1 tablet by mouth daily. 08/09/19   Georgette Shell, MD  ondansetron (ZOFRAN) 4 MG tablet Take 1 tablet (4 mg total) by mouth every 6 (six) hours as needed for nausea. 08/08/19   Georgette Shell, MD  pantoprazole (PROTONIX) 40 MG tablet Take 1 tablet (40 mg total) by mouth daily. 08/09/19   Georgette Shell, MD    Inpatient Medications: Scheduled Meds: . atorvastatin  20 mg Oral Daily  . enoxaparin (LOVENOX) injection  40 mg Subcutaneous Q24H  . pantoprazole  40 mg Oral Daily   Continuous Infusions: . sodium chloride 125 mL/hr at 08/25/19 0121  . amiodarone 60 mg/hr (08/25/19 0252)  . amiodarone    . ceFEPime (MAXIPIME) IV    . metronidazole Stopped (08/25/19  0205)  . vancomycin     PRN Meds: acetaminophen **OR** acetaminophen  Allergies:    Allergies  Allergen Reactions  . Penicillins     POA reports penicillin as allergy but unsure of reaction    Social History:   Social History   Socioeconomic History  . Marital status: Widowed    Spouse name: Not on file  . Number of children: Not on file  . Years of education: Not on file  . Highest education level: Not on file  Occupational History  . Not on file  Tobacco Use  . Smoking status: Never Smoker  . Smokeless tobacco: Never Used  Vaping Use  . Vaping Use: Never used  Substance and Sexual Activity  . Alcohol use: Not Currently  . Drug use: Never  . Sexual activity: Not on file  Other Topics Concern  .  Not on file  Social History Narrative  . Not on file   Social Determinants of Health   Financial Resource Strain:   . Difficulty of Paying Living Expenses: Not on file  Food Insecurity:   . Worried About Charity fundraiser in the Last Year: Not on file  . Ran Out of Food in the Last Year: Not on file  Transportation Needs:   . Lack of Transportation (Medical): Not on file  . Lack of Transportation (Non-Medical): Not on file  Physical Activity:   . Days of Exercise per Week: Not on file  . Minutes of Exercise per Session: Not on file  Stress:   . Feeling of Stress : Not on file  Social Connections:   . Frequency of Communication with Friends and Family: Not on file  . Frequency of Social Gatherings with Friends and Family: Not on file  . Attends Religious Services: Not on file  . Active Member of Clubs or Organizations: Not on file  . Attends Archivist Meetings: Not on file  . Marital Status: Not on file  Intimate Partner Violence:   . Fear of Current or Ex-Partner: Not on file  . Emotionally Abused: Not on file  . Physically Abused: Not on file  . Sexually Abused: Not on file    Family History:   No family history on file.  Denies family history  of heart disease. ROS:  Please see the history of present illness.  Review of Systems  Unable to perform ROS: Mental status change  Constitutional: Positive for chills, fever, malaise/fatigue and weight loss.  Respiratory: Positive for cough, sputum production, shortness of breath and wheezing.   Cardiovascular: Negative for chest pain, palpitations, orthopnea, leg swelling and PND.       Racing heart rate  Musculoskeletal: Positive for myalgias. Negative for falls.  Neurological: Positive for weakness. Negative for dizziness and loss of consciousness.  All other systems reviewed and are negative.   All other ROS reviewed and negative.     Physical Exam/Data:   Vitals:   08/25/19 0500 08/25/19 0530 08/25/19 0536 08/25/19 0630  BP: (!) 81/59 (!) 83/52 (!) 89/50 (!) 84/57  Pulse: (!) 116 (!) 110 (!) 110 (!) 106  Resp: 20 19 (!) 22 20  Temp:      TempSrc:      SpO2: 99% 100% 95% 100%  Weight:      Height:       No intake or output data in the 24 hours ending 08/25/19 0823 Last 3 Weights 08/24/2019 08/14/2019 08/13/2019  Weight (lbs) 153 lb 14.1 oz 153 lb 14.1 oz 151 lb 7.3 oz  Weight (kg) 69.8 kg 69.8 kg 68.7 kg     Body mass index is 26.41 kg/m.  General: Cachectic and elderly male, ill-appearing HEENT: normal Neck: no JVD Vascular: No carotid bruits; radial pulses 1+ bilaterally Cardiac:  normal S1, S2; mildly tachycardic but regular; 1/6 systolic murmur Lungs:  Bilateral rhonchi Abd: soft, nontender, no hepatomegaly  Ext: no edema Musculoskeletal:  No deformities, BUE and BLE strength normal and equal Skin: warm and dry  Neuro:  No focal abnormalities noted Psych:  Normal affect   EKG:  The EKG was personally reviewed and demonstrates: ST, 121 bpm, QRS 123 ms, IVCD and LVH with repolarization changes Telemetry:  Telemetry was personally reviewed and demonstrates: Review of telemetry significant for early AM sinus tachycardia, PVCs, paroxysmal SVT, and runs of VT (most  frequent / longest episodes  of NSVT around 4:30AM on 8/20).   Relevant CV Studies: Echo 08/06/19 1. Left ventricular ejection fraction, by estimation, is 60 to 65%. The  left ventricle has normal function. The left ventricle has no regional  wall motion abnormalities. There is mild left ventricular hypertrophy.  Left ventricular diastolic parameters  are consistent with Grade I diastolic dysfunction (impaired relaxation).  2. Right ventricular systolic function is normal. The right ventricular  size is moderately enlarged.  3. Left atrial size was severely dilated.  4. Right atrial size was mildly dilated.  5. The mitral valve is normal in structure. Mild mitral valve  regurgitation. No evidence of mitral stenosis.  6. The aortic valve has an indeterminant number of cusps. Aortic valve  regurgitation is mild. Mild to moderate aortic valve stenosis.   Laboratory Data:  High Sensitivity Troponin:   Recent Labs  Lab 08/09/19 1727 08/09/19 1911 08/24/19 2037 08/24/19 2238  TROPONINIHS 43* 32* 97* 93*     Cardiac EnzymesNo results for input(s): TROPONINI in the last 168 hours. No results for input(s): TROPIPOC in the last 168 hours.  Chemistry Recent Labs  Lab 08/24/19 2037 08/25/19 0130  NA 140 139  K 4.5 4.5  CL 109 111  CO2 21* 20*  GLUCOSE 111* 94  BUN 24* 24*  CREATININE 1.21 1.13  CALCIUM 7.5* 7.3*  GFRNONAA 55* 59*  GFRAA >60 >60  ANIONGAP 10 8    Recent Labs  Lab 08/24/19 2037  PROT 5.7*  ALBUMIN 2.2*  AST 35  ALT 15  ALKPHOS 65  BILITOT 1.0   Hematology Recent Labs  Lab 08/24/19 2037 08/25/19 0130  WBC 17.3* 14.6*  RBC 3.16* 3.09*  HGB 9.1* 9.0*  HCT 29.3* 29.1*  MCV 92.7 94.2  MCH 28.8 29.1  MCHC 31.1 30.9  RDW 17.3* 17.4*  PLT 246 207   BNP Recent Labs  Lab 08/24/19 2037  BNP 728.7*    DDimer No results for input(s): DDIMER in the last 168 hours.   Radiology/Studies:  DG Chest Port 1 View  Result Date: 08/24/2019 CLINICAL  DATA:  Fever and tachycardia. EXAM: PORTABLE CHEST 1 VIEW COMPARISON:  August 09, 2019 FINDINGS: Mild, diffuse chronic appearing increased lung markings are seen. Mild right basilar infiltrate is present. There is no evidence of a pleural effusion or pneumothorax. The cardiac silhouette is mildly enlarged. Moderate severity calcification of the aortic arch is noted. The visualized skeletal structures are unremarkable. IMPRESSION: 1. Chronic appearing increased lung markings with mild right basilar infiltrate. Electronically Signed   By: Virgina Norfolk M.D.   On: 08/24/2019 21:00    Assessment and Plan:   Sinus tachycardia Frequent PVCs pSVT, NSVT --He reports he can occasionally feel his heart race this admission and in the past.  No palpitations. No LOC or dizziness. No previously known personal or family history of arrhythmia or heart disease.  --Review of telemetry significant for early AM sinus tachycardia, PVCs, paroxysmal SVT, and runs of VT (most frequent / longest episodes of NSVT around 4:30AM on 8/20).  --If further arrhythmia, recommend starting IV amiodarone given softer BP precludes BB rate control. Goal HR <110bpm.  Will hold off on starting on amiodarone given NSR at this time. Check baseline TSH with recent LFTs WNL.  --BB not ideal given recent en soft pressures/ hypotension.   --Continue gentle IVF.  --Continue abx to treat his infection.  --Monitor electrolytes.  K goal 4.0 and Mg goal 2.0.   --Suspect tachycardia 2/2 current infection /  HCAP /sepsis with anemia and that rate and ectopy will improve with recovery from his current infection / illness. Considered also is tachycardia 2/2 PE given recent COVID-19 and notes reporting Afib identified on telemetry last admission. Also at risk for hypercoagulable state given h/o CA.  If worsening O2 saturations +/- CP, consider repeat CTA or echo to reassess right heart pressures for further evaluation. --Recommend Zio monitor either at  discharge or once recovered from illness for additional monitoring or once recovered from his illness for further assessment of rhythm, rate, and ectopy.   Elevated HS Tn --No CP.  High-sensitivity troponin minimally elevated and flat trending. Likely supply demand ischemia in the setting of sepsis with hypoalbuminemia and recent GIB.  8/1 echo as above with normal EF and no regional wall motion abnormalities. Not an ideal candidate for further ischemic workup given his illness at this time. No current plan for emergent ischemic work-up/noninvasive ischemic work-up at this time.  For questions or updates, please contact [place] Please consult [email or web address] for contact info under     Signed, Arvil Chaco, PA-C  08/25/2019 8:23 AM

## 2019-08-25 NOTE — Progress Notes (Signed)
Pharmacy Antibiotic Note  Albert [name] is a 86 y.o. male admitted on 08/24/2019 with sepsis.  Pharmacy has been consulted for Unasyn dosing For aspiration pneumonia.  Plan: Unasyn 3 gm IV q6h   Height: 5\' 4"  (162.6 cm) Weight: 69.8 kg (153 lb 14.1 oz) IBW/kg (Calculated) : 59.2  Temp (24hrs), Avg:99.3 F (37.4 C), Min:98.2 F (36.8 C), Max:100.7 F (38.2 C)  Recent Labs  Lab 08/24/19 2037 08/24/19 2255 08/25/19 0130 08/25/19 0131 08/25/19 0703  WBC 17.3*  --  14.6*  --   --   CREATININE 1.21  --  1.13  --   --   LATICACIDVEN 1.6 3.7*  --  2.9* 1.8    Estimated Creatinine Clearance: 40.7 mL/min (by C-G formula based on SCr of 1.13 mg/dL).    Allergies  Allergen Reactions  . Penicillins     POA reports penicillin as allergy but unsure of reaction    Antimicrobials this admission:  Aztreonam 8/19 x 1 Cefepime 8/19 >> 8/20 Vancomycin 8/19  >> 8/20 Doxycycline 8/20 >> Unasyn 8/20 >>  Dose adjustments this admission: N/A  Microbiology results: BCx: pending UCx: pending     MRSA PCR:  COVID NEG  Thank you for allowing pharmacy to be a part of this patient's care.  Noralee Space, PharmD, BCPS Clinical Pharmacist 08/25/2019 4:05 PM

## 2019-08-25 NOTE — ED Notes (Signed)
Patient had large, light brown, soft BM. Patient was cleaned, linens were changed and new diaper is in place. Patient tolerated procedure well.

## 2019-08-25 NOTE — ED Notes (Signed)
Hourly rounding reveals patient sleeping in room. No complaints, stable, in no acute distress.

## 2019-08-25 NOTE — ED Notes (Signed)
Pt asks this nurse to remove his socks, completed by this nurse

## 2019-08-25 NOTE — ED Notes (Signed)
Patient gave permission to give verbal report to caregiver Darlene. Dr. Leslye Peer informed that patient reports difficulty swallowing and would prefer a soft diet. Patient speaks easily without hoarseness, handles oral secretions well.

## 2019-08-25 NOTE — ED Notes (Signed)
Patient didn't eat his lunch, states he is having difficulty swallowing and would like a soft diet. Text sent to hospitalist.

## 2019-08-25 NOTE — ED Notes (Signed)
Benjamine Mola, NP updates this nurse and states they are contacting cardiology

## 2019-08-25 NOTE — ED Notes (Signed)
Pt complains of continued pain in the back and hips, requests tylenol

## 2019-08-25 NOTE — Progress Notes (Signed)
Pharmacy Antibiotic Note  Albert [name] is a 86 y.o. male admitted on 08/24/2019 with sepsis.  Pharmacy has been consulted for Vancomycin and Aztreonam dosing.  Plan: Cefepime 2g q12h  Vancomycin 1000 mg IV Q 24 hrs. Goal AUC 400-550. Expected AUC: 520.6, Css min 13.8 SCr used: 1.13   Height: 5\' 4"  (162.6 cm) Weight: 69.8 kg (153 lb 14.1 oz) IBW/kg (Calculated) : 59.2  Temp (24hrs), Avg:99.9 F (37.7 C), Min:99 F (37.2 C), Max:100.7 F (38.2 C)  Recent Labs  Lab 08/24/19 2037 08/24/19 2255 08/25/19 0130 08/25/19 0131  WBC 17.3*  --  14.6*  --   CREATININE 1.21  --  1.13  --   LATICACIDVEN 1.6 3.7*  --  2.9*    Estimated Creatinine Clearance: 40.7 mL/min (by C-G formula based on SCr of 1.13 mg/dL).    Allergies  Allergen Reactions  . Penicillins     POA reports penicillin as allergy but unsure of reaction    Antimicrobials this admission:  Aztreonam 8/19 x 1  Cefepime 8/19 >> Vancomycin 8/19  >>   Dose adjustments this admission: N/A  Microbiology results: BCx: pending UCx: pending     MRSA PCR:  COVID NEG  Thank you for allowing pharmacy to be a part of this patient's care.  Lu Duffel, PharmD, BCPS Clinical Pharmacist 08/25/2019 7:35 AM

## 2019-08-25 NOTE — ED Notes (Signed)
Benjamine Mola, NP contacted at this time and informed of BP

## 2019-08-25 NOTE — Progress Notes (Signed)
Patient ID: Albert [name], male   DOB: December 08, 1933, 86 y.o.   MRN: [id number] [facility] PROGRESS NOTE  Albert [name] YQM:[id number] DOB: 1933/03/02 DOA: 08/24/2019 PCP: Albert Pitch, MD  HPI/Subjective: Patient feeling better than when he came in.  He has been coughing up some brownish phlegm.  Have trouble swallowing some liquids.  He stated he recently had an endoscopy for bleeding.  He had a fever of 102 at home  Objective: Vitals:   08/25/19 1330 08/25/19 1400  BP: (!) 90/56 (!) 116/59  Pulse: 91 (!) 102  Resp:  19  Temp:    SpO2: 100% 99%    Intake/Output Summary (Last 24 hours) at 08/25/2019 1433 Last data filed at 08/25/2019 1143 Gross per 24 hour  Intake 543.5 ml  Output --  Net 543.5 ml   Filed Weights   08/24/19 2053  Weight: 69.8 kg    ROS: Review of Systems  Respiratory: Positive for cough and shortness of breath.   Cardiovascular: Negative for chest pain.  Gastrointestinal: Negative for abdominal pain.   Exam: Physical Exam HENT:     Nose: No mucosal edema.     Mouth/Throat:     Pharynx: No oropharyngeal exudate.  Eyes:     General: Lids are normal.     Conjunctiva/sclera: Conjunctivae normal.     Pupils: Pupils are equal, round, and reactive to light.  Cardiovascular:     Rate and Rhythm: Normal rate and regular rhythm.     Heart sounds: Normal heart sounds, S1 normal and S2 normal.  Pulmonary:     Breath sounds: Examination of the right-lower field reveals decreased breath sounds. Examination of the left-lower field reveals decreased breath sounds. Decreased breath sounds present. No wheezing, rhonchi or rales.  Abdominal:     Palpations: Abdomen is soft.     Tenderness: There is no abdominal tenderness.  Musculoskeletal:     Right ankle: No swelling.     Left ankle: No swelling.  Skin:    General: Skin is warm.     Findings: No rash.  Neurological:     Mental Status: He is alert and oriented to person, place, and time.        Data Reviewed: Basic Metabolic Panel: Recent Labs  Lab 08/24/19 2037 08/25/19 0130  NA 140 139  K 4.5 4.5  CL 109 111  CO2 21* 20*  GLUCOSE 111* 94  BUN 24* 24*  CREATININE 1.21 1.13  CALCIUM 7.5* 7.3*   Liver Function Tests: Recent Labs  Lab 08/24/19 2037  AST 35  ALT 15  ALKPHOS 65  BILITOT 1.0  PROT 5.7*  ALBUMIN 2.2*   CBC: Recent Labs  Lab 08/24/19 2037 08/25/19 0130  WBC 17.3* 14.6*  NEUTROABS 15.0*  --   HGB 9.1* 9.0*  HCT 29.3* 29.1*  MCV 92.7 94.2  PLT 246 207   BNP (last 3 results) Recent Labs    08/24/19 2037  BNP 728.7*      Recent Results (from the past 240 hour(s))  SARS Coronavirus 2 by RT PCR (hospital order, performed in [facility] hospital lab) Nasopharyngeal Nasopharyngeal Swab     Status: None   Collection Time: 08/24/19  8:53 PM   Specimen: Nasopharyngeal Swab  Result Value Ref Range Status   SARS Coronavirus 2 NEGATIVE NEGATIVE Final    Comment: (NOTE) SARS-CoV-2 target nucleic acids are NOT DETECTED.  The SARS-CoV-2 RNA is generally detectable in upper and lower respiratory specimens during the acute phase  of infection. The lowest concentration of SARS-CoV-2 viral copies this assay can detect is 250 copies / mL. A negative result does not preclude SARS-CoV-2 infection and should not be used as the sole basis for treatment or other patient management decisions.  A negative result may occur with improper specimen collection / handling, submission of specimen other than nasopharyngeal swab, presence of viral mutation(s) within the areas targeted by this assay, and inadequate number of viral copies (<250 copies / mL). A negative result must be combined with clinical observations, patient history, and epidemiological information.  Fact Sheet for Patients:   StrictlyIdeas.no  Fact Sheet for Healthcare Providers: BankingDealers.co.za  This test is not yet approved or   cleared by the [place] FDA and has been authorized for detection and/or diagnosis of SARS-CoV-2 by FDA under an Emergency Use Authorization (EUA).  This EUA will remain in effect (meaning this test can be used) for the duration of the COVID-19 declaration under Section 564(b)(1) of the Act, 21 U.S.C. section 360bbb-3(b)(1), unless the authorization is terminated or revoked sooner.  Performed at [facility], [address] [place], [place] [address]   Blood Culture (routine x 2)     Status: None (Preliminary result)   Collection Time: 08/24/19  9:00 PM   Specimen: BLOOD  Result Value Ref Range Status   Specimen Description BLOOD BLOOD LEFT FOREARM  Final   Special Requests   Final    BOTTLES DRAWN AEROBIC AND ANAEROBIC Blood Culture results may not be optimal due to an excessive volume of blood received in culture bottles   Culture   Final    NO GROWTH < 12 HOURS Performed at [facility], [address], [place],  [address]    Report Status PENDING  Incomplete  Blood Culture (routine x 2)     Status: None (Preliminary result)   Collection Time: 08/24/19  9:05 PM   Specimen: BLOOD  Result Value Ref Range Status   Specimen Description BLOOD BLOOD LEFT HAND  Final   Special Requests   Final    BOTTLES DRAWN AEROBIC AND ANAEROBIC Blood Culture adequate volume   Culture   Final    NO GROWTH < 12 HOURS Performed at [facility], [address] [place],  [address]    Report Status PENDING  Incomplete     Studies: DG Chest Port 1 View  Result Date: 08/24/2019 CLINICAL DATA:  Fever and tachycardia. EXAM: PORTABLE CHEST 1 VIEW COMPARISON:  August 09, 2019 FINDINGS: Mild, diffuse chronic appearing increased lung markings are seen. Mild right basilar infiltrate is present. There is no evidence of a pleural effusion or pneumothorax. The cardiac silhouette is mildly enlarged. Moderate severity calcification of the aortic arch is noted. The  visualized skeletal structures are unremarkable. IMPRESSION: 1. Chronic appearing increased lung markings with mild right basilar infiltrate. Electronically Signed   By: Virgina Norfolk M.D.   On: 08/24/2019 21:00    Scheduled Meds: . atorvastatin  20 mg Oral Daily  . enoxaparin (LOVENOX) injection  40 mg Subcutaneous Q24H  . pantoprazole  40 mg Oral Daily   Continuous Infusions: . sodium chloride 125 mL/hr at 08/25/19 0121  . amiodarone Stopped (08/25/19 1100)  . ceFEPime (MAXIPIME) IV Stopped (08/25/19 1142)  . lactated ringers 75 mL/hr at 08/25/19 1342  . metronidazole Stopped (08/25/19 1143)  . vancomycin      Assessment/Plan:  1. Sepsis with hypotension.  Received 3 L of fluid in the emergency room.  I started maintenance fluids when I saw him today and blood pressure now is finally come up.  Right lower lobe pneumonia.  Patient on aggressive antibiotics with vancomycin and Maxipime.  I will get rid of vancomycin and add atypical coverage.  Swallow eval with difficulty swallowing. 2. Wide-complex tachycardia they ordered amiodarone drip but I do not think it was hung.  Awaiting cardiology opinion on this.  Heart rate better after fluid boluses. 3. Hyperlipidemia on atorvastatin 4. Chronic kidney disease stage IIIb 5. Anemia.  We will send off iron studies.  Recent GI bleed 6. Smudge cells seen on prior CBC could be underlying CLL 7. Recent esophageal ulcer and esophagitis on Protonix    Code Status:     Code Status Orders  (From admission, onward)         Start     Ordered   08/25/19 0015  Full code  Continuous        08/25/19 0014        Code Status History    Date Active Date Inactive Code Status Order ID Comments User Context   08/09/2019 2026 08/14/2019 2249 Full Code [id number]  Bradly Bienenstock, NP ED   08/04/2019 1827 08/08/2019 2341 Full Code [id number]  Georgette Shell, MD ED   08/04/2019 0533 08/04/2019 1826 Full Code [id number]  Vianne Bulls, MD ED    11/23/2018 2241 12/01/2018 1601 Full Code [id number]  Phillips Grout, MD Inpatient   11/22/2018 1732 11/23/2018 2212 Full Code [id number]  Para Skeans, MD ED   Advance Care Planning Activity    Advance Directive Documentation     Most Recent Value  Type of Advance Directive Healthcare Power of Attorney  Pre-existing out of facility DNR order (yellow form or pink MOST form) --  "MOST" Form in Place? --     Disposition Plan: Status is: Inpatient   Dispo: The patient is from: home              Anticipated d/c is to: home              Anticipated d/c date is: We will need a few days of IV antibiotics              Patient currently being treated for sepsis with hypotension and pneumonia  Consultants:  cardiology  Antibiotics: -Cefepime, doxycycline  Time spent: 27 minutes  [place]

## 2019-08-26 ENCOUNTER — Inpatient Hospital Stay: Payer: Medicare Other

## 2019-08-26 DIAGNOSIS — I480 Paroxysmal atrial fibrillation: Secondary | ICD-10-CM

## 2019-08-26 DIAGNOSIS — R1310 Dysphagia, unspecified: Secondary | ICD-10-CM

## 2019-08-26 DIAGNOSIS — J690 Pneumonitis due to inhalation of food and vomit: Secondary | ICD-10-CM

## 2019-08-26 DIAGNOSIS — K2210 Ulcer of esophagus without bleeding: Secondary | ICD-10-CM

## 2019-08-26 DIAGNOSIS — A0472 Enterocolitis due to Clostridium difficile, not specified as recurrent: Secondary | ICD-10-CM

## 2019-08-26 DIAGNOSIS — D508 Other iron deficiency anemias: Secondary | ICD-10-CM

## 2019-08-26 LAB — BASIC METABOLIC PANEL
Anion gap: 10 (ref 5–15)
BUN: 26 mg/dL — ABNORMAL HIGH (ref 8–23)
CO2: 20 mmol/L — ABNORMAL LOW (ref 22–32)
Calcium: 7.4 mg/dL — ABNORMAL LOW (ref 8.9–10.3)
Chloride: 111 mmol/L (ref 98–111)
Creatinine, Ser: 1.16 mg/dL (ref 0.61–1.24)
GFR calc Af Amer: >60 mL/min (ref >60)
GFR calc non Af Amer: 58 mL/min — ABNORMAL LOW (ref >60)
Glucose, Bld: 83 mg/dL (ref 70–99)
Potassium: 3.8 mmol/L (ref 3.5–5.1)
Sodium: 141 mmol/L (ref 135–145)

## 2019-08-26 LAB — CBC
HCT: 26.1 % — ABNORMAL LOW (ref 39.0–52.0)
Hemoglobin: 8.2 g/dL — ABNORMAL LOW (ref 13.0–17.0)
MCH: 28.8 pg (ref 26.0–34.0)
MCHC: 31.4 g/dL (ref 30.0–36.0)
MCV: 91.6 fL (ref 80.0–100.0)
Platelets: 176 10*3/uL (ref 150–400)
RBC: 2.85 MIL/uL — ABNORMAL LOW (ref 4.22–5.81)
RDW: 17.2 % — ABNORMAL HIGH (ref 11.5–15.5)
WBC: 9.8 10*3/uL (ref 4.0–10.5)
nRBC: 0 % (ref 0.0–0.2)

## 2019-08-26 LAB — URINE CULTURE: Culture: NO GROWTH

## 2019-08-26 LAB — GLUCOSE, CAPILLARY
Glucose-Capillary: 69 mg/dL — ABNORMAL LOW (ref 70–99)
Glucose-Capillary: 77 mg/dL (ref 70–99)
Glucose-Capillary: 88 mg/dL (ref 70–99)

## 2019-08-26 LAB — C DIFFICILE QUICK SCREEN W PCR REFLEX
C Diff antigen: POSITIVE — AB
C Diff interpretation: DETECTED
C Diff toxin: POSITIVE — AB

## 2019-08-26 LAB — T4, FREE: Free T4: 1.99 ng/dL — ABNORMAL HIGH (ref 0.61–1.12)

## 2019-08-26 LAB — VITAMIN B12: Vitamin B-12: 300 pg/mL (ref 180–914)

## 2019-08-26 LAB — IRON AND TIBC
Iron: 10 ug/dL — ABNORMAL LOW (ref 45–182)
Saturation Ratios: 9 % — ABNORMAL LOW (ref 17.9–39.5)
TIBC: 116 ug/dL — ABNORMAL LOW (ref 250–450)
UIBC: 106 ug/dL

## 2019-08-26 LAB — MAGNESIUM: Magnesium: 1.4 mg/dL — ABNORMAL LOW (ref 1.7–2.4)

## 2019-08-26 LAB — TSH: TSH: 1.507 u[IU]/mL (ref 0.350–4.500)

## 2019-08-26 MED ORDER — PANTOPRAZOLE SODIUM 40 MG IV SOLR
40.0000 mg | Freq: Two times a day (BID) | INTRAVENOUS | Status: DC
  Administered 2019-08-26 – 2019-09-04 (×18): 40 mg via INTRAVENOUS
  Filled 2019-08-26 (×18): qty 40

## 2019-08-26 MED ORDER — KCL IN DEXTROSE-NACL 20-5-0.9 MEQ/L-%-% IV SOLN
INTRAVENOUS | Status: DC
  Filled 2019-08-26 (×5): qty 1000

## 2019-08-26 MED ORDER — SODIUM CHLORIDE 0.9 % IV SOLN
INTRAVENOUS | Status: DC | PRN
  Administered 2019-08-26 (×2): 250 mL via INTRAVENOUS
  Administered 2019-08-26 – 2019-08-28 (×4): 10 mL via INTRAVENOUS
  Administered 2019-08-28 – 2019-08-29 (×2): 250 mL via INTRAVENOUS

## 2019-08-26 MED ORDER — SODIUM CHLORIDE 0.9 % IV SOLN: INTRAVENOUS | Status: DC

## 2019-08-26 MED ORDER — VANCOMYCIN 50 MG/ML ORAL SOLUTION
125.0000 mg | Freq: Four times a day (QID) | ORAL | Status: DC
  Administered 2019-08-26 – 2019-08-28 (×9): 125 mg via ORAL
  Filled 2019-08-26 (×11): qty 2.5

## 2019-08-26 NOTE — Progress Notes (Addendum)
Update 2200: Pt was admitted in the floor with no signs of distress. Pt alert x 4. VSS except BP at 105/56. Pt was educated about safety and ascom within pt reach. Will continue to monitor.  Pt have two fluid order a sodium chloride running at 125 ml/hr and LR at 75 ml/hr, LR running at this time. Notify NP Ouma. Will continue to monitor.  Update 0015: NP Ouma ordered to discontinue 0.9% sodium chloride. Will continue to monitor.

## 2019-08-26 NOTE — Evaluation (Signed)
Physical Therapy Evaluation Patient Details Name: Albert [name] MRN: [id number] DOB: 07-05-1933 Today's Date: 08/26/2019   History of Present Illness  Albert [name] is 86 y.o. male with PMH of CKD, HTN, HLD with recent admission for GI bleed and urosepsis presents from SNF with concerns for fever and hypotensive. Admitted for sepsis and aspiration pneunonia. Also with C. diff. Wide-complex tachycardia secondary to sepsis. Several recent hospitalizations.    Clinical Impression  Patient alert and oriented to person, place, time, and mostly situation. Patient reports he was completing rehab at a SNF prior to this hospital admission and he would like to return to complete his rehab prior to going home. He reports he lives alone in a house with 1 step to enter and family/friends intermittently assist him with whatever he needs. He states he used to ambulate outside with a [facility] and drove prior to his repeated hospital admissions and GI difficulties. He did have help with cooking, clothes washing and bedclothes changing more recently. Upon PT evaluation, patient required total A +2 for rolling in bed and going sit to supine and max A +1 for attempt to go supine to sit which was partially successful. Unable to scoot to edge of bed or maintain trunk upright posture in sitting. Unable to flex knees despite cuing and physical assistance. Appeared to be involuntarily contracting quads during attempts to flex. Patient appears to have declined in functional independence and strength and would benefit from short term rehab prior to returning home. Patient would benefit from skilled physical therapy to address impairments and functional limitations (see PT Problem List below) to work towards stated goals and return to PLOF or maximal functional independence.       Follow Up Recommendations SNF    Equipment Recommendations  None recommended by PT    Recommendations for Other Services OT consult      Precautions / Restrictions Precautions Precautions: Fall;Other (comment) (C. Diff) Restrictions Weight Bearing Restrictions: No      Mobility  Bed Mobility Overal bed mobility: Needs Assistance Bed Mobility: Rolling;Sit to Sidelying;Sidelying to Sit Rolling: Total assist;+2 for physical assistance   Supine to sit: Max assist Sit to supine: +2 for physical assistance;Max assist   General bed mobility comments: Patient with minimal contribution to efforts to sit up at edge of bed. Unable to stay seated or get to edge of bed where he could bend knees. Very hot to touch and uncomfortable so returned to bed. Required total A + 2 to roll pt both directions to re-arrange chucks and draw sheet under patient and scoot him up in bed. Assisted by NT. Patient did attempt to help with supine <> sit transfer but was very weak. Very slumped and required mod A at back of trunk to maintain seated position for about 60 seconds. HR climed to 120 bpm during supine to sit attempt.  Transfers                 General transfer comment: Unable to attempt transfer from bed after unsuccessful attempt at sitting on side of bed.  Ambulation/Gait                Stairs            Wheelchair Mobility    Modified Rankin (Stroke Patients Only)       Balance Overall balance assessment: Needs assistance Sitting-balance support: Feet unsupported;Bilateral upper extremity supported Sitting balance-Leahy Scale: Zero Sitting balance - Comments: patient requires heavy assist at trunk  to remain sitting. Unable to scoot to edge of bed and touch floor with feet     Standing balance-Leahy Scale: Zero Standing balance comment: unable to stand                             Pertinent Vitals/Pain Pain Assessment: 0-10 Pain Score: 5  Pain Location: left knee. pt unconcerned and states it is chronic Pain Intervention(s): Limited activity within patient's tolerance;Monitored during  session;Repositioned    Home Living Family/patient expects to be discharged to:: Skilled nursing facility Living Arrangements: Alone Available Help at Discharge: Family;Available PRN/intermittently;Friend(s) Type of Home: House Home Access: Stairs to enter Entrance Stairs-Rails: Psychiatric nurse of Steps: 1 (3 inch) step up Home Layout: Able to live on main level with bedroom/bathroom (2nd floor bonus room upstairs (does not need to access)) Home Equipment: Walker - 2 wheels;Cane - single point;Shower seat - built in;Grab bars - tub/shower;Grab bars - toilet;Bedside commode;Walker - 4 wheels;Hand held shower head Additional Comments: states he no longer has the lift chair    Prior Function Level of Independence: Needs assistance   Gait / Transfers Assistance Needed: Per past documentation pt ambulates with SPC vs rollator vs 2WW depending on pt's knee pain. Pateint reports he used to ambulate outside with [facility] prior to getting sick with GI illnesses.  ADL's / Homemaking Assistance Needed: Patient reports he used to drive and be able to do everything for himself until recent GI illness. He states he has freinds and family that helps him with whatevr he needs now. He speaks of a caregiver but with further questionning states various family and freinds help him whenever he needs it. Prior documentation states: 3x's per week pt's cousin comes to assist as needed (with groceries, MD appts, etc); pt reports needing significantly more assist for bathing, dressing, and all IADL Tasks the last few months        Hand Dominance   Dominant Hand: Right    Extremity/Trunk Assessment   Upper Extremity Assessment Upper Extremity Assessment: Generalized weakness (able to use arms to reach remote control and spit bag. minimal contribution from arms when attempting to sit)    Lower Extremity Assessment Lower Extremity Assessment: LLE deficits/detail;RLE deficits/detail RLE Deficits /  Details: Patient unable to flex either knee upon command or with physical pressure at knee and ankle to help bend leg to clear bottom of bed. Appears to be actively extending against therapist involuntarily. Unable to lift legs off bed in supine. able to move R LE into hip abduction AAROM. LLE Deficits / Details: Patient unable to flex either knee upon command or with physical pressure at knee and ankle to help bend leg to clear bottom of bed. Appears to be actively extending against therapist involuntarily. Unable to lift legs off bed in supine.    Cervical / Trunk Assessment Cervical / Trunk Assessment: Kyphotic  Communication   Communication: No difficulties  Cognition Arousal/Alertness: Awake/alert Behavior During Therapy: WFL for tasks assessed/performed Overall Cognitive Status: Within Functional Limits for tasks assessed                                 General Comments: alert and oriented to self, place, time, and at least partially to situation. Seemed to think he has been having problems for about 3 weeks. Efforts at mobility suggest difficulty longer with signs such as inability to  flex knees despite cuing and physical force placed on them to bend.      General Comments General comments (skin integrity, edema, etc.): patient very hot to touch, bandages at bilateral heels    Exercises Other Exercises Other Exercises: assisted with re-arranging pt's chuck and draw sheet and bedclothes for comfort. Educated pt on role of PT in acute care setting.   Assessment/Plan    PT Assessment Patient needs continued PT services  PT Problem List Decreased strength;Decreased range of motion;Decreased activity tolerance;Decreased balance;Decreased mobility;Decreased knowledge of use of DME;Decreased cognition;Decreased coordination;Pain;Decreased knowledge of precautions;Decreased safety awareness;Decreased skin integrity       PT Treatment Interventions DME instruction;Gait  training;Stair training;Functional mobility training;Therapeutic activities;Therapeutic exercise;Balance training;Patient/family education    PT Goals (Current goals can be found in the Care Plan section)  Acute Rehab PT Goals Patient Stated Goal: go back to rehab to get stronger before returning home PT Goal Formulation: With patient Time For Goal Achievement: 09/09/19 Potential to Achieve Goals: Fair    Frequency Min 2X/week   Barriers to discharge Decreased caregiver support;Inaccessible home environment patient dependent for care and mobility at this point    Co-evaluation               AM-PAC PT "6 Clicks" Mobility  Outcome Measure Help needed turning from your back to your side while in a flat bed without using bedrails?: Total Help needed moving from lying on your back to sitting on the side of a flat bed without using bedrails?: A Lot Help needed moving to and from a bed to a chair (including a wheelchair)?: Total Help needed standing up from a chair using your arms (e.g., wheelchair or bedside chair)?: Total Help needed to walk in hospital room?: Total Help needed climbing 3-5 steps with a railing? : Total 6 Click Score: 7    End of Session   Activity Tolerance: Patient limited by fatigue;Patient limited by pain Patient left: with call bell/phone within reach;with bed alarm set;in bed Nurse Communication: Mobility status PT Visit Diagnosis: Other abnormalities of gait and mobility (R26.89);Muscle weakness (generalized) (M62.81);Difficulty in walking, not elsewhere classified (R26.2);Pain Pain - Right/Left: Right Pain - part of body: Knee    Time: 1550-1630 PT Time Calculation (min) (ACUTE ONLY): 40 min   Charges:   PT Evaluation $PT Eval Moderate Complexity: 1 Mod PT Treatments $Therapeutic Activity: 8-22 mins       Everlean Alstrom. Graylon Good, PT, DPT 08/26/19, 4:59 PM

## 2019-08-26 NOTE — Plan of Care (Signed)
  Problem: Education: Goal: Knowledge of General Education information will improve Description: Including pain rating scale, medication(s)/side effects and non-pharmacologic comfort measures Outcome: Progressing   Problem: Clinical Measurements: Goal: Will remain free from infection Outcome: Progressing   Problem: Safety: Goal: Ability to remain free from injury will improve Outcome: Progressing   

## 2019-08-26 NOTE — Progress Notes (Addendum)
Progress Note  Patient Name: Albert [name] Date of Encounter: 08/26/2019  Primary Cardiologist: [facility], Dr. Fletcher Anon  Subjective   No current chest pain, racing heart rate, or palpitations.  He has experienced racing heart rate in the past, however.  He reports new SOB and orthopnea since admission and s/p IVF.   He denies experiencing orthopnea or SOB before this admission.   He also denies previous lower extremity edema before this admission, noted on exam.  Recent lab shows + Cdiff.   Inpatient Medications    Scheduled Meds: . enoxaparin (LOVENOX) injection  40 mg Subcutaneous Q24H  . pantoprazole (PROTONIX) IV  40 mg Intravenous Q12H  . vancomycin  125 mg Oral QID   Continuous Infusions: . sodium chloride Stopped (08/26/19 0700)  . sodium chloride 40 mL/hr at 08/26/19 1309  . ampicillin-sulbactam (UNASYN) IV 3 g (08/26/19 1205)   PRN Meds: sodium chloride, acetaminophen **OR** acetaminophen   Vital Signs    Vitals:   08/26/19 0600 08/26/19 0617 08/26/19 0750 08/26/19 1216  BP: 138/69 (!) 109/58 112/62 (!) 118/49  Pulse: 89 (!) 102 97 97  Resp: 20 18 18 17   Temp: 99.4 F (37.4 C) 97.8 F (36.6 C) 97.6 F (36.4 C) 98.2 F (36.8 C)  TempSrc: Oral Oral Oral Oral  SpO2: 100% 98% 96% 100%  Weight:      Height:        Intake/Output Summary (Last 24 hours) at 08/26/2019 1347 Last data filed at 08/26/2019 0700 Gross per 24 hour  Intake 969.69 ml  Output 200 ml  Net 769.69 ml   Last 3 Weights 08/24/2019 08/14/2019 08/13/2019  Weight (lbs) 153 lb 14.1 oz 153 lb 14.1 oz 151 lb 7.3 oz  Weight (kg) 69.8 kg 69.8 kg 68.7 kg      Telemetry    Sinus tachycardia, atrial fibrillation, and atrial fibrillation with aberrancy noted this admission.   Currently ST with rates in the low 100s - Personally Reviewed  ECG    No new tracings- Personally Reviewed  Physical Exam   GEN: No acute distress.  Eating lunch and watching television. Neck: +JVD Cardiac: regular  rhythm but tachycardic, no murmurs, rubs, or gallops.  Respiratory:  Bilateral crackles.  Of note, pulmonary exam is limited by the patient's tenderness of skin likely due to his full body and scaling rash, as well as has limited mobility. GI: Soft, nontender, non-distended  MS:  Moderate nonpitting bilateral edema; No deformity.  He continues to exhibit a full body rash that is dry and flaking. Neuro:  Nonfocal  Psych: Normal affect   Labs    High Sensitivity Troponin:   Recent Labs  Lab 08/09/19 1727 08/09/19 1911 08/24/19 2037 08/24/19 2238  TROPONINIHS 43* 32* 97* 93*      Chemistry Recent Labs  Lab 08/24/19 2037 08/25/19 0130  NA 140 139  K 4.5 4.5  CL 109 111  CO2 21* 20*  GLUCOSE 111* 94  BUN 24* 24*  CREATININE 1.21 1.13  CALCIUM 7.5* 7.3*  PROT 5.7*  --   ALBUMIN 2.2*  --   AST 35  --   ALT 15  --   ALKPHOS 65  --   BILITOT 1.0  --   GFRNONAA 55* 59*  GFRAA >60 >60  ANIONGAP 10 8     Hematology Recent Labs  Lab 08/24/19 2037 08/25/19 0130 08/26/19 0539  WBC 17.3* 14.6* 9.8  RBC 3.16* 3.09* 2.85*  HGB 9.1* 9.0* 8.2*  HCT 29.3* 29.1* 26.1*  MCV 92.7 94.2 91.6  MCH 28.8 29.1 28.8  MCHC 31.1 30.9 31.4  RDW 17.3* 17.4* 17.2*  PLT 246 207 176    BNP Recent Labs  Lab 08/24/19 2037  BNP 728.7*     DDimer No results for input(s): DDIMER in the last 168 hours.   Radiology    DG Chest Port 1 View  Result Date: 08/24/2019 CLINICAL DATA:  Fever and tachycardia. EXAM: PORTABLE CHEST 1 VIEW COMPARISON:  August 09, 2019 FINDINGS: Mild, diffuse chronic appearing increased lung markings are seen. Mild right basilar infiltrate is present. There is no evidence of a pleural effusion or pneumothorax. The cardiac silhouette is mildly enlarged. Moderate severity calcification of the aortic arch is noted. The visualized skeletal structures are unremarkable. IMPRESSION: 1. Chronic appearing increased lung markings with mild right basilar infiltrate.  Electronically Signed   By: Virgina Norfolk M.D.   On: 08/24/2019 21:00   DG ESOPHAGUS W SINGLE CM (SOL OR THIN BA)  Result Date: 08/26/2019 CLINICAL DATA:  Dysphagia EXAM: ESOPHOGRAM/BARIUM SWALLOW TECHNIQUE: Single contrast examination was performed using  thin barium. FLUOROSCOPY TIME:  Fluoroscopy Time:  48 seconds Radiation Exposure Index (if provided by the fluoroscopic device): 14 COMPARISON:  None. FINDINGS: The patient is unable to stand. Single-contrast esophagram attempted with the patient approximately 40 degrees upright. With 2 small swallows, there was immediate aspiration into the tracheobronchial tree. Small hiatal hernia also noted. At this point the procedure was stopped. Images obtained for documentation. IMPRESSION: Positive exam for immediate aspiration into the tracheobronchial tree. Small hiatal hernia These results were called by telephone at the time of interpretation on 08/26/2019 at 1:00pm to provider Loletha Grayer , who verbally acknowledged these results. Electronically Signed   By: Jerilynn Mages.  Shick M.D.   On: 08/26/2019 13:20    Cardiac Studies   Echo 08/06/19 1. Left ventricular ejection fraction, by estimation, is 60 to 65%. The  left ventricle has normal function. The left ventricle has no regional  wall motion abnormalities. There is mild left ventricular hypertrophy.  Left ventricular diastolic parameters  are consistent with Grade I diastolic dysfunction (impaired relaxation).  2. Right ventricular systolic function is normal. The right ventricular  size is moderately enlarged.  3. Left atrial size was severely dilated.  4. Right atrial size was mildly dilated.  5. The mitral valve is normal in structure. Mild mitral valve  regurgitation. No evidence of mitral stenosis.  6. The aortic valve has an indeterminant number of cusps. Aortic valve  regurgitation is mild. Mild to moderate aortic valve stenosis.   Patient Profile     86 y.o. male with a hx of  hypertension, hyperlipidemia, CKD, history of COVID-19 infection winter 2020, h/o CA, h/o recent admission with GI bleed, esophageal and duodenal ulcers, urosepsis present from SNF, and who is being seen today for the evaluation of abnormal EKG and elevated rates in the setting of sepsis with recent lab results showing Cdiff.   Assessment & Plan    Newly diagnosed paroxysmal atrial fibrillation with RVR / with aberrancy --No current racing heart rate or palpitations, though he has noted these in the past.  No previously known history of arrhythmia, though it was noted at a previous admission when seen on telemetry.   --PAF likely triggered with rates exacerbated by current infection / HCAP /C. Difficile/ sepsis with anemia.   --Suspect that rates will continue to improve with recovery from his current infection / illness. Also suspect  improvement of rates with treatment of abnormal TSH as below. --Telemetry significant for sinus tachycardia, atrial fibrillation, and atrial fibrillation with aberrancy. Currently sinus tachycardia and rates well controlled.  --If further atrial fibrillation with elevated rates, recommend IV amiodarone 150mg  with transition to oral amiodarone 400 mg daily if needed. Will hold off on starting on amiodarone given currently NSR-ST at this time. Given the echo as above with severe left atrial dilation, he is at risk for recurrent arrhythmia. --BB not ideal at this time given recent soft pressures/ hypotension. Could consider once room in pressure/recovery from current infection. --Recommend against further IVF given he is slightly volume overloaded on exam today. --Continue abx to treat his infection.  --Monitor electrolytes.  K goal 4.0 and Mg goal 2.0.   --CHA2DS2VASc score of at least 4 (CHF, HTN, agex2) with recommendation for anticoagulation given atrial fibrillation; however, current risks of anticoagulation outweigh those of benefits given his anemia with recent  history of GI bleed and admissions for GI bleed in July and August 2021.  No plan to anticoagulate patient at this time, given the risk of bleeding.  Reassess anticoagulation at follow-up.  He will need to establish in our office. Recommend Zio monitor either at discharge or once recovered from illness for additional monitoring and to assess burden of atrial fibrillation.   Elevated HS Tn --No CP.  High-sensitivity troponin minimally elevated and flat trending. Likely supply demand ischemia in the setting of sepsis with hypoalbuminemia and recent GIB.  8/1 echo as above with normal EF and no regional wall motion abnormalities. Not an ideal candidate for further ischemic workup given his illness at this time. No current plan for emergent ischemic work-up/noninvasive ischemic work-up at this time.  Continue current medications. Could consider low-dose beta-blocker once he has recovered from his current infection and there is room in BP.  Diastolic dysfunction --Reports current shortness of breath and orthopnea.  He is volume up on exam with bilateral crackles and moderate bilateral edema.  Previous 8/1 echo as above shows normal EF and diastolic dysfunction.  Recommend against further fluids at this time given he is slightly volume overloaded on exam. Given his current infection/sepsis and low BP, would defer starting a diuretic. No indication for repeat echo at this time.   Abnormal TSH /hyperthyroidism --TSH 0.126 on 11/22/2018.  Will recheck TSH/FT4.  Tx of abnormal TSH per IM.  Anemia with history of GI bleed --As above, recommend against anticoagulation at this time given his severe anemia and as he is at risk for bleeding.  Reassess at follow-up.  Continue to monitor with daily CBC and recommend transfusion for hemoglobin below 8.  For questions or updates, please contact [place] Please consult [email or web address] for contact info under        Signed, Arvil Chaco, PA-C  08/26/2019,  1:48 PM

## 2019-08-26 NOTE — Progress Notes (Signed)
Notice that pt arms is more swollen than yesterday. Pt have LR running at 75 ml/hr. Notify NP Ouma and states to stop LR at this time and notify incoming shift. Will notfiy Shift. Will continue to monitor.

## 2019-08-26 NOTE — Evaluation (Signed)
Clinical/Bedside Swallow Evaluation Patient Details  Name: Albert [name] MRN: [id number] Date of Birth: [birth year]  Today's Date: 08/26/2019 Time: SLP Start Time (ACUTE ONLY): 1011 SLP Stop Time (ACUTE ONLY): 1032 SLP Time Calculation (min) (ACUTE ONLY): 21 min  Past Medical History:  Past Medical History:  Diagnosis Date  . Actinic keratosis 04/23/2008   L elbow distal tricep (bx proven)  . Basal cell carcinoma 06/17/2006   L preauricular   . Basal cell carcinoma 05/26/2012   L ant neck   . Basal cell carcinoma 11/02/2012   Glabella - excision   . Basal cell carcinoma 08/15/2014   L med ankle   . Basal cell carcinoma 02/23/2017   R lat infrapectoral - excision  . Basal cell carcinoma 12/08/2017   L mid to distal lat volar forearm   . Hypertension   . Squamous cell carcinoma of skin 02/14/2014   R lat low back - ED&C  . Squamous cell carcinoma of skin 10/02/2014   R prox lat bicep - ED&C   Past Surgical History:  Past Surgical History:  Procedure Laterality Date  . COLONOSCOPY N/A 08/05/2019   Procedure: COLONOSCOPY;  Surgeon: Toledo, Benay Pike, MD;  Location: [facility] ENDOSCOPY;  Service: Gastroenterology;  Laterality: N/A;  . ESOPHAGOGASTRODUODENOSCOPY N/A 08/05/2019   Procedure: ESOPHAGOGASTRODUODENOSCOPY (EGD);  Surgeon: Toledo, Benay Pike, MD;  Location: [facility] ENDOSCOPY;  Service: Gastroenterology;  Laterality: N/A;   HPI:  Albert [name] is a 86 y.o. male with PMH as noted below presents from his facility with fever and low blood pressure today.  Previously he had been admitted for upper GI bleed, EGD performed and pt diagnosed with esophageal and duodenal ulcers at the end of last month.  Chest x-ray indicates chronic appearing increased lung markings with mild right basilar infiltrate. ST consulted as pt reports difficulty swallowing.    Assessment / Plan / Recommendation Clinical Impression  At bedside pt presents with grossly adequate oropharyngeal abilities. However he  grimaces and appears to swallow multiple times "to get it to go down" (food/liquids). With every swallow pt belches and has globus sensation that he describes as inability of liquids or food (puree and soft solids) "to go down" pointing down his esophagus. At this time, pt is requesting a full liquid diet because "it is easier to get broth, ice cream, jello to go down."  Per chart, pt has not had an esophagram. At this time, recommend esophagram to assess for possible esophageal dysmotility. ST will follow.   1350 - esophagram completed with documented aspiration, recommend NPO with follow Modified Barium Swallow Study to be completed 08/28/2019  SLP Visit Diagnosis: Dysphagia, unspecified (R13.10)    Aspiration Risk  Mild aspiration risk;Risk for inadequate nutrition/hydration    Diet Recommendation     Medication Administration: Crushed with puree    Other  Recommendations Recommended Consults: Consider esophageal assessment Oral Care Recommendations: Oral care BID   Follow up Recommendations  (TBD)      Frequency and Duration min 2x/week  2 weeks       Prognosis Prognosis for Safe Diet Advancement: Fair Barriers to Reach Goals: Severity of deficits      Swallow Study   General Date of Onset: 08/25/19 HPI: Albert [name] is a 86 y.o. male with PMH as noted below presents from his facility with fever and low blood pressure today.  Previously he had been admitted for upper GI bleed, EGD performed and pt diagnosed with esophageal and duodenal ulcers at the  end of last month.  Chest x-ray indicates chronic appearing increased lung markings with mild right basilar infiltrate. ST consulted as pt reports difficulty swallowing.  Type of Study: Bedside Swallow Evaluation Previous Swallow Assessment: none in chart Diet Prior to this Study: Regular;Thin liquids Temperature Spikes Noted: No Respiratory Status: Room air History of Recent Intubation: No Behavior/Cognition:  Alert;Cooperative;Pleasant mood Oral Cavity Assessment: Within Functional Limits Oral Care Completed by SLP: No Oral Cavity - Dentition: Adequate natural dentition Vision: Functional for self-feeding Self-Feeding Abilities: Able to feed self Patient Positioning: Upright in bed Baseline Vocal Quality: Normal Volitional Cough: Strong Volitional Swallow: Able to elicit    Oral/Motor/Sensory Function Overall Oral Motor/Sensory Function: Within functional limits   Ice Chips Ice chips: Not tested   Thin Liquid Thin Liquid:  (see impressions statement) Presentation: Straw;Cup;Self Fed    Nectar Thick Nectar Thick Liquid: Not tested   Honey Thick Honey Thick Liquid: Not tested   Puree Puree:  (see impressions statement) Presentation: Self Fed;Spoon   Solid     Solid:  (see impressions statement) Presentation: Self Fed     Kortnee Bas B. Rutherford Nail M.S., [place], [place] Office [phone]  Reis Goga 08/26/2019,1:50 PM

## 2019-08-26 NOTE — Progress Notes (Signed)
Patient ID: Albert [name], male   DOB: 1933/10/07, 86 y.o.   MRN: [id number] [facility] PROGRESS NOTE  NIKHOLAS GEFFRE YYT:[id number] DOB: 1933-10-15 DOA: 08/24/2019 PCP: [name], MD  HPI/Subjective: Patient was admitted with aspiration pneumonia.  Had some diarrhea last night.  When I was in the room I opened up his milk and he choked a little bit on the milk.  Patient was feeling better than when he came in.  Objective: Vitals:   08/26/19 0750 08/26/19 1216  BP: 112/62 (!) 118/49  Pulse: 97 97  Resp: 18 17  Temp: 97.6 F (36.4 C) 98.2 F (36.8 C)  SpO2: 96% 100%    Intake/Output Summary (Last 24 hours) at 08/26/2019 1357 Last data filed at 08/26/2019 0700 Gross per 24 hour  Intake 969.69 ml  Output 200 ml  Net 769.69 ml   Filed Weights   08/24/19 2053  Weight: 69.8 kg    ROS: Review of Systems  Respiratory: Positive for cough. Negative for shortness of breath.   Cardiovascular: Negative for chest pain.  Gastrointestinal: Positive for diarrhea, nausea and vomiting. Negative for abdominal pain.   Exam: Physical Exam HENT:     Nose: No mucosal edema.     Mouth/Throat:     Pharynx: No oropharyngeal exudate.  Eyes:     General: Lids are normal.     Conjunctiva/sclera: Conjunctivae normal.     Pupils: Pupils are equal, round, and reactive to light.  Cardiovascular:     Rate and Rhythm: Normal rate and regular rhythm.     Heart sounds: Normal heart sounds, S1 normal and S2 normal.  Pulmonary:     Breath sounds: Examination of the right-lower field reveals decreased breath sounds. Examination of the left-lower field reveals decreased breath sounds. Decreased breath sounds present. No wheezing, rhonchi or rales.  Abdominal:     Palpations: Abdomen is soft.     Tenderness: There is no abdominal tenderness.  Musculoskeletal:     Right lower leg: No swelling.     Left lower leg: No swelling.  Skin:    General: Skin is warm.     Findings: No rash.   Neurological:     Mental Status: He is alert.     Comments: Answers some yes or no questions.       Data Reviewed: Basic Metabolic Panel: Recent Labs  Lab 08/24/19 2037 08/25/19 0130  NA 140 139  K 4.5 4.5  CL 109 111  CO2 21* 20*  GLUCOSE 111* 94  BUN 24* 24*  CREATININE 1.21 1.13  CALCIUM 7.5* 7.3*   Liver Function Tests: Recent Labs  Lab 08/24/19 2037  AST 35  ALT 15  ALKPHOS 65  BILITOT 1.0  PROT 5.7*  ALBUMIN 2.2*   CBC: Recent Labs  Lab 08/24/19 2037 08/25/19 0130 08/26/19 0539  WBC 17.3* 14.6* 9.8  NEUTROABS 15.0*  --   --   HGB 9.1* 9.0* 8.2*  HCT 29.3* 29.1* 26.1*  MCV 92.7 94.2 91.6  PLT 246 207 176   BNP (last 3 results) Recent Labs    08/24/19 2037  BNP 728.7*     CBG: Recent Labs  Lab 08/26/19 0057 08/26/19 0147 08/26/19 0610  GLUCAP 77 88 69*    Recent Results (from the past 240 hour(s))  Urine culture     Status: None   Collection Time: 08/24/19  8:37 PM   Specimen: In/Out Cath Urine  Result Value Ref Range Status   Specimen Description  Final    IN/OUT CATH URINE Performed at [facility], [address], [place], [place] [address]    Special Requests   Final    NONE Performed at [facility], [address] [place], [place] [address]    Culture   Final    NO GROWTH Performed at [facility], [place] [address], [place], [place] [address]    Report Status 08/26/2019 FINAL  Final  SARS Coronavirus 2 by RT PCR (hospital order, performed in [facility] hospital lab) Nasopharyngeal Nasopharyngeal Swab     Status: None   Collection Time: 08/24/19  8:53 PM   Specimen: Nasopharyngeal Swab  Result Value Ref Range Status   SARS Coronavirus 2 NEGATIVE NEGATIVE Final    Comment: (NOTE) SARS-CoV-2 target nucleic acids are NOT DETECTED.  The SARS-CoV-2 RNA is generally detectable in upper and lower respiratory specimens during the acute phase of infection. The lowest concentration of SARS-CoV-2  viral copies this assay can detect is 250 copies / mL. A negative result does not preclude SARS-CoV-2 infection and should not be used as the sole basis for treatment or other patient management decisions.  A negative result may occur with improper specimen collection / handling, submission of specimen other than nasopharyngeal swab, presence of viral mutation(s) within the areas targeted by this assay, and inadequate number of viral copies (<250 copies / mL). A negative result must be combined with clinical observations, patient history, and epidemiological information.  Fact Sheet for Patients:   StrictlyIdeas.no  Fact Sheet for Healthcare Providers: BankingDealers.co.za  This test is not yet approved or  cleared by the [place] FDA and has been authorized for detection and/or diagnosis of SARS-CoV-2 by FDA under an Emergency Use Authorization (EUA).  This EUA will remain in effect (meaning this test can be used) for the duration of the COVID-19 declaration under Section 564(b)(1) of the Act, 21 U.S.C. section 360bbb-3(b)(1), unless the authorization is terminated or revoked sooner.  Performed at [facility], [address] [place], [place] [address]   Blood Culture (routine x 2)     Status: None (Preliminary result)   Collection Time: 08/24/19  9:00 PM   Specimen: BLOOD  Result Value Ref Range Status   Specimen Description BLOOD BLOOD LEFT FOREARM  Final   Special Requests   Final    BOTTLES DRAWN AEROBIC AND ANAEROBIC Blood Culture results may not be optimal due to an excessive volume of blood received in culture bottles   Culture   Final    NO GROWTH 2 DAYS Performed at [facility], [address] [place], [place] [address]    Report Status PENDING  Incomplete  Blood Culture (routine x 2)     Status: None (Preliminary result)   Collection Time: 08/24/19  9:05 PM   Specimen: BLOOD  Result Value  Ref Range Status   Specimen Description BLOOD BLOOD LEFT HAND  Final   Special Requests   Final    BOTTLES DRAWN AEROBIC AND ANAEROBIC Blood Culture adequate volume   Culture   Final    NO GROWTH 2 DAYS Performed at [facility], [address] [place], [place] [address]    Report Status PENDING  Incomplete  MRSA PCR Screening     Status: None   Collection Time: 08/25/19 10:33 PM   Specimen: Nasopharyngeal  Result Value Ref Range Status   MRSA by PCR NEGATIVE NEGATIVE Final    Comment:  The GeneXpert MRSA Assay (FDA approved for NASAL specimens only), is one component of a comprehensive MRSA colonization surveillance program. It is not intended to diagnose MRSA infection nor to guide or monitor treatment for MRSA infections. Performed at [facility], [place], [place] [address]   C Difficile Quick Screen w PCR reflex     Status: Abnormal   Collection Time: 08/26/19 10:22 AM   Specimen: STOOL  Result Value Ref Range Status   C Diff antigen POSITIVE (A) NEGATIVE Final   C Diff toxin POSITIVE (A) NEGATIVE Final   C Diff interpretation Toxin producing C. difficile detected.  Final    Comment: CRITICAL RESULT CALLED TO, READ BACK BY AND VERIFIED WITH: ANNIE RITCHIE AT 1128 08/26/19.PMF Performed at [facility], [address], [place], [place] [address]      Studies: [facility] Chest [place] 1 View  Result Date: 08/24/2019 CLINICAL DATA:  Fever and tachycardia. EXAM: PORTABLE CHEST 1 VIEW COMPARISON:  August 09, 2019 FINDINGS: Mild, diffuse chronic appearing increased lung markings are seen. Mild right basilar infiltrate is present. There is no evidence of a pleural effusion or pneumothorax. The cardiac silhouette is mildly enlarged. Moderate severity calcification of the aortic arch is noted. The visualized skeletal structures are unremarkable. IMPRESSION: 1. Chronic appearing increased lung markings with mild right basilar infiltrate.  Electronically Signed   By: Virgina Norfolk M.D.   On: 08/24/2019 21:00   DG ESOPHAGUS W SINGLE CM (SOL OR THIN BA)  Result Date: 08/26/2019 CLINICAL DATA:  Dysphagia EXAM: ESOPHOGRAM/BARIUM SWALLOW TECHNIQUE: Single contrast examination was performed using  thin barium. FLUOROSCOPY TIME:  Fluoroscopy Time:  48 seconds Radiation Exposure Index (if provided by the fluoroscopic device): 14 COMPARISON:  None. FINDINGS: The patient is unable to stand. Single-contrast esophagram attempted with the patient approximately 40 degrees upright. With 2 small swallows, there was immediate aspiration into the tracheobronchial tree. Small hiatal hernia also noted. At this point the procedure was stopped. Images obtained for documentation. IMPRESSION: Positive exam for immediate aspiration into the tracheobronchial tree. Small hiatal hernia These results were called by telephone at the time of interpretation on 08/26/2019 at 1:00pm to provider Loletha Grayer , who verbally acknowledged these results. Electronically Signed   By: Jerilynn Mages.  Shick M.D.   On: 08/26/2019 13:20    Scheduled Meds: . enoxaparin (LOVENOX) injection  40 mg Subcutaneous Q24H  . pantoprazole (PROTONIX) IV  40 mg Intravenous Q12H  . vancomycin  125 mg Oral QID   Continuous Infusions: . sodium chloride Stopped (08/26/19 0700)  . sodium chloride 40 mL/hr at 08/26/19 1309  . ampicillin-sulbactam (UNASYN) IV 3 g (08/26/19 1205)  . dextrose 5 % and 0.9 % NaCl with KCl 20 mEq/L      Assessment/Plan:  1. Sepsis with hypotension.  Blood pressure better today.  We will continue IV fluids with his n.p.o. status.  Right lower lobe pneumonia secondary to aspiration.  Switch antibiotics to Unasyn.  Will do a shorter course of therapy 5 days. 2. C. difficile colitis.  P.o. vancomycin hopefully will be able to swallow okay. 3. Dysphagia with aspiration with esophagram.  Speech therapy to do a modified barium swallow on Monday.  N.p.o. status sips with meds  for now. 4. Wide-complex tachycardia secondary to sepsis. 5. Hyperlipidemia.  Hold atorvastatin 6. Chronic kidney disease stage IIIa 7. Esophageal ulcer and esophagitis.  Switch Protonix to IV. 8. Iron deficiency anemia from prior blood loss. 9. Smudge cells seen on CBC  likely underlying CLL.     Code Status:     Code Status Orders  (From admission, onward)         Start     Ordered   08/25/19 0015  Full code  Continuous        08/25/19 0014        Code Status History    Date Active Date Inactive Code Status Order ID Comments User Context   08/09/2019 2026 08/14/2019 2249 Full Code [id number]  Bradly Bienenstock, NP ED   08/04/2019 1827 08/08/2019 2341 Full Code [id number]  Georgette Shell, MD ED   08/04/2019 0533 08/04/2019 1826 Full Code [id number]  Vianne Bulls, MD ED   11/23/2018 2241 12/01/2018 1601 Full Code [id number]  Phillips Grout, MD Inpatient   11/22/2018 1732 11/23/2018 2212 Full Code [id number]  Para Skeans, MD ED   Advance Care Planning Activity    Advance Directive Documentation     Most Recent Value  Type of Advance Directive Healthcare Power of Attorney  Pre-existing out of facility DNR order (yellow form or pink MOST form) --  "MOST" Form in Place? --     Family Communication: Spoke with [name] on the phone Disposition Plan: Status is: Inpatient  Dispo: The patient is from: Rehab              Anticipated d/c is to: Rehab              Anticipated d/c date is: Dependent on whether he does well or not with the swallow evaluation on Monday.              Patient currently being treated for aspiration pneumonia.  We will have to figure out whether he can swallow or not.  Also has C. difficile colitis.  Consultants:  Speech therapy  Antibiotics:  P.o. vancomycin  IV Unasyn  Time spent: 32 minutes, case discussed with radiology, speech therapy and nursing staff.  [employer] [place]  [facility] [employer]

## 2019-08-27 DIAGNOSIS — D509 Iron deficiency anemia, unspecified: Secondary | ICD-10-CM

## 2019-08-27 LAB — BASIC METABOLIC PANEL
Anion gap: 13 (ref 5–15)
BUN: 23 mg/dL (ref 8–23)
CO2: 18 mmol/L — ABNORMAL LOW (ref 22–32)
Calcium: 7.3 mg/dL — ABNORMAL LOW (ref 8.9–10.3)
Chloride: 112 mmol/L — ABNORMAL HIGH (ref 98–111)
Creatinine, Ser: 1.13 mg/dL (ref 0.61–1.24)
GFR calc Af Amer: >60 mL/min (ref >60)
GFR calc non Af Amer: 59 mL/min — ABNORMAL LOW (ref >60)
Glucose, Bld: 85 mg/dL (ref 70–99)
Potassium: 3.4 mmol/L — ABNORMAL LOW (ref 3.5–5.1)
Sodium: 143 mmol/L (ref 135–145)

## 2019-08-27 LAB — CBC
HCT: 26.4 % — ABNORMAL LOW (ref 39.0–52.0)
Hemoglobin: 8.4 g/dL — ABNORMAL LOW (ref 13.0–17.0)
MCH: 29.1 pg (ref 26.0–34.0)
MCHC: 31.8 g/dL (ref 30.0–36.0)
MCV: 91.3 fL (ref 80.0–100.0)
Platelets: 193 10*3/uL (ref 150–400)
RBC: 2.89 MIL/uL — ABNORMAL LOW (ref 4.22–5.81)
RDW: 17.2 % — ABNORMAL HIGH (ref 11.5–15.5)
WBC: 10 10*3/uL (ref 4.0–10.5)
nRBC: 0 % (ref 0.0–0.2)

## 2019-08-27 LAB — OCCULT BLOOD X 1 CARD TO LAB, STOOL
Fecal Occult Bld: NEGATIVE
Fecal Occult Bld: NEGATIVE

## 2019-08-27 NOTE — NC FL2 (Signed)
[place] LEVEL OF CARE SCREENING TOOL     IDENTIFICATION  Patient Name: Albert [name] Birthdate: 04-May-1933 Sex: male Admission Date (Current Location): 08/24/2019  [place] and [place] Number:  Engineering geologist and Address:  [facility], [address], [place], [place] [address]      Provider Number: [id number]  Attending Physician Name and Address:  Loletha Grayer, MD  Relative Name and Phone Number:  [name] [phone]    Current Level of Care: Hospital Recommended Level of Care: [place] Prior Approval Number:    Date Approved/Denied:   PASRR Number: [id number]  Discharge Plan: SNF    Current Diagnoses: Patient Active Problem List   Diagnosis Date Noted  . Aspiration pneumonia of right lower lobe due to gastric secretions ([place])   . Dysphagia   . C. difficile colitis   . Ulcer of esophagus without bleeding   . Lobar pneumonia ([place])   . Wide-complex tachycardia ([place])   . Sepsis ([place]) 08/24/2019  . Lactic acidosis 08/24/2019  . Anemia 08/24/2019  . Elevated brain natriuretic peptide (BNP) level 08/24/2019  . Protein-calorie malnutrition, severe 08/12/2019  . GIB (gastrointestinal bleeding) 08/10/2019  . GI bleed 08/09/2019  . Acute GI bleeding 08/04/2019  . Symptomatic anemia 08/04/2019  . Coagulopathy ([place]) 08/04/2019  . Metabolic acidosis 29/47/6546  . SIRS (systemic inflammatory response syndrome) ([place]) 08/04/2019  . UGI bleed 08/04/2019  . Dehydration 11/24/2018  . CKD (chronic kidney disease), stage III 11/24/2018  . Generalized weakness 11/22/2018  . Acute renal failure superimposed on stage 3a chronic kidney disease ([place])   . Elevated troponin   . Hyperlipidemia 06/15/2014  . Benign essential hypertension 06/15/2013    Orientation RESPIRATION BLADDER Height & Weight     Self, Time, Place, Situation  Normal Continent Weight: 69.5 kg Height:  5\' 4"  (162.6 cm)  BEHAVIORAL  SYMPTOMS/MOOD NEUROLOGICAL BOWEL NUTRITION STATUS      Continent Diet (Regular)  AMBULATORY STATUS COMMUNICATION OF NEEDS Skin   Limited Assist Verbally Normal                       Personal Care Assistance Level of Assistance  Dressing, Feeding, Bathing Bathing Assistance: Limited assistance Feeding assistance: Independent Dressing Assistance: Limited assistance     Functional Limitations Info  Sight, Hearing, Speech Sight Info: Adequate Hearing Info: Impaired Speech Info: Adequate    SPECIAL CARE FACTORS FREQUENCY  PT (By licensed PT), OT (By licensed OT)     PT Frequency: 5x a week OT Frequency: 5x a week            Contractures Contractures Info: Not present    Additional Factors Info  Code Status, Allergies Code Status Info: Full Allergies Info: Penicillin           Current Medications (08/27/2019):  This is the current hospital active medication list Current Facility-Administered Medications  Medication Dose Route Frequency Provider Last Rate Last Admin  . 0.9 %  sodium chloride infusion   Intravenous PRN Loletha Grayer, MD 10 mL/hr at 08/27/19 0530 10 mL at 08/27/19 0530  . acetaminophen (TYLENOL) tablet 650 mg  650 mg Oral Q6H PRN Nicolette Bang, DO   650 mg at 08/27/19 5035   Or  . acetaminophen (TYLENOL) suppository 650 mg  650 mg Rectal Q6H PRN Nicolette Bang, DO      . Ampicillin-Sulbactam (UNASYN) 3 g in sodium chloride 0.9 % 100 mL IVPB  3 g Intravenous Q6H Loletha Grayer, MD 200 mL/hr at 08/27/19 0533 3 g at 08/27/19 0533  . dextrose 5 % and 0.9 % NaCl with KCl 20 mEq/L infusion   Intravenous Continuous Loletha Grayer, MD 40 mL/hr at 08/26/19 1440 New Bag at 08/26/19 1440  . enoxaparin (LOVENOX) injection 40 mg  40 mg Subcutaneous Q24H Nicolette Bang, DO   40 mg at 08/27/19 0848  . pantoprazole (PROTONIX) injection 40 mg  40 mg Intravenous Q12H Loletha Grayer, MD   40 mg at 08/27/19 0848  . vancomycin  (VANCOCIN) 50 mg/mL oral solution 125 mg  125 mg Oral QID Loletha Grayer, MD   125 mg at 08/27/19 0848     Discharge Medications: Please see discharge summary for a list of discharge medications.  Relevant Imaging Results:  Relevant Lab Results:   Additional Information SSN: [id number], Enteric Precautions  Victorino Dike, RN

## 2019-08-27 NOTE — Progress Notes (Signed)
That does not go away  Progress Note  Patient Name: Albert [name] Date of Encounter: 08/27/2019  Primary Cardiologist: Iverson Alamin, Dr. Fletcher Anon  Patient Profile     86 y.o. male with a PMH notable for HTN, HLD, CKD-3 with Covid infection in winter 2020, prior history of cancer and recurrent admissions for GI bleed (esophageal and duodenal ulcers), and urosepsis who presented from skilled nursing facility with worsening dyspnea, mental status and abdominal asystole was pain found to have pneumonia and yesterday determined to have C. difficile colitis. He has been followed for the evaluation of abnormal EKG and elevated rates in the setting of sepsis with recent lab results showing Cdiff.  -> He has a baseline wide-complex IVCD on EKG and has had intermittent tachycardia concerning for SVT or VT, but on telemetry 08/26/2019 suggested atrial fibrillation  Subjective   Chest pain feels okay.  Breathing better.  Overall feels better. No real sensation of irregular heartbeats or palpitations. Edema improved. Still notes increased BM, but not painful.  Inpatient Medications    Scheduled Meds: . enoxaparin (LOVENOX) injection  40 mg Subcutaneous Q24H  . pantoprazole (PROTONIX) IV  40 mg Intravenous Q12H  . vancomycin  125 mg Oral QID   Continuous Infusions: . sodium chloride 10 mL (08/27/19 0530)  . ampicillin-sulbactam (UNASYN) IV 3 g (08/27/19 0533)  . dextrose 5 % and 0.9 % NaCl with KCl 20 mEq/L 40 mL/hr at 08/26/19 1440   PRN Meds: sodium chloride, acetaminophen **OR** acetaminophen   Vital Signs    Vitals:   08/26/19 1646 08/26/19 1950 08/27/19 0510 08/27/19 0746  BP: (!) 117/48 134/60 112/64 110/61  Pulse: (!) 102 (!) 107 (!) 105 (!) 106  Resp: 18 20 19    Temp: 98.5 F (36.9 C) 98.6 F (37 C) 98.2 F (36.8 C) 98.2 F (36.8 C)  TempSrc:  Oral Oral Oral  SpO2: 99% 97% 97% 98%  Weight:   69.5 kg   Height:        Intake/Output Summary (Last 24 hours) at 08/27/2019  1040 Last data filed at 08/27/2019 0700 Gross per 24 hour  Intake 480.06 ml  Output 500 ml  Net -19.94 ml   Last 3 Weights 08/27/2019 08/24/2019 08/14/2019  Weight (lbs) 153 lb 3.5 oz 153 lb 14.1 oz 153 lb 14.1 oz  Weight (kg) 69.5 kg 69.8 kg 69.8 kg      Telemetry    Sinus tachycardia, atrial fibrillation, and atrial fibrillation with aberrancy noted this admission.   Currently ST with rates in the low 100s - Personally Reviewed  ECG    No new EKG.- Personally Reviewed  Physical Exam    GEN:  Chronically ill-appearing elderly gentleman.  Baseline tremor but no acute distress. Neck: +JVD -but somewhat decreased. Cardiac:  Tachycardic, irregular; soft 1/6 SEM at RUSB.  Otherwise no M/R/G.  Respiratory:  Mild basal crackles, but more clear.  Limited exam due to patient's weakness and unable to sit up.[place] Kitchen   GI: Soft, nondistended.  Increased bowel sounds no tenderness MS:  1-2+ bilateral pitting edema in the feet.  Both feet have pressure wound dressings in place.  Full body eczematous (dry/flaking) rash with erythema on the chest. Neuro:   Nonfocal Psych: Normal mood and affect  Labs    High Sensitivity Troponin:   Recent Labs  Lab 08/09/19 1727 08/09/19 1911 08/24/19 2037 08/24/19 2238  TROPONINIHS 43* 32* 97* 93*      Chemistry Recent Labs  Lab 08/24/19 2037 08/24/19  2037 08/25/19 0130 08/26/19 1423 08/27/19 0557  NA 140   < > 139 141 143  K 4.5   < > 4.5 3.8 3.4*  CL 109   < > 111 111 112*  CO2 21*   < > 20* 20* 18*  GLUCOSE 111*   < > 94 83 85  BUN 24*   < > 24* 26* 23  CREATININE 1.21   < > 1.13 1.16 1.13  CALCIUM 7.5*   < > 7.3* 7.4* 7.3*  PROT 5.7*  --   --   --   --   ALBUMIN 2.2*  --   --   --   --   AST 35  --   --   --   --   ALT 15  --   --   --   --   ALKPHOS 65  --   --   --   --   BILITOT 1.0  --   --   --   --   GFRNONAA 55*   < > 59* 58* 59*  GFRAA >60   < > >60 >60 >60  ANIONGAP 10   < > 8 10 13    < > = values in this interval not  displayed.     Hematology Recent Labs  Lab 08/25/19 0130 08/26/19 0539 08/27/19 0557  WBC 14.6* 9.8 10.0  RBC 3.09* 2.85* 2.89*  HGB 9.0* 8.2* 8.4*  HCT 29.1* 26.1* 26.4*  MCV 94.2 91.6 91.3  MCH 29.1 28.8 29.1  MCHC 30.9 31.4 31.8  RDW 17.4* 17.2* 17.2*  PLT 207 176 193    BNP Recent Labs  Lab 08/24/19 2037  BNP 728.7*     DDimer No results for input(s): DDIMER in the last 168 hours.   Radiology    DG ESOPHAGUS W SINGLE CM (SOL OR THIN BA)  Result Date: 08/26/2019 CLINICAL DATA:  Dysphagia EXAM: ESOPHOGRAM/BARIUM SWALLOW TECHNIQUE: Single contrast examination was performed using  thin barium. FLUOROSCOPY TIME:  Fluoroscopy Time:  48 seconds Radiation Exposure Index (if provided by the fluoroscopic device): 14 COMPARISON:  None. FINDINGS: The patient is unable to stand. Single-contrast esophagram attempted with the patient approximately 40 degrees upright. With 2 small swallows, there was immediate aspiration into the tracheobronchial tree. Small hiatal hernia also noted. At this point the procedure was stopped. Images obtained for documentation. IMPRESSION: Positive exam for immediate aspiration into the tracheobronchial tree. Small hiatal hernia These results were called by telephone at the time of interpretation on 08/26/2019 at 1:00pm to provider Loletha Grayer , who verbally acknowledged these results. Electronically Signed   By: Jerilynn Mages.  Shick M.D.   On: 08/26/2019 13:20    Cardiac Studies   Echo (8 1/21): EF 6065%.  No R WMA.  Mild LVH.  Impaired laxation.  GR 1 DD).  Mildly enlarged RV but normal function.  Severe LA dilation.  Mild RA dilation.  Not a moderate stenosis.    Assessment & Plan    Newly diagnosed paroxysmal atrial fibrillation with RVR / with aberrancy  No prior documentation of arrhythmia until this admission-would like to have A. fib confirmed on EKG. This is likely triggered by HCAP/C. difficile sepsis with difficult to control as result of underlying  infection.  Process potential component of is and given low TSH. Also need to closely monitor magnesium and potassium levels. (Goal Mag 2.0, K+ 4.0)  Currently rates are pretty well controlled, and would avoid overtreatment illness. Want to avoid  hypotension misconstrued as sepsis/septic shock. Intermittent heart rates in the 100-110 range.  If there are further exacerbations of wide-complex tachycardia suggestive of either VT or A. fib with aberrancy, would consider IV amiodarone bolus plus or minus conversion to oral amiodarone. This would preclude the need for beta-blocker/calcium channel blocker potential blood pressure lowering effect.  Beta-blockers and calcium channel blockers would be less than favorable given hypotension.  CHA2DS2-VASc score is at least 4 (CHF, HTN, age x2)--would recommend [place] for stroke prevention, however given recent hospitalization for GI bleed and ongoing anemia -> would hold off on DOAC for now.  Agree with idea to consider outpatient Zio patch monitor to determine burden of A. fib.  Elevated HS Tn --No CP.  High-sensitivity troponin minimally elevated and flat trending. Likely supply demand ischemia in the setting of sepsis with hypoalbuminemia and recent GIB.  8/1 echo as above with normal EF and no regional wall motion abnormalities. Not an ideal candidate for further ischemic workup given his illness at this time. No current plan for emergent ischemic work-up/noninvasive ischemic work-up at this time.  Continue current medications. Could consider low-dose beta-blocker once he has recovered from his current infection and there is room in BP.  Diastolic dysfunction noted on echo -> in setting of tachycardia with IVCD, there could be a component of HFpEF. -He has noted some dyspnea on exertion and orthopnea. Was found to be volume up on exam yesterday with crackles in the bases and moderate bilateral edema.  Avoid excess volume resuscitation, consider judicious  diuretic provided stable renal function blood pressure.  Anemia with history of GI bleed: Stable hemoglobin at 8.4  Recent GI bleed with ongoing anemia--defer management to primary team, but this limits our ability to consider DOAC for A. Fib.  Ongoing treatment of pneumonia and C. difficile per primary team -> on Unasyn plus oral vancomycin Abnormal TSH /hyperthyroidism: Thyroid panel looks normal relatively normal TSH with mildly elevated free T4 on follow-up labs.   For questions or updates, please contact [place] Please consult [email or web address] for contact info under        Signed, Glenetta Hew, MD  08/27/2019, 10:40 AM

## 2019-08-27 NOTE — Evaluation (Signed)
Occupational Therapy Evaluation Patient Details Name: Albert [name] MRN: [id number] DOB: 1933/05/19 Today's Date: 08/27/2019    History of Present Illness Albert [name] is 86 y.o. male with PMH of CKD, HTN, HLD with recent admission for GI bleed and urosepsis presents from SNF with concerns for fever and hypotensive. Admitted for sepsis and aspiration pneunonia. Also with C. diff. Wide-complex tachycardia secondary to sepsis. Several recent hospitalizations.   Clinical Impression   Pt was seen for OT evaluation this date. Prior to hospital admission, pt was at SNF recovering since last hospitalization earlier this month. Prior to that, pt reports being MOD I with cane primarily. Currently pt demonstrates impairments as described below (See OT problem list) which functionally limit his ability to perform ADL/self-care tasks. Pt currently requires MAX to TOTAL A for lateral rolling in bed and cannot tolerate sup to sit transition on OT assessment. He requires at least MAX A with LB ADLs at bed level and setup for bed level/supported long sitting UB ADLs.  Pt demos gross weakness and decreased fxl activity tolerance on assessment and would benefit from skilled OT to address noted impairments and functional limitations in order to maximize safety and independence while minimizing falls risk and caregiver burden. Upon hospital discharge, recommend STR to maximize pt safety and return to PLOF.     Follow Up Recommendations  SNF    Equipment Recommendations  Other (comment) (defer to next level of care)    Recommendations for Other Services       Precautions / Restrictions Precautions Precautions: Fall;Other (comment) Precaution Comments: C.Diff Restrictions Weight Bearing Restrictions: No Other Position/Activity Restrictions: skin rash/sensitivity      Mobility Bed Mobility Overal bed mobility: Needs Assistance Bed Mobility: Rolling Rolling: Max assist         General bed mobility  comments: increased effort for lateral rolling with verbal cues for hand placement, pt does not feel able to attempt sup to sit transition this date, deferred on OT evaluation  Transfers                 General transfer comment: unable to attempt t/f this date d/t pain and weakness, anticipate 2p assist needed to assess.    Balance       Sitting balance - Comments: NT       Standing balance comment: NT                           ADL either performed or assessed with clinical judgement   ADL Overall ADL's : Needs assistance/impaired                                       General ADL Comments: MAX to TOTAL A for LB ADLs, setup to complete bed level UB ADLs with HOB elevated providing support     Vision Baseline Vision/History: Wears glasses Wears Glasses: At all times Patient Visual Report: No change from baseline       Perception     Praxis      Pertinent Vitals/Pain Pain Assessment: Faces Faces Pain Scale: Hurts even more Pain Location: back/skin Pain Descriptors / Indicators: Grimacing;Tender Pain Intervention(s): Limited activity within patient's tolerance;Monitored during session     Hand Dominance Right   Extremity/Trunk Assessment Upper Extremity Assessment Upper Extremity Assessment: Generalized weakness (R shld flex ~1/4 arc of motion, L <1/4,  grossly weak. MMT of elbow 3/5, grip 3+/5 B'ly)   Lower Extremity Assessment Lower Extremity Assessment: Defer to PT evaluation;Generalized weakness       Communication Communication Communication: No difficulties   Cognition Arousal/Alertness: Awake/alert Behavior During Therapy: WFL for tasks assessed/performed Overall Cognitive Status: Within Functional Limits for tasks assessed                                 General Comments: alert and oriented to self, place, time, and at least partially to situation. Pt reports been having problems for about 3 weeks.  Efforts at mobility suggest difficulty longer.   General Comments       Exercises Other Exercises Other Exercises: OT facilitates ed re: role of OT with pt somewhat familiar d/t recent hospitalization. Ed re: importance of OOB Activity including prevention of PNA, skin breakdown and muscle atrophy. Pt with moderate understanding.   Shoulder Instructions      Home Living Family/patient expects to be discharged to:: Skilled nursing facility Living Arrangements: Alone Available Help at Discharge: Family;Available PRN/intermittently;Friend(s) Type of Home: House Home Access: Stairs to enter [employer] of Steps: 1 (3 inch) step up Entrance Stairs-Rails: Right;Left Home Layout: Able to live on main level with bedroom/bathroom     Bathroom Shower/Tub: Occupational psychologist: Handicapped height     Home Equipment: Environmental consultant - 2 wheels;Cane - single point;Shower seat - built in;Grab bars - tub/shower;Grab bars - toilet;Bedside commode;Walker - 4 wheels;Hand held shower head   Additional Comments: states he no longer has a lift chair      Prior Functioning/Environment Level of Independence: Needs assistance  Gait / Transfers Assistance Needed: Per past documentation pt ambulates with SPC vs rollator vs 2WW depending on pt's knee pain. Pateint reports he used to ambulate outside with [facility] prior to getting sick with GI illnesses. ADL's / Homemaking Assistance Needed: Patient reports he used to drive and be able to do everything for himself until recent GI illness. He states he has freinds and family that helps him with whatevr he needs now. He speaks of a caregiver but with further questionning states various family and freinds help him whenever he needs it. Prior documentation states: 3x's per week pt's cousin comes to assist as needed (with groceries, MD appts, etc); pt reports needing significantly more assist for bathing, dressing, and all IADL Tasks the last few months             OT Problem List: Decreased strength;Decreased range of motion;Pain;Decreased activity tolerance;Impaired balance (sitting and/or standing);Decreased knowledge of use of DME or AE      OT Treatment/Interventions: Self-care/ADL training;Therapeutic exercise;Therapeutic activities;DME and/or AE instruction;Patient/family education;Balance training    OT Goals(Current goals can be found in the care plan section) Acute Rehab OT Goals Patient Stated Goal: go back to rehab to get stronger before returning home OT Goal Formulation: With patient Time For Goal Achievement: 09/10/19 Potential to Achieve Goals: Good ADL Goals Pt Will Perform Grooming: with set-up;with min guard assist;sitting Pt Will Perform Upper Body Dressing: with min guard assist;with min assist;sitting Pt/caregiver will Perform Home Exercise Program: Increased ROM;Both right and left upper extremity;With minimal assist Additional ADL Goal #1: Pt will tolerate increased mobilization during therapy participation (at least sup to sit transition with MAX A) to further allow for development of OT POC.  OT Frequency: Min 1X/week   Barriers to D/C: Decreased caregiver support  Co-evaluation              AM-PAC OT "6 Clicks" Daily Activity     Outcome Measure Help from another person eating meals?: A Little Help from another person taking care of personal grooming?: A Little Help from another person toileting, which includes using toliet, bedpan, or urinal?: A Lot Help from another person bathing (including washing, rinsing, drying)?: A Lot Help from another person to put on and taking off regular upper body clothing?: A Lot Help from another person to put on and taking off regular lower body clothing?: A Lot 6 Click Score: 14   End of Session Nurse Communication: Mobility status;Other (comment) (L UE weeping near IV site)  Activity Tolerance: Patient limited by fatigue;Patient limited by  pain Patient left: in bed;with call bell/phone within reach;with bed alarm set;with nursing/sitter in room (nurse presenting to assess L UE PIV)  OT Visit Diagnosis: Other abnormalities of gait and mobility (R26.89);Muscle weakness (generalized) (M62.81);Pain Pain - part of body:  (back)                Time: 1700-1727 OT Time Calculation (min): 27 min Charges:  OT General Charges $OT Visit: 1 Visit OT Evaluation $OT Eval Moderate Complexity: 1 Mod OT Treatments $Self Care/Home Management : 8-22 mins  Gerrianne Scale, MS, OTR/L ascom [phone] 08/27/19, 7:11 PM

## 2019-08-27 NOTE — Plan of Care (Signed)
  Problem: Education: Goal: Knowledge of General Education information will improve Description: Including pain rating scale, medication(s)/side effects and non-pharmacologic comfort measures Outcome: Progressing   Problem: Clinical Measurements: Goal: Respiratory complications will improve Outcome: Progressing   Problem: Clinical Measurements: Goal: Signs and symptoms of infection will decrease Outcome: Progressing   Problem: Safety: Goal: Ability to remain free from injury will improve Outcome: Progressing

## 2019-08-27 NOTE — TOC Initial Note (Signed)
Transition of Care [facility]) - Initial/Assessment Note    Patient Details  Name: Albert [name] MRN: [id number] Date of Birth: [birth year]  [facility] CM/SW Contact:    Victorino Dike, RN Phone Number: 08/27/2019, 10:50 AM  Clinical Narrative:                  Patient reports living at home alone, but reports family takes turns as caregivers and stay with him often.  He reports he was able to drive and do grocery shopping and pick up prescriptions for himself, but that family would be willing to run errands for him is necessary.    DME in home:  Multiple canes, rollater, rolling walker, bsc, grab bars and shower seats built in.    Short term rehab recommended by PT, Patient agreeable to go.   Expected Discharge Plan: Skilled Nursing Facility Barriers to Discharge: Continued Medical Work up   Patient Goals and CMS Choice Patient states their goals for this hospitalization and ongoing recovery are:: to go to rehab and get better CMS Medicare.gov Compare Post Acute Care list provided to:: Patient Choice offered to / list presented to : Patient  Expected Discharge Plan and Services Expected Discharge Plan: [place] In-house Referral:  (Caregivers (family) that take turns in the home) Discharge Planning Services: CM Consult Post Acute Care Choice: [place]                                        Prior Living Arrangements/Services   Lives with:: Self Patient language and need for interpreter reviewed:: Yes Do you feel safe going back to the place where you live?: Yes      Need for Family Participation in Patient Care: Yes (Comment) Care giver support system in place?: Yes (comment) Current home services: DME (canes, rolling walker, rollater, special showers with seats, Bedside commode and grab bars.) Criminal Activity/Legal Involvement Pertinent to Current Situation/Hospitalization: No - Comment as needed  Activities of  Daily Living Home Assistive Devices/Equipment: Environmental consultant (specify type), Cane (specify quad or straight), Eyeglasses, Dentures (specify type) ADL Screening (condition at time of admission) Patient's cognitive ability adequate to safely complete daily activities?: Yes Is the patient deaf or have difficulty hearing?: No Does the patient have difficulty seeing, even when wearing glasses/contacts?: No Does the patient have difficulty concentrating, remembering, or making decisions?: No Patient able to express need for assistance with ADLs?: Yes Does the patient have difficulty dressing or bathing?: Yes Independently performs ADLs?: No Communication: Independent Dressing (OT): Needs assistance Is this a change from baseline?: Pre-admission baseline Grooming: Needs assistance Is this a change from baseline?: Pre-admission baseline Feeding: Independent Bathing: Needs assistance Is this a change from baseline?: Pre-admission baseline Toileting: Needs assistance Is this a change from baseline?: Pre-admission baseline In/Out Bed: Needs assistance Is this a change from baseline?: Pre-admission baseline Walks in Home: Needs assistance Is this a change from baseline?: Pre-admission baseline Does the patient have difficulty walking or climbing stairs?: No Weakness of Legs: Both Weakness of Arms/Hands: None  Permission Sought/Granted                  Emotional Assessment Appearance:: Appears stated age Attitude/Demeanor/Rapport: Engaged Affect (typically observed): Appropriate, Accepting Orientation: : Oriented to Self, Oriented to Place, Oriented to  Time, Oriented to Situation Alcohol / Substance Use: Not Applicable Psych Involvement: No (comment)  Admission diagnosis:  Healthcare-associated pneumonia [J18.9] Sepsis ([place]) [A41.9] Sepsis, due to unspecified organism, unspecified whether acute organ dysfunction present [facility]) [A41.9] Patient Active Problem List   Diagnosis Date Noted  .  Aspiration pneumonia of right lower lobe due to gastric secretions ([place])   . Dysphagia   . C. difficile colitis   . Ulcer of esophagus without bleeding   . Lobar pneumonia ([place])   . Wide-complex tachycardia ([place])   . Sepsis ([place]) 08/24/2019  . Lactic acidosis 08/24/2019  . Anemia 08/24/2019  . Elevated brain natriuretic peptide (BNP) level 08/24/2019  . Protein-calorie malnutrition, severe 08/12/2019  . GIB (gastrointestinal bleeding) 08/10/2019  . GI bleed 08/09/2019  . Acute GI bleeding 08/04/2019  . Symptomatic anemia 08/04/2019  . Coagulopathy ([place]) 08/04/2019  . Metabolic acidosis 40/37/0964  . SIRS (systemic inflammatory response syndrome) ([place]) 08/04/2019  . UGI bleed 08/04/2019  . Dehydration 11/24/2018  . CKD (chronic kidney disease), stage III 11/24/2018  . Generalized weakness 11/22/2018  . Acute renal failure superimposed on stage 3a chronic kidney disease ([place])   . Elevated troponin   . Hyperlipidemia 06/15/2014  . Benign essential hypertension 06/15/2013   PCP:  [name], MD Pharmacy:   [facility] [place], [place] [address] [place] [address] Phone: [phone] Fax: [phone]     Social Determinants of Health (SDOH) Interventions    Readmission Risk Interventions No flowsheet data found.

## 2019-08-27 NOTE — Progress Notes (Signed)
Patient ID: Albert [name], male   DOB: August 23, 1933, 86 y.o.   MRN: [id number] [facility] PROGRESS NOTE  Albert [name] [facility]:[id number] DOB: 1933-01-11 DOA: 08/24/2019 PCP: [name], MD  HPI/Subjective: Patient feeling a little bit better. Is wanting something to eat but understands that he needs to wait for the test tomorrow. He stated they did okay swallowing the vancomycin today.  Objective: Vitals:   08/27/19 0746 08/27/19 1222  BP: 110/61 100/72  Pulse: (!) 106 98  Resp:    Temp: 98.2 F (36.8 C) 98.1 F (36.7 C)  SpO2: 98% 99%    Intake/Output Summary (Last 24 hours) at 08/27/2019 1606 Last data filed at 08/27/2019 0700 Gross per 24 hour  Intake 480.06 ml  Output 500 ml  Net -19.94 ml   Filed Weights   08/24/19 2053 08/27/19 0510  Weight: 69.8 kg 69.5 kg    ROS: Review of Systems  Respiratory: Positive for cough and shortness of breath.   Cardiovascular: Negative for chest pain.  Gastrointestinal: Positive for abdominal pain and diarrhea. Negative for nausea and vomiting.   Exam: Physical Exam HENT:     Mouth/Throat:     Pharynx: No oropharyngeal exudate.  Eyes:     General: Lids are normal.     Conjunctiva/sclera: Conjunctivae normal.     Pupils: Pupils are equal, round, and reactive to light.  Cardiovascular:     Rate and Rhythm: Normal rate and regular rhythm.     Heart sounds: Normal heart sounds, S1 normal and S2 normal.  Pulmonary:     Breath sounds: Examination of the right-lower field reveals decreased breath sounds. Decreased breath sounds present. No wheezing, rhonchi or rales.  Abdominal:     Palpations: Abdomen is soft.     Tenderness: There is no abdominal tenderness.  Musculoskeletal:     Right lower leg: No swelling.     Left lower leg: No swelling.  Skin:    General: Skin is warm.  Neurological:     Mental Status: He is alert and oriented to person, place, and time.       Data Reviewed: Basic Metabolic Panel: Recent  Labs  Lab 08/24/19 2037 08/25/19 0130 08/26/19 1423 08/27/19 0557  NA 140 139 141 143  K 4.5 4.5 3.8 3.4*  CL 109 111 111 112*  CO2 21* 20* 20* 18*  GLUCOSE 111* 94 83 85  BUN 24* 24* 26* 23  CREATININE 1.21 1.13 1.16 1.13  CALCIUM 7.5* 7.3* 7.4* 7.3*  MG  --   --  1.4*  --    Liver Function Tests: Recent Labs  Lab 08/24/19 2037  AST 35  ALT 15  ALKPHOS 65  BILITOT 1.0  PROT 5.7*  ALBUMIN 2.2*   No results for input(s): LIPASE, AMYLASE in the last 168 hours. No results for input(s): AMMONIA in the last 168 hours. CBC: Recent Labs  Lab 08/24/19 2037 08/25/19 0130 08/26/19 0539 08/27/19 0557  WBC 17.3* 14.6* 9.8 10.0  NEUTROABS 15.0*  --   --   --   HGB 9.1* 9.0* 8.2* 8.4*  HCT 29.3* 29.1* 26.1* 26.4*  MCV 92.7 94.2 91.6 91.3  PLT 246 207 176 193   Cardiac Enzymes: No results for input(s): CKTOTAL, CKMB, CKMBINDEX, TROPONINI in the last 168 hours. BNP (last 3 results) Recent Labs    08/24/19 2037  BNP 728.7*    ProBNP (last 3 results) No results for input(s): PROBNP in the last 8760 hours.  CBG: Recent Labs  Lab 08/26/19 0057 08/26/19 0147 08/26/19 0610  GLUCAP 77 88 69*    Recent Results (from the past 240 hour(s))  Urine culture     Status: None   Collection Time: 08/24/19  8:37 PM   Specimen: In/Out Cath Urine  Result Value Ref Range Status   Specimen Description   Final    IN/OUT CATH URINE Performed at [facility], [address] [place], [place] [address]    Special Requests   Final    NONE Performed at [facility], [address], [place], [place] [address]    Culture   Final    NO GROWTH Performed at [facility], [place] [address], [place], [place] [address]    Report Status 08/26/2019 FINAL  Final  SARS Coronavirus 2 by RT PCR (hospital order, performed in [facility] hospital lab) Nasopharyngeal Nasopharyngeal Swab     Status: None   Collection Time: 08/24/19  8:53 PM   Specimen: Nasopharyngeal Swab   Result Value Ref Range Status   SARS Coronavirus 2 NEGATIVE NEGATIVE Final    Comment: (NOTE) SARS-CoV-2 target nucleic acids are NOT DETECTED.  The SARS-CoV-2 RNA is generally detectable in upper and lower respiratory specimens during the acute phase of infection. The lowest concentration of SARS-CoV-2 viral copies this assay can detect is 250 copies / mL. A negative result does not preclude SARS-CoV-2 infection and should not be used as the sole basis for treatment or other patient management decisions.  A negative result may occur with improper specimen collection / handling, submission of specimen other than nasopharyngeal swab, presence of viral mutation(s) within the areas targeted by this assay, and inadequate number of viral copies (<250 copies / mL). A negative result must be combined with clinical observations, patient history, and epidemiological information.  Fact Sheet for Patients:   StrictlyIdeas.no  Fact Sheet for Healthcare Providers: BankingDealers.co.za  This test is not yet approved or  cleared by the [place] FDA and has been authorized for detection and/or diagnosis of SARS-CoV-2 by FDA under an Emergency Use Authorization (EUA).  This EUA will remain in effect (meaning this test can be used) for the duration of the COVID-19 declaration under Section 564(b)(1) of the Act, 21 U.S.C. section 360bbb-3(b)(1), unless the authorization is terminated or revoked sooner.  Performed at [facility], [address] [place], [place] [address]   Blood Culture (routine x 2)     Status: None (Preliminary result)   Collection Time: 08/24/19  9:00 PM   Specimen: BLOOD  Result Value Ref Range Status   Specimen Description BLOOD BLOOD LEFT FOREARM  Final   Special Requests   Final    BOTTLES DRAWN AEROBIC AND ANAEROBIC Blood Culture results may not be optimal due to an excessive volume of blood received in  culture bottles   Culture   Final    NO GROWTH 3 DAYS Performed at [facility], [address], [place],  [address]    Report Status PENDING  Incomplete  Blood Culture (routine x 2)     Status: None (Preliminary result)   Collection Time: 08/24/19  9:05 PM   Specimen: BLOOD  Result Value Ref Range Status   Specimen Description BLOOD BLOOD LEFT HAND  Final   Special Requests   Final    BOTTLES DRAWN AEROBIC AND ANAEROBIC Blood Culture adequate volume   Culture   Final    NO GROWTH 3 DAYS Performed at [facility], [address] [place], [place] [address]    Report Status PENDING  Incomplete  MRSA PCR Screening     Status: None   Collection Time: 08/25/19 10:33 PM   Specimen: Nasopharyngeal  Result Value Ref Range Status   MRSA by PCR NEGATIVE NEGATIVE Final    Comment:        The GeneXpert MRSA Assay (FDA approved for NASAL specimens only), is one component of a comprehensive MRSA colonization surveillance program. It is not intended to diagnose MRSA infection nor to guide or monitor treatment for MRSA infections. Performed at [facility], [place], [place] [address]   C Difficile Quick Screen w PCR reflex     Status: Abnormal   Collection Time: 08/26/19 10:22 AM   Specimen: STOOL  Result Value Ref Range Status   C Diff antigen POSITIVE (A) NEGATIVE Final   C Diff toxin POSITIVE (A) NEGATIVE Final   C Diff interpretation Toxin producing C. difficile detected.  Final    Comment: CRITICAL RESULT CALLED TO, READ BACK BY AND VERIFIED WITH: ANNIE RITCHIE AT 1128 08/26/19.PMF Performed at [facility], [address] [place], [place] [address]      Studies: DG ESOPHAGUS W SINGLE CM (SOL OR THIN BA)  Result Date: 08/26/2019 CLINICAL DATA:  Dysphagia EXAM: ESOPHOGRAM/BARIUM SWALLOW TECHNIQUE: Single contrast examination was performed using  thin barium. FLUOROSCOPY TIME:  Fluoroscopy Time:  48 seconds  Radiation Exposure Index (if provided by the fluoroscopic device): 14 COMPARISON:  None. FINDINGS: The patient is unable to stand. Single-contrast esophagram attempted with the patient approximately 40 degrees upright. With 2 small swallows, there was immediate aspiration into the tracheobronchial tree. Small hiatal hernia also noted. At this point the procedure was stopped. Images obtained for documentation. IMPRESSION: Positive exam for immediate aspiration into the tracheobronchial tree. Small hiatal hernia These results were called by telephone at the time of interpretation on 08/26/2019 at 1:00pm to provider Loletha Grayer , who verbally acknowledged these results. Electronically Signed   By: Jerilynn Mages.  Shick M.D.   On: 08/26/2019 13:20    Scheduled Meds: . enoxaparin (LOVENOX) injection  40 mg Subcutaneous Q24H  . pantoprazole (PROTONIX) IV  40 mg Intravenous Q12H  . vancomycin  125 mg Oral QID   Continuous Infusions: . sodium chloride 10 mL (08/27/19 0530)  . ampicillin-sulbactam (UNASYN) IV 3 g (08/27/19 1141)  . dextrose 5 % and 0.9 % NaCl with KCl 20 mEq/L 40 mL/hr at 08/27/19 1141    Assessment/Plan:  1. Sepsis with hypotension. This is improved. Patient has right lower lobe pneumonia secondary to aspiration on Unasyn will do a 5-day course total. Patient also has C. difficile colitis on p.o. vancomycin. Patient stated he swallowed this okay today. If not may have to do rectal vancomycin. 2. C. difficile colitis. Continue p.o. vancomycin. 3. Dysphagia with aspiration on esophagram. Speech therapy to do a modified barium swallow Monday. N.p.o. status with sips with meds for now until we figure out a diet that he can handle. 4. Wide-complex tachycardia secondary to sepsis.  5. Hyperlipidemia. Hold atorvastatin 6. Chronic kidney disease stage IIIa. Creatinine 1.13 today 7. Iron deficiency anemia from prior blood loss. Continue to monitor hemoglobin. Hemoglobin down to 8.4 today. 8. Esophageal  ulcer and esophagitis on IV Protonix with dysphagia. 9. 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 Will need oncology evaluation as outpatient.     Code Status:     Code Status Orders  (From admission, onward)  Start     Ordered   08/25/19 0015  Full code  Continuous        08/25/19 0014        Code Status History    Date Active Date Inactive Code Status Order ID Comments User Context   08/09/2019 2026 08/14/2019 2249 Full Code [id number]  Bradly Bienenstock, NP ED   08/04/2019 1827 08/08/2019 2341 Full Code [id number]  Georgette Shell, MD ED   08/04/2019 0533 08/04/2019 1826 Full Code [id number]  Vianne Bulls, MD ED   11/23/2018 2241 12/01/2018 1601 Full Code [id number]  Phillips Grout, MD Inpatient   11/22/2018 1732 11/23/2018 2212 Full Code [id number]  Para Skeans, MD ED   Advance Care Planning Activity    Advance Directive Documentation     Most Recent Value  Type of Advance Directive Healthcare Power of Attorney  Pre-existing out of facility DNR order (yellow form or pink MOST form) --  "MOST" Form in Place? --     Family Communication: Spoke with [name] yesterday. Disposition Plan: Status is: Inpatient  Dispo: The patient is from: Rehab              Anticipated d/c is to: Rehab              Anticipated d/c date is: Dependent on whether he passes the swallow evaluation tomorrow or not.              Patient currently being treated for aspiration pneumonia and C. difficile colitis. We need to figure out if he can swallow.  Antibiotics:  IV Unasyn  P.o. Vanco  Time spent: 28 minutes  [place]

## 2019-08-28 ENCOUNTER — Inpatient Hospital Stay: Payer: Medicare Other

## 2019-08-28 DIAGNOSIS — E876 Hypokalemia: Secondary | ICD-10-CM

## 2019-08-28 DIAGNOSIS — I479 Paroxysmal tachycardia, unspecified: Secondary | ICD-10-CM

## 2019-08-28 LAB — OCCULT BLOOD X 1 CARD TO LAB, STOOL: Fecal Occult Bld: NEGATIVE

## 2019-08-28 LAB — BASIC METABOLIC PANEL
Anion gap: 9 (ref 5–15)
BUN: 21 mg/dL (ref 8–23)
CO2: 19 mmol/L — ABNORMAL LOW (ref 22–32)
Calcium: 7.1 mg/dL — ABNORMAL LOW (ref 8.9–10.3)
Chloride: 118 mmol/L — ABNORMAL HIGH (ref 98–111)
Creatinine, Ser: 1.01 mg/dL (ref 0.61–1.24)
GFR calc Af Amer: >60 mL/min (ref >60)
GFR calc non Af Amer: >60 mL/min (ref >60)
Glucose, Bld: 83 mg/dL (ref 70–99)
Potassium: 3.3 mmol/L — ABNORMAL LOW (ref 3.5–5.1)
Sodium: 146 mmol/L — ABNORMAL HIGH (ref 135–145)

## 2019-08-28 LAB — CBC
HCT: 27 % — ABNORMAL LOW (ref 39.0–52.0)
Hemoglobin: 8.3 g/dL — ABNORMAL LOW (ref 13.0–17.0)
MCH: 28.2 pg (ref 26.0–34.0)
MCHC: 30.7 g/dL (ref 30.0–36.0)
MCV: 91.8 fL (ref 80.0–100.0)
Platelets: 214 10*3/uL (ref 150–400)
RBC: 2.94 MIL/uL — ABNORMAL LOW (ref 4.22–5.81)
RDW: 17.2 % — ABNORMAL HIGH (ref 11.5–15.5)
WBC: 9.5 10*3/uL (ref 4.0–10.5)
nRBC: 0 % (ref 0.0–0.2)

## 2019-08-28 LAB — MAGNESIUM: Magnesium: 1.6 mg/dL — ABNORMAL LOW (ref 1.7–2.4)

## 2019-08-28 MED ORDER — MAGNESIUM SULFATE 2 GM/50ML IV SOLN
2.0000 g | Freq: Once | INTRAVENOUS | Status: AC
  Administered 2019-08-28: 2 g via INTRAVENOUS
  Filled 2019-08-28: qty 50

## 2019-08-28 MED ORDER — METRONIDAZOLE IN NACL 5-0.79 MG/ML-% IV SOLN
500.0000 mg | Freq: Three times a day (TID) | INTRAVENOUS | Status: DC
  Administered 2019-08-28 – 2019-08-29 (×4): 500 mg via INTRAVENOUS
  Filled 2019-08-28 (×6): qty 100

## 2019-08-28 MED ORDER — METOPROLOL TARTRATE 25 MG PO TABS
12.5000 mg | ORAL_TABLET | Freq: Two times a day (BID) | ORAL | Status: DC
  Administered 2019-08-28 – 2019-08-30 (×4): 12.5 mg via ORAL
  Filled 2019-08-28 (×4): qty 1

## 2019-08-28 MED ORDER — VANCOMYCIN HCL 500 MG IV SOLR
500.0000 mg | Freq: Four times a day (QID) | Status: DC
  Administered 2019-08-29 (×3): 500 mg via RECTAL
  Filled 2019-08-28 (×7): qty 500

## 2019-08-28 NOTE — Progress Notes (Signed)
Progress Note  Patient Name: Albert [name] Date of Encounter: 08/28/2019  Primary Cardiologist: Kathlyn Sacramento, MD  Subjective   Feels that breathing is much improved.  Diarrhea improving - less frequent.  No chest pain or palpitations.    Inpatient Medications    Scheduled Meds: . enoxaparin (LOVENOX) injection  40 mg Subcutaneous Q24H  . pantoprazole (PROTONIX) IV  40 mg Intravenous Q12H  . vancomycin  125 mg Oral QID   Continuous Infusions: . sodium chloride 10 mL (08/28/19 0604)  . ampicillin-sulbactam (UNASYN) IV 3 g (08/28/19 0617)  . dextrose 5 % and 0.9 % NaCl with KCl 20 mEq/L 40 mL/hr at 08/27/19 1141  . magnesium sulfate bolus IVPB 2 g (08/28/19 0951)   PRN Meds: sodium chloride, acetaminophen **OR** acetaminophen   Vital Signs    Vitals:   08/27/19 1728 08/27/19 1956 08/28/19 0426 08/28/19 0809  BP: 127/72 (!) 121/58 (!) 117/55 114/61  Pulse: (!) 102 (!) 103 (!) 101 93  Resp:    17  Temp: 97.9 F (36.6 C) 98.2 F (36.8 C) 97.9 F (36.6 C) 97.7 F (36.5 C)  TempSrc: Oral Oral Oral Oral  SpO2: 98% 100% 98% 98%  Weight:   69.6 kg   Height:        Intake/Output Summary (Last 24 hours) at 08/28/2019 0957 Last data filed at 08/28/2019 0809 Gross per 24 hour  Intake 1251.87 ml  Output 703 ml  Net 548.87 ml   Filed Weights   08/24/19 2053 08/27/19 0510 08/28/19 0426  Weight: 69.8 kg 69.5 kg 69.6 kg    Physical Exam   GEN: Somewhat frail, in no acute distress.  HEENT: Grossly normal.  Neck: Supple, no JVD, carotid bruits, or masses. Cardiac: RRR, distant, 2/6 syst murmur @ apex, no rubs, or gallops. No clubbing, cyanosis, trace bilat LE edema.  Radials/DP/PT 2+ and equal bilaterally.  Respiratory:  Respirations regular and unlabored, diminished breath sounds throughout, more so @ left base. GI: Soft, nontender, nondistended, BS + x 4. MS: no deformity or atrophy. Skin: warm and dry, no rash. Neuro:  Strength and sensation are intact. Psych:  AAOx3.  Normal affect.  Labs    Chemistry Recent Labs  Lab 08/24/19 2037 08/25/19 0130 08/26/19 1423 08/27/19 0557 08/28/19 0416  NA 140   < > 141 143 146*  K 4.5   < > 3.8 3.4* 3.3*  CL 109   < > 111 112* 118*  CO2 21*   < > 20* 18* 19*  GLUCOSE 111*   < > 83 85 83  BUN 24*   < > 26* 23 21  CREATININE 1.21   < > 1.16 1.13 1.01  CALCIUM 7.5*   < > 7.4* 7.3* 7.1*  PROT 5.7*  --   --   --   --   ALBUMIN 2.2*  --   --   --   --   AST 35  --   --   --   --   ALT 15  --   --   --   --   ALKPHOS 65  --   --   --   --   BILITOT 1.0  --   --   --   --   GFRNONAA 55*   < > 58* 59* >60  GFRAA >60   < > >60 >60 >60  ANIONGAP 10   < > 10 13 9    < > = values in this  interval not displayed.     Hematology Recent Labs  Lab 08/26/19 0539 08/27/19 0557 08/28/19 0416  WBC 9.8 10.0 9.5  RBC 2.85* 2.89* 2.94*  HGB 8.2* 8.4* 8.3*  HCT 26.1* 26.4* 27.0*  MCV 91.6 91.3 91.8  MCH 28.8 29.1 28.2  MCHC 31.4 31.8 30.7  RDW 17.2* 17.2* 17.2*  PLT 176 193 214    Cardiac Enzymes  Recent Labs  Lab 08/09/19 1727 08/09/19 1911 08/24/19 2037 08/24/19 2238  TROPONINIHS 43* 32* 97* 93*      BNP Recent Labs  Lab 08/24/19 2037  BNP 728.7*    HbA1c  Lab Results  Component Value Date   HGBA1C 5.1 11/26/2018   Lab Results  Component Value Date   TSH 1.507 08/26/2019    Radiology    DG Chest Port 1 View  Result Date: 08/24/2019 CLINICAL DATA:  Fever and tachycardia. EXAM: PORTABLE CHEST 1 VIEW COMPARISON:  August 09, 2019 FINDINGS: Mild, diffuse chronic appearing increased lung markings are seen. Mild right basilar infiltrate is present. There is no evidence of a pleural effusion or pneumothorax. The cardiac silhouette is mildly enlarged. Moderate severity calcification of the aortic arch is noted. The visualized skeletal structures are unremarkable. IMPRESSION: 1. Chronic appearing increased lung markings with mild right basilar infiltrate. Electronically Signed   By: Virgina Norfolk M.D.   On: 08/24/2019 21:00   DG ESOPHAGUS W SINGLE CM (SOL OR THIN BA)  Result Date: 08/26/2019 CLINICAL DATA:  Dysphagia EXAM: ESOPHOGRAM/BARIUM SWALLOW TECHNIQUE: Single contrast examination was performed using  thin barium. FLUOROSCOPY TIME:  Fluoroscopy Time:  48 seconds Radiation Exposure Index (if provided by the fluoroscopic device): 14 COMPARISON:  None. FINDINGS: The patient is unable to stand. Single-contrast esophagram attempted with the patient approximately 40 degrees upright. With 2 small swallows, there was immediate aspiration into the tracheobronchial tree. Small hiatal hernia also noted. At this point the procedure was stopped. Images obtained for documentation. IMPRESSION: Positive exam for immediate aspiration into the tracheobronchial tree. Small hiatal hernia These results were called by telephone at the time of interpretation on 08/26/2019 at 1:00pm to provider Loletha Grayer , who verbally acknowledged these results. Electronically Signed   By: Jerilynn Mages.  Shick M.D.   On: 08/26/2019 13:20    Telemetry    RSR, 90's freq PACs, PVCs - Personally Reviewed  Cardiac Studies   2D Echocardiogram 08/06/2019  1. Left ventricular ejection fraction, by estimation, is 60 to 65%. The  left ventricle has normal function. The left ventricle has no regional  wall motion abnormalities. There is mild left ventricular hypertrophy.  Left ventricular diastolic parameters  are consistent with Grade I diastolic dysfunction (impaired relaxation).  2. Right ventricular systolic function is normal. The right ventricular  size is moderately enlarged.  3. Left atrial size was severely dilated.  4. Right atrial size was mildly dilated.  5. The mitral valve is normal in structure. Mild mitral valve  regurgitation. No evidence of mitral stenosis.  6. The aortic valve has an indeterminant number of cusps. Aortic valve  regurgitation is mild. Mild to moderate aortic valve stenosis.    _____________    Patient Profile     86 y.o. male w/ a h/o HTN, HL, CKD III, COVID-19 infxn in Winter 2020, PUD, recurrent GIB cancer, and urosepsis, who was admitted 8/19 from SNF w/ worsening dyspnea, abd pain, and AMS and found to have PNA and C diff colitis.  He has been treated for sepsis and in that  setting has had intermittent tachycardia concerning for SVT vs VT (baseline IVCD) w/ Afib on tele on 08/26/2019.  Assessment & Plan    1.  Aspiration PNA/sepsis:  Hemodynamically stable and steadily feeling better.  Breathing much better.  Afebrile w/ nl WBC. Abx per IM.  2.  C diff: per IM.  Diarrhea less freq today.  3.  Atrial arrhythmias:  Pt w/ freq PACs and occas PVCs.  I could not find any afib.  Review of 8/21 tele strip and surrounding rhythm that was concerning for afib also appears to be sinus w/ freq PACs.  HRs currently 90's to low 100's.  Not currently on AVN blocking agent in setting of soft bp's though bp's improving.  I will add low dose metoprolol today (12.5 bid).  Can consider outpt monitoring to reassess for afib. Poor OAC candidate in setting of anemia and h/o GIB.  Cont to supp K/Mg in setting of C diff.  4.  Elevated HsTroponin:  Minimally elevated.  No chest pain.  Echo earlier this month w/ nl EF and w/o RWMA.  No plan for ischemic eval at this time, though can reconsider as outpt.  Poor candidate for DAPT due to anemia/GIB.  Will add low dose  blocker as above.  5.  HFpEF:  Breathing stable.  Mild lower ext edema on exam.  + 1.8L since admission, though stool not recorded.  Wt stable.  HR/BP stable.  Will hold off on diuresis.  6.  Hypokalemia/Hypomagnesemia:  Supplementation ongoing.  7. Anemia/H/o GIB:  Stable.  Signed, Murray Hodgkins, NP  08/28/2019, 9:57 AM    For questions or updates, please contact   Please consult [email or web address] for contact info under Cardiology/STEMI.

## 2019-08-28 NOTE — Progress Notes (Signed)
Modified Barium Swallow Progress Note  Patient Details  Name: Albert [name] MRN: [id number] Date of Birth: 11/02/1933  Today's Date: 08/28/2019  Modified Barium Swallow completed.  Full report located under Chart Review in the Imaging Section.  Brief recommendations include the following:  Clinical Impression  Pt presents with severe sensori-motor oropharyngeal dysphagia. Pt is at an extremely high risk of aspiration. Pt's oral phase is c/b decreased lingual manipulation of nectar thick liquids, thin liquids and puree. This results in difficulty with posterior movement of boluses and premature spillage. Pt's pharyngeal phase is c/b swallow initiation at the pyriform sinuses, decreased epiglottic inversion, reduced anteriorn laryngeal mobility and reduced laryngeal elevation. This results in decreased glottal closure and significantly reduced cricopharyngus relaxation.  Pt aspirates the majority of boluses during the swallow and less than 10% of teaspoon bolus passes thru UES which results in significant pharyngeal residue in the vallecula, pyrifrom sinuses and residue at the cricopharyngeal segment (the residue here is what appears to present a globus sensation). All aspiration was silent except for 1 event but pt's was not able to clear the aspirates with coughing. Although puree doesn't enter his airway, he is at a HURGE risk of aspirating the residue left within his pharynx.   The eitology of pt's inadequate duration and amplitude of UES opening is unclear. To help with differential diagnosis of neuromuscular vs physiological eitology recommend consult to neurology and ENT. Given pt's high aspiration risk and decreased ability to maintain his nutritional status, recommend alternate nutrition and continued NPO status.     Swallow Evaluation Recommendations   Recommended Consults: Consider ENT evaluation (Neurology consult)   SLP Diet Recommendations: NPO       Medication Administration: Via  alternative means               Oral Care Recommendations: Oral care QID   Other Recommendations: Remove water pitcher;Prohibited food (jello, ice cream, thin soups);Have oral suction available   Muaz Shorey B. Rutherford Nail M.S., CCC-SLP, [place] [facility] [place] 08/28/2019,12:20 PM

## 2019-08-28 NOTE — Progress Notes (Signed)
Patient ID: Albert [name], male   DOB: April 25, 1933, 86 y.o.   MRN: [id number] [facility] PROGRESS NOTE  IZEAH VOSSLER CBJ:[id number] DOB: December 29, 1933 DOA: 08/24/2019 PCP: [name], MD  HPI/Subjective: Patient feeling okay.  Diarrhea is a little bit less.  Not having abdominal pain.  Still coughing a little bit.  Failed swallow evaluation today.  Objective: Vitals:   08/28/19 1153 08/28/19 1500  BP: 126/69 124/68  Pulse: (!) 102 (!) 105  Resp: 17 17  Temp: 97.9 F (36.6 C) 98.3 F (36.8 C)  SpO2: 100% 95%    Intake/Output Summary (Last 24 hours) at 08/28/2019 1706 Last data filed at 08/28/2019 1153 Gross per 24 hour  Intake 453.44 ml  Output 903 ml  Net -449.56 ml   Filed Weights   08/24/19 2053 08/27/19 0510 08/28/19 0426  Weight: 69.8 kg 69.5 kg 69.6 kg    ROS: Review of Systems  Respiratory: Negative for shortness of breath.   Cardiovascular: Negative for chest pain.  Gastrointestinal: Positive for abdominal pain and diarrhea. Negative for nausea and vomiting.   Exam: Physical Exam HENT:     Mouth/Throat:     Pharynx: No oropharyngeal exudate.  Eyes:     General: Lids are normal.     Conjunctiva/sclera: Conjunctivae normal.     Pupils: Pupils are equal, round, and reactive to light.  Cardiovascular:     Rate and Rhythm: Normal rate and regular rhythm.     Heart sounds: Normal heart sounds, S1 normal and S2 normal.  Pulmonary:     Breath sounds: Examination of the left-lower field reveals rhonchi. Rhonchi present. No decreased breath sounds, wheezing or rales.  Abdominal:     Palpations: Abdomen is soft.     Tenderness: There is no abdominal tenderness.  Musculoskeletal:     Right lower leg: No swelling.     Left lower leg: No swelling.  Skin:    General: Skin is warm.     Findings: No lesion.  Neurological:     Mental Status: He is alert.     Comments: Answers questions appropriately.       Data Reviewed: Basic Metabolic Panel: Recent  Labs  Lab 08/24/19 2037 08/25/19 0130 08/26/19 1423 08/27/19 0557 08/28/19 0416  NA 140 139 141 143 146*  K 4.5 4.5 3.8 3.4* 3.3*  CL 109 111 111 112* 118*  CO2 21* 20* 20* 18* 19*  GLUCOSE 111* 94 83 85 83  BUN 24* 24* 26* 23 21  CREATININE 1.21 1.13 1.16 1.13 1.01  CALCIUM 7.5* 7.3* 7.4* 7.3* 7.1*  MG  --   --  1.4*  --  1.6*   Liver Function Tests: Recent Labs  Lab 08/24/19 2037  AST 35  ALT 15  ALKPHOS 65  BILITOT 1.0  PROT 5.7*  ALBUMIN 2.2*   CBC: Recent Labs  Lab 08/24/19 2037 08/25/19 0130 08/26/19 0539 08/27/19 0557 08/28/19 0416  WBC 17.3* 14.6* 9.8 10.0 9.5  NEUTROABS 15.0*  --   --   --   --   HGB 9.1* 9.0* 8.2* 8.4* 8.3*  HCT 29.3* 29.1* 26.1* 26.4* 27.0*  MCV 92.7 94.2 91.6 91.3 91.8  PLT 246 207 176 193 214   BNP (last 3 results) Recent Labs    08/24/19 2037  BNP 728.7*    CBG: Recent Labs  Lab 08/26/19 0057 08/26/19 0147 08/26/19 0610  GLUCAP 77 88 69*    Recent Results (from the past 240 hour(s))  Urine culture  Status: None   Collection Time: 08/24/19  8:37 PM   Specimen: In/Out Cath Urine  Result Value Ref Range Status   Specimen Description   Final    IN/OUT CATH URINE Performed at [facility], [address] [place], [place] [address]    Special Requests   Final    NONE Performed at [facility], [address] [place], [place] [address]    Culture   Final    NO GROWTH Performed at [facility], [place] [address], [place], [place] [address]    Report Status 08/26/2019 FINAL  Final  SARS Coronavirus 2 by RT PCR (hospital order, performed in [facility] hospital lab) Nasopharyngeal Nasopharyngeal Swab     Status: None   Collection Time: 08/24/19  8:53 PM   Specimen: Nasopharyngeal Swab  Result Value Ref Range Status   SARS Coronavirus 2 NEGATIVE NEGATIVE Final    Comment: (NOTE) SARS-CoV-2 target nucleic acids are NOT DETECTED.  The SARS-CoV-2 RNA is generally detectable in upper and  lower respiratory specimens during the acute phase of infection. The lowest concentration of SARS-CoV-2 viral copies this assay can detect is 250 copies / mL. A negative result does not preclude SARS-CoV-2 infection and should not be used as the sole basis for treatment or other patient management decisions.  A negative result may occur with improper specimen collection / handling, submission of specimen other than nasopharyngeal swab, presence of viral mutation(s) within the areas targeted by this assay, and inadequate number of viral copies (<250 copies / mL). A negative result must be combined with clinical observations, patient history, and epidemiological information.  Fact Sheet for Patients:   StrictlyIdeas.no  Fact Sheet for Healthcare Providers: BankingDealers.co.za  This test is not yet approved or  cleared by the [place] FDA and has been authorized for detection and/or diagnosis of SARS-CoV-2 by FDA under an Emergency Use Authorization (EUA).  This EUA will remain in effect (meaning this test can be used) for the duration of the COVID-19 declaration under Section 564(b)(1) of the Act, 21 U.S.C. section 360bbb-3(b)(1), unless the authorization is terminated or revoked sooner.  Performed at [facility], [address] [place], [place] [address]   Blood Culture (routine x 2)     Status: None (Preliminary result)   Collection Time: 08/24/19  9:00 PM   Specimen: BLOOD  Result Value Ref Range Status   Specimen Description BLOOD BLOOD LEFT FOREARM  Final   Special Requests   Final    BOTTLES DRAWN AEROBIC AND ANAEROBIC Blood Culture results may not be optimal due to an excessive volume of blood received in culture bottles   Culture   Final    NO GROWTH 4 DAYS Performed at [facility], [address] [place], [place] [address]    Report Status PENDING  Incomplete  Blood Culture (routine x 2)      Status: None (Preliminary result)   Collection Time: 08/24/19  9:05 PM   Specimen: BLOOD  Result Value Ref Range Status   Specimen Description BLOOD BLOOD LEFT HAND  Final   Special Requests   Final    BOTTLES DRAWN AEROBIC AND ANAEROBIC Blood Culture adequate volume   Culture   Final    NO GROWTH 4 DAYS Performed at [facility], [address] [place], [place] [address]    Report Status PENDING  Incomplete  MRSA PCR Screening     Status: None   Collection Time: 08/25/19  10:33 PM   Specimen: Nasopharyngeal  Result Value Ref Range Status   MRSA by PCR NEGATIVE NEGATIVE Final    Comment:        The GeneXpert MRSA Assay (FDA approved for NASAL specimens only), is one component of a comprehensive MRSA colonization surveillance program. It is not intended to diagnose MRSA infection nor to guide or monitor treatment for MRSA infections. Performed at [facility], [place], [place] [address]   C Difficile Quick Screen w PCR reflex     Status: Abnormal   Collection Time: 08/26/19 10:22 AM   Specimen: STOOL  Result Value Ref Range Status   C Diff antigen POSITIVE (A) NEGATIVE Final   C Diff toxin POSITIVE (A) NEGATIVE Final   C Diff interpretation Toxin producing C. difficile detected.  Final    Comment: CRITICAL RESULT CALLED TO, READ BACK BY AND VERIFIED WITH: ANNIE RITCHIE AT 1128 08/26/19.PMF Performed at [facility], [address] [place], [place] [address]      Studies: DG Swallowing [facility] Pathology  Result Date: 08/28/2019 Objective Swallowing Evaluation: Type of Study: MBS-Modified Barium Swallow Study  Patient Details Name: DEMARIS LEAVELL MRN: [id number] Date of Birth: 07-02-1933 Today's Date: 08/28/2019 Time: SLP Start Time (ACUTE ONLY): 1601 -SLP Stop Time (ACUTE ONLY): 0935 SLP Time Calculation (min) (ACUTE ONLY): 30 min Past Medical History: Past Medical History: Diagnosis Date . Actinic keratosis 04/23/2008  L elbow  distal tricep (bx proven) . Basal cell carcinoma 06/17/2006  L preauricular  . Basal cell carcinoma 05/26/2012  L ant neck  . Basal cell carcinoma 11/02/2012  Glabella - excision  . Basal cell carcinoma 08/15/2014  L med ankle  . Basal cell carcinoma 02/23/2017  R lat infrapectoral - excision . Basal cell carcinoma 12/08/2017  L mid to distal lat volar forearm  . Hypertension  . Squamous cell carcinoma of skin 02/14/2014  R lat low back - ED&C . Squamous cell carcinoma of skin 10/02/2014  R prox lat bicep - ED&C Past Surgical History: Past Surgical History: Procedure Laterality Date . COLONOSCOPY N/A 08/05/2019  Procedure: COLONOSCOPY;  Surgeon: Toledo, Benay Pike, MD;  Location: [facility] ENDOSCOPY;  Service: Gastroenterology;  Laterality: N/A; . ESOPHAGOGASTRODUODENOSCOPY N/A 08/05/2019  Procedure: ESOPHAGOGASTRODUODENOSCOPY (EGD);  Surgeon: Toledo, Benay Pike, MD;  Location: [facility] ENDOSCOPY;  Service: Gastroenterology;  Laterality: N/A; HPI: YUNIEL BLANEY is a 86 y.o. male with PMH as noted below presents from his facility with fever and low blood pressure today.  Previously he had been admitted for upper GI bleed, EGD performed and pt diagnosed with esophageal and duodenal ulcers at the end of last month.  Chest x-ray indicates chronic appearing increased lung markings with mild right basilar infiltrate. ST consulted as pt reports difficulty swallowing.  Subjective: pt pleasant, conversant, fair historian Assessment / Plan / Recommendation CHL IP CLINICAL IMPRESSIONS 08/28/2019 Clinical Impression Pt presents with severe sensori-motor oropharyngeal dysphagia. Pt is at an extremely high risk of aspiration. Pt's oral phase is c/b decreased lingual manipulation of nectar thick liquids, thin liquids and puree. This results in difficulty with posterior movement of boluses and premature spillage. Pt's pharyngeal phase is c/b swallow initiation at the pyriform sinuses, decreased epiglottic inversion, reduced anteriorn laryngeal  mobility and reduced laryngeal elevation. This results in decreased glottal closure and significantly reduced cricopharyngus relaxation.  Pt aspirates the majority of boluses during the swallow and less than 10% of teaspoon bolus passes thru UES which results in significant pharyngeal residue  in the vallecula, pyrifrom sinuses and residue at the cricopharyngeal segment (the residue here is what appears to present a globus sensation). All aspiration was silent except for 1 event but pt's was not able to clear the aspirates with coughing. Although puree doesn't enter his airway, he is at a HURGE risk of aspirating the residue left within his pharynx. The eitology of pt's inadequate duration and amplitude of UES opening is unclear. To help with differential diagnosis of neuromuscular vs physiological eitology recommend consult to neurology and ENT. Given pt's high aspiration risk and decreased ability to maintain his nutritional status, recommend alternate nutrition and continued NPO status.   SLP Visit Diagnosis Dysphagia, pharyngoesophageal phase (R13.14) Attention and concentration deficit following -- Frontal lobe and executive function deficit following -- Impact on safety and function Severe aspiration risk;Risk for inadequate nutrition/hydration   CHL IP TREATMENT RECOMMENDATION 08/28/2019 Treatment Recommendations Therapy as outlined in treatment plan below   Prognosis 08/28/2019 Prognosis for Safe Diet Advancement Guarded Barriers to Reach Goals Severity of deficits Barriers/Prognosis Comment -- CHL IP DIET RECOMMENDATION 08/28/2019 SLP Diet Recommendations NPO Liquid Administration via -- Medication Administration Via alternative means Compensations -- Postural Changes --   CHL IP OTHER RECOMMENDATIONS 08/28/2019 Recommended Consults Consider ENT evaluation Oral Care Recommendations Oral care QID Other Recommendations Remove water pitcher;Prohibited food (jello, ice cream, thin soups);Have oral suction available    CHL IP FOLLOW UP RECOMMENDATIONS 08/28/2019 Follow up Recommendations (No Data)   CHL IP FREQUENCY AND DURATION 08/28/2019 Speech Therapy Frequency (ACUTE ONLY) min 2x/week Treatment Duration 2 weeks      CHL IP ORAL PHASE 08/28/2019 Oral Phase Impaired Oral - Pudding Teaspoon -- Oral - Pudding Cup -- Oral - Honey Teaspoon -- Oral - Honey Cup -- Oral - Nectar Teaspoon Weak lingual manipulation;Reduced posterior propulsion;Delayed oral transit;Piecemeal swallowing;Premature spillage Oral - Nectar Cup -- Oral - Nectar Straw -- Oral - Thin Teaspoon Weak lingual manipulation;Reduced posterior propulsion;Piecemeal swallowing;Delayed oral transit;Premature spillage Oral - Thin Cup -- Oral - Thin Straw Weak lingual manipulation;Reduced posterior propulsion;Piecemeal swallowing;Delayed oral transit;Decreased bolus cohesion;Premature spillage Oral - Puree Weak lingual manipulation;Reduced posterior propulsion;Delayed oral transit;Premature spillage Oral - Mech Soft -- Oral - Regular -- Oral - Multi-Consistency -- Oral - Pill -- Oral Phase - Comment --  CHL IP PHARYNGEAL PHASE 08/28/2019 Pharyngeal Phase Impaired Pharyngeal- Pudding Teaspoon -- Pharyngeal -- Pharyngeal- Pudding Cup -- Pharyngeal -- Pharyngeal- Honey Teaspoon -- Pharyngeal -- Pharyngeal- Honey Cup -- Pharyngeal -- Pharyngeal- Nectar Teaspoon Reduced epiglottic inversion;Reduced anterior laryngeal mobility;Reduced laryngeal elevation;Reduced airway/laryngeal closure;Penetration/Aspiration during swallow;Moderate aspiration;Pharyngeal residue - valleculae;Pharyngeal residue - pyriform;Pharyngeal residue - cp segment;Delayed swallow initiation-pyriform sinuses Pharyngeal Material enters airway, passes BELOW cords without attempt by patient to eject out (silent aspiration) Pharyngeal- Nectar Cup -- Pharyngeal -- Pharyngeal- Nectar Straw -- Pharyngeal -- Pharyngeal- Thin Teaspoon Delayed swallow initiation-pyriform sinuses;Reduced epiglottic inversion;Reduced anterior  laryngeal mobility;Reduced laryngeal elevation;Reduced airway/laryngeal closure;Penetration/Aspiration during swallow;Penetration/Apiration after swallow;Moderate aspiration;Pharyngeal residue - valleculae;Pharyngeal residue - pyriform;Pharyngeal residue - cp segment Pharyngeal Material enters airway, remains ABOVE vocal cords and not ejected out Pharyngeal- Thin Cup Delayed swallow initiation-pyriform sinuses;Reduced epiglottic inversion;Reduced anterior laryngeal mobility;Reduced laryngeal elevation;Reduced airway/laryngeal closure;Penetration/Aspiration during swallow;Moderate aspiration;Pharyngeal residue - valleculae;Pharyngeal residue - pyriform;Pharyngeal residue - cp segment Pharyngeal Material enters airway, passes BELOW cords without attempt by patient to eject out (silent aspiration) Pharyngeal- Thin Straw Delayed swallow initiation-pyriform sinuses;Reduced epiglottic inversion;Reduced anterior laryngeal mobility;Reduced laryngeal elevation;Reduced airway/laryngeal closure;Penetration/Aspiration during swallow;Penetration/Apiration after swallow;Moderate aspiration;Pharyngeal residue - valleculae;Pharyngeal residue - pyriform;Pharyngeal residue - cp segment  Pharyngeal Material enters airway, passes BELOW cords and not ejected out despite cough attempt by patient Pharyngeal- Puree Delayed swallow initiation-vallecula;Delayed swallow initiation-pyriform sinuses;Reduced pharyngeal peristalsis;Reduced epiglottic inversion;Reduced anterior laryngeal mobility;Reduced laryngeal elevation;Reduced airway/laryngeal closure;Pharyngeal residue - valleculae;Pharyngeal residue - pyriform;Pharyngeal residue - cp segment Pharyngeal Material does not enter airway Pharyngeal- Mechanical Soft -- Pharyngeal -- Pharyngeal- Regular -- Pharyngeal -- Pharyngeal- Multi-consistency -- Pharyngeal -- Pharyngeal- Pill -- Pharyngeal -- Pharyngeal Comment recommend neruology consult to assess for neuromuscular deficits  CHL IP CERVICAL  ESOPHAGEAL PHASE 08/28/2019 Cervical Esophageal Phase Impaired Pudding Teaspoon -- Pudding Cup -- Honey Teaspoon -- Honey Cup -- Nectar Teaspoon Reduced cricopharyngeal relaxation Nectar Cup -- Nectar Straw -- Thin Teaspoon Reduced cricopharyngeal relaxation Thin Cup Reduced cricopharyngeal relaxation Thin Straw Reduced cricopharyngeal relaxation Puree Reduced cricopharyngeal relaxation Mechanical Soft -- Regular -- Multi-consistency -- Pill -- Cervical Esophageal Comment recommend ENT consult to assess for physiological deficits Happi Overton 08/28/2019, 12:24 PM               Scheduled Meds: . enoxaparin (LOVENOX) injection  40 mg Subcutaneous Q24H  . metoprolol tartrate  12.5 mg Oral BID  . pantoprazole (PROTONIX) IV  40 mg Intravenous Q12H  . vancomycin (VANCOCIN) rectal ENEMA  500 mg Rectal Q6H   Continuous Infusions: . sodium chloride 10 mL (08/28/19 0604)  . ampicillin-sulbactam (UNASYN) IV 3 g (08/28/19 1304)  . dextrose 5 % and 0.9 % NaCl with KCl 20 mEq/L 40 mL/hr at 08/27/19 1141  . metronidazole      Assessment/Plan: 1. Sepsis, present on admission with hypotension.  This clinically has improved.  Patient has right lower pneumonia secondary to aspiration.  On Unasyn for this.  Case discussed with pharmacist to have short course for this 5 days.  Patient also has C. difficile colitis, since he failed the swallow evaluation will give IV metronidazole and rectal vancomycin enemas. 2. C. difficile colitis.  Start vancomycin enemas and IV Flagyl. 3. Dysphagia with aspiration on esophagram and modified barium swallow.  N.p.o. status.  Case discussed with ENT to see the patient this evening.  May end up needing GI to see if they can dilate upper esophageal sphincter.  Patient was adamant that he did not want a feeding tube.  We will get 4. Hyperlipidemia hold atorvastatin 5. Hypomagnesemia give IV magnesium 6. Hypokalemia replace potassium and IV fluids 7. Chronic kidney disease stage II.   Creatinine improved to 1.01 today 8. Iron deficiency anemia with prior blood loss.  Hemoglobin 8.3 today. 9. 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 Esophageal ulcer and esophagitis on IV Protonix with dysphagia.   Code Status:     Code Status Orders  (From admission, onward)         Start     Ordered   08/25/19 0015  Full code  Continuous        08/25/19 0014        Code Status History    Date Active Date Inactive Code Status Order ID Comments User Context   08/09/2019 2026 08/14/2019 2249 Full Code [id number]  Bradly Bienenstock, NP ED   08/04/2019 1827 08/08/2019 2341 Full Code [id number]  Georgette Shell, MD ED   08/04/2019 0533 08/04/2019 1826 Full Code [id number]  Vianne Bulls, MD ED   11/23/2018 2241 12/01/2018 1601 Full Code [id number]  Phillips Grout, MD Inpatient   11/22/2018 1732 11/23/2018 2212 Full Code [id number]  Para Skeans, MD ED   Advance Care  Planning Activity    Advance Directive Documentation     Most Recent Value  Type of Advance Directive Healthcare Power of Attorney  Pre-existing out of facility DNR order (yellow form or pink MOST form) --  "MOST" Form in Place? --     Family Communication: Spoke with [name] Disposition Plan: Status is: Inpatient  Dispo: The patient is from: Rehab              Anticipated d/c is to: Rehab              Anticipated d/c date is: Have to figure out how he is going to get nutrition              Patient currently failed swallow evaluation and does not want PEG feeding tube.  Antibiotics:  Rectal vancomycin  IV Flagyl  IV Unasyn  Time spent: 32 minutes, case discussed with speech therapy and ENT  [place]

## 2019-08-28 NOTE — Consult Note (Signed)
Albert [name], Albert [name] [id number] 04-Dec-1933 Loletha Grayer, MD  Reason for Consult: Evaluate his larynx and swallowing because of oropharyngeal dysphagia  HPI: The patient is an 86 year old white male who has had some symptoms of dysphagia going on for 20 years. And generally he takes his time swallowing and drinks a lot of liquids but eventually can work up to more solid foods at times. More recently he has had increased problems and a recent upper GI showed some lower esophageal ulcers and a hiatal hernia. Now in the last 2 weeks has had more problems with significant dysphagia and feeling like he is choking on his food all the time. He had a barium swallow attempted on Saturday 2 days ago, and he had immediate aspiration. A modified barium swallow was done today with speech therapy which showed penetration without cough with thin liquids and nectar thick liquids. He did not aspirate with pured foods but had a difficult time getting any down because he did not have relaxation of his upper esophageal sphincter. Is recommended to remain n.p.o. for now and evaluate for anatomic problems and or neurologic issues. The patient has been asked about a feeding tube but has been pretty adamant that he does not want 1. He is not having any pain in his throat right now.  Allergies: No Active Allergies  ROS: Review of systems normal other than 12 systems except per HPI.  PMH:  Past Medical History:  Diagnosis Date  . Actinic keratosis 04/23/2008   L elbow distal tricep (bx proven)  . Basal cell carcinoma 06/17/2006   L preauricular   . Basal cell carcinoma 05/26/2012   L ant neck   . Basal cell carcinoma 11/02/2012   Glabella - excision   . Basal cell carcinoma 08/15/2014   L med ankle   . Basal cell carcinoma 02/23/2017   R lat infrapectoral - excision  . Basal cell carcinoma 12/08/2017   L mid to distal lat volar forearm   . Hypertension   . Squamous cell carcinoma of skin 02/14/2014   R lat low back  - ED&C  . Squamous cell carcinoma of skin 10/02/2014   R prox lat bicep - ED&C    FH: No family history on file.  SH:  Social History   Socioeconomic History  . Marital status: Widowed    Spouse name: Not on file  . Number of children: Not on file  . Years of education: Not on file  . Highest education level: Not on file  Occupational History  . Not on file  Tobacco Use  . Smoking status: Never Smoker  . Smokeless tobacco: Never Used  Vaping Use  . Vaping Use: Never used  Substance and Sexual Activity  . Alcohol use: Not Currently  . Drug use: Never  . Sexual activity: Not on file  Other Topics Concern  . Not on file  Social History Narrative  . Not on file   Social Determinants of Health   Financial Resource Strain:   . Difficulty of Paying Living Expenses: Not on file  Food Insecurity:   . Worried About Charity fundraiser in the Last Year: Not on file  . Ran Out of Food in the Last Year: Not on file  Transportation Needs:   . Lack of Transportation (Medical): Not on file  . Lack of Transportation (Non-Medical): Not on file  Physical Activity:   . Days of Exercise per Week: Not on file  . Minutes of Exercise per Session:  Not on file  Stress:   . Feeling of Stress : Not on file  Social Connections:   . Frequency of Communication with Friends and Family: Not on file  . Frequency of Social Gatherings with Friends and Family: Not on file  . Attends Religious Services: Not on file  . Active Member of Clubs or Organizations: Not on file  . Attends Archivist Meetings: Not on file  . Marital Status: Not on file  Intimate Partner Violence:   . Fear of Current or Ex-Partner: Not on file  . Emotionally Abused: Not on file  . Physically Abused: Not on file  . Sexually Abused: Not on file    PSH:  Past Surgical History:  Procedure Laterality Date  . COLONOSCOPY N/A 08/05/2019   Procedure: COLONOSCOPY;  Surgeon: Toledo, Benay Pike, MD;  Location: [facility]  ENDOSCOPY;  Service: Gastroenterology;  Laterality: N/A;  . ESOPHAGOGASTRODUODENOSCOPY N/A 08/05/2019   Procedure: ESOPHAGOGASTRODUODENOSCOPY (EGD);  Surgeon: Toledo, Benay Pike, MD;  Location: [facility] ENDOSCOPY;  Service: Gastroenterology;  Laterality: N/A;    Physical  Exam: Patient is awake and alert very cooperative. His voice is clear and he seems to be a pretty good historian. His nose is open and clear. His oropharynx does not show any lesions. He is edentulous. He seems to have good tongue mobility and can stick his tongue out of his mouth. His neck does not reveal any nodes or masses that I can feel.  Flexible laryngoscopy was done to evaluate the hypopharynx and larynx. This is dictated in detail elsewhere. This does not show any anatomic abnormalities or any evidence for tumors and or infection..   A/P: Patient has had worsening oropharyngeal dysphagia with limited ability to manage his liquids orally as well as completing a swallow. His upper esophageal sphincter does not seem to relax and so he has very little that goes through his upper esophagus and the rest pools in the piriform and rolls over into the larynx causing aspiration. Some of this could be a neuromuscular problem that is showing up now, although that would be uncommon. He could have some tightening of his upper esophageal sphincter that it does not relax leading to the food bolus not being able to get into the esophagus. This could have come from a pill hanging up at the upper esophagus sphincter and causing an ulceration here. It may be helpful for Dr. Alice Reichert to reevaluate his upper esophagus to see if the lower sphincter has healed some and to dilate the upper esophageal sphincter to see if that makes any difference. If the sphincter is not tight and there is no sign of any mucosal irritation inside the upper esophageal sphincter that may add to problems, then is more likely a neurologic problem that is involving his bulbar  muscles.  I spoke to him about placing a temporary nasal tube into the stomach for feeding or possibly a temporary PEG tube for feeding instead.  I stressed these were temporary to help get him nutrition to we could figure out what was going on and get him back hopefully where he could swallow again.  He seems to be thinking about it is explained to him how important it is that he did not take anything by mouth because it would lead to further aspiration and pneumonia.   Huey Romans 08/28/2019 7:57 PM

## 2019-08-28 NOTE — Op Note (Signed)
08/28/2019  7:52 PM    Sharyn Lull  [id number]   Pre-Op Dx: Oropharyngeal dysphagia  Post-op Dx: Oropharyngeal dysphagia with no anatomic abnormalities or signs of infection  Proc: Flexible laryngoscopy  Surg:  Elon Alas Elai Vanwyk  Anes:  GOT  EBL: None  Comp: None  Findings: Normal anatomy  Procedure: Patient was seen at the bedside and 4% Xylocaine mixed with phenylephrine was used to topically sprayed the nasal mucosa for vasoconstriction and anesthesia. The flexible scope was passed through his right nostril at the base which was decongested and wide open and clear. His nasopharynx was clear there is no sign of any mucosal lesions here. The hypopharynx showed normal tongue base. He could stick his tongue out and extend that normally. He normal epiglottis that was elevated and I could see his vocal cords well. He had good mobility of his cords to close to the midline and opened widely. He did not show any lesions on his cords and there were no lesions noted in the entire hypopharynx anywhere. There is no redness or swelling or any kind of inflammation anywhere.  Dispo:   This was done at the bedside. There is no recovery needed  Plan: The patient will remain n.p.o. for now until further work-up shows a reason for his oropharyngeal dysphasia  Huey Romans  08/28/2019 7:52 PM

## 2019-08-28 NOTE — Care Management Important Message (Signed)
Important Message  Patient Details  Name: Albert [name] MRN: [id number] Date of Birth: 02/20/1933   Medicare Important Message Given:  N/A - LOS <3 / Initial given by admissions  Initial Medicare IM given by Patient Access Associate on 08/27/2019 at 10:30am.   Dannette Barbara 08/28/2019, 10:04 AM

## 2019-08-28 NOTE — Progress Notes (Signed)
Physical Therapy Treatment Patient Details Name: Albert [name] MRN: [id number] DOB: Sep 14, 1933 Today's Date: 08/28/2019    History of Present Illness Albert [name] is 86 y.o. male with PMH of CKD, HTN, HLD with recent admission for GI bleed and urosepsis presents from SNF with concerns for fever and hypotensive. Admitted for sepsis and aspiration pneunonia. Also with C. diff. Wide-complex tachycardia secondary to sepsis. Several recent hospitalizations.    PT Comments    Ready for session.  Participated in exercises as described below.  He makes good effort to get to EOB but still needs mod a x 1 to complete.  Once sitting he is generally steady x 10 minutes.  He is able to LAQ x 20 without LOB but unable to march in sitting.  He does have one post LOB when fatigued with min a x 1 to recover.  He lays back down due to fatigue with max  X 1.  During session he does have medium loose/liquid BM and care is provided as appropriate and barrier cream applied.  Pt pleased with his progress today.  SNF will still be necessary upon discharge.   Follow Up Recommendations  SNF     Equipment Recommendations  None recommended by PT    Recommendations for Other Services       Precautions / Restrictions Precautions Precautions: Fall;Other (comment) Precaution Comments: C.Diff Restrictions Weight Bearing Restrictions: No Other Position/Activity Restrictions: skin rash/sensitivity    Mobility  Bed Mobility Overal bed mobility: Needs Assistance Bed Mobility: Rolling Rolling: Mod assist   Supine to sit: Mod assist Sit to supine: Max assist   General bed mobility comments: able to transition and remains sitting x 10 minutes with +1 assist today  Transfers                 General transfer comment: uanble  Ambulation/Gait                 Stairs             Wheelchair Mobility    Modified Rankin (Stroke Patients Only)       Balance Overall balance  assessment: Needs assistance Sitting-balance support: Feet unsupported;Bilateral upper extremity supported Sitting balance-Leahy Scale: Fair Sitting balance - Comments: sat x 10 minutes with min gaurd/supervision.  One post LOB when fatigued.                                    Cognition Arousal/Alertness: Awake/alert Behavior During Therapy: WFL for tasks assessed/performed Overall Cognitive Status: Within Functional Limits for tasks assessed                                 General Comments: alert and orientated but needs repeated questions at times.      Exercises Other Exercises Other Exercises: ankle and quad pumps, SLR, heel slides and ab/add x 10 A/AAROM    General Comments        Pertinent Vitals/Pain Pain Assessment: Faces Faces Pain Scale: Hurts a little bit Pain Location: back/skin Pain Descriptors / Indicators: Grimacing;Tender Pain Intervention(s): Limited activity within patient's tolerance    Home Living                      Prior Function            PT Goals (current  goals can now be found in the care plan section) Progress towards PT goals: Progressing toward goals    Frequency    Min 2X/week      PT Plan Current plan remains appropriate    Co-evaluation              AM-PAC PT "6 Clicks" Mobility   Outcome Measure  Help needed turning from your back to your side while in a flat bed without using bedrails?: A Lot Help needed moving from lying on your back to sitting on the side of a flat bed without using bedrails?: A Lot Help needed moving to and from a bed to a chair (including a wheelchair)?: Total Help needed standing up from a chair using your arms (e.g., wheelchair or bedside chair)?: Total Help needed to walk in hospital room?: Total Help needed climbing 3-5 steps with a railing? : Total 6 Click Score: 8    End of Session Equipment Utilized During Treatment: Gait belt Activity Tolerance:  Patient tolerated treatment well Patient left: with call bell/phone within reach;with bed alarm set;in bed Nurse Communication: Mobility status Pain - Right/Left: Right Pain - part of body: Knee     Time: 1583-0940 PT Time Calculation (min) (ACUTE ONLY): 26 min  Charges:  $Therapeutic Exercise: 8-22 mins $Therapeutic Activity: 8-22 mins                    Chesley Noon, PTA 08/28/19, 1:30 PM

## 2019-08-28 NOTE — Plan of Care (Signed)

## 2019-08-29 ENCOUNTER — Inpatient Hospital Stay: Payer: Medicare Other

## 2019-08-29 ENCOUNTER — Encounter: Payer: Self-pay | Admitting: Internal Medicine

## 2019-08-29 DIAGNOSIS — Z7189 Other specified counseling: Secondary | ICD-10-CM

## 2019-08-29 LAB — CK: Total CK: 45 U/L — ABNORMAL LOW (ref 49–397)

## 2019-08-29 LAB — CULTURE, BLOOD (ROUTINE X 2)
Culture: NO GROWTH
Culture: NO GROWTH
Special Requests: ADEQUATE

## 2019-08-29 LAB — SEDIMENTATION RATE: Sed Rate: 65 mm/hr — ABNORMAL HIGH (ref 0–20)

## 2019-08-29 MED ORDER — DIATRIZOATE MEGLUMINE & SODIUM 66-10 % PO SOLN
30.0000 mL | Freq: Once | ORAL | Status: AC
  Administered 2019-08-29: 30 mL via ORAL

## 2019-08-29 MED ORDER — KCL IN DEXTROSE-NACL 20-5-0.45 MEQ/L-%-% IV SOLN
INTRAVENOUS | Status: DC
  Filled 2019-08-29 (×11): qty 1000

## 2019-08-29 MED ORDER — VANCOMYCIN 50 MG/ML ORAL SOLUTION
125.0000 mg | Freq: Four times a day (QID) | ORAL | Status: AC
  Administered 2019-08-29 – 2019-09-06 (×32): 125 mg
  Filled 2019-08-29 (×40): qty 2.5

## 2019-08-29 MED ORDER — IOHEXOL 9 MG/ML PO SOLN: 500.0000 mL | ORAL | Status: AC

## 2019-08-29 MED ORDER — GADOBUTROL 1 MMOL/ML IV SOLN
6.0000 mL | Freq: Once | INTRAVENOUS | Status: AC | PRN
  Administered 2019-08-29: 6 mL via INTRAVENOUS

## 2019-08-29 MED ORDER — OSMOLITE 1.5 CAL PO LIQD
237.0000 mL | Freq: Once | ORAL | Status: AC
  Administered 2019-08-29: 237 mL

## 2019-08-29 NOTE — H&P (Signed)
Chief Complaint: Dysphagia  Referring Physician(s): Leslye Peer, Richard  Supervising Physician: Markus Daft  Patient Status: [facility] - In-pt  History of Present Illness: Albert [name] is a 86 y.o. male with medical history significant forhypertension, hyperlipidemia,  osteopenia, chronic kidney disease stage IIIa, chronic pain, recent GI bleed, and atrial arrhthymias.  He was admitted on 7/29 through 8/3 for GI bleeding due to esophageal and duodenal ulcers the again on 8/4 through 8/9 for concern for recurrent GI bleed and hypotension.    He was treated with broad-spectrum antibiotics for 7-day course.   Most recently is amitted on 8/19 with fever and hypotension.    Work-up has shown concern for aspiration pneumonia and C. difficile colitis.   Albert [name] also reports a 20 pound weight loss in the past few months.  He underwent a swallow evaluation which showed = Pt aspirates the majority of boluses during the swallow and less than 10% of teaspoon bolus passes thru UES which results in significant pharyngeal residue in the vallecula, pyrifrom sinuses and residue at the cricopharyngeal segment (the residue here is what appears to present a globus sensation). All aspiration was silent except for 1 event but pt's was not able to clear the aspirates with coughing. Although puree doesn't enter his airway, he is at a HUGE risk of aspirating the residue left within his pharynx.   We are asked to evaluate him for placement of a gastrostomy tube.  Per patient, he is having several watery stools per day due to his C diff.  CTA of the abdomen which was done 08/09/19 was reviewed by Dr. Anselm Pancoast to evaluate his anatomy. The colon does overlie the stomach to a certain degree and the patient would require contrast enema during procedure in order to visualize the colon and avoid injury.   Past Medical History:  Diagnosis Date  . Actinic keratosis 04/23/2008   L elbow distal tricep (bx proven)  .  Basal cell carcinoma 06/17/2006   L preauricular   . Basal cell carcinoma 05/26/2012   L ant neck   . Basal cell carcinoma 11/02/2012   Glabella - excision   . Basal cell carcinoma 08/15/2014   L med ankle   . Basal cell carcinoma 02/23/2017   R lat infrapectoral - excision  . Basal cell carcinoma 12/08/2017   L mid to distal lat volar forearm   . Hypertension   . Squamous cell carcinoma of skin 02/14/2014   R lat low back - ED&C  . Squamous cell carcinoma of skin 10/02/2014   R prox lat bicep - ED&C    Past Surgical History:  Procedure Laterality Date  . COLONOSCOPY N/A 08/05/2019   Procedure: COLONOSCOPY;  Surgeon: Toledo, Benay Pike, MD;  Location: [facility] ENDOSCOPY;  Service: Gastroenterology;  Laterality: N/A;  . ESOPHAGOGASTRODUODENOSCOPY N/A 08/05/2019   Procedure: ESOPHAGOGASTRODUODENOSCOPY (EGD);  Surgeon: Toledo, Benay Pike, MD;  Location: [facility] ENDOSCOPY;  Service: Gastroenterology;  Laterality: N/A;    Allergies: Patient has no active allergies.  Medications: Prior to Admission medications   Medication Sig Start Date End Date Taking? Authorizing Provider  aspirin EC 81 MG tablet Take 1 tablet (81 mg total) by mouth daily. Swallow whole.start 08/19/19 08/08/19 08/07/20 Yes Georgette Shell, MD  atorvastatin (LIPITOR) 20 MG tablet Take 20 mg by mouth daily. 11/20/18  Yes [provider]  hydrochlorothiazide (HYDRODIURIL) 25 MG tablet Take 25 mg by mouth daily.   Yes [provider]  Multiple Vitamin (MULTIVITAMIN WITH MINERALS)  TABS tablet Take 1 tablet by mouth daily. 08/09/19  Yes Georgette Shell, MD  ondansetron (ZOFRAN) 4 MG tablet Take 1 tablet (4 mg total) by mouth every 6 (six) hours as needed for nausea. 08/08/19  Yes Georgette Shell, MD  pantoprazole (PROTONIX) 40 MG tablet Take 1 tablet (40 mg total) by mouth daily. 08/09/19  Yes Georgette Shell, MD  celecoxib (CELEBREX) 200 MG capsule Take 200 mg by mouth daily.  Patient not taking:  Reported on 08/25/2019    [provider]     No family history on file.  Social History   Socioeconomic History  . Marital status: Widowed    Spouse name: Not on file  . Number of children: Not on file  . Years of education: Not on file  . Highest education level: Not on file  Occupational History  . Not on file  Tobacco Use  . Smoking status: Never Smoker  . Smokeless tobacco: Never Used  Vaping Use  . Vaping Use: Never used  Substance and Sexual Activity  . Alcohol use: Not Currently  . Drug use: Never  . Sexual activity: Not on file  Other Topics Concern  . Not on file  Social History Narrative  . Not on file   Social Determinants of Health   Financial Resource Strain:   . Difficulty of Paying Living Expenses: Not on file  Food Insecurity:   . Worried About Charity fundraiser in the Last Year: Not on file  . Ran Out of Food in the Last Year: Not on file  Transportation Needs:   . Lack of Transportation (Medical): Not on file  . Lack of Transportation (Non-Medical): Not on file  Physical Activity:   . Days of Exercise per Week: Not on file  . Minutes of Exercise per Session: Not on file  Stress:   . Feeling of Stress : Not on file  Social Connections:   . Frequency of Communication with Friends and Family: Not on file  . Frequency of Social Gatherings with Friends and Family: Not on file  . Attends Religious Services: Not on file  . Active Member of Clubs or Organizations: Not on file  . Attends Archivist Meetings: Not on file  . Marital Status: Not on file     Review of Systems: A 12 point ROS discussed and pertinent positives are indicated in the HPI above.  All other systems are negative.  Review of Systems  Vital Signs: BP 122/77 (BP Location: Right Arm)   Pulse 89   Temp 97.8 F (36.6 C)   Resp 18   Ht 5\' 4"  (1.626 m)   Wt 69.2 kg   SpO2 98%   BMI 26.18 kg/m   Physical Exam Vitals reviewed.  Constitutional:       Appearance: Normal appearance.  HENT:     Head: Normocephalic and atraumatic.  Eyes:     Extraocular Movements: Extraocular movements intact.  Cardiovascular:     Rate and Rhythm: Normal rate and regular rhythm.  Pulmonary:     Effort: Pulmonary effort is normal. No respiratory distress.     Breath sounds: Normal breath sounds.  Abdominal:     General: There is no distension.     Palpations: Abdomen is soft.     Tenderness: There is no abdominal tenderness.     Comments: No scars  Musculoskeletal:        General: Normal range of motion.  Cervical back: Normal range of motion.     Comments: Thoracic kyphosis present  Skin:    General: Skin is warm and dry.  Neurological:     General: No focal deficit present.     Mental Status: He is alert and oriented to person, place, and time.  Psychiatric:        Mood and Affect: Mood normal.        Behavior: Behavior normal.        Thought Content: Thought content normal.        Judgment: Judgment normal.     Imaging: US RENAL  Result Date: 08/04/2019 CLINICAL DATA:  Acute on chronic renal failure EXAM: RENAL / URINARY TRACT ULTRASOUND COMPLETE COMPARISON:  None. FINDINGS: Right Kidney: Renal measurements: 8.6 x 4.0 x 3.7 cm = volume: 67 mL. Mildly increased echogenicity seen throughout. A 1.4 cm shadowing calculus seen within the midpole. No hydronephrosis. Left Kidney: Renal measurements: 8.9 x 3.8 x 3.6 cm. = volume: 63 mL. Mildly increased echogenicity seen throughout. No mass or hydronephrosis visualized. Bladder: Appears normal for degree of bladder distention. Other: Mildly enlarged prostate gland is seen. IMPRESSION: Nonobstructing right renal calculus. Diffusely increased parenchymal echogenicity, consistent with medical renal disease. Electronically Signed   By: Prudencio Pair M.D.   On: 08/04/2019 01:33   DG Chest Port 1 View  Result Date: 08/24/2019 CLINICAL DATA:  Fever and tachycardia. EXAM: PORTABLE CHEST 1 VIEW COMPARISON:   August 09, 2019 FINDINGS: Mild, diffuse chronic appearing increased lung markings are seen. Mild right basilar infiltrate is present. There is no evidence of a pleural effusion or pneumothorax. The cardiac silhouette is mildly enlarged. Moderate severity calcification of the aortic arch is noted. The visualized skeletal structures are unremarkable. IMPRESSION: 1. Chronic appearing increased lung markings with mild right basilar infiltrate. Electronically Signed   By: Virgina Norfolk M.D.   On: 08/24/2019 21:00   DG Chest Port 1 View  Result Date: 08/09/2019 CLINICAL DATA:  Sepsis. EXAM: PORTABLE CHEST 1 VIEW COMPARISON:  August 03, 2019 FINDINGS: There is a new small right-sided pleural effusion. There are bibasilar airspace opacities favored to represent areas of atelectasis. The heart size is unchanged. There are aortic calcifications. There is no pneumothorax. IMPRESSION: 1. New small right-sided pleural effusion. 2. Bibasilar airspace opacities favored to represent areas of atelectasis. Electronically Signed   By: Constance Holster M.D.   On: 08/09/2019 17:32   DG Chest Portable 1 View  Result Date: 08/03/2019 CLINICAL DATA:  Weakness EXAM: PORTABLE CHEST 1 VIEW COMPARISON:  11/22/2018 FINDINGS: No focal opacity or pleural effusion. Probable scarring in the left upper lung. Normal cardiomediastinal silhouette with aortic atherosclerosis. No pneumothorax. IMPRESSION: No active disease. Electronically Signed   By: Donavan Foil M.D.   On: 08/03/2019 23:20   DG Swallowing Func-Speech Pathology  Result Date: 08/28/2019 Objective Swallowing Evaluation: Type of Study: MBS-Modified Barium Swallow Study  Patient Details Name: Albert [name] MRN: [id number] Date of Birth: [birth year] Today's Date: 08/28/2019 Time: SLP Start Time (ACUTE ONLY): 6269 -SLP Stop Time (ACUTE ONLY): 0935 SLP Time Calculation (min) (ACUTE ONLY): 30 min Past Medical History: Past Medical History: Diagnosis Date . Actinic keratosis  04/23/2008  L elbow distal tricep (bx proven) . Basal cell carcinoma 06/17/2006  L preauricular  . Basal cell carcinoma 05/26/2012  L ant neck  . Basal cell carcinoma 11/02/2012  Glabella - excision  . Basal cell carcinoma 08/15/2014  L med ankle  . Basal cell carcinoma 02/23/2017  R lat infrapectoral - excision . Basal cell carcinoma 12/08/2017  L mid to distal lat volar forearm  . Hypertension  . Squamous cell carcinoma of skin 02/14/2014  R lat low back - ED&C . Squamous cell carcinoma of skin 10/02/2014  R prox lat bicep - ED&C Past Surgical History: Past Surgical History: Procedure Laterality Date . COLONOSCOPY N/A 08/05/2019  Procedure: COLONOSCOPY;  Surgeon: Toledo, Benay Pike, MD;  Location: [facility]OSCOPY;  Service: Gastroenterology;  Laterality: N/A; . ESOPHAGOGASTRODUODENOSCOPY N/A 08/05/2019  Procedure: ESOPHAGOGASTRODUODENOSCOPY (EGD);  Surgeon: Toledo, Benay Pike, MD;  Location: [facility] ENDOSCOPY;  Service: Gastroenterology;  Laterality: N/A; HPI: IZYAN EZZELL is a 86 y.o. male with PMH as noted below presents from his facility with fever and low blood pressure today.  Previously he had been admitted for upper GI bleed, EGD performed and pt diagnosed with esophageal and duodenal ulcers at the end of last month.  Chest x-ray indicates chronic appearing increased lung markings with mild right basilar infiltrate. ST consulted as pt reports difficulty swallowing.  Subjective: pt pleasant, conversant, fair historian Assessment / Plan / Recommendation CHL IP CLINICAL IMPRESSIONS 08/28/2019 Clinical Impression Pt presents with severe sensori-motor oropharyngeal dysphagia. Pt is at an extremely high risk of aspiration. Pt's oral phase is c/b decreased lingual manipulation of nectar thick liquids, thin liquids and puree. This results in difficulty with posterior movement of boluses and premature spillage. Pt's pharyngeal phase is c/b swallow initiation at the pyriform sinuses, decreased epiglottic inversion, reduced  anteriorn laryngeal mobility and reduced laryngeal elevation. This results in decreased glottal closure and significantly reduced cricopharyngus relaxation.  Pt aspirates the majority of boluses during the swallow and less than 10% of teaspoon bolus passes thru UES which results in significant pharyngeal residue in the vallecula, pyrifrom sinuses and residue at the cricopharyngeal segment (the residue here is what appears to present a globus sensation). All aspiration was silent except for 1 event but pt's was not able to clear the aspirates with coughing. Although puree doesn't enter his airway, he is at a HURGE risk of aspirating the residue left within his pharynx. The eitology of pt's inadequate duration and amplitude of UES opening is unclear. To help with differential diagnosis of neuromuscular vs physiological eitology recommend consult to neurology and ENT. Given pt's high aspiration risk and decreased ability to maintain his nutritional status, recommend alternate nutrition and continued NPO status.   SLP Visit Diagnosis Dysphagia, pharyngoesophageal phase (R13.14) Attention and concentration deficit following -- Frontal lobe and executive function deficit following -- Impact on safety and function Severe aspiration risk;Risk for inadequate nutrition/hydration   CHL IP TREATMENT RECOMMENDATION 08/28/2019 Treatment Recommendations Therapy as outlined in treatment plan below   Prognosis 08/28/2019 Prognosis for Safe Diet Advancement Guarded Barriers to Reach Goals Severity of deficits Barriers/Prognosis Comment -- CHL IP DIET RECOMMENDATION 08/28/2019 SLP Diet Recommendations NPO Liquid Administration via -- Medication Administration Via alternative means Compensations -- Postural Changes --   CHL IP OTHER RECOMMENDATIONS 08/28/2019 Recommended Consults Consider ENT evaluation Oral Care Recommendations Oral care QID Other Recommendations Remove water pitcher;Prohibited food (jello, ice cream, thin soups);Have  oral suction available   CHL IP FOLLOW UP RECOMMENDATIONS 08/28/2019 Follow up Recommendations (No Data)   CHL IP FREQUENCY AND DURATION 08/28/2019 Speech Therapy Frequency (ACUTE ONLY) min 2x/week Treatment Duration 2 weeks      CHL IP ORAL PHASE 08/28/2019 Oral Phase Impaired Oral - Pudding Teaspoon -- Oral - Pudding Cup -- Oral - Honey Teaspoon -- Oral - Honey  Cup -- Oral - Nectar Teaspoon Weak lingual manipulation;Reduced posterior propulsion;Delayed oral transit;Piecemeal swallowing;Premature spillage Oral - Nectar Cup -- Oral - Nectar Straw -- Oral - Thin Teaspoon Weak lingual manipulation;Reduced posterior propulsion;Piecemeal swallowing;Delayed oral transit;Premature spillage Oral - Thin Cup -- Oral - Thin Straw Weak lingual manipulation;Reduced posterior propulsion;Piecemeal swallowing;Delayed oral transit;Decreased bolus cohesion;Premature spillage Oral - Puree Weak lingual manipulation;Reduced posterior propulsion;Delayed oral transit;Premature spillage Oral - Mech Soft -- Oral - Regular -- Oral - Multi-Consistency -- Oral - Pill -- Oral Phase - Comment --  CHL IP PHARYNGEAL PHASE 08/28/2019 Pharyngeal Phase Impaired Pharyngeal- Pudding Teaspoon -- Pharyngeal -- Pharyngeal- Pudding Cup -- Pharyngeal -- Pharyngeal- Honey Teaspoon -- Pharyngeal -- Pharyngeal- Honey Cup -- Pharyngeal -- Pharyngeal- Nectar Teaspoon Reduced epiglottic inversion;Reduced anterior laryngeal mobility;Reduced laryngeal elevation;Reduced airway/laryngeal closure;Penetration/Aspiration during swallow;Moderate aspiration;Pharyngeal residue - valleculae;Pharyngeal residue - pyriform;Pharyngeal residue - cp segment;Delayed swallow initiation-pyriform sinuses Pharyngeal Material enters airway, passes BELOW cords without attempt by patient to eject out (silent aspiration) Pharyngeal- Nectar Cup -- Pharyngeal -- Pharyngeal- Nectar Straw -- Pharyngeal -- Pharyngeal- Thin Teaspoon Delayed swallow initiation-pyriform sinuses;Reduced epiglottic  inversion;Reduced anterior laryngeal mobility;Reduced laryngeal elevation;Reduced airway/laryngeal closure;Penetration/Aspiration during swallow;Penetration/Apiration after swallow;Moderate aspiration;Pharyngeal residue - valleculae;Pharyngeal residue - pyriform;Pharyngeal residue - cp segment Pharyngeal Material enters airway, remains ABOVE vocal cords and not ejected out Pharyngeal- Thin Cup Delayed swallow initiation-pyriform sinuses;Reduced epiglottic inversion;Reduced anterior laryngeal mobility;Reduced laryngeal elevation;Reduced airway/laryngeal closure;Penetration/Aspiration during swallow;Moderate aspiration;Pharyngeal residue - valleculae;Pharyngeal residue - pyriform;Pharyngeal residue - cp segment Pharyngeal Material enters airway, passes BELOW cords without attempt by patient to eject out (silent aspiration) Pharyngeal- Thin Straw Delayed swallow initiation-pyriform sinuses;Reduced epiglottic inversion;Reduced anterior laryngeal mobility;Reduced laryngeal elevation;Reduced airway/laryngeal closure;Penetration/Aspiration during swallow;Penetration/Apiration after swallow;Moderate aspiration;Pharyngeal residue - valleculae;Pharyngeal residue - pyriform;Pharyngeal residue - cp segment Pharyngeal Material enters airway, passes BELOW cords and not ejected out despite cough attempt by patient Pharyngeal- Puree Delayed swallow initiation-vallecula;Delayed swallow initiation-pyriform sinuses;Reduced pharyngeal peristalsis;Reduced epiglottic inversion;Reduced anterior laryngeal mobility;Reduced laryngeal elevation;Reduced airway/laryngeal closure;Pharyngeal residue - valleculae;Pharyngeal residue - pyriform;Pharyngeal residue - cp segment Pharyngeal Material does not enter airway Pharyngeal- Mechanical Soft -- Pharyngeal -- Pharyngeal- Regular -- Pharyngeal -- Pharyngeal- Multi-consistency -- Pharyngeal -- Pharyngeal- Pill -- Pharyngeal -- Pharyngeal Comment recommend neruology consult to assess for  neuromuscular deficits  CHL IP CERVICAL ESOPHAGEAL PHASE 08/28/2019 Cervical Esophageal Phase Impaired Pudding Teaspoon -- Pudding Cup -- Honey Teaspoon -- Honey Cup -- Nectar Teaspoon Reduced cricopharyngeal relaxation Nectar Cup -- Nectar Straw -- Thin Teaspoon Reduced cricopharyngeal relaxation Thin Cup Reduced cricopharyngeal relaxation Thin Straw Reduced cricopharyngeal relaxation Puree Reduced cricopharyngeal relaxation Mechanical Soft -- Regular -- Multi-consistency -- Pill -- Cervical Esophageal Comment recommend ENT consult to assess for physiological deficits Albert [name] 08/28/2019, 12:24 PM              ECHOCARDIOGRAM COMPLETE  Result Date: 08/06/2019    ECHOCARDIOGRAM REPORT   Patient Name:   Albert [name] Date of Exam: 08/06/2019 Medical Rec #:  [id number]       Height:       64.0 in Accession #:    [id number]      Weight:       132.3 lb Date of Birth:  12-30-1933        BSA:          1.641 m Patient Age:    17 years        BP:           101/65 mmHg Patient Gender: M  HR:           77 bpm. Exam Location:  [facility] Procedure: 2D Echo Indications:     ABNORMAL ECG 794.31/R94.31  History:         Patient has no prior history of Echocardiogram examinations.                  Risk Factors:Hypertension. CKD.  Sonographer:     Avanell Shackleton Referring Phys:  [id number] [place] Diagnosing Phys: Kirk Ruths MD  Sonographer Comments: Technically difficult study due to poor echo windows. Image acquisition challenging due to uncooperative patient. TDS. IT WAS DIFFICULT TO GET ADEQUATE ASSESSMENT OF THE AOV. PT HAS LIMITED MOBILITY DUE RA. IMPRESSIONS  1. Left ventricular ejection fraction, by estimation, is 60 to 65%. The left ventricle has normal function. The left ventricle has no regional wall motion abnormalities. There is mild left ventricular hypertrophy. Left ventricular diastolic parameters are consistent with Grade I diastolic dysfunction (impaired relaxation).  2. Right ventricular  systolic function is normal. The right ventricular size is moderately enlarged.  3. Left atrial size was severely dilated.  4. Right atrial size was mildly dilated.  5. The mitral valve is normal in structure. Mild mitral valve regurgitation. No evidence of mitral stenosis.  6. The aortic valve has an indeterminant number of cusps. Aortic valve regurgitation is mild. Mild to moderate aortic valve stenosis. FINDINGS  Left Ventricle: Left ventricular ejection fraction, by estimation, is 60 to 65%. The left ventricle has normal function. The left ventricle has no regional wall motion abnormalities. The left ventricular internal cavity size was normal in size. There is  mild left ventricular hypertrophy. Left ventricular diastolic parameters are consistent with Grade I diastolic dysfunction (impaired relaxation). Right Ventricle: The right ventricular size is moderately enlarged. Right ventricular systolic function is normal. Left Atrium: Left atrial size was severely dilated. Right Atrium: Right atrial size was mildly dilated. Pericardium: There is no evidence of pericardial effusion. Mitral Valve: The mitral valve is normal in structure. Normal mobility of the mitral valve leaflets. Mild mitral annular calcification. Mild mitral valve regurgitation. No evidence of mitral valve stenosis. Tricuspid Valve: The tricuspid valve is normal in structure. Tricuspid valve regurgitation is mild . No evidence of tricuspid stenosis. Aortic Valve: The aortic valve has an indeterminant number of cusps. Aortic valve regurgitation is mild. Aortic regurgitation PHT measures 398 msec. Mild to moderate aortic stenosis is present. Aortic valve mean gradient measures 18.0 mmHg. Aortic valve peak gradient measures 17.3 mmHg. Aortic valve area, by VTI measures 1.48 cm. Pulmonic Valve: The pulmonic valve was not well visualized. Pulmonic valve regurgitation is trivial. No evidence of pulmonic stenosis. Aorta: The aortic root is normal in  size and structure. Venous: The inferior vena cava was not well visualized.  LEFT VENTRICLE PLAX 2D LVIDd:         5.16 cm  Diastology LVIDs:         3.68 cm  LV e' lateral:   8.38 cm/s LV PW:         1.20 cm  LV E/e' lateral: 10.0 LV IVS:        1.21 cm  LV e' medial:    7.07 cm/s LVOT diam:     1.60 cm  LV E/e' medial:  11.8 LV SV:         63 LV SV Index:   39 LVOT Area:     2.01 cm  LEFT ATRIUM  Index       RIGHT ATRIUM           Index LA diam:        4.80 cm 2.92 cm/m  RA Area:     18.20 cm LA Vol (A2C):   94.5 ml 57.58 ml/m RA Volume:   53.40 ml  32.54 ml/m LA Vol (A4C):   65.0 ml 39.61 ml/m LA Biplane Vol: 86.6 ml 52.77 ml/m  AORTIC VALVE AV Area (Vmax):    1.47 cm AV Area (Vmean):   1.37 cm AV Area (VTI):     1.48 cm AV Vmax:           208.00 cm/s AV Vmean:          156.000 cm/s AV VTI:            0.429 m AV Peak Grad:      17.3 mmHg AV Mean Grad:      18.0 mmHg LVOT Vmax:         152.00 cm/s LVOT Vmean:        106.000 cm/s LVOT VTI:          0.315 m LVOT/AV VTI ratio: 0.73 AI PHT:            398 msec  AORTA Ao Root diam: 3.60 cm MITRAL VALVE                TRICUSPID VALVE MV Area (PHT): 2.47 cm     TR Peak grad:   33.4 mmHg MV Decel Time: 307 msec     TR Vmax:        289.00 cm/s Albert Peak grad: 132.4 mmHg Albert Mean grad: 89.3 mmHg     SHUNTS Albert Vmax:      575.33 cm/s   Systemic VTI:  0.32 m Albert Vmean:     446.3 cm/s    Systemic Diam: 1.60 cm MV E velocity: 83.70 cm/s MV A velocity: 103.00 cm/s MV E/A ratio:  0.81 Kirk Ruths MD Electronically signed by Kirk Ruths MD Signature Date/Time: 08/06/2019/1:17:00 PM    Final    CT Angio Abd/Pel W and/or Wo Contrast  Addendum Date: 08/09/2019   ADDENDUM REPORT: 08/09/2019 19:58 ADDENDUM: Additional impression point: Gallbladder distention is a nonspecific finding in the absence of inflammatory change. Chronic cholecystitis/gallbladder dysfunction as well as occasionally acute cholecystitis can present with this appearance. Recommend  correlation with right upper quadrant symptoms and labs. Could consider further evaluation with right upper quadrant ultrasound. Addendum by telephone at the time of submission on 08/09/2019 at 7:57 pm to provider [facility] , who verbally acknowledged these results. Electronically Signed   By: Lovena Le M.D.   On: 08/09/2019 19:58   Result Date: 08/09/2019 CLINICAL DATA:  GI bleed, recent discharge for same EXAM: CTA ABDOMEN AND PELVIS WITHOUT AND WITH CONTRAST TECHNIQUE: Multidetector CT imaging of the abdomen and pelvis was performed using the standard protocol during bolus administration of intravenous contrast. Multiplanar reconstructed images and MIPs were obtained and reviewed to evaluate the vascular anatomy. CONTRAST:  69mL OMNIPAQUE IOHEXOL 350 MG/ML SOLN COMPARISON:  Renal ultrasound 08/04/2019, CT angio chest 11/22/2018, abdominal ultrasound 08/19/2011 FINDINGS: VASCULAR Aorta: Calcified noncalcified atheromatous plaque present throughout the native abdominal aorta. No acute luminal abnormality, periaortic stranding or hemorrhage is seen. No aneurysm or ectasia. Celiac: Ostial plaque at the celiac axis origin results in some mild ostial narrowing which is exacerbated by slightly hook like configuration implicating some compression by the median arcuate  ligament. There is post stenotic dilatation up to 10 mm in size. Minimal plaque is present in the splenic artery branch as well. No other significant stenosis is seen however. No acute luminal abnormality such as dissection, aneurysm or features of vasculitis. SMA: Mild ostial plaque narrowing at the SMA origin with calcified and noncalcified plaque in the proximal SMA resulting in at most mild multifocal narrowings of the proximal vessel. Vessel otherwise normally opacified without acute luminal abnormality or features of vasculitis. Renals: Single renal arteries bilaterally. Ostial plaque results in mild narrowing on the right and moderate narrowing  on the left. No evidence of aneurysm, dissection, vasculitis or features of fibromuscular dysplasia. IMA: Ostial plaque resulting in severe narrowing of the IMA artery origin. Vessel is otherwise opacified normally without significant stenosis. No acute luminal abnormality. No features of vasculitis. Inflow: Extensive calcific plaque throughout the common, internal external iliac arteries which does result in some mild segmental narrowing of the proximal internal iliac arteries bilaterally. Proximal branches are otherwise normally opacified. Proximal Outflow: Plaque seen throughout the proximal outflow vessels including the bilateral common femoral and visualized superficial femoral and deep femoral arteries. Results in at least some mild narrowing at the right deep femoral origin. No other significant stenosis or acute luminal abnormality is seen. No aneurysm or ectasia. Veins: No acute abnormality of the major venous branches seen on venous or arterial phase imaging. Portal and hepatic veins are both filled well opacified and free of acute abnormality. Review of the MIP images confirms the above findings. NON-VASCULAR Lower chest: Small bilateral pleural effusions with some adjacent passive atelectasis which appears to enhance uniformly. Mild basilar bronchitic changes are present. Mild cardiomegaly with biatrial enlargement and three-vessel coronary artery disease as well as extensive calcification of the aortic leaflets and mitral valve annulus. Hepatobiliary: Punctate likely benign calcification in the caudate, could reflect sequela of prior granulomatous disease. No concerning liver lesions. Normal liver attenuation. Smooth surface contour. Marked distension of the gallbladder without visible calcified gallstone within the gallbladder lumen or biliary tree. No pericholecystic fluid or inflammation. No biliary ductal dilatation. Pancreas: Moderate pancreatic atrophy. No pancreatic inflammation, discernible  lesion or ductal dilatation. Spleen: Normal arterial and venous enhancement patterns of the spleen. Splenic size is upper limits normal. No focal splenic lesion. Adrenals/Urinary Tract: Mild lobular thickening of the adrenal glands may reflect some senescent adrenal hyperplasia. Kidneys enhance symmetrically. Calcification in the interpolar right kidney, previously suspected to reflect a renal calculus more accurately reflects a peripherally calcified, partially exophytic cyst (Bosniak IIF). No other focal concerning renal lesions. Mild circumferential thickening of the bladder wall with perivesicular hazy stranding and indentation of bladder base by an enlarged prostate. Stomach/Bowel: Small amount of posterior layering hyperdensity within a left upper quadrant small bowel loop likely reflect ingested material given the presence on precontrast examination. No abnormal sites of contrast blush or accumulation are seen to suggest a discernible site of active bleeding. Small sliding-type hiatal hernia. Distal stomach is unremarkable. Small duodenal lipoma (7/88) as well as two air-filled duodenal diverticula measuring up to 3.7 cm (7/80, 7/67) no small bowel thickening or dilatation. Appendix is not visualized. No focal inflammation the vicinity of the cecum to suggest an occult appendicitis. No colonic dilatation or wall thickening. Portion of the sigmoid colon protrudes into an indirect left inguinal hernia without convincing evidence of vascular compromise of the bowel wall or resulting mechanical obstruction. Into a scattered colonic diverticula without focal inflammation to suggest diverticulitis. 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 No pathologically enlarged lymph nodes. Reproductive: Mild prostatomegaly. No focal concerning abnormality of the prostate or seminal vesicles.  Other: Diffuse circumferential body wall edema most pronounced posteriorly. Fat and bowel containing left inguinal hernia. Small fat containing right inguinal hernia as well. No abdominopelvic free air or fluid. Musculoskeletal: Multilevel degenerative changes are present in the imaged portions of the spine. 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 Additional degenerative changes of the hips and pelvis including partial ankylosis of the SI joints. IMPRESSION: 1. No abnormal sites of contrast blush or accumulation are seen to suggest a discernible site of active bleeding. If there is persisting concern for slow bleed or intermittent bleeding, nuclear medicine bleeding scan could be performed. 2. Duodenal and colonic diverticulosis without evidence of active diverticular inflammation, can present with GI bleeding in the absence of inflammation. 3. Aortic Atherosclerosis (ICD10-I70.0). Multilevel splanchnic and renal artery stenoses secondary to atheromatous disease, as detailed level by level above. 4. Hook-like configuration of the celiac axis could reflect some compression by the median arcuate ligament. Mild poststenotic dilatation. 5. Portion of the sigmoid colon protrudes into an indirect left inguinal hernia without convincing evidence of vascular compromise of the bowel wall or resulting mechanical obstruction. 6. 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 7. Mild circumferential thickening of the bladder wall with perivesicular hazy stranding and indentation of bladder base by an enlarged prostate. Findings are suggestive of cystitis and/or chronic outlet obstruction. Correlate with urinalysis. 8. Cardiomegaly with biatrial enlargement and three-vessel coronary artery disease. 9. Features suggestive of anasarca with body wall  edema and bilateral pleural effusions. 10. Thick peripheral calcifications of the partially exophytic interpolar right renal cyst measuring up to 10 mm. Recommend renal protocol MRI at 6 and 12 months then yearly for 5 years. This recommendation follows ACR consensus guidelines: Management of the Incidental Renal Mass on CT: A White Paper of the ACR Incidental Findings Committee. J Am Coll Radiol [phone]. Electronically Signed: By: Lovena Le M.D. On: 08/09/2019 19:17   US Abdomen Limited RUQ  Result Date: 08/09/2019 CLINICAL DATA:  Gallbladder disease, abnormal CT EXAM: ULTRASOUND ABDOMEN LIMITED RIGHT UPPER QUADRANT COMPARISON:  CT 08/09/2019 FINDINGS: Gallbladder: Gallbladder appears diffusely distended with heterogeneous mixture of layering biliary sludge. No visible shadowing gallstones are present. No significant gallbladder wall thickening. Sonographic Percell Miller sign is reportedly negative. Common bile duct: Diameter: 4.8 mm, nondilated Liver: Suboptimal visualization of the left lobe liver due to patient body habitus. Diffusely coarsened hepatic echotexture without visible focal lesion. Portal vein is patent on color Doppler imaging with normal direction of blood flow towards the liver. Other: None. IMPRESSION: Mildly coarsened liver echotexture is nonspecific. Could correlate with LFTs. Gallbladder distention with extensive biliary sludge. No visible calcified gallstones. No additional supporting features of acute cholecystitis however distention is a nonspecific finding that can reflect gallbladder dysfunction/chronic cholecystitis. Could consider outpatient evaluation with HIDA scan with gallbladder ejection fraction. Electronically Signed   By: Lovena Le M.D.   On: 08/09/2019 21:11   DG ESOPHAGUS W SINGLE CM (SOL OR THIN BA)  Result Date: 08/26/2019 CLINICAL DATA:  Dysphagia EXAM: ESOPHOGRAM/BARIUM SWALLOW TECHNIQUE: Single contrast examination was performed using  thin barium.  FLUOROSCOPY TIME:  Fluoroscopy Time:  48 seconds Radiation Exposure Index (if provided by the fluoroscopic device): 14 COMPARISON:  None. FINDINGS: The patient is unable to stand. Single-contrast esophagram attempted with the patient approximately 40 degrees upright. With 2 small swallows, there was immediate aspiration into the tracheobronchial tree. Small hiatal hernia also noted. At this point the procedure was stopped. Images obtained for documentation. IMPRESSION: Positive exam for immediate aspiration into the tracheobronchial tree. Small hiatal hernia These results were called by telephone at the time of interpretation on 08/26/2019 at 1:00pm to provider Loletha Grayer , who verbally acknowledged these results. Electronically Signed   By: Jerilynn Mages.  Shick M.D.   On: 08/26/2019 13:20    Labs:  CBC: Recent Labs    08/25/19 0130 08/26/19 0539 08/27/19 0557 08/28/19 0416  WBC 14.6* 9.8 10.0 9.5  HGB 9.0* 8.2* 8.4* 8.3*  HCT 29.1* 26.1* 26.4* 27.0*  PLT 207 176 193 214    COAGS: Recent Labs    08/03/19 2214 08/04/19 0509 08/05/19 1121 08/09/19 1727 08/24/19 2037 08/25/19 0130  INR 2.2*   < > 1.3* 1.0 1.3* 1.3*  APTT 36  --   --  32  --   --    < > = values in this interval not displayed.    BMP: Recent Labs    08/25/19 0130 08/26/19 1423 08/27/19 0557 08/28/19 0416  NA 139 141 143 146*  K 4.5 3.8 3.4* 3.3*  CL 111 111 112* 118*  CO2 20* 20* 18* 19*  GLUCOSE 94 83 85 83  BUN 24* 26* 23 21  CALCIUM 7.3* 7.4* 7.3* 7.1*  CREATININE 1.13 1.16 1.13 1.01  GFRNONAA 59* 58* 59* >60  GFRAA >60 >60 >60 >60    LIVER FUNCTION TESTS: Recent Labs    08/10/19 0500 08/11/19 0422 08/12/19 0901 08/24/19 2037  BILITOT 1.4* 1.1 1.1 1.0  AST 30 27 26  35  ALT 16 16 15 15   ALKPHOS 68 95 101 65  PROT 4.3* 4.3* 4.7* 5.7*  ALBUMIN 1.9* 1.9* 2.0* 2.2*    TUMOR MARKERS: No results for input(s): AFPTM, CEA, CA199, CHROMGRNA in the last 8760 hours.  Assessment and Plan:  Dysphagia  = patient is very high risk for aspiration and NPO is recommended by speech therapy.  Need for image guided placement of percutaneous gastrostomy tube.  CTA of the abdomen which was done 08/09/19 was reviewed by Dr. Anselm Pancoast to evaluate his anatomy.   The colon does overlie the stomach to a certain degree and the patient would require barium enema during procedure in order to visualize the colon and avoid injury.  Current Clostridium difficile infection - patient reports continued frequent watery stools.  Recommend Flouro guided NGT placement for now for nutrition and to treat C diff with oral vancomycin.  Will tentatively plan for Monday August 30th, last case of the day (since patient on contact isolation) as long as symptoms have improved.  Risks and benefits image guided gastrostomy tube placement was discussed with the patient including, but not limited to the need for a barium enema during the procedure, bleeding, infection, peritonitis and/or damage to adjacent structures.  All of the patient's questions were answered, patient is agreeable to proceed.  Consent signed and in chart.  Thank you for this interesting consult.  I greatly enjoyed meeting Albert [name] and look forward to participating in their care.  A copy of this report was sent to the requesting provider on this date.  Electronically Signed: Murrell Redden, PA-C   08/29/2019, 11:03 AM      I spent a total of 40  Minutes in face to face in clinical consultation, greater than 50% of which was counseling/coordinating care for gastrostomy tube placement.

## 2019-08-29 NOTE — Progress Notes (Signed)
Physical Therapy Treatment Patient Details Name: Albert [name] MRN: [id number] DOB: 10-Sep-1933 Today's Date: 08/29/2019    History of Present Illness Albert [name] is 86 y.o. male with PMH of CKD, HTN, HLD with recent admission for GI bleed and urosepsis presents from SNF with concerns for fever and hypotensive. Admitted for sepsis and aspiration pneunonia. Also with C. diff. Wide-complex tachycardia secondary to sepsis. Several recent hospitalizations.    PT Comments    Pt ready for session.  Supine and seated ex x 15 AAROM with emphasis on B knee stretching to flexion as ROM is limited and does not allow for proper foot positioning to attempt effective standing.  With assist he is able to improve ROM but L remains more limited than R.  Lateral scoot remains max a x 1 with very poor clearance of hips to assist.  He is able to remains sitting x 25 minutes unsupported EOB today with no LOB and generally seems more comfortable and confident.  Returned to supine due to fatigue.  He does seem to have some increased swelling in all extremities today.  LLE > RLE but no redness, warmth or pain.      Follow Up Recommendations  SNF     Equipment Recommendations  None recommended by PT    Recommendations for Other Services       Precautions / Restrictions Precautions Precautions: Fall;Other (comment) Precaution Comments: C.Diff Restrictions Other Position/Activity Restrictions: skin rash/sensitivity    Mobility  Bed Mobility Overal bed mobility: Needs Assistance Bed Mobility: Rolling Rolling: Mod assist   Supine to sit: Mod assist Sit to supine: Max assist   General bed mobility comments: able to transition and remains sitting x 25 minutes with +1 assist today  Transfers Overall transfer level: Needs assistance Equipment used: None Transfers: Lateral/Scoot Transfers          Lateral/Scoot Transfers: Mod assist;Max assist;From elevated surface General transfer comment:  uanble  Ambulation/Gait                 Stairs             Wheelchair Mobility    Modified Rankin (Stroke Patients Only)       Balance Overall balance assessment: Needs assistance Sitting-balance support: Feet unsupported;Bilateral upper extremity supported Sitting balance-Leahy Scale: Fair Sitting balance - Comments: sat x 25 miutes with no LOB today   Standing balance support: Bilateral upper extremity supported Standing balance-Leahy Scale: Zero Standing balance comment: NT - uanble to attempt                            Cognition Arousal/Alertness: Awake/alert Behavior During Therapy: WFL for tasks assessed/performed Overall Cognitive Status: Within Functional Limits for tasks assessed                                 General Comments: alert and orientated but needs repeated questions at times.      Exercises Other Exercises Other Exercises: supine and seated ex x 10 AAROM with empasis on knee flexion to allow for standing attempts.    General Comments        Pertinent Vitals/Pain Pain Assessment: No/denies pain    Home Living                      Prior Function  PT Goals (current goals can now be found in the care plan section) Progress towards PT goals: Progressing toward goals    Frequency    Min 2X/week      PT Plan Current plan remains appropriate    Co-evaluation              AM-PAC PT "6 Clicks" Mobility   Outcome Measure  Help needed turning from your back to your side while in a flat bed without using bedrails?: A Lot Help needed moving from lying on your back to sitting on the side of a flat bed without using bedrails?: A Lot Help needed moving to and from a bed to a chair (including a wheelchair)?: Total Help needed standing up from a chair using your arms (e.g., wheelchair or bedside chair)?: Total Help needed to walk in hospital room?: Total Help needed climbing 3-5  steps with a railing? : Total 6 Click Score: 8    End of Session Equipment Utilized During Treatment: Gait belt Activity Tolerance: Patient tolerated treatment well Patient left: with call bell/phone within reach;with bed alarm set;in bed Nurse Communication: Mobility status Pain - Right/Left: Right Pain - part of body: Knee     Time: 4037-5436 PT Time Calculation (min) (ACUTE ONLY): 41 min  Charges:  $Therapeutic Exercise: 23-37 mins $Therapeutic Activity: 8-22 mins                    Chesley Noon, PTA 08/29/19, 10:53 AM

## 2019-08-29 NOTE — Progress Notes (Addendum)
Patient ID: Albert [name], male   DOB: 1933/07/26, 86 y.o.   MRN: [id number] [facility] PROGRESS NOTE  Albert [name] OBS:[id number] DOB: Jun 11, 1933 DOA: 08/24/2019 PCP: [name], MD  HPI/Subjective: Patient feeling okay.  Still having bowel movements.  Not much abdominal pain.  Still coughing a little bit, but less.  Objective: Vitals:   08/29/19 0847 08/29/19 1148  BP: 122/77 128/67  Pulse: 89 99  Resp: 18 19  Temp: 97.8 F (36.6 C) 97.6 F (36.4 C)  SpO2: 98% 100%    Intake/Output Summary (Last 24 hours) at 08/29/2019 1647 Last data filed at 08/29/2019 1048 Gross per 24 hour  Intake 618.71 ml  Output 801 ml  Net -182.29 ml   Filed Weights   08/27/19 0510 08/28/19 0426 08/29/19 0334  Weight: 69.5 kg 69.6 kg 69.2 kg    ROS: Review of Systems  Respiratory: Positive for cough. Negative for shortness of breath.   Cardiovascular: Negative for chest pain.  Gastrointestinal: Positive for diarrhea. Negative for abdominal pain, nausea and vomiting.   Exam: Physical Exam HENT:     Nose: No mucosal edema.     Mouth/Throat:     Pharynx: No oropharyngeal exudate.  Eyes:     General: Lids are normal.     Conjunctiva/sclera: Conjunctivae normal.     Pupils: Pupils are equal, round, and reactive to light.  Cardiovascular:     Rate and Rhythm: Normal rate and regular rhythm.     Heart sounds: Normal heart sounds, S1 normal and S2 normal.  Pulmonary:     Breath sounds: Examination of the right-lower field reveals decreased breath sounds and rhonchi. Decreased breath sounds and rhonchi present. No wheezing or rales.  Abdominal:     Palpations: Abdomen is soft.     Tenderness: There is no abdominal tenderness.  Musculoskeletal:     Right lower leg: No swelling.     Left lower leg: No swelling.  Skin:    General: Skin is warm.     Findings: No rash.  Neurological:     Mental Status: He is alert and oriented to person, place, and time.       Data  Reviewed: Basic Metabolic Panel: Recent Labs  Lab 08/24/19 2037 08/25/19 0130 08/26/19 1423 08/27/19 0557 08/28/19 0416  NA 140 139 141 143 146*  K 4.5 4.5 3.8 3.4* 3.3*  CL 109 111 111 112* 118*  CO2 21* 20* 20* 18* 19*  GLUCOSE 111* 94 83 85 83  BUN 24* 24* 26* 23 21  CREATININE 1.21 1.13 1.16 1.13 1.01  CALCIUM 7.5* 7.3* 7.4* 7.3* 7.1*  MG  --   --  1.4*  --  1.6*   Liver Function Tests: Recent Labs  Lab 08/24/19 2037  AST 35  ALT 15  ALKPHOS 65  BILITOT 1.0  PROT 5.7*  ALBUMIN 2.2*   CBC: Recent Labs  Lab 08/24/19 2037 08/25/19 0130 08/26/19 0539 08/27/19 0557 08/28/19 0416  WBC 17.3* 14.6* 9.8 10.0 9.5  NEUTROABS 15.0*  --   --   --   --   HGB 9.1* 9.0* 8.2* 8.4* 8.3*  HCT 29.3* 29.1* 26.1* 26.4* 27.0*  MCV 92.7 94.2 91.6 91.3 91.8  PLT 246 207 176 193 214   Cardiac Enzymes: Recent Labs  Lab 08/29/19 1338  CKTOTAL 45*   BNP (last 3 results) Recent Labs    08/24/19 2037  BNP 728.7*    CBG: Recent Labs  Lab 08/26/19 0057 08/26/19 0147  08/26/19 0610  GLUCAP 77 88 69*    Recent Results (from the past 240 hour(s))  Urine culture     Status: None   Collection Time: 08/24/19  8:37 PM   Specimen: In/Out Cath Urine  Result Value Ref Range Status   Specimen Description   Final    IN/OUT CATH URINE Performed at [facility], [address] [place], [place] [address]    Special Requests   Final    NONE Performed at [facility], [address], [place], [place] [address]    Culture   Final    NO GROWTH Performed at [facility], [place] [address], [place], [place] [address]    Report Status 08/26/2019 FINAL  Final  SARS Coronavirus 2 by RT PCR (hospital order, performed in [facility] hospital lab) Nasopharyngeal Nasopharyngeal Swab     Status: None   Collection Time: 08/24/19  8:53 PM   Specimen: Nasopharyngeal Swab  Result Value Ref Range Status   SARS Coronavirus 2 NEGATIVE NEGATIVE Final    Comment:  (NOTE) SARS-CoV-2 target nucleic acids are NOT DETECTED.  The SARS-CoV-2 RNA is generally detectable in upper and lower respiratory specimens during the acute phase of infection. The lowest concentration of SARS-CoV-2 viral copies this assay can detect is 250 copies / mL. A negative result does not preclude SARS-CoV-2 infection and should not be used as the sole basis for treatment or other patient management decisions.  A negative result may occur with improper specimen collection / handling, submission of specimen other than nasopharyngeal swab, presence of viral mutation(s) within the areas targeted by this assay, and inadequate number of viral copies (<250 copies / mL). A negative result must be combined with clinical observations, patient history, and epidemiological information.  Fact Sheet for Patients:   StrictlyIdeas.no  Fact Sheet for Healthcare Providers: BankingDealers.co.za  This test is not yet approved or  cleared by the [place] FDA and has been authorized for detection and/or diagnosis of SARS-CoV-2 by FDA under an Emergency Use Authorization (EUA).  This EUA will remain in effect (meaning this test can be used) for the duration of the COVID-19 declaration under Section 564(b)(1) of the Act, 21 U.S.C. section 360bbb-3(b)(1), unless the authorization is terminated or revoked sooner.  Performed at [facility], [address] [place], [place] [address]   Blood Culture (routine x 2)     Status: None   Collection Time: 08/24/19  9:00 PM   Specimen: BLOOD  Result Value Ref Range Status   Specimen Description BLOOD BLOOD LEFT FOREARM  Final   Special Requests   Final    BOTTLES DRAWN AEROBIC AND ANAEROBIC Blood Culture results may not be optimal due to an excessive volume of blood received in culture bottles   Culture   Final    NO GROWTH 5 DAYS Performed at [facility], [address],  [place], [place] [address]    Report Status 08/29/2019 FINAL  Final  Blood Culture (routine x 2)     Status: None   Collection Time: 08/24/19  9:05 PM   Specimen: BLOOD  Result Value Ref Range Status   Specimen Description BLOOD BLOOD LEFT HAND  Final   Special Requests   Final    BOTTLES DRAWN AEROBIC AND ANAEROBIC Blood Culture adequate volume   Culture   Final    NO GROWTH 5 DAYS Performed at [facility], [address] [place], [place] [address]  Report Status 08/29/2019 FINAL  Final  MRSA PCR Screening     Status: None   Collection Time: 08/25/19 10:33 PM   Specimen: Nasopharyngeal  Result Value Ref Range Status   MRSA by PCR NEGATIVE NEGATIVE Final    Comment:        The GeneXpert MRSA Assay (FDA approved for NASAL specimens only), is one component of a comprehensive MRSA colonization surveillance program. It is not intended to diagnose MRSA infection nor to guide or monitor treatment for MRSA infections. Performed at [facility], [place], [place] [address]   C Difficile Quick Screen w PCR reflex     Status: Abnormal   Collection Time: 08/26/19 10:22 AM   Specimen: STOOL  Result Value Ref Range Status   C Diff antigen POSITIVE (A) NEGATIVE Final   C Diff toxin POSITIVE (A) NEGATIVE Final   C Diff interpretation Toxin producing C. difficile detected.  Final    Comment: CRITICAL RESULT CALLED TO, READ BACK BY AND VERIFIED WITH: ANNIE RITCHIE AT 1128 08/26/19.PMF Performed at [facility], [address] [place], [place] [address]      Studies: Lenora Boys Tube Plc W/Fl W/Rad  Result Date: 08/29/2019 CLINICAL DATA:  Oro pharyngeal dysplasia.  Feeding tube placement. EXAM: NASO G TUBE PLACEMENT WITH FL AND WITH RAD FLUOROSCOPY TIME:  Fluoroscopy Time:  3 minutes and 12 seconds COMPARISON:  None. FINDINGS: Weighted tip feeding tube was placed using fluoroscopic guidance. Water-soluble contrast was administered to confirm  positioning at the stomach pylorus/duodenal bulb. IMPRESSION: Weighted tip feeding tube placed using fluoroscopic guidance. Feeding tube is appropriately positioned with tip at the stomach pylorus/duodenal bulb. Electronically Signed   By: Franki Cabot M.D.   On: 08/29/2019 13:20   DG Swallowing Func-Speech Pathology  Result Date: 08/28/2019 Objective Swallowing Evaluation: Type of Study: MBS-Modified Barium Swallow Study  Patient Details Name: Albert [name] MRN: [id number] Date of Birth: January 29, 1933 Today's Date: 08/28/2019 Time: SLP Start Time (ACUTE ONLY): 6629 -SLP Stop Time (ACUTE ONLY): 0935 SLP Time Calculation (min) (ACUTE ONLY): 30 min Past Medical History: Past Medical History: Diagnosis Date . Actinic keratosis 04/23/2008  L elbow distal tricep (bx proven) . Basal cell carcinoma 06/17/2006  L preauricular  . Basal cell carcinoma 05/26/2012  L ant neck  . Basal cell carcinoma 11/02/2012  Glabella - excision  . Basal cell carcinoma 08/15/2014  L med ankle  . Basal cell carcinoma 02/23/2017  R lat infrapectoral - excision . Basal cell carcinoma 12/08/2017  L mid to distal lat volar forearm  . Hypertension  . Squamous cell carcinoma of skin 02/14/2014  R lat low back - ED&C . Squamous cell carcinoma of skin 10/02/2014  R prox lat bicep - ED&C Past Surgical History: Past Surgical History: Procedure Laterality Date . COLONOSCOPY N/A 08/05/2019  Procedure: COLONOSCOPY;  Surgeon: Toledo, Benay Pike, MD;  Location: [facility] ENDOSCOPY;  Service: Gastroenterology;  Laterality: N/A; . ESOPHAGOGASTRODUODENOSCOPY N/A 08/05/2019  Procedure: ESOPHAGOGASTRODUODENOSCOPY (EGD);  Surgeon: Toledo, Benay Pike, MD;  Location: [facility] ENDOSCOPY;  Service: Gastroenterology;  Laterality: N/A; HPI: MAXTEN SHULER is a 86 y.o. male with PMH as noted below presents from his facility with fever and low blood pressure today.  Previously he had been admitted for upper GI bleed, EGD performed and pt diagnosed with esophageal and duodenal ulcers  at the end of last month.  Chest x-ray indicates chronic appearing increased lung markings with mild right basilar infiltrate. ST consulted as pt  reports difficulty swallowing.  Subjective: pt pleasant, conversant, fair historian Assessment / Plan / Recommendation CHL IP CLINICAL IMPRESSIONS 08/28/2019 Clinical Impression Pt presents with severe sensori-motor oropharyngeal dysphagia. Pt is at an extremely high risk of aspiration. Pt's oral phase is c/b decreased lingual manipulation of nectar thick liquids, thin liquids and puree. This results in difficulty with posterior movement of boluses and premature spillage. Pt's pharyngeal phase is c/b swallow initiation at the pyriform sinuses, decreased epiglottic inversion, reduced anteriorn laryngeal mobility and reduced laryngeal elevation. This results in decreased glottal closure and significantly reduced cricopharyngus relaxation.  Pt aspirates the majority of boluses during the swallow and less than 10% of teaspoon bolus passes thru UES which results in significant pharyngeal residue in the vallecula, pyrifrom sinuses and residue at the cricopharyngeal segment (the residue here is what appears to present a globus sensation). All aspiration was silent except for 1 event but pt's was not able to clear the aspirates with coughing. Although puree doesn't enter his airway, he is at a HURGE risk of aspirating the residue left within his pharynx. The eitology of pt's inadequate duration and amplitude of UES opening is unclear. To help with differential diagnosis of neuromuscular vs physiological eitology recommend consult to neurology and ENT. Given pt's high aspiration risk and decreased ability to maintain his nutritional status, recommend alternate nutrition and continued NPO status.   SLP Visit Diagnosis Dysphagia, pharyngoesophageal phase (R13.14) Attention and concentration deficit following -- Frontal lobe and executive function deficit following -- Impact on safety  and function Severe aspiration risk;Risk for inadequate nutrition/hydration   CHL IP TREATMENT RECOMMENDATION 08/28/2019 Treatment Recommendations Therapy as outlined in treatment plan below   Prognosis 08/28/2019 Prognosis for Safe Diet Advancement Guarded Barriers to Reach Goals Severity of deficits Barriers/Prognosis Comment -- CHL IP DIET RECOMMENDATION 08/28/2019 SLP Diet Recommendations NPO Liquid Administration via -- Medication Administration Via alternative means Compensations -- Postural Changes --   CHL IP OTHER RECOMMENDATIONS 08/28/2019 Recommended Consults Consider ENT evaluation Oral Care Recommendations Oral care QID Other Recommendations Remove water pitcher;Prohibited food (jello, ice cream, thin soups);Have oral suction available   CHL IP FOLLOW UP RECOMMENDATIONS 08/28/2019 Follow up Recommendations (No Data)   CHL IP FREQUENCY AND DURATION 08/28/2019 Speech Therapy Frequency (ACUTE ONLY) min 2x/week Treatment Duration 2 weeks      CHL IP ORAL PHASE 08/28/2019 Oral Phase Impaired Oral - Pudding Teaspoon -- Oral - Pudding Cup -- Oral - Honey Teaspoon -- Oral - Honey Cup -- Oral - Nectar Teaspoon Weak lingual manipulation;Reduced posterior propulsion;Delayed oral transit;Piecemeal swallowing;Premature spillage Oral - Nectar Cup -- Oral - Nectar Straw -- Oral - Thin Teaspoon Weak lingual manipulation;Reduced posterior propulsion;Piecemeal swallowing;Delayed oral transit;Premature spillage Oral - Thin Cup -- Oral - Thin Straw Weak lingual manipulation;Reduced posterior propulsion;Piecemeal swallowing;Delayed oral transit;Decreased bolus cohesion;Premature spillage Oral - Puree Weak lingual manipulation;Reduced posterior propulsion;Delayed oral transit;Premature spillage Oral - Mech Soft -- Oral - Regular -- Oral - Multi-Consistency -- Oral - Pill -- Oral Phase - Comment --  CHL IP PHARYNGEAL PHASE 08/28/2019 Pharyngeal Phase Impaired Pharyngeal- Pudding Teaspoon -- Pharyngeal -- Pharyngeal- Pudding Cup --  Pharyngeal -- Pharyngeal- Honey Teaspoon -- Pharyngeal -- Pharyngeal- Honey Cup -- Pharyngeal -- Pharyngeal- Nectar Teaspoon Reduced epiglottic inversion;Reduced anterior laryngeal mobility;Reduced laryngeal elevation;Reduced airway/laryngeal closure;Penetration/Aspiration during swallow;Moderate aspiration;Pharyngeal residue - valleculae;Pharyngeal residue - pyriform;Pharyngeal residue - cp segment;Delayed swallow initiation-pyriform sinuses Pharyngeal Material enters airway, passes BELOW cords without attempt by patient to eject out (silent aspiration) Pharyngeal- Nectar Cup -- Pharyngeal --  Pharyngeal- Nectar Straw -- Pharyngeal -- Pharyngeal- Thin Teaspoon Delayed swallow initiation-pyriform sinuses;Reduced epiglottic inversion;Reduced anterior laryngeal mobility;Reduced laryngeal elevation;Reduced airway/laryngeal closure;Penetration/Aspiration during swallow;Penetration/Apiration after swallow;Moderate aspiration;Pharyngeal residue - valleculae;Pharyngeal residue - pyriform;Pharyngeal residue - cp segment Pharyngeal Material enters airway, remains ABOVE vocal cords and not ejected out Pharyngeal- Thin Cup Delayed swallow initiation-pyriform sinuses;Reduced epiglottic inversion;Reduced anterior laryngeal mobility;Reduced laryngeal elevation;Reduced airway/laryngeal closure;Penetration/Aspiration during swallow;Moderate aspiration;Pharyngeal residue - valleculae;Pharyngeal residue - pyriform;Pharyngeal residue - cp segment Pharyngeal Material enters airway, passes BELOW cords without attempt by patient to eject out (silent aspiration) Pharyngeal- Thin Straw Delayed swallow initiation-pyriform sinuses;Reduced epiglottic inversion;Reduced anterior laryngeal mobility;Reduced laryngeal elevation;Reduced airway/laryngeal closure;Penetration/Aspiration during swallow;Penetration/Apiration after swallow;Moderate aspiration;Pharyngeal residue - valleculae;Pharyngeal residue - pyriform;Pharyngeal residue - cp segment  Pharyngeal Material enters airway, passes BELOW cords and not ejected out despite cough attempt by patient Pharyngeal- Puree Delayed swallow initiation-vallecula;Delayed swallow initiation-pyriform sinuses;Reduced pharyngeal peristalsis;Reduced epiglottic inversion;Reduced anterior laryngeal mobility;Reduced laryngeal elevation;Reduced airway/laryngeal closure;Pharyngeal residue - valleculae;Pharyngeal residue - pyriform;Pharyngeal residue - cp segment Pharyngeal Material does not enter airway Pharyngeal- Mechanical Soft -- Pharyngeal -- Pharyngeal- Regular -- Pharyngeal -- Pharyngeal- Multi-consistency -- Pharyngeal -- Pharyngeal- Pill -- Pharyngeal -- Pharyngeal Comment recommend neruology consult to assess for neuromuscular deficits  CHL IP CERVICAL ESOPHAGEAL PHASE 08/28/2019 Cervical Esophageal Phase Impaired Pudding Teaspoon -- Pudding Cup -- Honey Teaspoon -- Honey Cup -- Nectar Teaspoon Reduced cricopharyngeal relaxation Nectar Cup -- Nectar Straw -- Thin Teaspoon Reduced cricopharyngeal relaxation Thin Cup Reduced cricopharyngeal relaxation Thin Straw Reduced cricopharyngeal relaxation Puree Reduced cricopharyngeal relaxation Mechanical Soft -- Regular -- Multi-consistency -- Pill -- Cervical Esophageal Comment recommend ENT consult to assess for physiological deficits Happi Overton 08/28/2019, 12:24 PM               Scheduled Meds: . enoxaparin (LOVENOX) injection  40 mg Subcutaneous Q24H  . feeding supplement (OSMOLITE 1.5 CAL)  237 mL Per Tube Once  . [START ON 09/03/2019] iohexol  500 mL Per Tube Q1H  . metoprolol tartrate  12.5 mg Oral BID  . pantoprazole (PROTONIX) IV  40 mg Intravenous Q12H  . vancomycin  125 mg Per Tube QID   Continuous Infusions: . sodium chloride 250 mL (08/29/19 0621)  . ampicillin-sulbactam (UNASYN) IV 3 g (08/29/19 1407)  . dextrose 5 % and 0.9 % NaCl with KCl 20 mEq/L 40 mL/hr at 08/27/19 1141    Assessment/Plan:  1. Sepsis, present on admission with  hypotension.  This has improved.  Aspiration pneumonia on Unasyn (day 4 of 5), C. difficile colitis on vancomycin via NG tube now.  Get rid of IV Flagyl. 2. C. difficile colitis.  Vancomycin via NG tube. 3. Dysphagia with failed swallow evaluation with aspiration on esophagram and modified barium swallow.  Neurology did an NIF which was better with second time that they did it.  They ordered a MRI of the brain and cervical spine to rule out any other etiology of trouble swallowing.  ENT tentatively has him on the schedule for tomorrow for dilation of the upper esophageal sphincter.  We will have to check his swallowing again after that.  Interventional radiology can potentially do a PEG feeding tube after completion of treatment for C. difficile colitis.  Currently has NG tube and will give 1 can of Osmolite this evening will be n.p.o. after midnight.  Can either resume tube feeding or trial of swallowing after EUS dilation. 4. Hyperlipidemia.  Hold atorvastatin 5. Hypomagnesemia this was replaced recheck electrolytes tomorrow 6. Hypokalemia replace potassium and IV fluids 7.  Chronic kidney disease stage II 8. Iron deficiency anemia with prior blood loss, esophageal ulcer on recent endoscopy.  Hemoglobin on the lower side but stable. 9. Smudge cells seen on CBC.  Will need referral to hematology as outpatient for likely underlying CLL. 10. Esophageal ulcer and esophagitis on Protonix.        Code Status:     Code Status Orders  (From admission, onward)         Start     Ordered   08/25/19 0015  Full code  Continuous        08/25/19 0014        Code Status History    Date Active Date Inactive Code Status Order ID Comments User Context   08/09/2019 2026 08/14/2019 2249 Full Code [id number]  Bradly Bienenstock, NP ED   08/04/2019 1827 08/08/2019 2341 Full Code [id number]  Georgette Shell, MD ED   08/04/2019 0533 08/04/2019 1826 Full Code [id number]  Vianne Bulls, MD ED   11/23/2018 2241  12/01/2018 1601 Full Code [id number]  Phillips Grout, MD Inpatient   11/22/2018 1732 11/23/2018 2212 Full Code [id number]  Para Skeans, MD ED   Advance Care Planning Activity    Advance Directive Documentation     Most Recent Value  Type of Advance Directive Healthcare Power of Attorney  Pre-existing out of facility DNR order (yellow form or pink MOST form) --  "MOST" Form in Place? --     Family Communication: Spoke with Darlene on the phone Disposition Plan: Status is: Inpatient  Dispo: The patient is from: Rehab              Anticipated d/c is to: Rehab              Anticipated d/c date is: Yet to be determined secondary the patient will have to have some way of getting nutrition              Patient currently being treated for aspiration pneumonia and C. difficile colitis.  Also failed swallow evaluation.  They will set up for esophageal upper sphincter dilation tomorrow tentatively if does okay with neurological testing today.  Consultants:  Neuro  ENT  Cardiology  Antibiotics:  Unasyn  Vancomycin  Time spent: 40 minutes in coordination of care and speaking with ENT, neurology, interventional radiology.  [place]  [facility] [employer]

## 2019-08-29 NOTE — Progress Notes (Signed)
Patient unable to complete Vital capacity, patient performed task correctly and to best of his ability but data was insufficient.  NIF -25

## 2019-08-29 NOTE — Consult Note (Signed)
Consultation Note Date: 08/29/2019   Patient Name: Albert [name]  DOB: 1933/04/05  MRN: [id number]  Age / Sex: 86 y.o., male  PCP: [name], MD Referring Physician: Loletha Grayer, MD  Reason for Consultation: Establishing goals of care  HPI/Patient Profile: 86 y.o. male  with past medical history of CKD, HTN, HLD, s/p COVID admission winter 2020, recent admission for GI bleeding dx with esophageal and duodenal ulcers, admitted on 08/24/2019 with sepsis r/t aspiration pneumonia and C. Difficile infection. Admission complicated by severe dysphagia, and diagnosis of a fib. ENT and neurology consulted for dysphagia, NG tube has been placed and he is NPO.     Clinical Assessment and Goals of Care: Reviewed chart and met with patient at bedside.  Very pleasant gentleman who was frustrated with his day. He was fatigued from multiple procedures and test.  We briefly discussed his current health state, possible trajectories and goals of care.  Prior to admission he was at [facility] for rehab- before that he was home, independent, enjoyed driving his car. He worked in [employer] for most of his life and valued being active and working physically. He lived at home alone. His wife died in 2012-04-02. Per PT note- he is unable to stand due to weakness. [name] expressed concerns stating that he felt he was being given different information and prognoses from various providers. He notes that he feels one provider tells him he is going to be fine, and another tells him he is going to die.  We discussed what his goals would be if he were to be at end of life or at risk of dying. He notes that he would not want to die in the hospital. He would prefer to be at his home that he built with his wife- just before she died. He notes he would just want to be home and eat what he wants to eat. We discussed his feeding tube- he is ok with NG  tube in place for now. He is unsure about permanent tube. He feels that he has good quality of life for now and wishes to continue aggressive medical efforts to preserve that.  Code status was not discussed as [name] was fatigued. He did note that if he were to be unable to make his own decisions then he would want [name]- who is his caretaker- to be his main decision maker. He has no family members.  Primary Decision Maker PATIENT    SUMMARY OF RECOMMENDATIONS -Continue current scope of care -Recommend that all providers discuss patient's status and prognosis clearly and simply with patient -PMT will visit patient tomorrow for further discussion regarding code status and advanced care planning    Code Status/Advance Care Planning:  Full code  Prognosis:    Unable to determine  Discharge Planning: [facility] for rehab with Palliative care service follow-up  Primary Diagnoses: Present on Admission: . CKD (chronic kidney disease), stage III . Benign essential hypertension . Elevated troponin   I have reviewed the  medical record, interviewed the patient and family, and examined the patient. The following aspects are pertinent.  Past Medical History:  Diagnosis Date  . Actinic keratosis 04/23/2008   L elbow distal tricep (bx proven)  . Basal cell carcinoma 06/17/2006   L preauricular   . Basal cell carcinoma 05/26/2012   L ant neck   . Basal cell carcinoma 11/02/2012   Glabella - excision   . Basal cell carcinoma 08/15/2014   L med ankle   . Basal cell carcinoma 02/23/2017   R lat infrapectoral - excision  . Basal cell carcinoma 12/08/2017   L mid to distal lat volar forearm   . Hypertension   . Squamous cell carcinoma of skin 02/14/2014   R lat low back - ED&C  . Squamous cell carcinoma of skin 10/02/2014   R prox lat bicep - ED&C   Social History   Socioeconomic History  . Marital status: Widowed    Spouse name: Not on file  . Number of  children: Not on file  . Years of education: Not on file  . Highest education level: Not on file  Occupational History  . Not on file  Tobacco Use  . Smoking status: Never Smoker  . Smokeless tobacco: Never Used  Vaping Use  . Vaping Use: Never used  Substance and Sexual Activity  . Alcohol use: Not Currently  . Drug use: Never  . Sexual activity: Not on file  Other Topics Concern  . Not on file  Social History Narrative  . Not on file   Social Determinants of Health   Financial Resource Strain:   . Difficulty of Paying Living Expenses: Not on file  Food Insecurity:   . Worried About Char[employer] fundraiser in the Last Year: Not on file  . Ran Out of Food in the Last Year: Not on file  Transportation Needs:   . Lack of Transportation (Medical): Not on file  . Lack of Transportation (Non-Medical): Not on file  Physical Activity:   . Days of Exercise per Week: Not on file  . Minutes of Exercise per Session: Not on file  Stress:   . Feeling of Stress : Not on file  Social Connections:   . Frequency of Communication with Friends and Family: Not on file  . Frequency of Social Gatherings with Friends and Family: Not on file  . Attends Religious Services: Not on file  . Active Member of Clubs or Organizations: Not on file  . Attends Archivist Meetings: Not on file  . Marital Status: Not on file   History reviewed. No pertinent family history. Scheduled Meds: . enoxaparin (LOVENOX) injection  40 mg Subcutaneous Q24H  . metoprolol tartrate  12.5 mg Oral BID  . pantoprazole (PROTONIX) IV  40 mg Intravenous Q12H  . vancomycin (VANCOCIN) rectal ENEMA  500 mg Rectal Q6H   Continuous Infusions: . sodium chloride 250 mL (08/29/19 0621)  . ampicillin-sulbactam (UNASYN) IV 3 g (08/29/19 1407)  . dextrose 5 % and 0.9 % NaCl with KCl 20 mEq/L 40 mL/hr at 08/27/19 1141  . metronidazole 500 mg (08/29/19 1455)   PRN Meds:.sodium chloride, acetaminophen **OR**  acetaminophen Medications Prior to Admission:  Prior to Admission medications   Medication Sig Start Date End Date Taking? Authorizing Provider  aspirin EC 81 MG tablet Take 1 tablet (81 mg total) by mouth daily. Swallow whole.start 08/19/19 08/08/19 08/07/20 Yes Georgette Shell, MD  atorvastatin (LIPITOR) 20 MG tablet Take 20  mg by mouth daily. 11/20/18  Yes [provider]  hydrochlorothiazide (HYDRODIURIL) 25 MG tablet Take 25 mg by mouth daily.   Yes [provider]  Multiple Vitamin (MULTIVITAMIN WITH MINERALS) TABS tablet Take 1 tablet by mouth daily. 08/09/19  Yes Georgette Shell, MD  ondansetron (ZOFRAN) 4 MG tablet Take 1 tablet (4 mg total) by mouth every 6 (six) hours as needed for nausea. 08/08/19  Yes Georgette Shell, MD  pantoprazole (PROTONIX) 40 MG tablet Take 1 tablet (40 mg total) by mouth daily. 08/09/19  Yes Georgette Shell, MD  celecoxib (CELEBREX) 200 MG capsule Take 200 mg by mouth daily.  Patient not taking: Reported on 08/25/2019    [provider]   No Active Allergies Review of Systems  Physical Exam Vitals and nursing note reviewed.  Constitutional:      Comments: frail  Skin:    General: Skin is warm and dry.  Neurological:     Mental Status: He is alert.     Comments: Hard of hearing  Psychiatric:        Mood and Affect: Mood normal.     Vital Signs: BP 128/67 (BP Location: Right Arm)   Pulse 99   Temp 97.6 F (36.4 C)   Resp 19   Ht '5\' 4"'  (1.626 m)   Wt 69.2 kg   SpO2 100%   BMI 26.18 kg/m  Pain Scale: 0-10   Pain Score: 0-No pain   SpO2: SpO2: 100 % O2 Device:SpO2: 100 % O2 Flow Rate: .O2 Flow Rate (L/min): 0 L/min  IO: Intake/output summary:   Intake/Output Summary (Last 24 hours) at 08/29/2019 1552 Last data filed at 08/29/2019 1048 Gross per 24 hour  Intake 618.71 ml  Output 801 ml  Net -182.29 ml    LBM: Last BM Date: 08/29/19 Baseline Weight: Weight: 69.8 kg Most recent weight: Weight:  69.2 kg     Palliative Assessment/Data: PPS: 40%     Thank you for this consult. Palliative medicine will continue to follow and assist as needed.   Time In: 1500 Time Out: 1610 Time Total: 70 minutes Greater than 50%  of this time was spent counseling and coordinating care related to the above assessment and plan.  Signed by: Mariana Kaufman, AGNP-C Palliative Medicine    Please contact Palliative Medicine Team phone at [phone] for questions and concerns.  For individual provider: See Shea Evans

## 2019-08-29 NOTE — Progress Notes (Signed)
NIF -40 FVC 1.97

## 2019-08-29 NOTE — Progress Notes (Signed)
OT Cancellation Note  Patient Details Name: Albert [name] MRN: [id number] DOB: 1933/10/05   Cancelled Treatment:    Reason Eval/Treat Not Completed: Patient at procedure or test/ unavailable. Upon attempt, therapist preparing to don PPE and RN informed OT that the pt was being prepped for a G -tube procedure and unavailable for OT. Will re-attempt at later date/time as pt is available and medically appropriate.   Jeni Salles, MPH, MS, OTR/L ascom [phone] 08/29/19, 11:17 AM

## 2019-08-29 NOTE — Consult Note (Addendum)
Neurology Consultation Reason for Consult: Swallowing difficulty Referring Physician: Dr. Loletha Grayer  CC: "Food is going into my lungs"  History is obtained from: Patient and chart review   HPI: Albert [name] is a 86 y.o. male with medical history significant for hypertension, hyperlipidemia,  osteopenia, chronic kidney disease stage IIIa, chronic pain secondary to arthritis, recent GI bleeds, atrial arrhthymias (PACs and PVCs per cardiology, no need for [facility]).  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  He reports he has intermittently had some trouble swallowing since then, but it typically improves in a few days and what is different now is that it is not improving.   He has had multiple recent admissions, 7/29-8/3 for GI bleeding, found to have esophageal and duodenal ulcers, 8/4-8/9 for concern for recurrent GI bleed and hypotension.  No source of clear infection was found but he was treated with broad-spectrum antibiotics for 7-[name] course (cefepime, Flagyl, vancomycin, for 5 days and then 2 days of switch to PO cefdinir + flagyl, end date 08/16/2019). He was discharged to rehab, unfortunately he developed recurrent fever and was readmitted on 8/19.  Work-up has shown concern for aspiration pneumonia as well as C. difficile colitis. He has been noted to have a poor cough. Neurology was consulted as the patient failed swallow evaluation to evaluate for potential neurological etiology.  Regarding weight loss, he had Covid in November 2020, and lost 20 pounds per his PCP during his hospitalization (greater than 1 month in hospital/rehab).  He is unable to give me a clear time course of his weight loss, noting only that he was > 200 lbs before his wife passed away.  He does feel like his strength has been improving with PT support in the past few days, corroborated in their notes  He weighed   73.9 kg on 06/21/2015 74.2 kg on 01/15/2016 71.5 kg on 07/15/2016 72 kg on 01/18/2017 68.7 kg on 07/20/2018 69.9 kg on 03/07/2018 65 kg on 09/09/2018,  60 kg at his office visit on 04/10/2019,  69 kg on this admission  ROS: In addition to details above Negative for jumping muscles, worsening of symptoms through the course of the [name], double vision, ptosis, positive for blood in stool and frequent stools as above. He reports sensation is intact when he is being cleaned. Of note due to his being hard of hearing his answers are sometimes inconsistent.   Past Medical History:  Diagnosis Date  . Actinic keratosis 04/23/2008   L elbow distal tricep (bx proven)  . Basal cell carcinoma 06/17/2006   L preauricular   . Basal cell carcinoma 05/26/2012   L ant neck   . Basal cell carcinoma 11/02/2012   Glabella - excision   . Basal cell carcinoma 08/15/2014   L med ankle   . Basal cell carcinoma 02/23/2017   R lat infrapectoral - excision  . Basal cell carcinoma 12/08/2017   L mid to distal lat volar forearm   . Hypertension   . Squamous cell carcinoma of skin 02/14/2014   R lat low back - ED&C  . Squamous cell carcinoma of skin 10/02/2014   R prox lat bicep - ED&C   Family History  No family history on file. He reports no family history of seizures, dementia, strokes  Social History:  reports that he has never smoked. He has never used smokeless tobacco. He reports previous alcohol use. He  reports that he does not use drugs.  He served in [employer] though he reports he never suffered any bullet wounds nor did he have to shoot anyone.  He reports he is "had a good life" and that the best part of his life was being married to his wife who passed away 4-1/2 months after suffering severe heart attack.   Exam: Current vital signs: BP 122/66 (BP Location: Right Arm)   Pulse 87   Temp 97.8 F (36.6 C) (Oral)   Resp 14   Ht '5\' 4"'  (1.626 m)   Wt 69.2 kg   SpO2 95%   BMI 26.18 kg/m  Vital  signs in last 24 hours: Temp:  [97.8 F (36.6 C)-98.5 F (36.9 C)] 97.8 F (36.6 C) (08/24 0334) Pulse Rate:  [87-105] 87 (08/24 0334) Resp:  [14-17] 14 (08/23 2039) BP: (120-126)/(66-74) 122/66 (08/24 0334) SpO2:  [95 %-100 %] 95 % (08/24 0334) Weight:  [69.2 kg] 69.2 kg (08/24 0334)   Physical Exam  Constitutional: Chronically ill-appearing,  Psych: Affect appropriate to situation, speaks kindly to technician complimenting her on her hair color Eyes: No scleral injection HENT: temporal wasting MSK: arthritic changes most notable in the hands.  Cardiovascular: Irregularly irregular Respiratory: Effort normal, non-labored breathing GI: Soft.  No distension.  Skin: Diffuse skin breakdown, including sacral and heel ulcers, diffuse redness of the skin on his back with multiple pressure ulcers on his back as well. Diffuse pitting edema in all limbs including arms.   Neuro: Mental Status: Patient is awake, alert, oriented to person, place, month, year, and situation. Patient is able to give a clear and coherent history. No signs of aphasia or neglect Cranial Nerves: II: Visual Fields are full. Pupils are equal, round, and reactive to light. III,IV, VI: EOMI without ptosis or diploplia.  V: Facial sensation is symmetric to light touch  VII: Facial movement is symmetric.  VIII: hearing is baseline hard of hearing, worse on the right X: Uvula elevates symmetrically XI: Head turn is 5/5 bilaterally  XII: tongue is midline without fasciculations.  Motor: There is diffuse muscle wasting throughout, including temporal wasting and severe atrophy of the hands.  His tone is normal.  He has diffuse weakness including 4 out of 5 head flexion, 3/5 bilateral deltoids, 4/5 elbow flexion, 3/5 elbow extension, pain limited finger extension at least 4-/5, 2/5 bilateral hip flexion, 3/5 knee extension bilaterally, 4/5 knee flexion bilaterally, 4/5 foot dorsi flexion (pain limited), 5/5 foot plantar  flexion. Possible fasciculations in the LUE, difficult to be sure due to edema and baseline physiological tremor Sensory: Sensation is symmetric to light touch and temperature in the arms and legs. Deep Tendon Reflexes: 3+, elicited with finger tapping only in the biceps and brachioradialis Plantars: Upgoing bilaterally Cerebellar: Finger-to-nose is intact bilaterally Gait deferred due to weakness   I have reviewed labs in epic and the results pertinent to this consultation are: Cr 1.01  Mg 1.6  K 3.3  CK was 6 in 11/2018, then 221 08/03/2019  I have reviewed the images obtained:  Head CT Nov 22 2018 Swallow study Aug 28 2019   Impression: This is an 86 year old gentleman with multiple recent hospitalizations. Chart review reveals that his weight has been steadily declining even prior to his bout with COVID-19.  History is a bit challenging given his hearing loss and a tendency to minimize his symptoms.  His examination is notable for diffuse atrophy, head flexion weakness, proximal more than distal muscle weakness,  and upper motor neuron signs including very brisk upper extremity reflexes, and bilateral upgoing toes. This could be a brainstem process, or cervical spinal cord process. Combination of upper and lower motor neuron findings is concerning for the possibility of motor neuron syndrome though this is a bit complicated by his poor nutritional status.    Recommendations: - MRI brain and C-spine with thin cuts through the brainstem - NIF and FVC given head flexion weakness  - Repeat CK, added-on  - ESR, ANA, RF, MMA given borderline low B12 (300),   Albert Noe MD-PhD [facility] [phone]  [facility] coverage for [facility] is from 7 AM to 8 AM by telephone only (no video visit capability at this time), 8 AM to 4 PM in-house, and 4 PM to 8 PM again by telephone only. Please do not hesitate to reach out to me with any questions.

## 2019-08-30 ENCOUNTER — Encounter: Payer: Self-pay | Admitting: Internal Medicine

## 2019-08-30 ENCOUNTER — Inpatient Hospital Stay: Payer: Medicare Other | Admitting: Certified Registered Nurse Anesthetist

## 2019-08-30 ENCOUNTER — Inpatient Hospital Stay: Payer: Medicare Other

## 2019-08-30 ENCOUNTER — Encounter
Admission: EM | Disposition: A | Discharge status: Skilled Nursing Facility | Payer: Self-pay | Source: Home / Self Care | Attending: Family Medicine

## 2019-08-30 DIAGNOSIS — R531 Weakness: Secondary | ICD-10-CM

## 2019-08-30 HISTORY — PX: ESOPHAGOSCOPY: SHX5534

## 2019-08-30 HISTORY — PX: ESOPHAGEAL DILATION: SHX303

## 2019-08-30 LAB — COMPREHENSIVE METABOLIC PANEL
ALT: 14 U/L (ref 0–44)
AST: 36 U/L (ref 15–41)
Albumin: 1.8 g/dL — ABNORMAL LOW (ref 3.5–5.0)
Alkaline Phosphatase: 58 U/L (ref 38–126)
Anion gap: 8 (ref 5–15)
BUN: 17 mg/dL (ref 8–23)
CO2: 22 mmol/L (ref 22–32)
Calcium: 7.3 mg/dL — ABNORMAL LOW (ref 8.9–10.3)
Chloride: 122 mmol/L — ABNORMAL HIGH (ref 98–111)
Creatinine, Ser: 1 mg/dL (ref 0.61–1.24)
GFR calc Af Amer: >60 mL/min (ref >60)
GFR calc non Af Amer: >60 mL/min (ref >60)
Glucose, Bld: 112 mg/dL — ABNORMAL HIGH (ref 70–99)
Potassium: 3.7 mmol/L (ref 3.5–5.1)
Sodium: 152 mmol/L — ABNORMAL HIGH (ref 135–145)
Total Bilirubin: 0.8 mg/dL (ref 0.3–1.2)
Total Protein: 5 g/dL — ABNORMAL LOW (ref 6.5–8.1)

## 2019-08-30 LAB — BASIC METABOLIC PANEL
Anion gap: 11 (ref 5–15)
BUN: 17 mg/dL (ref 8–23)
CO2: 19 mmol/L — ABNORMAL LOW (ref 22–32)
Calcium: 7.7 mg/dL — ABNORMAL LOW (ref 8.9–10.3)
Chloride: 122 mmol/L — ABNORMAL HIGH (ref 98–111)
Creatinine, Ser: 1 mg/dL (ref 0.61–1.24)
GFR calc Af Amer: >60 mL/min (ref >60)
GFR calc non Af Amer: >60 mL/min (ref >60)
Glucose, Bld: 96 mg/dL (ref 70–99)
Potassium: 3.4 mmol/L — ABNORMAL LOW (ref 3.5–5.1)
Sodium: 152 mmol/L — ABNORMAL HIGH (ref 135–145)

## 2019-08-30 LAB — LACTATE DEHYDROGENASE: LDH: 200 U/L — ABNORMAL HIGH (ref 98–192)

## 2019-08-30 LAB — C-REACTIVE PROTEIN: CRP: 2.6 mg/dL — ABNORMAL HIGH (ref <1.0)

## 2019-08-30 LAB — RHEUMATOID FACTOR: Rheumatoid fact SerPl-aCnc: <10 IU/mL (ref 0.0–13.9)

## 2019-08-30 LAB — MAGNESIUM: Magnesium: 1.5 mg/dL — ABNORMAL LOW (ref 1.7–2.4)

## 2019-08-30 LAB — HIV ANTIBODY (ROUTINE TESTING W REFLEX): HIV Screen 4th Generation wRfx: NONREACTIVE

## 2019-08-30 SURGERY — ESOPHAGOSCOPY
Anesthesia: General

## 2019-08-30 MED ORDER — PROMETHAZINE HCL 25 MG/ML IJ SOLN: 6.2500 mg | INTRAMUSCULAR | Status: DC | PRN

## 2019-08-30 MED ORDER — LACTATED RINGERS IV SOLN: INTRAVENOUS | Status: DC

## 2019-08-30 MED ORDER — GADOBUTROL 1 MMOL/ML IV SOLN
6.0000 mL | Freq: Once | INTRAVENOUS | Status: AC | PRN
  Administered 2019-08-30: 6 mL via INTRAVENOUS

## 2019-08-30 MED ORDER — PHENYLEPHRINE HCL (PRESSORS) 10 MG/ML IV SOLN
INTRAVENOUS | Status: DC | PRN
  Administered 2019-08-30 (×2): 100 ug via INTRAVENOUS
  Administered 2019-08-30: 50 ug via INTRAVENOUS

## 2019-08-30 MED ORDER — PROPOFOL 10 MG/ML IV BOLUS
INTRAVENOUS | Status: AC
  Filled 2019-08-30: qty 40

## 2019-08-30 MED ORDER — SUGAMMADEX SODIUM 200 MG/2ML IV SOLN
INTRAVENOUS | Status: DC | PRN
  Administered 2019-08-30: 200 mg via INTRAVENOUS

## 2019-08-30 MED ORDER — PROPOFOL 10 MG/ML IV BOLUS
INTRAVENOUS | Status: DC | PRN
  Administered 2019-08-30: 100 mg via INTRAVENOUS

## 2019-08-30 MED ORDER — LIDOCAINE HCL (CARDIAC) PF 100 MG/5ML IV SOSY
PREFILLED_SYRINGE | INTRAVENOUS | Status: DC | PRN
  Administered 2019-08-30: 50 mg via INTRAVENOUS

## 2019-08-30 MED ORDER — FENTANYL CITRATE (PF) 100 MCG/2ML IJ SOLN: 25.0000 ug | INTRAMUSCULAR | Status: DC | PRN

## 2019-08-30 MED ORDER — DEXAMETHASONE SODIUM PHOSPHATE 10 MG/ML IJ SOLN
INTRAMUSCULAR | Status: DC | PRN
  Administered 2019-08-30: 5 mg via INTRAVENOUS

## 2019-08-30 MED ORDER — FENTANYL CITRATE (PF) 100 MCG/2ML IJ SOLN
INTRAMUSCULAR | Status: AC
  Filled 2019-08-30: qty 2

## 2019-08-30 MED ORDER — ROCURONIUM BROMIDE 100 MG/10ML IV SOLN
INTRAVENOUS | Status: DC | PRN
  Administered 2019-08-30: 40 mg via INTRAVENOUS

## 2019-08-30 MED ORDER — ONDANSETRON HCL 4 MG/2ML IJ SOLN
INTRAMUSCULAR | Status: DC | PRN
  Administered 2019-08-30: 4 mg via INTRAVENOUS

## 2019-08-30 SURGICAL SUPPLY — 16 items
CANISTER SUCT 1200ML W/VALVE (MISCELLANEOUS) ×3 IMPLANT
CNTNR SPEC 2.5X3XGRAD LEK (MISCELLANEOUS) ×1
CONT SPEC 4OZ STER OR WHT (MISCELLANEOUS) ×2
CONTAINER SPEC 2.5X3XGRAD LEK (MISCELLANEOUS) ×1 IMPLANT
COVER BACK TABLE REUSABLE LG (DRAPES) ×3 IMPLANT
COVER WAND RF STERILE (DRAPES) ×3 IMPLANT
DRAPE 3/4 80X56 (DRAPES) ×3 IMPLANT
GAUZE 4X4 16PLY RFD (DISPOSABLE) ×3 IMPLANT
GLOVE PROTEXIS LATEX SZ 7.5 (GLOVE) ×3 IMPLANT
GOWN STRL REUS W/ TWL LRG LVL3 (GOWN DISPOSABLE) ×1 IMPLANT
GOWN STRL REUS W/TWL LRG LVL3 (GOWN DISPOSABLE) ×2
KIT TURNOVER KIT A (KITS) ×3 IMPLANT
SURGILUBE 2OZ TUBE FLIPTOP (MISCELLANEOUS) ×3 IMPLANT
TOWEL OR 17X26 4PK STRL BLUE (TOWEL DISPOSABLE) ×3 IMPLANT
TUBING CONNECTING 10 (TUBING) ×2 IMPLANT
TUBING CONNECTING 10' (TUBING) ×1

## 2019-08-30 NOTE — Anesthesia Procedure Notes (Signed)
Procedure Name: Intubation Date/Time: 08/30/2019 8:38 AM Performed by: Louann Sjogren, CRNA Pre-anesthesia Checklist: Patient identified, Patient being monitored, Timeout performed, Emergency Drugs available and Suction available Patient Re-evaluated:Patient Re-evaluated prior to induction Oxygen Delivery Method: Circle system utilized Preoxygenation: Pre-oxygenation with 100% oxygen Induction Type: IV induction Ventilation: Mask ventilation without difficulty Laryngoscope Size: 3 and McGraph Grade View: Grade I Tube type: Oral Tube size: 7.0 mm Number of attempts: 1 Airway Equipment and Method: Stylet Placement Confirmation: ETT inserted through vocal cords under direct vision,  positive ETCO2 and breath sounds checked- equal and bilateral Secured at: 21 cm Tube secured with: Tape Dental Injury: Teeth and Oropharynx as per pre-operative assessment

## 2019-08-30 NOTE — Progress Notes (Signed)
PROGRESS NOTE    Albert [name]  DQQ:[id number] DOB: 23-Jan-1933 DOA: 08/24/2019 PCP: [name], MD    Brief Narrative: Albert [name] is 86 y.o. male with PMH of CKD, HTN, HLD with recent admission for GI bleed and urosepsis presents from SNF with concerns for fever and hypotensive. Patient endorses cough for 3-4 days with sputum production. Also reports 2-3 weeks of dysuria. Denies urinary urgency or frequency. Denies further melena or blood in stool. No hematemesis or hemoptysis.  Overall presentation is most consistent with acute infection/sepsis, either recurrent UTI/cystitis, versus possible pulmonary etiology including COVID-19 or bacterial pneumonia. I have a lower suspicion for his abnormal vital signs being related to GI bleed given the lack of any hematemesis, recent melena, or other specific symptoms to suggest GI etiology. He is admitted for sepsis secondary to possible aspiration pneumonia and C. difficile colitis.  He is getting antibiotics.  Given dysphagia he underwent thorough evaluation,  neurology thinks it secondary to upper motor neuron lesions but there is concerned about myositis or dermatomyositis,  neurology ordered multiple labs,  recommended close rheumatology  Out patient follow-up.  Patient underwent  upper esophagoscopy with upper esophageal sphincter dilatation.   Assessment & Plan:   Active Problems:   Elevated troponin   Benign essential hypertension   CKD (chronic kidney disease), stage III   Sepsis (HCC)   Lactic acidosis   Anemia   Elevated brain natriuretic peptide (BNP) level   Aspiration pneumonia of right lower lobe due to gastric secretions (HCC)   Dysphagia   C. difficile colitis   Ulcer of esophagus without bleeding   Hypomagnesemia   Hypokalemia   Advanced care planning/counseling discussion   Goals of care, counseling/discussion   1. Sepsis, present on admission with hypotension.  This has improved.  Aspiration pneumonia on Unasyn  (day 4 of 5), C. difficile colitis on vancomycin via NG tube now.  DC IV Flagyl.  2. C. difficile colitis.  Vancomycin via NG tube.  3. Dysphagia with failed swallow evaluation with aspiration on esophagram and modified barium swallow.  Neurology did an NIF which was better with second time that they did it.  They ordered a MRI of the brain and cervical spine to rule out any other etiology of trouble swallowing.   MRI : Findings may be concerning for myositis or muscular injury.  Recommended labs and close outpatient rheumatology follow-up.  ENT patient underwent rigid esophagoscopy with dilatation of the stricture.  Tolerated well..  We will have to check his swallowing again after that.  Interventional radiology can potentially do a PEG feeding tube after completion of treatment for C. difficile colitis.  Currently has NG tube. Can either resume tube feeding or trial of swallowing after EUS dilation.   4. Hyperlipidemia.  Hold atorvastatin.  5. Hypomagnesemia this was replaced recheck electrolytes tomorrow.  6. Hypokalemia replace potassium and IV fluids.  7. Chronic kidney disease stage II:  8. Iron deficiency anemia with prior blood loss, esophageal ulcer on recent endoscopy.  Hemoglobin on the lower side but stable.  9. Smudge cells seen on CBC.  Will need referral to hematology as outpatient for likely underlying CLL.  10. Esophageal ulcer and esophagitis on Protonix.   DVT prophylaxis: Lovenox Code Status:  Full Family Communication: No one at bed side, D/W patient. Disposition Plan: Dispo: The patient is from: Rehab  Anticipated d/c is to: Rehab  Anticipated d/c date is: Yet to be determined secondary the patient will have to have  some way of getting nutrition  Patient currently being treated for aspiration pneumonia and C. difficile colitis.  Also failed swallow evaluation.  They will set up for esophageal upper sphincter dilation tomorrow  tentatively if does okay with neurological testing today.   Consultants:    ENT, Neurology, cardiology  Procedures:    Antimicrobials:  Anti-infectives (From admission, onward)   Start     Dose/Rate Route Frequency Ordered Stop   08/29/19 1800  vancomycin (VANCOCIN) 50 mg/mL oral solution 125 mg        125 mg Per Tube 4 times daily 08/29/19 1641 09/08/19 1759   08/28/19 1500  vancomycin (VANCOCIN) 500 mg in sodium chloride irrigation 0.9 % 100 mL ENEMA  Status:  Discontinued        500 mg Rectal Every 6 hours 08/28/19 1340 08/29/19 1641   08/28/19 1500  metroNIDAZOLE (FLAGYL) IVPB 500 mg  Status:  Discontinued        500 mg 100 mL/hr over 60 Minutes Intravenous Every 8 hours 08/28/19 1341 08/29/19 1642   08/26/19 1400  vancomycin (VANCOCIN) 50 mg/mL oral solution 125 mg  Status:  Discontinued        125 mg Oral 4 times daily 08/26/19 1255 08/28/19 1340   08/25/19 2000  vancomycin (VANCOCIN) IVPB 1000 mg/200 mL premix  Status:  Discontinued        1,000 mg 200 mL/hr over 60 Minutes Intravenous Every 24 hours 08/25/19 0240 08/25/19 1440   08/25/19 2000  doxycycline (VIBRA-TABS) tablet 100 mg  Status:  Discontinued        100 mg Oral Every 12 hours 08/25/19 1440 08/26/19 1254   08/25/19 1800  Ampicillin-Sulbactam (UNASYN) 3 g in sodium chloride 0.9 % 100 mL IVPB        3 g 200 mL/hr over 30 Minutes Intravenous Every 6 hours 08/25/19 1605 08/29/19 2336   08/25/19 1000  ceFEPIme (MAXIPIME) 2 g in sodium chloride 0.9 % 100 mL IVPB  Status:  Discontinued        2 g 200 mL/hr over 30 Minutes Intravenous Every 12 hours 08/25/19 0731 08/25/19 1548   08/25/19 0900  aztreonam (AZACTAM) 1 g in sodium chloride 0.9 % 100 mL IVPB  Status:  Discontinued        1 g 200 mL/hr over 30 Minutes Intravenous Every 8 hours 08/25/19 0228 08/25/19 0729   08/25/19 0015  aztreonam (AZACTAM) 2 g in sodium chloride 0.9 % 100 mL IVPB        2 g 200 mL/hr over 30 Minutes Intravenous  Once 08/25/19 0014  08/25/19 0115   08/25/19 0015  metroNIDAZOLE (FLAGYL) IVPB 500 mg  Status:  Discontinued        500 mg 100 mL/hr over 60 Minutes Intravenous Every 8 hours 08/25/19 0014 08/25/19 1548   08/25/19 0015  vancomycin (VANCOCIN) IVPB 1000 mg/200 mL premix        1,000 mg 200 mL/hr over 60 Minutes Intravenous  Once 08/25/19 0014 08/25/19 0222   08/24/19 2145  ceFEPIme (MAXIPIME) 2 g in sodium chloride 0.9 % 100 mL IVPB        2 g 200 mL/hr over 30 Minutes Intravenous  Once 08/24/19 2134 08/24/19 2222   08/24/19 2145  vancomycin (VANCOCIN) IVPB 1000 mg/200 mL premix        1,000 mg 200 mL/hr over 60 Minutes Intravenous  Once 08/24/19 2134 08/24/19 2251     Subjective: Patient was seen and examined at bedside, Patient  is status post rigid esophagogram with dilatation of the stricture tolerated well.   Patient has NG tube.  Patient states feeling better.  Objective: Vitals:   08/30/19 0845 08/30/19 0850 08/30/19 0933 08/30/19 1200  BP:  121/73 126/77 122/71  Pulse: 94 93 97 78  Resp: 17 18 18 18   Temp:   (!) 97.5 F (36.4 C) 97.8 F (36.6 C)  TempSrc:   Oral Oral  SpO2: 97% 96% 97% 97%  Weight:      Height:        Intake/Output Summary (Last 24 hours) at 08/30/2019 1421 Last data filed at 08/30/2019 0907 Gross per 24 hour  Intake 2957.17 ml  Output 441 ml  Net 2516.17 ml   Filed Weights   08/27/19 0510 08/28/19 0426 08/29/19 0334  Weight: 69.5 kg 69.6 kg 69.2 kg    Examination:  General exam: Appears calm and comfortable  Respiratory system: Clear to auscultation. Respiratory effort normal. Cardiovascular system: S1 & S2 heard, RRR. No JVD, murmurs, rubs, gallops or clicks. No pedal edema. Gastrointestinal system: Abdomen is nondistended, soft and nontender. No organomegaly or masses felt. Normal bowel sounds heard. Central nervous system: Alert and oriented. No focal neurological deficits. Extremities: Symmetric 5 x 5 power. Skin: No rashes, lesions or ulcers Psychiatry:  Judgement and insight appear normal. Mood & affect appropriate.     Data Reviewed: I have personally reviewed following labs and imaging studies  CBC: Recent Labs  Lab 08/24/19 2037 08/25/19 0130 08/26/19 0539 08/27/19 0557 08/28/19 0416  WBC 17.3* 14.6* 9.8 10.0 9.5  NEUTROABS 15.0*  --   --   --   --   HGB 9.1* 9.0* 8.2* 8.4* 8.3*  HCT 29.3* 29.1* 26.1* 26.4* 27.0*  MCV 92.7 94.2 91.6 91.3 91.8  PLT 246 207 176 193 694   Basic Metabolic Panel: Recent Labs  Lab 08/25/19 0130 08/26/19 1423 08/27/19 0557 08/28/19 0416 08/30/19 0607  NA 139 141 143 146* 152*  K 4.5 3.8 3.4* 3.3* 3.4*  CL 111 111 112* 118* 122*  CO2 20* 20* 18* 19* 19*  GLUCOSE 94 83 85 83 96  BUN 24* 26* 23 21 17   CREATININE 1.13 1.16 1.13 1.01 1.00  CALCIUM 7.3* 7.4* 7.3* 7.1* 7.7*  MG  --  1.4*  --  1.6*  --    GFR: Estimated Creatinine Clearance: 46 mL/min (by C-G formula based on SCr of 1 mg/dL). Liver Function Tests: Recent Labs  Lab 08/24/19 2037  AST 35  ALT 15  ALKPHOS 65  BILITOT 1.0  PROT 5.7*  ALBUMIN 2.2*   No results for input(s): LIPASE, AMYLASE in the last 168 hours. No results for input(s): AMMONIA in the last 168 hours. Coagulation Profile: Recent Labs  Lab 08/24/19 2037 08/25/19 0130  INR 1.3* 1.3*   Cardiac Enzymes: Recent Labs  Lab 08/29/19 1338  CKTOTAL 45*   BNP (last 3 results) No results for input(s): PROBNP in the last 8760 hours. HbA1C: No results for input(s): HGBA1C in the last 72 hours. CBG: Recent Labs  Lab 08/26/19 0057 08/26/19 0147 08/26/19 0610  GLUCAP 77 88 69*   Lipid Profile: No results for input(s): CHOL, HDL, LDLCALC, TRIG, CHOLHDL, LDLDIRECT in the last 72 hours. Thyroid Function Tests: No results for input(s): TSH, T4TOTAL, FREET4, T3FREE, THYROIDAB in the last 72 hours. Anemia Panel: No results for input(s): VITAMINB12, FOLATE, FERRITIN, TIBC, IRON, RETICCTPCT in the last 72 hours. Sepsis Labs: Recent Labs  Lab  08/24/19 2037 08/24/19  2255 08/25/19 0130 08/25/19 0131 08/25/19 0703  PROCALCITON  --   --  1.15  --   --   LATICACIDVEN 1.6 3.7*  --  2.9* 1.8    Recent Results (from the past 240 hour(s))  Urine culture     Status: None   Collection Time: 08/24/19  8:37 PM   Specimen: In/Out Cath Urine  Result Value Ref Range Status   Specimen Description   Final    IN/OUT CATH URINE Performed at [facility], [address], [place], [place] [address]    Special Requests   Final    NONE Performed at [facility], [address], [place], [place] [address]    Culture   Final    NO GROWTH Performed at [facility], [place] [address], [place], [place] [address]    Report Status 08/26/2019 FINAL  Final  SARS Coronavirus 2 by RT PCR (hospital order, performed in [facility] hospital lab) Nasopharyngeal Nasopharyngeal Swab     Status: None   Collection Time: 08/24/19  8:53 PM   Specimen: Nasopharyngeal Swab  Result Value Ref Range Status   SARS Coronavirus 2 NEGATIVE NEGATIVE Final    Comment: (NOTE) SARS-CoV-2 target nucleic acids are NOT DETECTED.  The SARS-CoV-2 RNA is generally detectable in upper and lower respiratory specimens during the acute phase of infection. The lowest concentration of SARS-CoV-2 viral copies this assay can detect is 250 copies / mL. A negative result does not preclude SARS-CoV-2 infection and should not be used as the sole basis for treatment or other patient management decisions.  A negative result may occur with improper specimen collection / handling, submission of specimen other than nasopharyngeal swab, presence of viral mutation(s) within the areas targeted by this assay, and inadequate number of viral copies (<250 copies / mL). A negative result must be combined with clinical observations, patient history, and epidemiological information.  Fact Sheet for Patients:   StrictlyIdeas.no  Fact Sheet for  Healthcare Providers: BankingDealers.co.za  This test is not yet approved or  cleared by the [place] FDA and has been authorized for detection and/or diagnosis of SARS-CoV-2 by FDA under an Emergency Use Authorization (EUA).  This EUA will remain in effect (meaning this test can be used) for the duration of the COVID-19 declaration under Section 564(b)(1) of the Act, 21 U.S.C. section 360bbb-3(b)(1), unless the authorization is terminated or revoked sooner.  Performed at [facility], [address] [place], [place] [address]   Blood Culture (routine x 2)     Status: None   Collection Time: 08/24/19  9:00 PM   Specimen: BLOOD  Result Value Ref Range Status   Specimen Description BLOOD BLOOD LEFT FOREARM  Final   Special Requests   Final    BOTTLES DRAWN AEROBIC AND ANAEROBIC Blood Culture results may not be optimal due to an excessive volume of blood received in culture bottles   Culture   Final    NO GROWTH 5 DAYS Performed at [facility], [address] [place],  [address]    Report Status 08/29/2019 FINAL  Final  Blood Culture (routine x 2)     Status: None   Collection Time: 08/24/19  9:05 PM   Specimen: BLOOD  Result Value Ref Range Status   Specimen Description BLOOD BLOOD LEFT HAND  Final   Special Requests   Final    BOTTLES DRAWN AEROBIC AND ANAEROBIC Blood Culture adequate volume   Culture  Final    NO GROWTH 5 DAYS Performed at [facility], [address] [place], [place] [address]    Report Status 08/29/2019 FINAL  Final  MRSA PCR Screening     Status: None   Collection Time: 08/25/19 10:33 PM   Specimen: Nasopharyngeal  Result Value Ref Range Status   MRSA by PCR NEGATIVE NEGATIVE Final    Comment:        The GeneXpert MRSA Assay (FDA approved for NASAL specimens only), is one component of a comprehensive MRSA colonization surveillance program. It is not intended to diagnose  MRSA infection nor to guide or monitor treatment for MRSA infections. Performed at [facility], [place], [place] [address]   C Difficile Quick Screen w PCR reflex     Status: Abnormal   Collection Time: 08/26/19 10:22 AM   Specimen: STOOL  Result Value Ref Range Status   C Diff antigen POSITIVE (A) NEGATIVE Final   C Diff toxin POSITIVE (A) NEGATIVE Final   C Diff interpretation Toxin producing C. difficile detected.  Final    Comment: CRITICAL RESULT CALLED TO, READ BACK BY AND VERIFIED WITH: ANNIE RITCHIE AT 1128 08/26/19.PMF Performed at [facility], [address] [place], [place] [address]          Radiology Studies: MR BRAIN W WO CONTRAST  Result Date: 08/29/2019 CLINICAL DATA:  Initial evaluation for cranial neuropathy. EXAM: MRI HEAD WITHOUT AND WITH CONTRAST MRI CERVICAL SPINE WITHOUT AND WITH CONTRAST TECHNIQUE: Multiplanar, multiecho pulse sequences of the brain and surrounding structures, and cervical spine, to include the craniocervical junction and cervicothoracic junction, were obtained without and with intravenous contrast. An IAC protocol was utilized for the brain portion of this exam. CONTRAST:  20mL GADAVIST GADOBUTROL 1 MMOL/ML IV SOLN COMPARISON:  Prior CT from 11/22/2018. FINDINGS: MRI HEAD FINDINGS Brain: Examination somewhat technically limited by motion artifact. Additionally, no pre or postcontrast T1 weighted imaging of the full brain was performed due to technical air. Thin-section pre and postcontrast T1 weighted sequences through the skull base are available for review. Diffuse prominence of the CSF containing spaces compatible with generalized age-related cerebral atrophy. No significant cerebral white matter disease for age. No abnormal foci of restricted diffusion to suggest acute or subacute ischemia. Gray-white matter differentiation maintained. No encephalomalacia to suggest chronic cortical infarction. No foci of  susceptibility artifact to suggest acute or chronic intracranial hemorrhage. No mass lesion, midline shift, or mass effect. No hydrocephalus or extra-axial fluid collection. Incidental note made of a 5 mm T1 hyperintense lesion at the anterior pituitary gland (series 10, image 11), nonspecific, but possibly a small proteinaceous pars intermedia cyst or Rathke's cleft cyst. Suprasellar region normal. Midline structures intact. Thin section imaging through the internal auditory canals was performed. Seventh and eighth cranial nerves are seen coursing normally through the cerebellopontine angle cisterns into the internal auditory canals. No CPA angle mass. No intracanalicular mass or abnormal enhancement. Inner ear structures including the vestibulae, cochlea, and semi circular canals are normal. No abnormality about either cranial nerve 9 or 10 bilaterally. No appreciable abnormal or pathologic enhancement at the skull base or visualized brain. Prominent bilateral mastoid and middle ear effusions noted, right slightly worse than left. Findings of uncertain significance. Visualized nasopharynx within normal limits. Vascular: Asymmetric FLAIR hyperintensity noted involving the right transverse and sigmoid sinuses without associated T1 correlate, favored to be related to slow flow. Major intracranial vascular flow voids maintained elsewhere within the brain.  Skull and upper cervical spine: Craniocervical junction within normal limits. Degenerative thickening noted about the tectorial membrane and dens without significant stenosis. Bone marrow signal intensity within normal limits. No scalp soft tissue abnormality. Sinuses/Orbits: Globes and orbital soft tissues within normal limits. Mild scattered mucosal thickening noted within the sphenoethmoidal sinuses. Nasogastric tube in place. Visualized paranasal sinuses are otherwise clear. Other: None. MRI CERVICAL SPINE FINDINGS Alignment: Examination somewhat technically  limited by patient positioning and exaggeration of the normal thoracic kyphosis. Vertebral bodies normally aligned with preservation of the normal cervical lordosis. No significant listhesis. Vertebrae: Vertebral body height maintained without acute or chronic fracture. Bone marrow signal intensity within normal limits without discrete or worrisome osseous lesion. No abnormal marrow edema or enhancement. No findings to suggest osteomyelitis discitis or septic arthritis. Cord: Signal intensity within the cervical spinal cord is normal. No abnormal enhancement. Posterior Fossa, vertebral arteries, paraspinal tissues: Craniocervical junction within normal limits. There is diffuse edema with mild enhancement seen involving the upper posterior paraspinous musculature, left greater than right (series 33, image 15 on axial T2 weighted sequence, series 37, image 14 on axial post gad T1 weighted sequence). Finding of uncertain etiology. No loculated collections. Remainder of the visualized paraspinous soft tissues within normal limits. Normal flow voids seen within the vertebral arteries bilaterally. Disc levels: C2-C3: Minimal annular disc bulge. Severe left with mild right facet hypertrophy. No spinal stenosis. Resultant moderate left C3 foraminal stenosis. No significant right foraminal narrowing. C3-C4: Disc desiccation with mild diffuse disc bulge, eccentric to the left. Associated reactive endplate spurring with uncovertebral hypertrophy. Moderate left greater than right facet degeneration. Resultant mild spinal stenosis without cord impingement. Moderate left C4 foraminal stenosis. No significant right foraminal encroachment. C4-C5: Central disc protrusion indents the ventral thecal sac, contacting and mildly flattening the ventral spinal cord (series 34, image 14). Superimposed uncovertebral and facet hypertrophy, greater on the left. Resultant mild-to-moderate spinal stenosis. Severe left with moderate right C5  foraminal narrowing. C5-C6: Degenerative intervertebral disc space narrowing with diffuse disc osteophyte complex. Superimposed facet and ligament flavum hypertrophy. Resultant moderate spinal stenosis with mild cord flattening. Severe bilateral C6 foraminal stenosis. C6-C7: Diffuse disc bulge with bilateral uncovertebral hypertrophy. Mild-to-moderate facet degeneration. Resultant mild spinal stenosis without cord deformity. Moderate bilateral C7 foraminal stenosis. C7-T1: Disc desiccation without significant disc bulge. Bilateral facet hypertrophy. No significant spinal stenosis. Mild right C8 foraminal narrowing. No significant left foraminal encroachment. Visualized upper thoracic spine demonstrates no significant finding. IMPRESSION: MRI HEAD IMPRESSION: 1. No acute intracranial abnormality or structural findings to explain patient's symptoms identified. 2. Prominent bilateral mastoid and middle ear effusions, of uncertain clinical significance. Correlation with physical exam and any potential symptomatology recommended. 3. 5 mm T1 hyperintense lesion within the pituitary gland, indeterminate. No further imaging evaluation or follow-up is necessary. Consider endocrine function tests and correlate for history of pituitary hypersecretion. This follows ACR consensus guidelines: Management of Incidental Pituitary Findings on CT, MRI and F18-FDG PET: A White Paper of the ACR Incidental Findings Committee. J Am Coll Radiol 2018; 15: 703-50. MRI CERVICAL SPINE IMPRESSION: 1. Diffuse edema and enhancement involving the left greater than right upper posterior paraspinous musculature. Finding is indeterminate, and could reflect sequelae of muscular injury/strain. Acute myositis could also be considered, which could be either infectious or inflammatory in nature. No loculated collections. 2. No other acute abnormality within the cervical spine. No cord signal changes to suggest myelopathy. 3. Multilevel cervical  spondylosis with resultant mild to moderate diffuse spinal stenosis at  C3-4 through C6-7, most pronounced at C5-6. Associated moderate to severe bilateral foraminal narrowing at C3 through C7 as above. Electronically Signed   By: Jeannine Boga M.D.   On: 08/29/2019 23:25   MR CERVICAL SPINE W WO CONTRAST  Result Date: 08/29/2019 CLINICAL DATA:  Initial evaluation for cranial neuropathy. EXAM: MRI HEAD WITHOUT AND WITH CONTRAST MRI CERVICAL SPINE WITHOUT AND WITH CONTRAST TECHNIQUE: Multiplanar, multiecho pulse sequences of the brain and surrounding structures, and cervical spine, to include the craniocervical junction and cervicothoracic junction, were obtained without and with intravenous contrast. An IAC protocol was utilized for the brain portion of this exam. CONTRAST:  54mL GADAVIST GADOBUTROL 1 MMOL/ML IV SOLN COMPARISON:  Prior CT from 11/22/2018. FINDINGS: MRI HEAD FINDINGS Brain: Examination somewhat technically limited by motion artifact. Additionally, no pre or postcontrast T1 weighted imaging of the full brain was performed due to technical air. Thin-section pre and postcontrast T1 weighted sequences through the skull base are available for review. Diffuse prominence of the CSF containing spaces compatible with generalized age-related cerebral atrophy. No significant cerebral white matter disease for age. No abnormal foci of restricted diffusion to suggest acute or subacute ischemia. Gray-white matter differentiation maintained. No encephalomalacia to suggest chronic cortical infarction. No foci of susceptibility artifact to suggest acute or chronic intracranial hemorrhage. No mass lesion, midline shift, or mass effect. No hydrocephalus or extra-axial fluid collection. Incidental note made of a 5 mm T1 hyperintense lesion at the anterior pituitary gland (series 10, image 11), nonspecific, but possibly a small proteinaceous pars intermedia cyst or Rathke's cleft cyst. Suprasellar region normal.  Midline structures intact. Thin section imaging through the internal auditory canals was performed. Seventh and eighth cranial nerves are seen coursing normally through the cerebellopontine angle cisterns into the internal auditory canals. No CPA angle mass. No intracanalicular mass or abnormal enhancement. Inner ear structures including the vestibulae, cochlea, and semi circular canals are normal. No abnormality about either cranial nerve 9 or 10 bilaterally. No appreciable abnormal or pathologic enhancement at the skull base or visualized brain. Prominent bilateral mastoid and middle ear effusions noted, right slightly worse than left. Findings of uncertain significance. Visualized nasopharynx within normal limits. Vascular: Asymmetric FLAIR hyperintensity noted involving the right transverse and sigmoid sinuses without associated T1 correlate, favored to be related to slow flow. Major intracranial vascular flow voids maintained elsewhere within the brain. Skull and upper cervical spine: Craniocervical junction within normal limits. Degenerative thickening noted about the tectorial membrane and dens without significant stenosis. Bone marrow signal intensity within normal limits. No scalp soft tissue abnormality. Sinuses/Orbits: Globes and orbital soft tissues within normal limits. Mild scattered mucosal thickening noted within the sphenoethmoidal sinuses. Nasogastric tube in place. Visualized paranasal sinuses are otherwise clear. Other: None. MRI CERVICAL SPINE FINDINGS Alignment: Examination somewhat technically limited by patient positioning and exaggeration of the normal thoracic kyphosis. Vertebral bodies normally aligned with preservation of the normal cervical lordosis. No significant listhesis. Vertebrae: Vertebral body height maintained without acute or chronic fracture. Bone marrow signal intensity within normal limits without discrete or worrisome osseous lesion. No abnormal marrow edema or enhancement.  No findings to suggest osteomyelitis discitis or septic arthritis. Cord: Signal intensity within the cervical spinal cord is normal. No abnormal enhancement. Posterior Fossa, vertebral arteries, paraspinal tissues: Craniocervical junction within normal limits. There is diffuse edema with mild enhancement seen involving the upper posterior paraspinous musculature, left greater than right (series 33, image 15 on axial T2 weighted sequence, series 37, image 14 on  axial post gad T1 weighted sequence). Finding of uncertain etiology. No loculated collections. Remainder of the visualized paraspinous soft tissues within normal limits. Normal flow voids seen within the vertebral arteries bilaterally. Disc levels: C2-C3: Minimal annular disc bulge. Severe left with mild right facet hypertrophy. No spinal stenosis. Resultant moderate left C3 foraminal stenosis. No significant right foraminal narrowing. C3-C4: Disc desiccation with mild diffuse disc bulge, eccentric to the left. Associated reactive endplate spurring with uncovertebral hypertrophy. Moderate left greater than right facet degeneration. Resultant mild spinal stenosis without cord impingement. Moderate left C4 foraminal stenosis. No significant right foraminal encroachment. C4-C5: Central disc protrusion indents the ventral thecal sac, contacting and mildly flattening the ventral spinal cord (series 34, image 14). Superimposed uncovertebral and facet hypertrophy, greater on the left. Resultant mild-to-moderate spinal stenosis. Severe left with moderate right C5 foraminal narrowing. C5-C6: Degenerative intervertebral disc space narrowing with diffuse disc osteophyte complex. Superimposed facet and ligament flavum hypertrophy. Resultant moderate spinal stenosis with mild cord flattening. Severe bilateral C6 foraminal stenosis. C6-C7: Diffuse disc bulge with bilateral uncovertebral hypertrophy. Mild-to-moderate facet degeneration. Resultant mild spinal stenosis without  cord deformity. Moderate bilateral C7 foraminal stenosis. C7-T1: Disc desiccation without significant disc bulge. Bilateral facet hypertrophy. No significant spinal stenosis. Mild right C8 foraminal narrowing. No significant left foraminal encroachment. Visualized upper thoracic spine demonstrates no significant finding. IMPRESSION: MRI HEAD IMPRESSION: 1. No acute intracranial abnormality or structural findings to explain patient's symptoms identified. 2. Prominent bilateral mastoid and middle ear effusions, of uncertain clinical significance. Correlation with physical exam and any potential symptomatology recommended. 3. 5 mm T1 hyperintense lesion within the pituitary gland, indeterminate. No further imaging evaluation or follow-up is necessary. Consider endocrine function tests and correlate for history of pituitary hypersecretion. This follows ACR consensus guidelines: Management of Incidental Pituitary Findings on CT, MRI and F18-FDG PET: A White Paper of the ACR Incidental Findings Committee. J Am Coll Radiol 2018; 15: 188-41. MRI CERVICAL SPINE IMPRESSION: 1. Diffuse edema and enhancement involving the left greater than right upper posterior paraspinous musculature. Finding is indeterminate, and could reflect sequelae of muscular injury/strain. Acute myositis could also be considered, which could be either infectious or inflammatory in nature. No loculated collections. 2. No other acute abnormality within the cervical spine. No cord signal changes to suggest myelopathy. 3. Multilevel cervical spondylosis with resultant mild to moderate diffuse spinal stenosis at C3-4 through C6-7, most pronounced at C5-6. Associated moderate to severe bilateral foraminal narrowing at C3 through C7 as above. Electronically Signed   By: Jeannine Boga M.D.   On: 08/29/2019 23:25   DG [facility]ce Dys Tube Plc W/Fl W/Rad  Result Date: 08/29/2019 CLINICAL DATA:  Oro pharyngeal dysplasia.  Feeding tube placement. EXAM: NASO G  TUBE PLACEMENT WITH FL AND WITH RAD FLUOROSCOPY TIME:  Fluoroscopy Time:  3 minutes and 12 seconds COMPARISON:  None. FINDINGS: Weighted tip feeding tube was placed using fluoroscopic guidance. Water-soluble contrast was administered to confirm positioning at the stomach pylorus/duodenal bulb. IMPRESSION: Weighted tip feeding tube placed using fluoroscopic guidance. Feeding tube is appropriately positioned with tip at the stomach pylorus/duodenal bulb. Electronically Signed   By: Franki Cabot M.D.   On: 08/29/2019 13:20    Scheduled Meds: . enoxaparin (LOVENOX) injection  40 mg Subcutaneous Q24H  . [START ON 09/03/2019] iohexol  500 mL Per Tube Q1H  . metoprolol tartrate  12.5 mg Oral BID  . pantoprazole (PROTONIX) IV  40 mg Intravenous Q12H  . vancomycin  125 mg Per Tube QID  Continuous Infusions: . sodium chloride 250 mL (08/29/19 0621)  . dextrose 5 % and 0.45 % NaCl with KCl 20 mEq/L 50 mL/hr at 08/30/19 1152  . lactated ringers Stopped (08/30/19 0913)     LOS: 5 days    Time spent: 35 mins.    Shawna Clamp, MD [facility]   If 7PM-7AM, please contact night-coverage

## 2019-08-30 NOTE — Progress Notes (Signed)
08/30/2019 7:09 AM  Albert [name] [id number]  Patient preop this morning for rigid esophagoscopy and dilation of the upper esophageal sphincter.    Temp:  [97.6 F (36.4 C)-98 F (36.7 C)] 97.9 F (36.6 C) (08/25 0707) Pulse Rate:  [51-99] 51 (08/25 0706) Resp:  [16-19] 16 (08/25 0706) BP: (110-135)/(67-84) 114/77 (08/25 0706) SpO2:  [98 %-100 %] 98 % (08/25 0706),     Intake/Output Summary (Last 24 hours) at 08/30/2019 0709 Last data filed at 08/30/2019 8381 Gross per 24 hour  Intake 2797.17 ml  Output 401 ml  Net 2396.17 ml    Results for orders placed or performed during the hospital encounter of 08/24/19 (from the past 24 hour(s))  CK     Status: Abnormal   Collection Time: 08/29/19  1:38 PM  Result Value Ref Range   Total CK 45 (L) 49.0 - 397.0 U/L  Sedimentation rate     Status: Abnormal   Collection Time: 08/29/19  1:38 PM  Result Value Ref Range   Sed Rate 65 (H) 0 - 20 mm/hr    SUBJECTIVE: Has had significant blockage at the upper esophageal sphincter leading to aspiration.  He coughs when he swallows and is unable to take much in the line of food or medication  OBJECTIVE: Patient had a modified barium swallow which showed less than 10% of the liquids going into his esophagus.  There never was relaxation of the upper esophageal muscle or opening here to allow reasonable drainage.  There is very little elevation of his larynx as well.  He has had an upper GI endoscopy done couple weeks ago that was tight at the upper esophageal sphincter but no obvious lesions there at that time.  He did have some ulcers at the GE junction that were not bleeding.Marland Kitchen   He has been assessed by neurology with little suspicion of ALS.  He had an MRI scan done last night which shows mild narrowing of his cervical spine so extra care will be taken to make sure there is not significant extension of his neck during the procedure.  IMPRESSION: Tightening of the upper esophageal sphincter with  subsequent aspiration.  He does have a Dobbhoff feeding tube in currently.  PLAN: Upper esophagoscopy to look at the upper esophageal sphincter.  We will make sure there are no lesions or ulcerations here and then do dilation of the upper esophageal sphincter with bougies.  I discussed this again with the patient this morning and he understands and wishes to proceed.  We will plan to do a swallowing evaluation again after this is completed to see if there is any improvement.  If he start swallowing better without signs of aspiration then the Dobbhoff feeding tube can be removed.  He has no further questions.  Informed surgical request side  Huey Romans 08/30/2019, 7:09 AM

## 2019-08-30 NOTE — Op Note (Signed)
08/30/2019  8:01 AM    Albert [name]  [id number]   Pre-Op Dx: Pharyngoesophageal dysphagia with aspiration, possible blockage at the upper esophageal sphincter  Post-op Dx: Pharyngoesophageal dysphagia with aspiration, open clear upper esophageal sphincter  Proc: Rigid upper esophagoscopy, upper esophageal sphincter dilation  Surg:  Huey Romans  Anes:  GOT  EBL: None  Comp: None  Findings: His upper esophageal sphincter appeared to be soft in open easily.  There were no mucosal lesions or irritation anywhere.  No evidence for obstruction.  He was dilated to 63 [place].  He did not have any tightness at all until I reached 64 [place].  There is no bleeding  Procedure: The patient was brought to the operating room and given general anesthesia by oral endotracheal intubation.  His head was supported and held forward some for protecting his cervical spine.  Once the patient was asleep a short 10 x 14 mm esophagoscope was used for visualizing the hypopharynx and esophagus.  The oral endotracheal tube was followed down to the laryngeal inlet.  His Dobbhoff feeding tube was going down his right piriform sinus.  This area was visualized and it led into the esophageal inlet.  The scope was placed into the opening the esophagus and it easily slid open.  The mucosa was very healthy here.  This slid through the upper esophageal sphincter into the upper esophagus.  Mucosa was clear here and there was no sign of any mucus or irritation anywhere.  There is no redness or inflammation.  No swelling.  I only went into the upper esophagus because of the positioning of his head and not being able to extend this.  The scope was removed under direct vision and no problems were noted anywhere.  The lead weighted bougies were then used for dilating the esophagus.  I started with a 24 French dilator.  These were tapered at the end with a rounded blunt tip.  They were lubricated and this passed easily into the  esophagus I went up sequentially with a whole set of dilators, going up to [place] sizes each time.  When I got to 71 [place] this was the first time I felt any slight resistance at all.  I dilated to 5456 and then 69 [place].  That was as large as our dilator set goes.  There is no bleeding coming from the esophagus or any blood staining on the dilators whatsoever.  The dilators were all placed down 20 to 30 cm from the incisors.  They were not placed down through the lower esophageal sphincter as I did not visualize that and had been seen and dilated a couple of weeks ago by GI.  The patient tolerated the procedure well.  He was awakened and taken to the recovery room in satisfactory condition.  There were no operative complications.  Dispo: To PACU to be sent back to his room for further evaluation and treatment  Plan: He did not show significant obstruction of the upper esophageal sphincter.  Dilation may help him but it is not likely that a tight sphincter was his main problem.  Will try a modified swallowing study again tomorrow once his sedation is all worn off to see if there is any benefit here.  Elon Alas Beryle Bagsby  08/30/2019 8:01 AM

## 2019-08-30 NOTE — Transfer of Care (Signed)
Immediate Anesthesia Transfer of Care Note  Patient: Albert [name]  Procedure(s) Performed: ESOPHAGOSCOPY (N/A ) ESOPHAGEAL DILATION (N/A )  Patient Location: PACU  Anesthesia Type:General  Level of Consciousness: awake and alert   Airway & Oxygen Therapy: Patient Spontanous Breathing and Patient connected to face mask oxygen  Post-op Assessment: Report given to RN and Post -op Vital signs reviewed and stable  Post vital signs: Reviewed and stable  Last Vitals:  Vitals Value Taken Time  BP 118/71 08/30/19 0810  Temp 36.2 C 08/30/19 0810  Pulse 95 08/30/19 0817  Resp 13 08/30/19 0817  SpO2 95 % 08/30/19 0817  Vitals shown include unvalidated device data.  Last Pain:  Vitals:   08/30/19 0707  TempSrc: Temporal  PainSc:       Patients Stated Pain Goal: 0 (92/[id number])  Complications: No complications documented.

## 2019-08-30 NOTE — Progress Notes (Addendum)
Neurology Progress Note  Patient ID: Albert [name] is a 86 y.o. male with medical history significant forhypertension, hyperlipidemia,  osteopenia, chronic kidney disease stage IIIa, chronic pain secondary to arthritis, recent GI bleeds, atrial arrhthymias (PACs and PVCs per cardiology, no need for [facility]); neurology was consulted on 8/24 for concern for a neurological cause of his dysphagia  Major interval events/Subjective:  - On RT re-attempt with significant coaching, patient respiratory effort improved significantly; NIF -40; FVC 1.97 - Esophageal dilation completed though no significant obstruction appreciated  - Patient tolerated procedure well - No new complaints, in better spirits today  - He provides significant additional history about his recent symptoms, reporting that about 6 weeks ago he developed a rash on the upper half of his body including his scalp that he states he was told was due to his diet. This was associated with generalized weakness, but on further questioning he does note proximal muscle weakness (getting up out of bed and getting up out of the chair was more challenging than just walking around).  He denies that he had any pain with this rash, although in an ED note by nurse Albert [name] on 08/25/2019 he had pain with all touch about the waist.  Concern for seborrheic dermatitis and excoriations was additionally noted on his admission exam on 7/29 (at which point his generalized weakness was attributed to his acute GI bleed).   Additional family history: He never had any children due to fertility issues with his wife. Had no siblings. Grandparents lived to 5s and 100s as described yesterday. Reports ~25 cousins all healthy hardworking people with no history of muscular or neurological weakness or autoimmune conditions to his knowledge  Given the patient's tendency to minimize symptoms, I called his caretaker /healthcare power of attorney for additional history and background and  spent 20 minutes on the phone call.  He reported that he was doing really well after his stent in rehab post Covid, and with walking better than she had seen him walk in years 01/03/2019).  She noted then in February/March he was becoming more and more sedentary and less and less mobile.  He reported he has a tendency to keep things nearby who he does not get up such as a urinal and snacks, and would not bathe often.  She did notice that he had a bad rash she thinks about a month ago, which he told her was due to sitting on the back then in the sun and getting sunburned, reportedly he said some of his medications made him particularly light sensitive.  She reports at the time his eyelids face and even his jaw was very puffy, and notes that this has been improving over the course of the last month.  She confirms the patient's report that he used to go tanning.  She adds additional history that he told her he he thought it helped his arthritis. She believes he stopped going because he couldn't get in and out of the tanning bed due to knee pain (also his wife Albert [name] passed away in 01/02/2013 and they used to go together).  The last 2 times she visited his house looked like a cyclone hit it, she and her husband did 15-20 loads of laundry and 3-4 loads of dishes.  The most recent time she notes there was diarrhea and poop everywhere; she doesn't think he's lazy (though that's his excuse), she thinks he's too weak to do what he needs to do.  Exam:  Vitals:   08/30/19 0850 08/30/19 0933  BP: 121/73 126/77  Pulse: 93 97  Resp: 18 18  Temp:  (!) 97.5 F (36.4 C)  SpO2: 96% 97%   Constitutional: Chronically ill-appearing,  Psych: Affect appropriate to situation, pleasant and cooperative  Eyes: No scleral injection HENT: temporal wasting, NG tube in place MSK: arthritic changes most notable in the hands, with severe atrophy of bilateral hands Cardiovascular: Irregularly irregular Respiratory: Effort  normal, non-labored breathing Skin: Diffuse skin breakdown, including sacral and heel ulcers, diffuse redness of the skin on his back with multiple pressure ulcers on his back as well.  Redness is present in a shawl-like distribution on his shoulders and torso as well.  Diffuse pitting edema in all limbs including arms remains.   Neuro: Mental Status: Patient is awake, alert, oriented to person, place, month, year, and situation. Patient is able to give a clear and coherent history. No signs of aphasia or neglect Cranial Nerves: II: Visual Fields are full. Pupils are equal, round, and reactive to light. III,IV, VI: EOMI without ptosis or diploplia.  No ptosis on sustained upgaze for greater than 2 months V: Facial sensation is symmetric to light touch  VII: Facial movement is symmetric.  VIII: hearing is baseline hard of hearing, worse on the right X: Uvula elevates symmetrically XI: Head turn is 5/5 bilaterally, head extension is 5 out of 5, but head flexion is only 4 out of 5  XII: tongue is midline without fasciculations.  Motor: There is diffuse muscle wasting throughout, including temporal wasting and severe atrophy of the hands.  His tone is normal.  He has diffuse weakness including 3/5 bilateral deltoids, 4/5 elbow flexion, 3/5 elbow extension, 4/5 finger extension, 5/5 finger flexion; did not repeat my testing of the lower extremities performed on 8/24 (2/5 bilateral hip flexion, 3/5 knee extension bilaterally, 4/5 knee flexion bilaterally, 4/5 foot dorsi flexion (pain limited), 5/5 foot plantar flexion) No fasciculations noted today.  Tremor was not as apparent today either.     Pertinent Labs and diagnostics: Stable today at 1.0 however he seems to have a worsening hypernatremia at 152 (146 on 8/23, 143 on 8/22) CK was 6 in 11/2018, then 221 08/03/2019, 45 on 08/29/2019 ESR elevated to 65  MRI brain and C-spine personally reviewed   Agree with radiology impression: MRI HEAD  IMPRESSION: 1. No acute intracranial abnormality or structural findings to explain patient's symptoms identified. 2. Prominent bilateral mastoid and middle ear effusions, of uncertain clinical significance. Correlation with physical exam and any potential symptomatology recommended. 3. 5 mm T1 hyperintense lesion within the pituitary gland, indeterminate. No further imaging evaluation or follow-up is necessary. Consider endocrine function tests and correlate for history of pituitary hypersecretion. This follows ACR consensus guidelines: Management of Incidental Pituitary Findings on CT, MRI and F18-FDG PET: A White Paper of the ACR Incidental Findings Committee. J Am Coll Radiol 2018; 15: 341-93.  MRI CERVICAL SPINE IMPRESSION: 1. Diffuse edema and enhancement involving the left greater than right upper posterior paraspinous musculature. Finding is indeterminate, and could reflect sequelae of muscular injury/strain. Acute myositis could also be considered, which could be either infectious or inflammatory in nature. No loculated collections. 2. No other acute abnormality within the cervical spine. No cord signal changes to suggest myelopathy. 3. Multilevel cervical spondylosis with resultant mild to moderate diffuse spinal stenosis at C3-4 through C6-7, most pronounced at C5-6. Associated moderate to severe bilateral foraminal narrowing at C3 through C7 as above.  Additionally  personally reviewed CT angio chest abdomen pelvis from 8/4, which did reveal a renal cyst for which renal protocol MRI at 6 months, 12 months and then yearly was recommended;  11/22/2018 CTA chest also personally reviewed and do not see clear evidence of malignancy, most likely changes due to Covid pneumonia he was diagnosed with at the time.   Impression: This is an 86 year old gentleman who is previously fairly healthy and has had a complicated course recently with multiple hospitalizations, GI bleed, C. Diff  infection and aspiration pneumonia. His MRI c-spine completed yesterday likely provides adequate explanation of his upper motor neuron signs on examination. I remain struck by the pattern of muscle weakness on examination with proximal muscle weakness greater than distal muscle weakness (despite diffuse atrophy). MRI also revealed some non-specific muscle changes which can be seen with denervation or inflammation. At this point will start a myositis workup; on the differential are some of the muscular dystrophies with this pattern of weakness but less likely given his age (FSHMD typically symptoms onset in 79s and inheritance is autosomal dominant; OPMD would typically present with ptosis and eye movement issues as well, which he lacks).  Formally motor neuron disease would still be on the differential, but the rash is fairly atypical and seems pretty impressive on history from caregiver.  While his CK is currently low, it was higher when he presented about a month ago, and may be falsely low due to the severe muscle atrophy that he has.  We will check additional muscle enzymes (aldolase, LDH as below).  His ESR elevation may be secondary to muscular inflammation or his general infection currently.  May be useful to trend this as an outpatient, will additionally obtain an CRP here  Drug-induced myopathy is typically seen with statins (triggering HMG CoA reductase antibody production); doubt this patient's significant exposure to alcohol, cocaine, steroids, antimalarials, colchicine or zidovudine.  Patient has not been getting his home statin due to his dysphagia, and would recommend to continue holding this for now.  Malignancy related autoimmune diseases always on the differential as well, but the timeline is made challenging by the patient's minimization of symptoms.  It does seem that he may have had gradually progressive symptoms for years, however given the fact that he was living independently I suspect  that this is actually normal aging with a more subacute decline, which can be characteristic of a malignancy especially given his profound cachexia. Therefore, will additionally obtain renal protocol MRI to follow-up the cyst seen on his recent CTA abdomen/pelvis, as paraneoplastic syndromes associated with renal cell carcinoma can present with cachexia, dermatomyositis and polyneuromyopathy.  Regarding treatment, since he has been making strength gains in the last 3 days with physical therapy and treatment of his infection, I do not think there is urgent need for immunosuppression prior to a diagnosis being confirmed.  I do think he will benefit from very close outpatient follow-up.   Recommendations:  #Generalized weakness, including dysphagia Please hold statin pending further workup (HMG-CoA Reductase antibody result) >All the labs/tests below have been ordered by myself -CRP -Aldolase, LDH, -ANA, SSA, SSB, anti-Smith,  -anti-HMGCR Ab ([facility] send out 2-3 weeks return time),  -myositis panel ([facility] send out 2-3 weeks return time) -HIV -Renal protocol MRI >Additionally will need -Close outpatient Rheumatology follow-up within 2 weeks of discharge  -Close outpatient follow-up with neurology for EMG/NCS and further evaluation  #Pituitary incidentaloma -Work-up per primary team, consider PCP or endocrine follow-up  Lesleigh Noe MD-PhD [facility] [phone]   [facility] coverage for [facility] is from 7 AM to 8 AM by telephone only (no video visit capability at this time), 8 AM to 4 PM in-house, and 4 PM to 8 PM again by telephone only. Please do not hesitate to reach out to me with any questions.

## 2019-08-30 NOTE — Anesthesia Preprocedure Evaluation (Signed)
Anesthesia Evaluation  Patient identified by MRN, date of birth, ID band Patient awake    Reviewed: Allergy & Precautions, H&P , NPO status , Patient's Chart, lab work & pertinent test results  History of Anesthesia Complications Negative for: history of anesthetic complications  Airway Mallampati: II  TM Distance: >3 FB     Dental  (+) Edentulous Lower, Edentulous Upper, Dental Advidsory Given   Pulmonary neg pulmonary ROS,    breath sounds clear to auscultation       Cardiovascular Exercise Tolerance: Good hypertension, (-) angina(-) Past MI and (-) Cardiac Stents (-) dysrhythmias (-) Valvular Problems/Murmurs Rhythm:regular Rate:Normal     Neuro/Psych negative neurological ROS  negative psych ROS   GI/Hepatic Neg liver ROS, PUD, Active GIB   Endo/Other  negative endocrine ROS  Renal/GU Renal disease (AKI)  negative genitourinary   Musculoskeletal   Abdominal   Peds  Hematology  (+) Blood dyscrasia, anemia , Hgb 6.5 and elevated INR.  Has received 3 units pRBC and 2 units FFP 4th unit pRBC hanging now   Anesthesia Other Findings Past Medical History: 04/23/2008: Actinic keratosis     Comment:  L elbow distal tricep (bx proven) 06/17/2006: Basal cell carcinoma     Comment:  L preauricular  05/26/2012: Basal cell carcinoma     Comment:  L ant neck  11/02/2012: Basal cell carcinoma     Comment:  Glabella - excision  08/15/2014: Basal cell carcinoma     Comment:  L med ankle  02/23/2017: Basal cell carcinoma     Comment:  R lat infrapectoral - excision 12/08/2017: Basal cell carcinoma     Comment:  L mid to distal lat volar forearm  No date: Hypertension 02/14/2014: Squamous cell carcinoma of skin     Comment:  R lat low back - ED&C 10/02/2014: Squamous cell carcinoma of skin     Comment:  R prox lat bicep - ED&C  History reviewed. No pertinent surgical history.  BMI    Body Mass Index: 22.71 kg/m       Reproductive/Obstetrics negative OB ROS                             Anesthesia Physical  Anesthesia Plan  ASA: III  Anesthesia Plan: General   Post-op Pain Management:    Induction: Intravenous  PONV Risk Score and Plan: Propofol infusion and TIVA  Airway Management Planned: Oral ETT  Additional Equipment:   Intra-op Plan:   Post-operative Plan: Extubation in OR  Informed Consent: I have reviewed the patients History and Physical, chart, labs and discussed the procedure including the risks, benefits and alternatives for the proposed anesthesia with the patient or authorized representative who has indicated his/her understanding and acceptance.     Dental Advisory Given  Plan Discussed with: Anesthesiologist, CRNA and Surgeon  Anesthesia Plan Comments:         Anesthesia Quick Evaluation

## 2019-08-31 ENCOUNTER — Inpatient Hospital Stay: Payer: Medicare Other

## 2019-08-31 ENCOUNTER — Other Ambulatory Visit: Payer: Self-pay | Admitting: Radiology

## 2019-08-31 DIAGNOSIS — Z515 Encounter for palliative care: Secondary | ICD-10-CM

## 2019-08-31 DIAGNOSIS — M359 Systemic involvement of connective tissue, unspecified: Secondary | ICD-10-CM

## 2019-08-31 LAB — BASIC METABOLIC PANEL
Anion gap: 10 (ref 5–15)
BUN: 19 mg/dL (ref 8–23)
CO2: 19 mmol/L — ABNORMAL LOW (ref 22–32)
Calcium: 7.6 mg/dL — ABNORMAL LOW (ref 8.9–10.3)
Chloride: 122 mmol/L — ABNORMAL HIGH (ref 98–111)
Creatinine, Ser: 1.14 mg/dL (ref 0.61–1.24)
GFR calc Af Amer: >60 mL/min (ref >60)
GFR calc non Af Amer: 59 mL/min — ABNORMAL LOW (ref >60)
Glucose, Bld: 86 mg/dL (ref 70–99)
Potassium: 3.8 mmol/L (ref 3.5–5.1)
Sodium: 151 mmol/L — ABNORMAL HIGH (ref 135–145)

## 2019-08-31 LAB — CBC
HCT: 29.4 % — ABNORMAL LOW (ref 39.0–52.0)
Hemoglobin: 9.4 g/dL — ABNORMAL LOW (ref 13.0–17.0)
MCH: 28.8 pg (ref 26.0–34.0)
MCHC: 32 g/dL (ref 30.0–36.0)
MCV: 90.2 fL (ref 80.0–100.0)
Platelets: 227 10*3/uL (ref 150–400)
RBC: 3.26 MIL/uL — ABNORMAL LOW (ref 4.22–5.81)
RDW: 18 % — ABNORMAL HIGH (ref 11.5–15.5)
WBC: 7.8 10*3/uL (ref 4.0–10.5)
nRBC: 0 % (ref 0.0–0.2)

## 2019-08-31 LAB — MAGNESIUM: Magnesium: 1.8 mg/dL (ref 1.7–2.4)

## 2019-08-31 LAB — PHOSPHORUS: Phosphorus: 3.7 mg/dL (ref 2.5–4.6)

## 2019-08-31 LAB — ANA W/REFLEX IF POSITIVE: Anti Nuclear Antibody (ANA): POSITIVE — AB

## 2019-08-31 LAB — ENA+DNA/DS+ANTICH+CENTRO+JO...
Anti JO-1: <0.2 AI (ref 0.0–0.9)
Centromere Ab Screen: <0.2 AI (ref 0.0–0.9)
Chromatin Ab SerPl-aCnc: <0.2 AI (ref 0.0–0.9)
ENA SM Ab Ser-aCnc: <0.2 AI (ref 0.0–0.9)
Ribonucleic Protein: 0.2 AI (ref 0.0–0.9)
SSA (Ro) (ENA) Antibody, IgG: >8 AI — ABNORMAL HIGH (ref 0.0–0.9)
SSB (La) (ENA) Antibody, IgG: <0.2 AI (ref 0.0–0.9)
Scleroderma (Scl-70) (ENA) Antibody, IgG: <0.2 AI (ref 0.0–0.9)
ds DNA Ab: <1 IU/mL (ref 0–9)

## 2019-08-31 LAB — SJOGRENS SYNDROME-B EXTRACTABLE NUCLEAR ANTIBODY: SSB (La) (ENA) Antibody, IgG: <0.2 AI (ref 0.0–0.9)

## 2019-08-31 LAB — ALDOLASE: Aldolase: 9.6 U/L (ref 3.3–10.3)

## 2019-08-31 LAB — ANTI-SMITH ANTIBODY: ENA SM Ab Ser-aCnc: <0.2 AI (ref 0.0–0.9)

## 2019-08-31 LAB — SJOGRENS SYNDROME-A EXTRACTABLE NUCLEAR ANTIBODY: SSA (Ro) (ENA) Antibody, IgG: >8 AI — ABNORMAL HIGH (ref 0.0–0.9)

## 2019-08-31 MED ORDER — METOPROLOL TARTRATE 5 MG/5ML IV SOLN
2.5000 mg | Freq: Two times a day (BID) | INTRAVENOUS | Status: DC
  Administered 2019-08-31 – 2019-09-10 (×20): 2.5 mg via INTRAVENOUS
  Filled 2019-08-31 (×20): qty 5

## 2019-08-31 MED ORDER — OSMOLITE 1.5 CAL PO LIQD
1000.0000 mL | ORAL | Status: DC
  Administered 2019-08-31 – 2019-09-06 (×5): 1000 mL

## 2019-08-31 MED ORDER — FREE WATER
100.0000 mL | Status: DC
  Administered 2019-08-31 – 2019-09-12 (×52): 100 mL

## 2019-08-31 MED ORDER — PROSOURCE TF PO LIQD
45.0000 mL | Freq: Every day | ORAL | Status: DC
  Administered 2019-09-01 – 2019-09-12 (×7): 45 mL
  Filled 2019-08-31 (×10): qty 45

## 2019-08-31 NOTE — Progress Notes (Signed)
Modified Barium Swallow Progress Note  Patient Details  Name: Albert [name] MRN: [id number] Date of Birth: [birth year]  Today's Date: 08/31/2019  Modified Barium Swallow completed.  Full report located under Chart Review in the Imaging Section.  Brief recommendations include the following:  Clinical Impression  Pt continues to present with severe pharyngoesophageal dysphagia that has worsened since previous MBSS on 08/28/2019. As such he is at an Linda (even with his own salvia). Of note, pt's UES was dilated 08/30/2019. During this study, pt's UES opened less than previous study. Therefore more of EACH BOLUS was SILENTLY ASPIRATED with almost all of the puree bolus was aspirated. Massive amounts of residue remained in throughout the pharynx. Pt was not able to clear with increased swallows. During the repeat swallows, he actually aspirated the residue during the attempt to swallow. Compensatory swallow strategies were attempted but none were effective. Pt attempted to "hock" up pharyngeal residue at the end. He was able to expel some of the bolus but not enough to clear his pharynx. Essentially pt has frank silent aspiration before, during and after the swallow across thin liquids, nectar thick liquids and puree. At this time, it appears that pt's decrease hyolaryngeal excursion and opening of the UES is likely neuromuscular in nature. Recommend long-term nutritional support. Recommend STRICT ORAL CARE to REDUCE bacterial load present in pt's salvia.    Swallow Evaluation Recommendations       SLP Diet Recommendations: NPO       Medication Administration: Via alternative means     Oral Care Recommendations: Oral care QID   Other Recommendations: Remove water pitcher;Prohibited food (jello, ice cream, thin soups);Have oral suction available   [name] B. Rutherford Nail M.S., CCC-SLP, [place] Pathologist Rehabilitation Services Office [place] 08/31/2019,12:39 PM

## 2019-08-31 NOTE — Progress Notes (Signed)
Daily Progress Note   Patient Name: Albert [name]       Date: 08/31/2019 DOB: [birth year]  Age: 86 y.o. MRN#: [id number] Attending Physician: Albert Clamp, MD Primary Care Physician: Albert Pitch, MD Admit Date: 08/24/2019  Reason for Consultation/Follow-up: Establishing goals of care  Subjective: Patient awake- arouses easily. Shares his feelings about his long day today and that his swallow evaluation "did not go well". I attempted to engage him in advanced care planning discussion.  He is agreeable to PEG feeding tube.  He is amenable to rehab.  Attempted to discuss future healthcare decisions that may come up based on evidence of his decline.  He expressed that he had several family members including his wife who had died in this hospital and he did not want to die in the hospital.  Code status was discussed. When I pointed out that if his goal was not to die in the hospital- that undergoing CPR would likely result in him dying in a hospital and would not meet his goal of not dying in a hospital- Albert [name] expressed that he did not want to discuss this further.   Albert [name] was not interested in goals of care discussion and requested no future visits from Palliative Medicine Team.    Length of Stay: 6  Current Medications: Scheduled Meds:  . enoxaparin (LOVENOX) injection  40 mg Subcutaneous Q24H  . [START ON 09/03/2019] iohexol  500 mL Per Tube Q1H  . metoprolol tartrate  2.5 mg Intravenous Q12H  . pantoprazole (PROTONIX) IV  40 mg Intravenous Q12H  . vancomycin  125 mg Per Tube QID    Continuous Infusions: . sodium chloride 250 mL (08/29/19 0621)  . dextrose 5 % and 0.45 % NaCl with KCl 20 mEq/L 50 mL/hr at 08/30/19 1152  . lactated ringers Stopped (08/30/19 0913)     PRN Meds: sodium chloride, acetaminophen **OR** acetaminophen  Physical Exam Vitals and nursing note reviewed.  Constitutional:      Appearance: He is ill-appearing.  Skin:    General: Skin is dry.  Neurological:     Mental Status: He is alert.             Vital Signs: BP 119/66 (BP Location: Right Arm)   Pulse 78   Temp 97.6 F (36.4 C) (Oral)  Resp 16   Ht 5\' 4"  (1.626 m)   Wt 73 kg   SpO2 100%   BMI 27.64 kg/m  SpO2: SpO2: 100 % O2 Device: O2 Device: Room Air O2 Flow Rate: O2 Flow Rate (L/min): 6 L/min  Intake/output summary:   Intake/Output Summary (Last 24 hours) at 08/31/2019 1327 Last data filed at 08/31/2019 1244 Gross per 24 hour  Intake --  Output 300 ml  Net -300 ml   LBM: Last BM Date: 08/30/19 Baseline Weight: Weight: 69.8 kg Most recent weight: Weight: 73 kg       Palliative Assessment/Data: PPS: 10%    Flowsheet Rows     Most Recent Value  Intake Tab  Referral Department Hospitalist  Unit at Time of Referral Med/Surg Unit  Palliative Care Primary Diagnosis Other (Comment)  Date Notified 08/28/19  Palliative Care Type New Palliative care  Reason for referral Clarify Goals of Care  Date of Admission 08/24/19  Date first seen by Palliative Care 08/29/19  # of days Palliative referral response time 1 Day(s)  # of days IP prior to Palliative referral 4  Clinical Assessment  Psychosocial & Spiritual Assessment  Palliative Care Outcomes      Patient Active Problem List   Diagnosis Date Noted  . Advanced care planning/counseling discussion   . Goals of care, counseling/discussion   . Hypomagnesemia   . Hypokalemia   . Aspiration pneumonia of right lower lobe due to gastric secretions ([place])   . Dysphagia   . C. difficile colitis   . Ulcer of esophagus without bleeding   . Lobar pneumonia ([place])   . Wide-complex tachycardia ([place])   . Sepsis ([place]) 08/24/2019  . Lactic acidosis 08/24/2019  . Anemia 08/24/2019  . Elevated brain  natriuretic peptide (BNP) level 08/24/2019  . Protein-calorie malnutrition, severe 08/12/2019  . GIB (gastrointestinal bleeding) 08/10/2019  . GI bleed 08/09/2019  . Acute GI bleeding 08/04/2019  . Symptomatic anemia 08/04/2019  . Coagulopathy ([place]) 08/04/2019  . Metabolic acidosis 40/98/1191  . SIRS (systemic inflammatory response syndrome) ([place]) 08/04/2019  . UGI bleed 08/04/2019  . Dehydration 11/24/2018  . CKD (chronic kidney disease), stage III 11/24/2018  . Generalized weakness 11/22/2018  . Acute renal failure superimposed on stage 3a chronic kidney disease ([place])   . Elevated troponin   . Hyperlipidemia 06/15/2014  . Benign essential hypertension 06/15/2013    Palliative Care Assessment & Plan   Patient Profile: 86 y.o. male  with past medical history of CKD, HTN, HLD, s/p COVID admission winter 2020, recent admission for GI bleeding dx with esophageal and duodenal ulcers, admitted on 08/24/2019 with sepsis r/t aspiration pneumonia and C. Difficile infection. Admission complicated by severe dysphagia, and diagnosis of a fib. ENT and neurology consulted for dysphagia, NG tube has been placed and he is NPO.  He has undergone esophageal dilatation by ENT and is being worked up by Neurology for myositis or other neurological/autoimmune disorder that may be causing his decline. Palliative medicine consulted for goals of care.   Assessment/Recommendations/Plan   Patient noted that he has a living will- I requested that he provide a copy for his chart  Palliative medicine will sign off per patient's request. If patient would like further goals of care discussion please feel free to reconsult  Goals of Care and Additional Recommendations:  Limitations on Scope of Treatment: Full Scope Treatment  Code Status:  Full code  Prognosis:  Unable to determine Discharge Planning:  To Be Determined  Thank you  for allowing the Palliative Medicine Team to assist in the care of this  patient.   Time In: 1458 Time Out: 1336 Total Time 38 minutes Prolonged Time Billed no      Greater than 50%  of this time was spent counseling and coordinating care related to the above assessment and plan.  Albert [name], AGNP-C Palliative Medicine   Please contact Palliative Medicine Team phone at [phone] for questions and concerns.

## 2019-08-31 NOTE — Progress Notes (Signed)
PROGRESS NOTE    Albert [name]  GGE:[id number] DOB: June 22, 1933 DOA: 08/24/2019 PCP: [name], MD    Brief Narrative: Albert [name] is 86 y.o. male with PMH of CKD, HTN, HLD with recent admission for GI bleed and urosepsis presents from SNF with concerns for fever and hypotensive. Patient endorses cough for 3-4 days with sputum production. Also reports 2-3 weeks of dysuria. Denies urinary urgency or frequency. Denies further melena or blood in stool. No hematemesis or hemoptysis.  Overall presentation is most consistent with acute infection/sepsis, either recurrent UTI/cystitis, versus possible pulmonary etiology including COVID-19 or bacterial pneumonia. I have a lower suspicion for his abnormal vital signs being related to GI bleed given the lack of any hematemesis, recent melena, or other specific symptoms to suggest GI etiology. He is admitted for sepsis secondary to possible aspiration pneumonia and C. difficile colitis.  He is getting antibiotics.  Given dysphagia he underwent thorough evaluation,  neurology thinks it secondary to upper motor neuron lesions but there is concerned about myositis or dermatomyositis,  neurology ordered multiple labs,  recommended close rheumatology  Out patient follow-up.  Patient underwent  upper esophagoscopy with upper esophageal sphincter dilatation.  Patient underwent modified barium swallow and had failed, palliative care consulted to discuss goals of care patient want full scope of care.  Plan:  possible PEG tube alternate ways of feeding.   Assessment & Plan:   Active Problems:   Elevated troponin   Benign essential hypertension   CKD (chronic kidney disease), stage III   Sepsis (HCC)   Lactic acidosis   Anemia   Elevated brain natriuretic peptide (BNP) level   Aspiration pneumonia of right lower lobe due to gastric secretions (HCC)   Dysphagia   C. difficile colitis   Ulcer of esophagus without bleeding   Hypomagnesemia    Hypokalemia   Advanced care planning/counseling discussion   Goals of care, counseling/discussion   1. Sepsis, present on admission with hypotension.  This has improved.  Aspiration pneumonia on Unasyn (day 5 of 5), C. difficile colitis on vancomycin via NG tube now.  DC IV Flagyl.  2.  C.difficile colitis.  Vancomycin via NG tube.  3. Dysphagia with failed swallow evaluation with aspiration on esophagram and modified barium swallow.    Neurology did an NIF which was better with second time that they did it.  They ordered a MRI of the brain and cervical spine to rule out any other etiology of  trouble swallowing.   MRI : Findings may be concerning for myositis or muscular injury.  Recommended labs and close outpatient rheumatology follow-up.  ENT patient underwent rigid esophagoscopy with dilatation of the stricture.  Tolerated well..  We will have to check his swallowing again after that.  Interventional radiology can potentially do a PEG feeding tube after completion of treatment for C. difficile colitis.  Currently has NG tube. Can either resume  tube feeding or trial of swallowing after EUS dilation.   Patient failed modified barium swallow, speech recommended n.p.o. alternate ways of feeding.  Palliative consulted to discuss goals of care.  Plan:  patient needs PEG tube after completion of treatment for C. difficile.  Continue NG tube for now.  4. Hyperlipidemia.  Hold atorvastatin.  5. Hypomagnesemia >>> Improved.  this was replaced,  recheck electrolytes tomorrow.  6. Hypokalemia  >>> Improved.  replace potassium as needed  and IV fluids.  7. Chronic kidney disease stage II: Renal functions back to base line.  8. Iron deficiency anemia with prior blood loss, esophageal ulcer on recent endoscopy.  Hemoglobin on the lower side but stable.  9. Smudge cells seen on CBC.  Will need referral to hematology as outpatient for likely underlying CLL.  10. Esophageal ulcer and esophagitis on  Protonix.   DVT prophylaxis: Lovenox Code Status:  Full Family Communication: No one at bed side, D/W patient. Disposition Plan: Dispo: The patient is from: Rehab  Anticipated d/c is to: Rehab  Anticipated d/c date is: Yet to be determined secondary the patient will have to have some way of getting nutrition  Patient currently being treated for aspiration pneumonia and C. difficile colitis.  Also failed swallow evaluation.              Palliative care consulted to discuss goals of care, patient wants full scope of treatment.              Plan patient probably need a PEG tube alternate means of feeding. Consultants:    ENT, Neurology, cardiology  Procedures:    Antimicrobials:  Anti-infectives (From admission, onward)   Start     Dose/Rate Route Frequency Ordered Stop   08/29/19 1800  vancomycin (VANCOCIN) 50 mg/mL oral solution 125 mg        125 mg Per Tube 4 times daily 08/29/19 1641 09/08/19 1759   08/28/19 1500  vancomycin (VANCOCIN) 500 mg in sodium chloride irrigation 0.9 % 100 mL ENEMA  Status:  Discontinued        500 mg Rectal Every 6 hours 08/28/19 1340 08/29/19 1641   08/28/19 1500  metroNIDAZOLE (FLAGYL) IVPB 500 mg  Status:  Discontinued        500 mg 100 mL/hr over 60 Minutes Intravenous Every 8 hours 08/28/19 1341 08/29/19 1642   08/26/19 1400  vancomycin (VANCOCIN) 50 mg/mL oral solution 125 mg  Status:  Discontinued        125 mg Oral 4 times daily 08/26/19 1255 08/28/19 1340   08/25/19 2000  vancomycin (VANCOCIN) IVPB 1000 mg/200 mL premix  Status:  Discontinued        1,000 mg 200 mL/hr over 60 Minutes Intravenous Every 24 hours 08/25/19 0240 08/25/19 1440   08/25/19 2000  doxycycline (VIBRA-TABS) tablet 100 mg  Status:  Discontinued        100 mg Oral Every 12 hours 08/25/19 1440 08/26/19 1254   08/25/19 1800  Ampicillin-Sulbactam (UNASYN) 3 g in sodium chloride 0.9 % 100 mL IVPB        3 g 200 mL/hr over 30 Minutes  Intravenous Every 6 hours 08/25/19 1605 08/29/19 2336   08/25/19 1000  ceFEPIme (MAXIPIME) 2 g in sodium chloride 0.9 % 100 mL IVPB  Status:  Discontinued        2 g 200 mL/hr over 30 Minutes Intravenous Every 12 hours 08/25/19 0731 08/25/19 1548   08/25/19 0900  aztreonam (AZACTAM) 1 g in sodium chloride 0.9 % 100 mL IVPB  Status:  Discontinued        1 g 200 mL/hr over 30 Minutes Intravenous Every 8 hours 08/25/19 0228 08/25/19 0729   08/25/19 0015  aztreonam (AZACTAM) 2 g in sodium chloride 0.9 % 100 mL IVPB        2 g 200 mL/hr over 30 Minutes Intravenous  Once 08/25/19 0014 08/25/19 0115   08/25/19 0015  metroNIDAZOLE (FLAGYL) IVPB 500 mg  Status:  Discontinued        500 mg 100 mL/hr over 60  Minutes Intravenous Every 8 hours 08/25/19 0014 08/25/19 1548   08/25/19 0015  vancomycin (VANCOCIN) IVPB 1000 mg/200 mL premix        1,000 mg 200 mL/hr over 60 Minutes Intravenous  Once 08/25/19 0014 08/25/19 0222   08/24/19 2145  ceFEPIme (MAXIPIME) 2 g in sodium chloride 0.9 % 100 mL IVPB        2 g 200 mL/hr over 30 Minutes Intravenous  Once 08/24/19 2134 08/24/19 2222   08/24/19 2145  vancomycin (VANCOCIN) IVPB 1000 mg/200 mL premix        1,000 mg 200 mL/hr over 60 Minutes Intravenous  Once 08/24/19 2134 08/24/19 2251     Subjective: Patient was seen and examined at bedside, overnight events noted.  Patient failed modified barium swallow, Patient has NG tube, goals of care discussion completed, patient want full scope of treatment. Objective: Vitals:   08/31/19 0504 08/31/19 0838 08/31/19 1133 08/31/19 1536  BP: (!) 135/97 128/76 119/66 (!) 119/56  Pulse: 85 84 78 62  Resp: 20 18 16 16   Temp: 97.6 F (36.4 C) (!) 97.5 F (36.4 C) 97.6 F (36.4 C) (!) 97.5 F (36.4 C)  TempSrc: Oral Oral Oral Oral  SpO2: 99% 97% 100% 100%  Weight: 73 kg     Height:        Intake/Output Summary (Last 24 hours) at 08/31/2019 1540 Last data filed at 08/31/2019 1244 Gross per 24 hour  Intake 0  ml  Output 300 ml  Net -300 ml   Filed Weights   08/28/19 0426 08/29/19 0334 08/31/19 0504  Weight: 69.6 kg 69.2 kg 73 kg    Examination:  General exam: Appears calm and comfortable  Respiratory system: Clear to auscultation. Respiratory effort normal. Cardiovascular system: S1 & S2 heard, RRR. No JVD, murmurs, rubs, gallops or clicks. No pedal edema. Gastrointestinal system: Abdomen is nondistended, soft and nontender. No organomegaly or masses felt. Normal bowel sounds heard. Central nervous system: Alert and oriented. No focal neurological deficits. Extremities: Symmetric 5 x 5 power. Skin: No rashes, lesions or ulcers Psychiatry: Judgement and insight appear normal. Mood & affect appropriate.     Data Reviewed: I have personally reviewed following labs and imaging studies  CBC: Recent Labs  Lab 08/24/19 2037 08/24/19 2037 08/25/19 0130 08/26/19 0539 08/27/19 0557 08/28/19 0416 08/31/19 0604  WBC 17.3*   < > 14.6* 9.8 10.0 9.5 7.8  NEUTROABS 15.0*  --   --   --   --   --   --   HGB 9.1*   < > 9.0* 8.2* 8.4* 8.3* 9.4*  HCT 29.3*   < > 29.1* 26.1* 26.4* 27.0* 29.4*  MCV 92.7   < > 94.2 91.6 91.3 91.8 90.2  PLT 246   < > 207 176 193 214 227   < > = values in this interval not displayed.   Basic Metabolic Panel: Recent Labs  Lab 08/26/19 1423 08/26/19 1423 08/27/19 0557 08/28/19 0416 08/30/19 0607 08/30/19 1403 08/31/19 0604  NA 141   < > 143 146* 152* 152* 151*  K 3.8   < > 3.4* 3.3* 3.4* 3.7 3.8  CL 111   < > 112* 118* 122* 122* 122*  CO2 20*   < > 18* 19* 19* 22 19*  GLUCOSE 83   < > 85 83 96 112* 86  BUN 26*   < > 23 21 17 17 19   CREATININE 1.16   < > 1.13 1.01 1.00 1.00 1.14  CALCIUM 7.4*   < > 7.3* 7.1* 7.7* 7.3* 7.6*  MG 1.4*  --   --  1.6*  --  1.5* 1.8  PHOS  --   --   --   --   --   --  3.7   < > = values in this interval not displayed.   GFR: Estimated Creatinine Clearance: 44.1 mL/min (by C-G formula based on SCr of 1.14 mg/dL). Liver  Function Tests: Recent Labs  Lab 08/24/19 2037 08/30/19 1403  AST 35 36  ALT 15 14  ALKPHOS 65 58  BILITOT 1.0 0.8  PROT 5.7* 5.0*  ALBUMIN 2.2* 1.8*   No results for input(s): LIPASE, AMYLASE in the last 168 hours. No results for input(s): AMMONIA in the last 168 hours. Coagulation Profile: Recent Labs  Lab 08/24/19 2037 08/25/19 0130  INR 1.3* 1.3*   Cardiac Enzymes: Recent Labs  Lab 08/29/19 1338  CKTOTAL 45*   BNP (last 3 results) No results for input(s): PROBNP in the last 8760 hours. HbA1C: No results for input(s): HGBA1C in the last 72 hours. CBG: Recent Labs  Lab 08/26/19 0057 08/26/19 0147 08/26/19 0610  GLUCAP 77 88 69*   Lipid Profile: No results for input(s): CHOL, HDL, LDLCALC, TRIG, CHOLHDL, LDLDIRECT in the last 72 hours. Thyroid Function Tests: No results for input(s): TSH, T4TOTAL, FREET4, T3FREE, THYROIDAB in the last 72 hours. Anemia Panel: No results for input(s): VITAMINB12, FOLATE, FERRITIN, TIBC, IRON, RETICCTPCT in the last 72 hours. Sepsis Labs: Recent Labs  Lab 08/24/19 2037 08/24/19 2255 08/25/19 0130 08/25/19 0131 08/25/19 0703  PROCALCITON  --   --  1.15  --   --   LATICACIDVEN 1.6 3.7*  --  2.9* 1.8    Recent Results (from the past 240 hour(s))  Urine culture     Status: None   Collection Time: 08/24/19  8:37 PM   Specimen: In/Out Cath Urine  Result Value Ref Range Status   Specimen Description   Final    IN/OUT CATH URINE Performed at [facility], [address], [place], [place] [address]    Special Requests   Final    NONE Performed at [facility], [address], [place], [place] [address]    Culture   Final    NO GROWTH Performed at [facility], [place] [address] [place], [place] [address]    Report Status 08/26/2019 FINAL  Final  SARS Coronavirus 2 by RT PCR (hospital order, performed in [facility] hospital lab) Nasopharyngeal Nasopharyngeal Swab     Status: None   Collection  Time: 08/24/19  8:53 PM   Specimen: Nasopharyngeal Swab  Result Value Ref Range Status   SARS Coronavirus 2 NEGATIVE NEGATIVE Final    Comment: (NOTE) SARS-CoV-2 target nucleic acids are NOT DETECTED.  The SARS-CoV-2 RNA is generally detectable in upper and lower respiratory specimens during the acute phase of infection. The lowest concentration of SARS-CoV-2 viral copies this assay can detect is 250 copies / mL. A negative result does not preclude SARS-CoV-2 infection and should not be used as the sole basis for treatment or other patient management decisions.  A negative result may occur with improper specimen collection / handling, submission of specimen other than nasopharyngeal swab, presence of viral mutation(s) within the areas targeted by this assay, and inadequate number of viral copies (<250 copies / mL). A negative result must be combined with clinical observations, patient history, and epidemiological information.  Fact Sheet for  Patients:   StrictlyIdeas.no  Fact Sheet for Healthcare Providers: BankingDealers.co.za  This test is not yet approved or  cleared by the [place] FDA and has been authorized for detection and/or diagnosis of SARS-CoV-2 by FDA under an Emergency Use Authorization (EUA).  This EUA will remain in effect (meaning this test can be used) for the duration of the COVID-19 declaration under Section 564(b)(1) of the Act, 21 U.S.C. section 360bbb-3(b)(1), unless the authorization is terminated or revoked sooner.  Performed at [facility], [address] [place] [place], [place] [address]   Blood Culture (routine x 2)     Status: None   Collection Time: 08/24/19  9:00 PM   Specimen: BLOOD  Result Value Ref Range Status   Specimen Description BLOOD BLOOD LEFT FOREARM  Final   Special Requests   Final    BOTTLES DRAWN AEROBIC AND ANAEROBIC Blood Culture results may not be optimal due to an  excessive volume of blood received in culture bottles   Culture   Final    NO GROWTH 5 DAYS Performed at [facility], [address] [place], [place] [address]    Report Status 08/29/2019 FINAL  Final  Blood Culture (routine x 2)     Status: None   Collection Time: 08/24/19  9:05 PM   Specimen: BLOOD  Result Value Ref Range Status   Specimen Description BLOOD BLOOD LEFT HAND  Final   Special Requests   Final    BOTTLES DRAWN AEROBIC AND ANAEROBIC Blood Culture adequate volume   Culture   Final    NO GROWTH 5 DAYS Performed at [facility], [address], [place], [place] [address]    Report Status 08/29/2019 FINAL  Final  MRSA PCR Screening     Status: None   Collection Time: 08/25/19 10:33 PM   Specimen: Nasopharyngeal  Result Value Ref Range Status   MRSA by PCR NEGATIVE NEGATIVE Final    Comment:        The GeneXpert MRSA Assay (FDA approved for NASAL specimens only), is one component of a comprehensive MRSA colonization surveillance program. It is not intended to diagnose MRSA infection nor to guide or monitor treatment for MRSA infections. Performed at [facility], [place], [place] [address]   C Difficile Quick Screen w PCR reflex     Status: Abnormal   Collection Time: 08/26/19 10:22 AM   Specimen: STOOL  Result Value Ref Range Status   C Diff antigen POSITIVE (A) NEGATIVE Final   C Diff toxin POSITIVE (A) NEGATIVE Final   C Diff interpretation Toxin producing C. difficile detected.  Final    Comment: CRITICAL RESULT CALLED TO, READ BACK BY AND VERIFIED WITH: ANNIE RITCHIE AT 1128 08/26/19.PMF Performed at [facility], [address], [place],  [address]          Radiology Studies: MR BRAIN W WO CONTRAST  Result Date: 08/29/2019 CLINICAL DATA:  Initial evaluation for cranial neuropathy. EXAM: MRI HEAD WITHOUT AND WITH CONTRAST MRI CERVICAL SPINE WITHOUT AND WITH CONTRAST TECHNIQUE:  Multiplanar, multiecho pulse sequences of the brain and surrounding structures, and cervical spine, to include the craniocervical junction and cervicothoracic junction, were obtained without and with intravenous contrast. An IAC protocol was utilized for the brain portion of this exam. CONTRAST:  24mL GADAVIST GADOBUTROL 1 MMOL/ML IV SOLN COMPARISON:  Prior CT from 11/22/2018. FINDINGS: MRI HEAD FINDINGS Brain: Examination somewhat technically limited by motion artifact. Additionally, no pre or  postcontrast T1 weighted imaging of the full brain was performed due to technical air. Thin-section pre and postcontrast T1 weighted sequences through the skull base are available for review. Diffuse prominence of the CSF containing spaces compatible with generalized age-related cerebral atrophy. No significant cerebral white matter disease for age. No abnormal foci of restricted diffusion to suggest acute or subacute ischemia. Gray-white matter differentiation maintained. No encephalomalacia to suggest chronic cortical infarction. No foci of susceptibility artifact to suggest acute or chronic intracranial hemorrhage. No mass lesion, midline shift, or mass effect. No hydrocephalus or extra-axial fluid collection. Incidental note made of a 5 mm T1 hyperintense lesion at the anterior pituitary gland (series 10, image 11), nonspecific, but possibly a small proteinaceous pars intermedia cyst or Rathke's cleft cyst. Suprasellar region normal. Midline structures intact. Thin section imaging through the internal auditory canals was performed. Seventh and eighth cranial nerves are seen coursing normally through the cerebellopontine angle cisterns into the internal auditory canals. No CPA angle mass. No intracanalicular mass or abnormal enhancement. Inner ear structures including the vestibulae, cochlea, and semi circular canals are normal. No abnormality about either cranial nerve 9 or 10 bilaterally. No appreciable abnormal or  pathologic enhancement at the skull base or visualized brain. Prominent bilateral mastoid and middle ear effusions noted, right slightly worse than left. Findings of uncertain significance. Visualized nasopharynx within normal limits. Vascular: Asymmetric FLAIR hyperintensity noted involving the right transverse and sigmoid sinuses without associated T1 correlate, favored to be related to slow flow. Major intracranial vascular flow voids maintained elsewhere within the brain. Skull and upper cervical spine: Craniocervical junction within normal limits. Degenerative thickening noted about the tectorial membrane and dens without significant stenosis. Bone marrow signal intensity within normal limits. No scalp soft tissue abnormality. Sinuses/Orbits: Globes and orbital soft tissues within normal limits. Mild scattered mucosal thickening noted within the sphenoethmoidal sinuses. Nasogastric tube in place. Visualized paranasal sinuses are otherwise clear. Other: None. MRI CERVICAL SPINE FINDINGS Alignment: Examination somewhat technically limited by patient positioning and exaggeration of the normal thoracic kyphosis. Vertebral bodies normally aligned with preservation of the normal cervical lordosis. No significant listhesis. Vertebrae: Vertebral body height maintained without acute or chronic fracture. Bone marrow signal intensity within normal limits without discrete or worrisome osseous lesion. No abnormal marrow edema or enhancement. No findings to suggest osteomyelitis discitis or septic arthritis. Cord: Signal intensity within the cervical spinal cord is normal. No abnormal enhancement. Posterior Fossa, vertebral arteries, paraspinal tissues: Craniocervical junction within normal limits. There is diffuse edema with mild enhancement seen involving the upper posterior paraspinous musculature, left greater than right (series 33, image 15 on axial T2 weighted sequence, series 37, image 14 on axial post gad T1 weighted  sequence). Finding of uncertain etiology. No loculated collections. Remainder of the visualized paraspinous soft tissues within normal limits. Normal flow voids seen within the vertebral arteries bilaterally. Disc levels: C2-C3: Minimal annular disc bulge. Severe left with mild right facet hypertrophy. No spinal stenosis. Resultant moderate left C3 foraminal stenosis. No significant right foraminal narrowing. C3-C4: Disc desiccation with mild diffuse disc bulge, eccentric to the left. Associated reactive endplate spurring with uncovertebral hypertrophy. Moderate left greater than right facet degeneration. Resultant mild spinal stenosis without cord impingement. Moderate left C4 foraminal stenosis. No significant right foraminal encroachment. C4-C5: Central disc protrusion indents the ventral thecal sac, contacting and mildly flattening the ventral spinal cord (series 34, image 14). Superimposed uncovertebral and facet hypertrophy, greater on the left. Resultant mild-to-moderate spinal stenosis. Severe left with  moderate right C5 foraminal narrowing. C5-C6: Degenerative intervertebral disc space narrowing with diffuse disc osteophyte complex. Superimposed facet and ligament flavum hypertrophy. Resultant moderate spinal stenosis with mild cord flattening. Severe bilateral C6 foraminal stenosis. C6-C7: Diffuse disc bulge with bilateral uncovertebral hypertrophy. Mild-to-moderate facet degeneration. Resultant mild spinal stenosis without cord deformity. Moderate bilateral C7 foraminal stenosis. C7-T1: Disc desiccation without significant disc bulge. Bilateral facet hypertrophy. No significant spinal stenosis. Mild right C8 foraminal narrowing. No significant left foraminal encroachment. Visualized upper thoracic spine demonstrates no significant finding. IMPRESSION: MRI HEAD IMPRESSION: 1. No acute intracranial abnormality or structural findings to explain patient's symptoms identified. 2. Prominent bilateral mastoid  and middle ear effusions, of uncertain clinical significance. Correlation with physical exam and any potential symptomatology recommended. 3. 5 mm T1 hyperintense lesion within the pituitary gland, indeterminate. No further imaging evaluation or follow-up is necessary. Consider endocrine function tests and correlate for history of pituitary hypersecretion. This follows ACR consensus guidelines: Management of Incidental Pituitary Findings on CT, MRI and F18-FDG PET: A White Paper of the ACR Incidental Findings Committee. J Am Coll Radiol 2018; 15: 037-04. MRI CERVICAL SPINE IMPRESSION: 1. Diffuse edema and enhancement involving the left greater than right upper posterior paraspinous musculature. Finding is indeterminate, and could reflect sequelae of muscular injury/strain. Acute myositis could also be considered, which could be either infectious or inflammatory in nature. No loculated collections. 2. No other acute abnormality within the cervical spine. No cord signal changes to suggest myelopathy. 3. Multilevel cervical spondylosis with resultant mild to moderate diffuse spinal stenosis at C3-4 through C6-7, most pronounced at C5-6. Associated moderate to severe bilateral foraminal narrowing at C3 through C7 as above. Electronically Signed   By: Jeannine Boga M.D.   On: 08/29/2019 23:25   MR CERVICAL SPINE W WO CONTRAST  Result Date: 08/29/2019 CLINICAL DATA:  Initial evaluation for cranial neuropathy. EXAM: MRI HEAD WITHOUT AND WITH CONTRAST MRI CERVICAL SPINE WITHOUT AND WITH CONTRAST TECHNIQUE: Multiplanar, multiecho pulse sequences of the brain and surrounding structures, and cervical spine, to include the craniocervical junction and cervicothoracic junction, were obtained without and with intravenous contrast. An IAC protocol was utilized for the brain portion of this exam. CONTRAST:  42mL GADAVIST GADOBUTROL 1 MMOL/ML IV SOLN COMPARISON:  Prior CT from 11/22/2018. FINDINGS: MRI HEAD FINDINGS Brain:  Examination somewhat technically limited by motion artifact. Additionally, no pre or postcontrast T1 weighted imaging of the full brain was performed due to technical air. Thin-section pre and postcontrast T1 weighted sequences through the skull base are available for review. Diffuse prominence of the CSF containing spaces compatible with generalized age-related cerebral atrophy. No significant cerebral white matter disease for age. No abnormal foci of restricted diffusion to suggest acute or subacute ischemia. Gray-white matter differentiation maintained. No encephalomalacia to suggest chronic cortical infarction. No foci of susceptibility artifact to suggest acute or chronic intracranial hemorrhage. No mass lesion, midline shift, or mass effect. No hydrocephalus or extra-axial fluid collection. Incidental note made of a 5 mm T1 hyperintense lesion at the anterior pituitary gland (series 10, image 11), nonspecific, but possibly a small proteinaceous pars intermedia cyst or Rathke's cleft cyst. Suprasellar region normal. Midline structures intact. Thin section imaging through the internal auditory canals was performed. Seventh and eighth cranial nerves are seen coursing normally through the cerebellopontine angle cisterns into the internal auditory canals. No CPA angle mass. No intracanalicular mass or abnormal enhancement. Inner ear structures including the vestibulae, cochlea, and semi circular canals are normal. No abnormality about  either cranial nerve 9 or 10 bilaterally. No appreciable abnormal or pathologic enhancement at the skull base or visualized brain. Prominent bilateral mastoid and middle ear effusions noted, right slightly worse than left. Findings of uncertain significance. Visualized nasopharynx within normal limits. Vascular: Asymmetric FLAIR hyperintensity noted involving the right transverse and sigmoid sinuses without associated T1 correlate, favored to be related to slow flow. Major  intracranial vascular flow voids maintained elsewhere within the brain. Skull and upper cervical spine: Craniocervical junction within normal limits. Degenerative thickening noted about the tectorial membrane and dens without significant stenosis. Bone marrow signal intensity within normal limits. No scalp soft tissue abnormality. Sinuses/Orbits: Globes and orbital soft tissues within normal limits. Mild scattered mucosal thickening noted within the sphenoethmoidal sinuses. Nasogastric tube in place. Visualized paranasal sinuses are otherwise clear. Other: None. MRI CERVICAL SPINE FINDINGS Alignment: Examination somewhat technically limited by patient positioning and exaggeration of the normal thoracic kyphosis. Vertebral bodies normally aligned with preservation of the normal cervical lordosis. No significant listhesis. Vertebrae: Vertebral body height maintained without acute or chronic fracture. Bone marrow signal intensity within normal limits without discrete or worrisome osseous lesion. No abnormal marrow edema or enhancement. No findings to suggest osteomyelitis discitis or septic arthritis. Cord: Signal intensity within the cervical spinal cord is normal. No abnormal enhancement. Posterior Fossa, vertebral arteries, paraspinal tissues: Craniocervical junction within normal limits. There is diffuse edema with mild enhancement seen involving the upper posterior paraspinous musculature, left greater than right (series 33, image 15 on axial T2 weighted sequence, series 37, image 14 on axial post gad T1 weighted sequence). Finding of uncertain etiology. No loculated collections. Remainder of the visualized paraspinous soft tissues within normal limits. Normal flow voids seen within the vertebral arteries bilaterally. Disc levels: C2-C3: Minimal annular disc bulge. Severe left with mild right facet hypertrophy. No spinal stenosis. Resultant moderate left C3 foraminal stenosis. No significant right foraminal  narrowing. C3-C4: Disc desiccation with mild diffuse disc bulge, eccentric to the left. Associated reactive endplate spurring with uncovertebral hypertrophy. Moderate left greater than right facet degeneration. Resultant mild spinal stenosis without cord impingement. Moderate left C4 foraminal stenosis. No significant right foraminal encroachment. C4-C5: Central disc protrusion indents the ventral thecal sac, contacting and mildly flattening the ventral spinal cord (series 34, image 14). Superimposed uncovertebral and facet hypertrophy, greater on the left. Resultant mild-to-moderate spinal stenosis. Severe left with moderate right C5 foraminal narrowing. C5-C6: Degenerative intervertebral disc space narrowing with diffuse disc osteophyte complex. Superimposed facet and ligament flavum hypertrophy. Resultant moderate spinal stenosis with mild cord flattening. Severe bilateral C6 foraminal stenosis. C6-C7: Diffuse disc bulge with bilateral uncovertebral hypertrophy. Mild-to-moderate facet degeneration. Resultant mild spinal stenosis without cord deformity. Moderate bilateral C7 foraminal stenosis. C7-T1: Disc desiccation without significant disc bulge. Bilateral facet hypertrophy. No significant spinal stenosis. Mild right C8 foraminal narrowing. No significant left foraminal encroachment. Visualized upper thoracic spine demonstrates no significant finding. IMPRESSION: MRI HEAD IMPRESSION: 1. No acute intracranial abnormality or structural findings to explain patient's symptoms identified. 2. Prominent bilateral mastoid and middle ear effusions, of uncertain clinical significance. Correlation with physical exam and any potential symptomatology recommended. 3. 5 mm T1 hyperintense lesion within the pituitary gland, indeterminate. No further imaging evaluation or follow-up is necessary. Consider endocrine function tests and correlate for history of pituitary hypersecretion. This follows ACR consensus guidelines:  Management of Incidental Pituitary Findings on CT, MRI and F18-FDG PET: A White Paper of the ACR Incidental Findings Committee. J Am Coll Radiol 2018; 15: 078-67. MRI  CERVICAL SPINE IMPRESSION: 1. Diffuse edema and enhancement involving the left greater than right upper posterior paraspinous musculature. Finding is indeterminate, and could reflect sequelae of muscular injury/strain. Acute myositis could also be considered, which could be either infectious or inflammatory in nature. No loculated collections. 2. No other acute abnormality within the cervical spine. No cord signal changes to suggest myelopathy. 3. Multilevel cervical spondylosis with resultant mild to moderate diffuse spinal stenosis at C3-4 through C6-7, most pronounced at C5-6. Associated moderate to severe bilateral foraminal narrowing at C3 through C7 as above. Electronically Signed   By: Jeannine Boga M.D.   On: 08/29/2019 23:25   MR ABDOMEN W WO CONTRAST  Result Date: 08/31/2019 CLINICAL DATA:  Evaluate kidney cyst. EXAM: MRI ABDOMEN WITHOUT AND WITH CONTRAST TECHNIQUE: Multiplanar multisequence MR imaging of the abdomen was performed both before and after the administration of intravenous contrast. CONTRAST:  80mL GADAVIST GADOBUTROL 1 MMOL/ML IV SOLN COMPARISON:  MRI scratch set CT 08/09/2019 FINDINGS: Lower chest: Moderate bilateral pleural effusions with overlying compressive type atelectasis. Hepatobiliary: No focal liver abnormality identified. Tiny stones and/or sludge noted layering within the dependent portion of the gallbladder. No gallbladder wall thickening. Pericholecystic fluid noted. No intrahepatic bile duct dilatation. Normal caliber common bile duct. Pancreas: No mass, inflammatory changes, or other parenchymal abnormality identified. Spleen:  Within normal limits in size and appearance. Adrenals/Urinary Tract: Normal appearance of the adrenal glands. 9 mm calcified lesion arising from the medial cortex of the inferior  pole of the right kidney is identified, image 27/11. Unchanged. No definite solid enhancing component noted. Several subcentimeter cystic lesions are noted within the left kidney. The largest is in the left mid parapelvic region measuring 0.7 cm. These are technically too small to reliably characterize. No hydronephrosis identified bilaterally. Stomach/Bowel: Small hiatal hernia. Stomach is nondistended. No dilated loops of bowel identified. Numerous colonic diverticula noted. Vascular/Lymphatic: Extensive aortic atherosclerosis without aneurysm. The portal vein and splenic vein appear patent. No upper abdominal adenopathy. Other: Small to moderate volume of ascites is identified and appears increased from previous exam. There is mesenteric edema as well as diffuse body wall edema compatible with anasarca. Musculoskeletal: No suspicious bone lesions identified. IMPRESSION: 1. Stable appearance of calcified lesion arising from the medial cortex of the inferior pole of the right kidney. No definite solid enhancing component noted. Finding is favored to represent a benign process. Consider follow-up imaging in 12 months with repeat CT or MRI without and with contrast material. 2. Small to moderate volume of ascites, mesenteric edema and diffuse body wall edema compatible with anasarca. 3. Moderate bilateral pleural effusions with overlying compressive type atelectasis. 4. Small hiatal hernia. 5. Sludge and/or stones identified layering within the gallbladder. No signs of gallbladder wall inflammation or biliary ductal dilatation. 6.  Aortic Atherosclerosis (ICD10-I70.0). Electronically Signed   By: Kerby Moors M.D.   On: 08/31/2019 05:16   DG Loyce Dys Tube Plc W/Fl W/Rad  Result Date: 08/31/2019 CLINICAL DATA:  Dobbhoff tube placement. EXAM: NASO G TUBE PLACEMENT WITH FL AND WITH RAD CONTRAST:  None. FLUOROSCOPY TIME:  Fluoroscopy Time:  5 minutes 12 seconds Radiation Exposure Index (if provided by the  fluoroscopic device): 98.2 mGy COMPARISON:  Prior study 08/29/2019. FINDINGS: Dobbhoff tube was advanced in usual fashion down the esophagus/stomach and into the proximal duodenum under fluoroscopic guidance. There were no complications. Dobbhoff tube secured and patient sent back to his room in stable condition. IMPRESSION: Successful Dobbhoff tube placement under fluoroscopic guidance. Tube tip  positioned in the proximal duodenum. Electronically Signed   By: [place]   On: 08/31/2019 10:14   DG Swallowing Func-Speech Pathology  Result Date: 08/31/2019 Objective Swallowing Evaluation: Type of Study: MBS-Modified Barium Swallow Study  Patient Details Name: Albert [name] MRN: [id number] Date of Birth: July 30, 1933 Today's Date: 08/31/2019 Time: SLP Start Time (ACUTE ONLY): 0900 -SLP Stop Time (ACUTE ONLY): 0924 SLP Time Calculation (min) (ACUTE ONLY): 24 min Past Medical History: Past Medical History: Diagnosis Date  Actinic keratosis 04/23/2008  L elbow distal tricep (bx proven)  Basal cell carcinoma 06/17/2006  L preauricular   Basal cell carcinoma 05/26/2012  L ant neck   Basal cell carcinoma 11/02/2012  Glabella - excision   Basal cell carcinoma 08/15/2014  L med ankle   Basal cell carcinoma 02/23/2017  R lat infrapectoral - excision  Basal cell carcinoma 12/08/2017  L mid to distal lat volar forearm   Hypertension   Squamous cell carcinoma of skin 02/14/2014  R lat low back - ED&C  Squamous cell carcinoma of skin 10/02/2014  R prox lat bicep - ED&C Past Surgical History: Past Surgical History: Procedure Laterality Date  COLONOSCOPY N/A 08/05/2019  Procedure: COLONOSCOPY;  Surgeon: Toledo, Benay Pike, MD;  Location: [facility];  Service: Gastroenterology;  Laterality: N/A;  ESOPHAGEAL DILATION N/A 08/30/2019  Procedure: ESOPHAGEAL DILATION;  Surgeon: Margaretha Sheffield, MD;  Location: [facility];  Service: ENT;  Laterality: N/A;  ESOPHAGOGASTRODUODENOSCOPY N/A 08/05/2019  Procedure:  ESOPHAGOGASTRODUODENOSCOPY (EGD);  Surgeon: Toledo, Benay Pike, MD;  Location: [facility];  Service: Gastroenterology;  Laterality: N/A;  ESOPHAGOSCOPY N/A 08/30/2019  Procedure: ESOPHAGOSCOPY;  Surgeon: Margaretha Sheffield, MD;  Location: [facility] OR[facility];  Service: ENT;  Laterality: N/A; HPI: Albert [name] is a 86 y.o. male with PMH as noted below presents from his facility with fever and low blood pressure today.  Previously he had been admitted for upper GI bleed, EGD performed and pt diagnosed with esophageal and duodenal ulcers at the end of last month.  Chest x-ray indicates chronic appearing increased lung markings with mild right basilar infiltrate. ST consulted as pt reports difficulty swallowing.  Subjective: pt pleasant, conversant, fair historian Assessment / Plan / Recommendation CHL IP CLINICAL IMPRESSIONS 08/31/2019 Clinical Impression Pt continues to present with severe pharyngoesophageal dysphagia that has worsened since previous MBSS on 08/28/2019. As such he is at an [place] (even with his own salvia). Of note, pt's UES was dilated 08/30/2019. During this study, pt's UES opened less than previous study. Therefore more of EACH BOLUS was SILENTLY ASPIRATED with almost all of the puree bolus was aspirated. Massive amounts of residue remained in throughout the pharynx. Pt was not able to clear with increased swallows. During the repeat swallows, he actually aspirated the residue during the attempt to swallow. Compensatory swallow strategies were attempted but none were effective. Pt attempted to "hock" up pharyngeal residue at the end. He was able to expell some of the bolus but not enough to clear his pharynx. Essentially pt has frank silent aspiration before, during and after the swallow across thin liquids, nectar thick liquids and puree. At this time, it appears that pt's decrease hyolaryngeal excursion and opening of the UES is likely neuromuscular in nature. Recommend long-term  nutritional support. Recommend STRICT ORAL CARE to REDUCE bacterial load present in pt's salvia.  SLP Visit Diagnosis Dysphagia, pharyngoesophageal phase (R13.14) Attention and concentration deficit following -- Frontal lobe and executive function deficit following -- Impact on safety and function Severe  aspiration risk;Risk for inadequate nutrition/hydration   CHL IP TREATMENT RECOMMENDATION 08/31/2019 Treatment Recommendations No treatment recommended at this time   Prognosis 08/31/2019 Prognosis for Safe Diet Advancement Guarded Barriers to Reach Goals Severity of deficits Barriers/Prognosis Comment -- CHL IP DIET RECOMMENDATION 08/31/2019 SLP Diet Recommendations NPO Liquid Administration via -- Medication Administration Via alternative means Compensations -- Postural Changes --   CHL IP OTHER RECOMMENDATIONS 08/31/2019 Recommended Consults -- Oral Care Recommendations Oral care QID Other Recommendations Remove water pitcher;Prohibited food (jello, ice cream, thin soups);Have oral suction available   CHL IP FOLLOW UP RECOMMENDATIONS 08/31/2019 Follow up Recommendations Skilled Nursing facility   [facility] IP FREQUENCY AND DURATION 08/28/2019 Speech Therapy Frequency (ACUTE ONLY) min 2x/week Treatment Duration 2 weeks      CHL IP ORAL PHASE 08/31/2019 Oral Phase Impaired Oral - Pudding Teaspoon -- Oral - Pudding Cup -- Oral - Honey Teaspoon -- Oral - Honey Cup -- Oral - Nectar Teaspoon Weak lingual manipulation;Lingual pumping;Reduced posterior propulsion;Piecemeal swallowing;Delayed oral transit;Decreased bolus cohesion;Premature spillage Oral - Nectar Cup -- Oral - Nectar Straw Weak lingual manipulation;Lingual pumping;Reduced posterior propulsion;Piecemeal swallowing;Decreased bolus cohesion;Delayed oral transit;Premature spillage Oral - Thin Teaspoon Weak lingual manipulation;Lingual pumping;Reduced posterior propulsion;Piecemeal swallowing;Delayed oral transit;Decreased bolus cohesion;Premature spillage Oral - Thin Cup  -- Oral - Thin Straw Weak lingual manipulation;Lingual pumping;Reduced posterior propulsion;Piecemeal swallowing;Delayed oral transit;Decreased bolus cohesion;Premature spillage Oral - Puree Weak lingual manipulation;Lingual pumping;Reduced posterior propulsion;Piecemeal swallowing;Delayed oral transit;Decreased bolus cohesion;Premature spillage Oral - Mech Soft -- Oral - Regular -- Oral - Multi-Consistency -- Oral - Pill -- Oral Phase - Comment more impaired over previous MBSS on 08/28/2019  CHL IP PHARYNGEAL PHASE 08/31/2019 Pharyngeal Phase Impaired Pharyngeal- Pudding Teaspoon -- Pharyngeal -- Pharyngeal- Pudding Cup -- Pharyngeal -- Pharyngeal- Honey Teaspoon -- Pharyngeal -- Pharyngeal- Honey Cup -- Pharyngeal -- Pharyngeal- Nectar Teaspoon Delayed swallow initiation-vallecula;Delayed swallow initiation-pyriform sinuses;Reduced epiglottic inversion;Reduced anterior laryngeal mobility;Reduced laryngeal elevation;Reduced airway/laryngeal closure;Penetration/Aspiration before swallow;Penetration/Aspiration during swallow;Penetration/Apiration after swallow;Significant aspiration (Amount);Pharyngeal residue - valleculae;Pharyngeal residue - pyriform;Pharyngeal residue - posterior pharnyx;Pharyngeal residue - cp segment;Reduced pharyngeal peristalsis Pharyngeal Material enters airway, passes BELOW cords without attempt by patient to eject out (silent aspiration) Pharyngeal- Nectar Cup -- Pharyngeal -- Pharyngeal- Nectar Straw Delayed swallow initiation-vallecula;Delayed swallow initiation-pyriform sinuses;Reduced pharyngeal peristalsis;Reduced epiglottic inversion;Reduced anterior laryngeal mobility;Reduced laryngeal elevation;Reduced airway/laryngeal closure;Penetration/Aspiration before swallow;Penetration/Aspiration during swallow;Penetration/Apiration after swallow;Significant aspiration (Amount);Pharyngeal residue - valleculae;Pharyngeal residue - pyriform;Pharyngeal residue - posterior pharnyx;Pharyngeal  residue - cp segment Pharyngeal Material enters airway, passes BELOW cords without attempt by patient to eject out (silent aspiration) Pharyngeal- Thin Teaspoon Delayed swallow initiation-vallecula;Delayed swallow initiation-pyriform sinuses;Reduced pharyngeal peristalsis;Reduced epiglottic inversion;Reduced anterior laryngeal mobility;Reduced laryngeal elevation;Reduced airway/laryngeal closure;Penetration/Aspiration before swallow;Penetration/Aspiration during swallow;Penetration/Apiration after swallow;Significant aspiration (Amount);Pharyngeal residue - valleculae;Pharyngeal residue - pyriform;Pharyngeal residue - posterior pharnyx;Pharyngeal residue - cp segment Pharyngeal Material enters airway, passes BELOW cords without attempt by patient to eject out (silent aspiration) Pharyngeal- Thin Cup NT Pharyngeal -- Pharyngeal- Thin Straw NT Pharyngeal -- Pharyngeal- Puree Delayed swallow initiation-vallecula;Delayed swallow initiation-pyriform sinuses;Reduced pharyngeal peristalsis;Reduced epiglottic inversion;Reduced anterior laryngeal mobility;Reduced laryngeal elevation;Reduced airway/laryngeal closure;Penetration/Aspiration before swallow;Penetration/Aspiration during swallow;Penetration/Apiration after swallow;Significant aspiration (Amount);Pharyngeal residue - valleculae;Pharyngeal residue - pyriform;Pharyngeal residue - posterior pharnyx;Pharyngeal residue - cp segment Pharyngeal Material enters airway, passes BELOW cords without attempt by patient to eject out (silent aspiration) Pharyngeal- Mechanical Soft -- Pharyngeal -- Pharyngeal- Regular -- Pharyngeal -- Pharyngeal- Multi-consistency -- Pharyngeal -- Pharyngeal- Pill -- Pharyngeal -- Pharyngeal Comment --  CHL IP CERVICAL ESOPHAGEAL PHASE 08/31/2019 Cervical Esophageal Phase Impaired Pudding Teaspoon -- Pudding Cup --  Honey Teaspoon -- Honey Cup -- Nectar Teaspoon Reduced cricopharyngeal relaxation Nectar Cup -- Nectar Straw Reduced cricopharyngeal  relaxation Thin Teaspoon Reduced cricopharyngeal relaxation Thin Cup NT Thin Straw NT Puree Reduced cricopharyngeal relaxation Mechanical Soft -- Regular -- Multi-consistency -- Pill -- Cervical Esophageal Comment less UES opening for bolus than previous study on 08/28/2019 Happi B. Rutherford Nail, M.S., CCC-SLP, [place] [facility] [phone] Stormy Fabian 08/31/2019, 12:47 PM               Scheduled Meds:  enoxaparin (LOVENOX) injection  40 mg Subcutaneous Q24H   [START ON 09/01/2019] feeding supplement (PROSource TF)  45 mL Per Tube Daily   free water  100 mL Per Tube Q4H   [START ON 09/03/2019] iohexol  500 mL Per Tube Q1H   metoprolol tartrate  2.5 mg Intravenous Q12H   pantoprazole (PROTONIX) IV  40 mg Intravenous Q12H   vancomycin  125 mg Per Tube QID   Continuous Infusions:  sodium chloride 250 mL (08/29/19 0621)   dextrose 5 % and 0.45 % NaCl with KCl 20 mEq/L 50 mL/hr at 08/31/19 1341   feeding supplement (OSMOLITE 1.5 CAL)       LOS: 6 days    Time spent: 25 mins.    Shawna Clamp, MD [facility]   If 7PM-7AM, please contact night-coverage

## 2019-08-31 NOTE — Anesthesia Postprocedure Evaluation (Signed)
Anesthesia Post Note  Patient: Albert [name]  Procedure(s) Performed: ESOPHAGOSCOPY (N/A ) ESOPHAGEAL DILATION (N/A )  Patient location during evaluation: PACU Anesthesia Type: General Level of consciousness: awake and alert Pain management: pain level controlled Vital Signs Assessment: post-procedure vital signs reviewed and stable Respiratory status: spontaneous breathing, nonlabored ventilation, respiratory function stable and patient connected to nasal cannula oxygen Cardiovascular status: blood pressure returned to baseline and stable Postop Assessment: no apparent nausea or vomiting Anesthetic complications: no   No complications documented.   Last Vitals:  Vitals:   08/30/19 2045 08/31/19 0504  BP: 120/68 (!) 135/97  Pulse: 100 85  Resp: 20 20  Temp: 36.9 C 36.4 C  SpO2: 97% 99%    Last Pain:  Vitals:   08/31/19 0504  TempSrc: Oral  PainSc:                  Martha Clan

## 2019-08-31 NOTE — Progress Notes (Addendum)
Neurology Progress Note  Patient ID: Albert [name] is a 86 y.o. male with medical history significant forhypertension, hyperlipidemia,  osteopenia, chronic kidney disease stage IIIa, chronic pain secondary to arthritis, recent GI bleeds, atrial arrhthymias (PACs and PVCs per cardiology, no need for [facility]); neurology was consulted on 8/24 for concern for a neurological cause of his dysphagia  Major interval events/Subjective:  - Failed repeat swallow eval with significant worsening since prior eval 3 days ago   Exam: Vitals:   08/30/19 2045 08/31/19 0504  BP: 120/68 (!) 135/97  Pulse: 100 85  Resp: 20 20  Temp: 98.4 F (36.9 C) 97.6 F (36.4 C)  SpO2: 97% 99%   Constitutional: Chronically ill-appearing,  Psych: Affect appropriate to situation, pleasant and cooperative  Eyes: No scleral injection HENT: temporal wasting, NG tube in place MSK: arthritic changes most notable in the hands, with severe atrophy of bilateral hands Cardiovascular: Irregularly irregular Respiratory: Effort normal, non-labored breathing  Neuro: Mental Status: Patient is awake, alert, oriented to person, place, month, year, and situation. Cranial Nerves: II: Pupils are equal, round, and reactive to light. VII: Facial movement is symmetric.  VIII: hearing is baseline hard of hearing, worse on the right Motor: There is diffuse muscle wasting throughout, including temporal wasting and severe atrophy of the hands. No fasciculations noted today.  Tremor was not as apparent today either.   He declined formal strength testing today, from 8/25 He has diffuse weakness including 3/5 bilateral deltoids, 4/5 elbow flexion, 3/5 elbow extension, 4/5 finger extension, 5/5 finger flexion; did not repeat my testing of the lower extremities performed on 8/24 (2/5 bilateral hip flexion, 3/5 knee extension bilaterally, 4/5 knee flexion bilaterally, 4/5 foot dorsi flexion (pain limited), 5/5 foot plantar flexion)   Pertinent  Labs and diagnostics: Cr uptrending today today at 1.19  Stable hypernatremia at 151 (146 on 8/23, 143 on 8/22, 152 on 8/25) CK was 6 in 11/2018, then 221 08/03/2019, 45 on 08/29/2019 Aldolase 9.6 Anti-SM neg, SSB neg, SSA > 8.0, ANA Positive  ESR elevated to 65 HIV resulted negative LDH 200 (ref range 98-192) CRP 2.6 (ref range < 1.0)  MRI Abdomen 8/25, personally reviewed, agree with radiology read IMPRESSION: 1. Stable appearance of calcified lesion arising from the medial cortex of the inferior pole of the right kidney. No definite solid enhancing component noted. Finding is favored to represent a benign process. Consider follow-up imaging in 12 months with repeat CT or MRI without and with contrast material. 2. Small to moderate volume of ascites, mesenteric edema and diffuse body wall edema compatible with anasarca. 3. Moderate bilateral pleural effusions with overlying compressive type atelectasis. 4. Small hiatal hernia. 5. Sludge and/or stones identified layering within the gallbladder. No signs of gallbladder wall inflammation or biliary ductal dilatation. 6.  Aortic Atherosclerosis (ICD10-I70.0).  MRI brain and C-spine personally reviewed 8/24    Agree with radiology impression: MRI HEAD IMPRESSION: 1. No acute intracranial abnormality or structural findings to explain patient's symptoms identified. 2. Prominent bilateral mastoid and middle ear effusions, of uncertain clinical significance. Correlation with physical exam and any potential symptomatology recommended. 3. 5 mm T1 hyperintense lesion within the pituitary gland, indeterminate. No further imaging evaluation or follow-up is necessary. Consider endocrine function tests and correlate for history of pituitary hypersecretion. This follows ACR consensus guidelines: Management of Incidental Pituitary Findings on CT, MRI and F18-FDG PET: A White Paper of the ACR Incidental Findings Committee. J Am Coll Radiol  2018; 15: 124-58.  MRI  CERVICAL SPINE IMPRESSION: 1. Diffuse edema and enhancement involving the left greater than right upper posterior paraspinous musculature. Finding is indeterminate, and could reflect sequelae of muscular injury/strain. Acute myositis could also be considered, which could be either infectious or inflammatory in nature. No loculated collections. 2. No other acute abnormality within the cervical spine. No cord signal changes to suggest myelopathy. 3. Multilevel cervical spondylosis with resultant mild to moderate diffuse spinal stenosis at C3-4 through C6-7, most pronounced at C5-6. Associated moderate to severe bilateral foraminal narrowing at C3 through C7 as above.  8/4 CT angio chest abdomen pelvis, which did reveal a renal cyst for which renal protocol MRI at 6 months, 12 months and then yearly was recommended;  11/22/2018 CTA chest also personally reviewed and do not see clear evidence of malignancy, most likely changes due to Covid pneumonia he was diagnosed with at the time.   Impression: This is an 86 year old gentleman who is previously fairly healthy and has had a complicated course recently with multiple hospitalizations, GI bleed, C. Diff infection and aspiration pneumonia. His MRI c-spine completed yesterday likely provides adequate explanation of his upper motor neuron signs on examination. I remain struck by the pattern of muscle weakness on examination with proximal muscle weakness greater than distal muscle weakness and diffuse atrophy.   Workup to-date, summarized above, is notable for a positive SSA, and ANA suggestive that there may indeed be an autoimmune process at play. Extensive imaging has not revealed malignancy (though CT chest is from November so if patient had desire to pursue all aggressive measures, repeating this study would be reasonable). To confirm possible diagnosis of myositis / dermatomyositis vs. muscular dystrophy (less likely), will  need EMG/NCS and muscle biopsy which cannot be performed inpatient at [facility]. Treatment, in the case of an autoimmune disease, would also require immunosuppression which is complicated given his current multiple infections. Given the hesitancy he has described towards being in the hospital, further goals of care discussions are needed.    Recommendations:  #Generalized weakness, including dysphagia - Please hold statin pending further workup (HMG-CoA Reductase antibody result) >Pending labs/tests below have been ordered by myself -anti-HMGCR Ab ([facility] send out 2-3 weeks return time),  -myositis panel ([facility] send out 2-3 weeks return time) >Additional support needed, not available at [place]: - Rheumatology evaluation - EMG/NCS and muscle biopsy   #Pituitary incidentaloma -Work-up per primary team, consider PCP or endocrine follow-up  # Hypernatremia - Stable, possibly secondary to poor intake while NPO, appreciate primary team adjustment of tube feeds as needed  Lesleigh Noe MD-PhD [facility] [phone]   [facility] coverage for [facility] is from 7 AM to 8 AM by telephone only (no video visit capability at this time), 8 AM to 4 PM in-house, and 4 PM to 8 PM again by telephone only. Please do not hesitate to reach out to me with any questions.

## 2019-08-31 NOTE — Progress Notes (Addendum)
Palliative-  Additional non-face to face time: 53 minutes  Patient discussed with Albert [name]. There is concern of an autoimmune process - possibly myositis/dermatomyosits. To continue aggressive treatment patient would need further testing, muscle biopsy, and treatment would include aggressive immunosuppression which would be complicated due to patient's multiple infections and high risk of recurrent aspiration pneumonia due to dysphagia- even if he has PEG tube placed.  Patient has frequently avoided medical care and has expressed frequent frustration during his hospital stay.  Additionally, if his EOL goal is to not die in the hospital- continued workup and aggressive care would make this experience at EOL unlikely.  I called his [place] and discussed the above with her as well as the difficulty of discussing goals of care with patient himself. Albert [name] agreed that if patient could truly comprehend his state and options he would likely choose comfort.  Plan made to meet with patient tomorrow at 2pm with Albert [name] present. Albert [name] notified and she will try to attend if possible.   PMT consult re-entered per Dr. Lyn [name] request.   Albert [name], AGNP-C Palliative Medicine  Please call Palliative Medicine team phone with any questions [phone]. For individual providers please see AMION.

## 2019-08-31 NOTE — Progress Notes (Addendum)
Pt refused thin barium, patient states "He has had this problem before and he believes that by walking he will be fine. I explained to him that his PEG tube placement is schedule for tomorrow per Specials nurse, per patient he does not want the PEG tube anymore. Pt states " I just want to go home , taste some food, and die". MD aware. Per Dr. Elenore Paddy do not stop feeding tonight, will cancel PEG  tomorrow, and can do later if wanted.

## 2019-08-31 NOTE — Progress Notes (Signed)
Initial Nutrition Assessment  DOCUMENTATION CODES:   Severe malnutrition in context of chronic illness  INTERVENTION:   Osmolite 1.5 @ 64ml/hr + Pro-Source 32ml daily via tube  Free water flushes 161ml q4 hours  Regimen provides 1840kcal/day, 86g/day protein and 1568ml/day free water  Pt at high refeed risk; recommend monitor K, Mg and P labs daily until stable  NUTRITION DIAGNOSIS:   Severe Malnutrition related to chronic illness (dysphagia, recent COVID 19, CKD) as evidenced by severe fat depletion, severe muscle depletion.  GOAL:   Patient will meet greater than or equal to 90% of their needs  MONITOR:   Labs, Weight trends, TF tolerance, Skin, I & O's  REASON FOR ASSESSMENT:   NPO/Clear Liquid Diet    ASSESSMENT:   86 y.o. male  with past medical history of CKD, HTN, HLD, s/p COVID admission winter 2020, recent admission for GI bleeding dx with esophageal and duodenal ulcers, admitted on 08/24/2019 with sepsis r/t aspiration pneumonia and C. Difficile infection.   Pt s/p rigid upper esophagoscopy, upper esophageal sphincter dilation 8/25  Pt is familiar to nutrition department from a  recent previous admit. Pt with poor appetite and oral intake at baseline. Pt unable to be placed on a diet r/t dysphgia; pt failed MBSS today. Pt s/p fluoroscopy guided nasogastric tube placement today; tube tip noted in proximal duodenum. Will plan to initiate tube feeds. Pt is at refeed risk. Plan is for IR G-tube placement on 8/30.   Per chart, pt appears fairly weight stable pta. Pt reports that he used to weigh 200lbs many years ago but has been slowly losing weight over time.   Medications reviewed and include: lovenox, protonix, vancomycin, NaCl w/ 5% dextrose and KCl @50ml /hr  Labs reviewed: Na 151(H), P 3.7 wnl, Mg 1.8 wnl Hgb 9.4(L), Hct 29.4(L)  Nutrition-Focused physical exam completed. Findings are severe fat depletions, severe muscle depletions, and no edema.   Diet  Order:   Diet Order            Diet NPO time specified  Diet effective now                EDUCATION NEEDS:   No education needs have been identified at this time  Skin:  Skin Assessment: Reviewed RN Assessment (ecchymosis, skin tear buttocks)  Last BM:  8/25- type 7  Height:   Ht Readings from Last 1 Encounters:  08/24/19 5\' 4"  (1.626 m)    Weight:   Wt Readings from Last 1 Encounters:  08/31/19 73 kg    Ideal Body Weight:  59 kg  BMI:  Body mass index is 27.64 kg/m.  Estimated Nutritional Needs:   Kcal:  1700-1900kcal/day  Protein:  85-95g/day  Fluid:  1.5-1.8L/ay  Koleen Distance MS, RD, LDN Please refer to [facility] for RD and/or RD on-call/weekend/after hours pager

## 2019-09-01 ENCOUNTER — Ambulatory Visit: Admit: 2019-09-01 | Payer: Medicare Other

## 2019-09-01 DIAGNOSIS — D729 Disorder of white blood cells, unspecified: Secondary | ICD-10-CM

## 2019-09-01 LAB — CBC
HCT: 29.2 % — ABNORMAL LOW (ref 39.0–52.0)
Hemoglobin: 8.9 g/dL — ABNORMAL LOW (ref 13.0–17.0)
MCH: 28.3 pg (ref 26.0–34.0)
MCHC: 30.5 g/dL (ref 30.0–36.0)
MCV: 93 fL (ref 80.0–100.0)
Platelets: 216 10*3/uL (ref 150–400)
RBC: 3.14 MIL/uL — ABNORMAL LOW (ref 4.22–5.81)
RDW: 18.4 % — ABNORMAL HIGH (ref 11.5–15.5)
WBC: 8 10*3/uL (ref 4.0–10.5)
nRBC: 0 % (ref 0.0–0.2)

## 2019-09-01 LAB — BASIC METABOLIC PANEL
Anion gap: 8 (ref 5–15)
BUN: 18 mg/dL (ref 8–23)
CO2: 21 mmol/L — ABNORMAL LOW (ref 22–32)
Calcium: 7.7 mg/dL — ABNORMAL LOW (ref 8.9–10.3)
Chloride: 122 mmol/L — ABNORMAL HIGH (ref 98–111)
Creatinine, Ser: 1.06 mg/dL (ref 0.61–1.24)
GFR calc Af Amer: >60 mL/min (ref >60)
GFR calc non Af Amer: >60 mL/min (ref >60)
Glucose, Bld: 141 mg/dL — ABNORMAL HIGH (ref 70–99)
Potassium: 4 mmol/L (ref 3.5–5.1)
Sodium: 151 mmol/L — ABNORMAL HIGH (ref 135–145)

## 2019-09-01 LAB — METHYLMALONIC ACID, SERUM: Methylmalonic Acid, Quantitative: 226 nmol/L (ref 0–378)

## 2019-09-01 LAB — PATHOLOGIST SMEAR REVIEW

## 2019-09-01 LAB — PHOSPHORUS: Phosphorus: 3.4 mg/dL (ref 2.5–4.6)

## 2019-09-01 LAB — MAGNESIUM: Magnesium: 1.7 mg/dL (ref 1.7–2.4)

## 2019-09-01 NOTE — Progress Notes (Signed)
PROGRESS NOTE    Albert [name]  BMW:[id number] DOB: 08-24-1933 DOA: 08/24/2019 PCP: [name], MD    Brief Narrative: Albert [name] is 86 y.o. male with PMH of CKD, HTN, HLD with recent admission for GI bleed and urosepsis presents from SNF with concerns for fever and hypotensive. Patient endorses cough for 3-4 days with sputum production. Also reports 2-3 weeks of dysuria. Denies urinary urgency or frequency. Denies further melena or blood in stool. No hematemesis or hemoptysis.  Overall presentation is most consistent with acute infection/sepsis, either recurrent UTI/cystitis, versus possible pulmonary etiology including COVID-19 or bacterial pneumonia. I have a lower suspicion for his abnormal vital signs being related to GI bleed given the lack of any hematemesis, recent melena, or other specific symptoms to suggest GI etiology. He is admitted for sepsis secondary to possible aspiration pneumonia and C. difficile colitis.  He is getting antibiotics.  Given dysphagia he underwent thorough evaluation,  neurology thinks it secondary to autoimmune myopathies but there is concerned about myositis or dermatomyositis,  neurology ordered multiple labs, recommended close rheumatology  Out patient follow-up.  Patient underwent  upper esophagoscopy with upper esophageal sphincter dilatation.  Patient underwent modified barium swallow and had failed, palliative care consulted to discuss goals of care,  Patient want full scope of care.  Plan: Patient and family meeting is going to happen today 2:30 with palliative care team to discuss plan.  Assessment & Plan:   Active Problems:   Elevated troponin   Benign essential hypertension   CKD (chronic kidney disease), stage III   Sepsis (HCC)   Lactic acidosis   Anemia   Elevated brain natriuretic peptide (BNP) level   Aspiration pneumonia of right lower lobe due to gastric secretions (HCC)   Dysphagia   C. difficile colitis   Ulcer of esophagus  without bleeding   Hypomagnesemia   Hypokalemia   Advanced care planning/counseling discussion   Goals of care, counseling/discussion   Autoimmune disease ([place])   Palliative care by specialist   1. Sepsis, present on admission with hypotension.  This has improved.  Aspiration pneumonia on Unasyn (day 5 of 5), C. difficile colitis on vancomycin via NG tube now.  DC IV Flagyl.  2.  C.difficile colitis.  Vancomycin via NG tube.  3. Dysphagia with failed swallow evaluation with aspiration on esophagram and modified barium swallow.    Neurology did an NIF which was better with second time that they did it.  They ordered a MRI of the brain and cervical spine to rule out any other etiology of  trouble swallowing.   MRI : Findings may be concerning for myositis or muscular injury.  Recommended labs and close outpatient rheumatology follow-up.  ENT patient underwent rigid esophagoscopy with dilatation of the stricture.  Tolerated well..  We will have to check his swallowing again after that.  Interventional radiology can potentially do a PEG feeding tube after completion of treatment for C. difficile colitis.  Currently has NG tube. Can either resume  tube feeding or trial of swallowing after EUS dilation.   Patient failed modified barium swallow, speech recommended n.p.o. alternate ways of feeding.  Palliative consulted to discuss goals of care.  Plan:  patient needs PEG tube after completion of treatment for C. difficile.  Continue NG tube for now.  Family meeting is going to happen today to discuss final plan of care.  4. Hyperlipidemia.  Hold atorvastatin.  5. Hypomagnesemia >>> Improved.  this was replaced,  recheck electrolytes  tomorrow.  6. Hypokalemia  >>> Improved.  replace potassium as needed  and IV fluids.  7. Chronic kidney disease stage II: Renal functions back to base line.   8. Iron deficiency anemia with prior blood loss, esophageal ulcer on recent endoscopy.  Hemoglobin on the  lower side but stable.  9. Smudge cells seen on CBC.  Will need referral to hematology as outpatient for likely underlying CLL.  10. Esophageal ulcer and esophagitis on Protonix.   DVT prophylaxis: Lovenox Code Status:  Full Family Communication: No one at bed side, D/W patient. Disposition Plan: Dispo: The patient is from: Rehab  Anticipated d/c is to: Rehab  Anticipated d/c date is: Yet to be determined secondary the patient will have to have some way of getting nutrition  Patient currently being treated for aspiration pneumonia and C. difficile colitis.  Also failed swallow evaluation.              Palliative care consulted to discuss goals of care, patient wants full scope of treatment.              Plan : Family meeting is going to happen today to discuss final plan of care. Consultants:    ENT, Neurology, cardiology  Procedures:    Antimicrobials:  Anti-infectives (From admission, onward)   Start     Dose/Rate Route Frequency Ordered Stop   08/29/19 1800  vancomycin (VANCOCIN) 50 mg/mL oral solution 125 mg        125 mg Per Tube 4 times daily 08/29/19 1641 09/08/19 1759   08/28/19 1500  vancomycin (VANCOCIN) 500 mg in sodium chloride irrigation 0.9 % 100 mL ENEMA  Status:  Discontinued        500 mg Rectal Every 6 hours 08/28/19 1340 08/29/19 1641   08/28/19 1500  metroNIDAZOLE (FLAGYL) IVPB 500 mg  Status:  Discontinued        500 mg 100 mL/hr over 60 Minutes Intravenous Every 8 hours 08/28/19 1341 08/29/19 1642   08/26/19 1400  vancomycin (VANCOCIN) 50 mg/mL oral solution 125 mg  Status:  Discontinued        125 mg Oral 4 times daily 08/26/19 1255 08/28/19 1340   08/25/19 2000  vancomycin (VANCOCIN) IVPB 1000 mg/200 mL premix  Status:  Discontinued        1,000 mg 200 mL/hr over 60 Minutes Intravenous Every 24 hours 08/25/19 0240 08/25/19 1440   08/25/19 2000  doxycycline (VIBRA-TABS) tablet 100 mg  Status:  Discontinued        100  mg Oral Every 12 hours 08/25/19 1440 08/26/19 1254   08/25/19 1800  Ampicillin-Sulbactam (UNASYN) 3 g in sodium chloride 0.9 % 100 mL IVPB        3 g 200 mL/hr over 30 Minutes Intravenous Every 6 hours 08/25/19 1605 08/29/19 2336   08/25/19 1000  ceFEPIme (MAXIPIME) 2 g in sodium chloride 0.9 % 100 mL IVPB  Status:  Discontinued        2 g 200 mL/hr over 30 Minutes Intravenous Every 12 hours 08/25/19 0731 08/25/19 1548   08/25/19 0900  aztreonam (AZACTAM) 1 g in sodium chloride 0.9 % 100 mL IVPB  Status:  Discontinued        1 g 200 mL/hr over 30 Minutes Intravenous Every 8 hours 08/25/19 0228 08/25/19 0729   08/25/19 0015  aztreonam (AZACTAM) 2 g in sodium chloride 0.9 % 100 mL IVPB        2 g 200 mL/hr over 30  Minutes Intravenous  Once 08/25/19 0014 08/25/19 0115   08/25/19 0015  metroNIDAZOLE (FLAGYL) IVPB 500 mg  Status:  Discontinued        500 mg 100 mL/hr over 60 Minutes Intravenous Every 8 hours 08/25/19 0014 08/25/19 1548   08/25/19 0015  vancomycin (VANCOCIN) IVPB 1000 mg/200 mL premix        1,000 mg 200 mL/hr over 60 Minutes Intravenous  Once 08/25/19 0014 08/25/19 0222   08/24/19 2145  ceFEPIme (MAXIPIME) 2 g in sodium chloride 0.9 % 100 mL IVPB        2 g 200 mL/hr over 30 Minutes Intravenous  Once 08/24/19 2134 08/24/19 2222   08/24/19 2145  vancomycin (VANCOCIN) IVPB 1000 mg/200 mL premix        1,000 mg 200 mL/hr over 60 Minutes Intravenous  Once 08/24/19 2134 08/24/19 2251     Subjective: Patient was seen and examined at bedside, Overnight events noted.  Patient failed modified barium swallow, Patient has NG tube, goals of care discussion completed, Patient want full scope of treatment.  Family meeting is going to happen at 66 today. Objective: Vitals:   09/01/19 0400 09/01/19 0831 09/01/19 1215 09/01/19 1449  BP: (!) 110/54 117/60 (!) 99/55 122/81  Pulse: 63 75 (!) 58 74  Resp: 14 16 16 17   Temp: (!) 97.5 F (36.4 C) 97.6 F (36.4 C) 97.8 F (36.6 C) 97.6 F  (36.4 C)  TempSrc: Oral Oral Oral Oral  SpO2: 99% 98% 99% 97%  Weight: 74.3 kg     Height:        Intake/Output Summary (Last 24 hours) at 09/01/2019 1539 Last data filed at 09/01/2019 1500 Gross per 24 hour  Intake 3601.7 ml  Output 300 ml  Net 3301.7 ml   Filed Weights   08/29/19 0334 08/31/19 0504 09/01/19 0400  Weight: 69.2 kg 73 kg 74.3 kg    Examination:  General exam: Appears calm and comfortable , NG tube in Rt nostril Respiratory system: Clear to auscultation. Respiratory effort normal. Cardiovascular system: S1 & S2 heard, RRR. No JVD, murmurs, rubs, gallops or clicks. No pedal edema. Gastrointestinal system: Abdomen is nondistended, soft and nontender. No organomegaly or masses felt. Normal bowel sounds heard. Central nervous system: Alert and oriented. No focal neurological deficits. Extremities: No leg edema, no cyanosis. Skin: No rashes, lesions or ulcers Psychiatry: Judgement and insight appear normal. Mood & affect appropriate.     Data Reviewed: I have personally reviewed following labs and imaging studies  CBC: Recent Labs  Lab 08/26/19 0539 08/27/19 0557 08/28/19 0416 08/31/19 0604 09/01/19 0526  WBC 9.8 10.0 9.5 7.8 8.0  HGB 8.2* 8.4* 8.3* 9.4* 8.9*  HCT 26.1* 26.4* 27.0* 29.4* 29.2*  MCV 91.6 91.3 91.8 90.2 93.0  PLT 176 193 214 227 408   Basic Metabolic Panel: Recent Labs  Lab 08/26/19 1423 08/27/19 0557 08/28/19 0416 08/30/19 0607 08/30/19 1403 08/31/19 0604 09/01/19 0526  NA 141   < > 146* 152* 152* 151* 151*  K 3.8   < > 3.3* 3.4* 3.7 3.8 4.0  CL 111   < > 118* 122* 122* 122* 122*  CO2 20*   < > 19* 19* 22 19* 21*  GLUCOSE 83   < > 83 96 112* 86 141*  BUN 26*   < > 21 17 17 19 18   CREATININE 1.16   < > 1.01 1.00 1.00 1.14 1.06  CALCIUM 7.4*   < > 7.1* 7.7* 7.3* 7.6*  7.7*  MG 1.4*  --  1.6*  --  1.5* 1.8 1.7  PHOS  --   --   --   --   --  3.7 3.4   < > = values in this interval not displayed.   GFR: Estimated Creatinine  Clearance: 47.8 mL/min (by C-G formula based on SCr of 1.06 mg/dL). Liver Function Tests: Recent Labs  Lab 08/30/19 1403  AST 36  ALT 14  ALKPHOS 58  BILITOT 0.8  PROT 5.0*  ALBUMIN 1.8*   No results for input(s): LIPASE, AMYLASE in the last 168 hours. No results for input(s): AMMONIA in the last 168 hours. Coagulation Profile: No results for input(s): INR, PROTIME in the last 168 hours. Cardiac Enzymes: Recent Labs  Lab 08/29/19 1338  CKTOTAL 45*   BNP (last 3 results) No results for input(s): PROBNP in the last 8760 hours. HbA1C: No results for input(s): HGBA1C in the last 72 hours. CBG: Recent Labs  Lab 08/26/19 0057 08/26/19 0147 08/26/19 0610  GLUCAP 77 88 69*   Lipid Profile: No results for input(s): CHOL, HDL, LDLCALC, TRIG, CHOLHDL, LDLDIRECT in the last 72 hours. Thyroid Function Tests: No results for input(s): TSH, T4TOTAL, FREET4, T3FREE, THYROIDAB in the last 72 hours. Anemia Panel: No results for input(s): VITAMINB12, FOLATE, FERRITIN, TIBC, IRON, RETICCTPCT in the last 72 hours. Sepsis Labs: No results for input(s): PROCALCITON, LATICACIDVEN in the last 168 hours.  Recent Results (from the past 240 hour(s))  Urine culture     Status: None   Collection Time: 08/24/19  8:37 PM   Specimen: In/Out Cath Urine  Result Value Ref Range Status   Specimen Description   Final    IN/OUT CATH URINE Performed at [facility], [address], [place], [place] [address]    Special Requests   Final    NONE Performed at [facility], [address], [place], [place] [address]    Culture   Final    NO GROWTH Performed at [facility], [place] [address] [place],  [address]    Report Status 08/26/2019 FINAL  Final  SARS Coronavirus 2 by RT PCR (hospital order, performed in [facility] hospital lab) Nasopharyngeal Nasopharyngeal Swab     Status: None   Collection Time: 08/24/19  8:53 PM   Specimen: Nasopharyngeal Swab  Result  Value Ref Range Status   SARS Coronavirus 2 NEGATIVE NEGATIVE Final    Comment: (NOTE) SARS-CoV-2 target nucleic acids are NOT DETECTED.  The SARS-CoV-2 RNA is generally detectable in upper and lower respiratory specimens during the acute phase of infection. The lowest concentration of SARS-CoV-2 viral copies this assay can detect is 250 copies / mL. A negative result does not preclude SARS-CoV-2 infection and should not be used as the sole basis for treatment or other patient management decisions.  A negative result may occur with improper specimen collection / handling, submission of specimen other than nasopharyngeal swab, presence of viral mutation(s) within the areas targeted by this assay, and inadequate number of viral copies (<250 copies / mL). A negative result must be combined with clinical observations, patient history, and epidemiological information.  Fact Sheet for Patients:   StrictlyIdeas.no  Fact Sheet for Healthcare Providers: BankingDealers.co.za  This test is not yet approved or  cleared by the [place] FDA and has been authorized for detection and/or diagnosis of SARS-CoV-2 by FDA under an Emergency Use Authorization (EUA).  This EUA will remain in effect (meaning this test  can be used) for the duration of the COVID-19 declaration under Section 564(b)(1) of the Act, 21 U.S.C. section 360bbb-3(b)(1), unless the authorization is terminated or revoked sooner.  Performed at [facility], [address] [place], [place] [address]   Blood Culture (routine x 2)     Status: None   Collection Time: 08/24/19  9:00 PM   Specimen: BLOOD  Result Value Ref Range Status   Specimen Description BLOOD BLOOD LEFT FOREARM  Final   Special Requests   Final    BOTTLES DRAWN AEROBIC AND ANAEROBIC Blood Culture results may not be optimal due to an excessive volume of blood received in culture bottles   Culture   Final     NO GROWTH 5 DAYS Performed at [facility], [address] [place], [place] [address]    Report Status 08/29/2019 FINAL  Final  Blood Culture (routine x 2)     Status: None   Collection Time: 08/24/19  9:05 PM   Specimen: BLOOD  Result Value Ref Range Status   Specimen Description BLOOD BLOOD LEFT HAND  Final   Special Requests   Final    BOTTLES DRAWN AEROBIC AND ANAEROBIC Blood Culture adequate volume   Culture   Final    NO GROWTH 5 DAYS Performed at [facility], [address] [place], [place] [address]    Report Status 08/29/2019 FINAL  Final  MRSA PCR Screening     Status: None   Collection Time: 08/25/19 10:33 PM   Specimen: Nasopharyngeal  Result Value Ref Range Status   MRSA by PCR NEGATIVE NEGATIVE Final    Comment:        The GeneXpert MRSA Assay (FDA approved for NASAL specimens only), is one component of a comprehensive MRSA colonization surveillance program. It is not intended to diagnose MRSA infection nor to guide or monitor treatment for MRSA infections. Performed at [facility], [place], [place] [address]   C Difficile Quick Screen w PCR reflex     Status: Abnormal   Collection Time: 08/26/19 10:22 AM   Specimen: STOOL  Result Value Ref Range Status   C Diff antigen POSITIVE (A) NEGATIVE Final   C Diff toxin POSITIVE (A) NEGATIVE Final   C Diff interpretation Toxin producing C. difficile detected.  Final    Comment: CRITICAL RESULT CALLED TO, READ BACK BY AND VERIFIED WITH: ANNIE RITCHIE AT 1128 08/26/19.PMF Performed at [facility], [address] [place], [place] [address]          Radiology Studies: MR ABDOMEN W WO CONTRAST  Result Date: 08/31/2019 CLINICAL DATA:  Evaluate kidney cyst. EXAM: MRI ABDOMEN WITHOUT AND WITH CONTRAST TECHNIQUE: Multiplanar multisequence MR imaging of the abdomen was performed both before and after the administration of intravenous contrast. CONTRAST:   83mL GADAVIST GADOBUTROL 1 MMOL/ML IV SOLN COMPARISON:  MRI scratch set CT 08/09/2019 FINDINGS: Lower chest: Moderate bilateral pleural effusions with overlying compressive type atelectasis. Hepatobiliary: No focal liver abnormality identified. Tiny stones and/or sludge noted layering within the dependent portion of the gallbladder. No gallbladder wall thickening. Pericholecystic fluid noted. No intrahepatic bile duct dilatation. Normal caliber common bile duct. Pancreas: No mass, inflammatory changes, or other parenchymal abnormality identified. Spleen:  Within normal limits in size and appearance. Adrenals/Urinary Tract: Normal appearance of the adrenal glands. 9 mm calcified lesion arising from the medial cortex of the inferior pole of the right kidney is identified, image 27/11. Unchanged. No definite solid  enhancing component noted. Several subcentimeter cystic lesions are noted within the left kidney. The largest is in the left mid parapelvic region measuring 0.7 cm. These are technically too small to reliably characterize. No hydronephrosis identified bilaterally. Stomach/Bowel: Small hiatal hernia. Stomach is nondistended. No dilated loops of bowel identified. Numerous colonic diverticula noted. Vascular/Lymphatic: Extensive aortic atherosclerosis without aneurysm. The portal vein and splenic vein appear patent. No upper abdominal adenopathy. Other: Small to moderate volume of ascites is identified and appears increased from previous exam. There is mesenteric edema as well as diffuse body wall edema compatible with anasarca. Musculoskeletal: No suspicious bone lesions identified. IMPRESSION: 1. Stable appearance of calcified lesion arising from the medial cortex of the inferior pole of the right kidney. No definite solid enhancing component noted. Finding is favored to represent a benign process. Consider follow-up imaging in 12 months with repeat CT or MRI without and with contrast material. 2. Small to  moderate volume of ascites, mesenteric edema and diffuse body wall edema compatible with anasarca. 3. Moderate bilateral pleural effusions with overlying compressive type atelectasis. 4. Small hiatal hernia. 5. Sludge and/or stones identified layering within the gallbladder. No signs of gallbladder wall inflammation or biliary ductal dilatation. 6.  Aortic Atherosclerosis (ICD10-I70.0). Electronically Signed   By: Kerby Moors M.D.   On: 08/31/2019 05:16   DG Loyce Dys Tube Plc W/Fl W/Rad  Result Date: 08/31/2019 CLINICAL DATA:  Dobbhoff tube placement. EXAM: NASO G TUBE PLACEMENT WITH FL AND WITH RAD CONTRAST:  None. FLUOROSCOPY TIME:  Fluoroscopy Time:  5 minutes 12 seconds Radiation Exposure Index (if provided by the fluoroscopic device): 98.2 mGy COMPARISON:  Prior study 08/29/2019. FINDINGS: Dobbhoff tube was advanced in usual fashion down the esophagus/stomach and into the proximal duodenum under fluoroscopic guidance. There were no complications. Dobbhoff tube secured and patient sent back to his room in stable condition. IMPRESSION: Successful Dobbhoff tube placement under fluoroscopic guidance. Tube tip positioned in the proximal duodenum. Electronically Signed   By: Sigurd   On: 08/31/2019 10:14   DG Swallowing Func-Speech Pathology  Result Date: 08/31/2019 Objective Swallowing Evaluation: Type of Study: MBS-Modified Barium Swallow Study  Patient Details Name: KEYLOR RANDS MRN: [id number] Date of Birth: 1933/01/27 Today's Date: 08/31/2019 Time: SLP Start Time (ACUTE ONLY): 0900 -SLP Stop Time (ACUTE ONLY): 0924 SLP Time Calculation (min) (ACUTE ONLY): 24 min Past Medical History: Past Medical History: Diagnosis Date . Actinic keratosis 04/23/2008  L elbow distal tricep (bx proven) . Basal cell carcinoma 06/17/2006  L preauricular  . Basal cell carcinoma 05/26/2012  L ant neck  . Basal cell carcinoma 11/02/2012  Glabella - excision  . Basal cell carcinoma 08/15/2014  L med ankle  . Basal  cell carcinoma 02/23/2017  R lat infrapectoral - excision . Basal cell carcinoma 12/08/2017  L mid to distal lat volar forearm  . Hypertension  . Squamous cell carcinoma of skin 02/14/2014  R lat low back - ED&C . Squamous cell carcinoma of skin 10/02/2014  R prox lat bicep - ED&C Past Surgical History: Past Surgical History: Procedure Laterality Date . COLONOSCOPY N/A 08/05/2019  Procedure: COLONOSCOPY;  Surgeon: Toledo, Benay Pike, MD;  Location: [facility] END[facility];  Service: Gastroenterology;  Laterality: N/A; . ESOPHAGEAL DILATION N/A 08/30/2019  Procedure: ESOPHAGEAL DILATION;  Surgeon: Margaretha Sheffield, MD;  Location: [facility] ORS;  Service: ENT;  Laterality: N/A; . ESOPHAGOGASTRODUODENOSCOPY N/A 08/05/2019  Procedure: ESOPHAGOGASTRODUODENOSCOPY (EGD);  Surgeon: Toledo, Benay Pike, MD;  Location: [facility] END[facility];  Service: Gastroenterology;  Laterality:  N/A; . ESOPHAGOSCOPY N/A 08/30/2019  Procedure: ESOPHAGOSCOPY;  Surgeon: Margaretha Sheffield, MD;  Location: [facility] ORS;  Service: ENT;  Laterality: N/A; HPI: Albert [name] is a 86 y.o. male with PMH as noted below presents from his facility with fever and low blood pressure today.  Previously he had been admitted for upper GI bleed, EGD performed and pt diagnosed with esophageal and duodenal ulcers at the end of last month.  Chest x-ray indicates chronic appearing increased lung markings with mild right basilar infiltrate. ST consulted as pt reports difficulty swallowing.  Subjective: pt pleasant, conversant, fair historian Assessment / Plan / Recommendation CHL IP CLINICAL IMPRESSIONS 08/31/2019 Clinical Impression Pt continues to present with severe pharyngoesophageal dysphagia that has worsened since previous MBSS on 08/28/2019. As such he is at an [place] (even with his own salvia). Of note, pt's UES was dilated 08/30/2019. During this study, pt's UES opened less than previous study. Therefore more of EACH BOLUS was SILENTLY ASPIRATED with almost all of the  puree bolus was aspirated. Massive amounts of residue remained in throughout the pharynx. Pt was not able to clear with increased swallows. During the repeat swallows, he actually aspirated the residue during the attempt to swallow. Compensatory swallow strategies were attempted but none were effective. Pt attempted to "hock" up pharyngeal residue at the end. He was able to expell some of the bolus but not enough to clear his pharynx. Essentially pt has frank silent aspiration before, during and after the swallow across thin liquids, nectar thick liquids and puree. At this time, it appears that pt's decrease hyolaryngeal excursion and opening of the UES is likely neuromuscular in nature. Recommend long-term nutritional support. Recommend STRICT ORAL CARE to REDUCE bacterial load present in pt's salvia.  SLP Visit Diagnosis Dysphagia, pharyngoesophageal phase (R13.14) Attention and concentration deficit following -- Frontal lobe and executive function deficit following -- Impact on safety and function Severe aspiration risk;Risk for inadequate nutrition/hydration   CHL IP TREATMENT RECOMMENDATION 08/31/2019 Treatment Recommendations No treatment recommended at this time   Prognosis 08/31/2019 Prognosis for Safe Diet Advancement Guarded Barriers to Reach Goals Severity of deficits Barriers/Prognosis Comment -- CHL IP DIET RECOMMENDATION 08/31/2019 SLP Diet Recommendations NPO Liquid Administration via -- Medication Administration Via alternative means Compensations -- Postural Changes --   CHL IP OTHER RECOMMENDATIONS 08/31/2019 Recommended Consults -- Oral Care Recommendations Oral care QID Other Recommendations Remove water pitcher;Prohibited food (jello, ice cream, thin soups);Have oral suction available   CHL IP FOLLOW UP RECOMMENDATIONS 08/31/2019 Follow up Recommendations Skilled Nursing facility   [facility] IP FREQUENCY AND DURATION 08/28/2019 Speech Therapy Frequency (ACUTE ONLY) min 2x/week Treatment Duration 2 weeks       CHL IP ORAL PHASE 08/31/2019 Oral Phase Impaired Oral - Pudding Teaspoon -- Oral - Pudding Cup -- Oral - Honey Teaspoon -- Oral - Honey Cup -- Oral - Nectar Teaspoon Weak lingual manipulation;Lingual pumping;Reduced posterior propulsion;Piecemeal swallowing;Delayed oral transit;Decreased bolus cohesion;Premature spillage Oral - Nectar Cup -- Oral - Nectar Straw Weak lingual manipulation;Lingual pumping;Reduced posterior propulsion;Piecemeal swallowing;Decreased bolus cohesion;Delayed oral transit;Premature spillage Oral - Thin Teaspoon Weak lingual manipulation;Lingual pumping;Reduced posterior propulsion;Piecemeal swallowing;Delayed oral transit;Decreased bolus cohesion;Premature spillage Oral - Thin Cup -- Oral - Thin Straw Weak lingual manipulation;Lingual pumping;Reduced posterior propulsion;Piecemeal swallowing;Delayed oral transit;Decreased bolus cohesion;Premature spillage Oral - Puree Weak lingual manipulation;Lingual pumping;Reduced posterior propulsion;Piecemeal swallowing;Delayed oral transit;Decreased bolus cohesion;Premature spillage Oral - Mech Soft -- Oral - Regular -- Oral - Multi-Consistency -- Oral - Pill -- Oral Phase -  Comment more impaired over previous MBSS on 08/28/2019  CHL IP PHARYNGEAL PHASE 08/31/2019 Pharyngeal Phase Impaired Pharyngeal- Pudding Teaspoon -- Pharyngeal -- Pharyngeal- Pudding Cup -- Pharyngeal -- Pharyngeal- Honey Teaspoon -- Pharyngeal -- Pharyngeal- Honey Cup -- Pharyngeal -- Pharyngeal- Nectar Teaspoon Delayed swallow initiation-vallecula;Delayed swallow initiation-pyriform sinuses;Reduced epiglottic inversion;Reduced anterior laryngeal mobility;Reduced laryngeal elevation;Reduced airway/laryngeal closure;Penetration/Aspiration before swallow;Penetration/Aspiration during swallow;Penetration/Apiration after swallow;Significant aspiration (Amount);Pharyngeal residue - valleculae;Pharyngeal residue - pyriform;Pharyngeal residue - posterior pharnyx;Pharyngeal residue - cp  segment;Reduced pharyngeal peristalsis Pharyngeal Material enters airway, passes BELOW cords without attempt by patient to eject out (silent aspiration) Pharyngeal- Nectar Cup -- Pharyngeal -- Pharyngeal- Nectar Straw Delayed swallow initiation-vallecula;Delayed swallow initiation-pyriform sinuses;Reduced pharyngeal peristalsis;Reduced epiglottic inversion;Reduced anterior laryngeal mobility;Reduced laryngeal elevation;Reduced airway/laryngeal closure;Penetration/Aspiration before swallow;Penetration/Aspiration during swallow;Penetration/Apiration after swallow;Significant aspiration (Amount);Pharyngeal residue - valleculae;Pharyngeal residue - pyriform;Pharyngeal residue - posterior pharnyx;Pharyngeal residue - cp segment Pharyngeal Material enters airway, passes BELOW cords without attempt by patient to eject out (silent aspiration) Pharyngeal- Thin Teaspoon Delayed swallow initiation-vallecula;Delayed swallow initiation-pyriform sinuses;Reduced pharyngeal peristalsis;Reduced epiglottic inversion;Reduced anterior laryngeal mobility;Reduced laryngeal elevation;Reduced airway/laryngeal closure;Penetration/Aspiration before swallow;Penetration/Aspiration during swallow;Penetration/Apiration after swallow;Significant aspiration (Amount);Pharyngeal residue - valleculae;Pharyngeal residue - pyriform;Pharyngeal residue - posterior pharnyx;Pharyngeal residue - cp segment Pharyngeal Material enters airway, passes BELOW cords without attempt by patient to eject out (silent aspiration) Pharyngeal- Thin Cup NT Pharyngeal -- Pharyngeal- Thin Straw NT Pharyngeal -- Pharyngeal- Puree Delayed swallow initiation-vallecula;Delayed swallow initiation-pyriform sinuses;Reduced pharyngeal peristalsis;Reduced epiglottic inversion;Reduced anterior laryngeal mobility;Reduced laryngeal elevation;Reduced airway/laryngeal closure;Penetration/Aspiration before swallow;Penetration/Aspiration during swallow;Penetration/Apiration after  swallow;Significant aspiration (Amount);Pharyngeal residue - valleculae;Pharyngeal residue - pyriform;Pharyngeal residue - posterior pharnyx;Pharyngeal residue - cp segment Pharyngeal Material enters airway, passes BELOW cords without attempt by patient to eject out (silent aspiration) Pharyngeal- Mechanical Soft -- Pharyngeal -- Pharyngeal- Regular -- Pharyngeal -- Pharyngeal- Multi-consistency -- Pharyngeal -- Pharyngeal- Pill -- Pharyngeal -- Pharyngeal Comment --  CHL IP CERVICAL ESOPHAGEAL PHASE 08/31/2019 Cervical Esophageal Phase Impaired Pudding Teaspoon -- Pudding Cup -- Honey Teaspoon -- Honey Cup -- Nectar Teaspoon Reduced cricopharyngeal relaxation Nectar Cup -- Nectar Straw Reduced cricopharyngeal relaxation Thin Teaspoon Reduced cricopharyngeal relaxation Thin Cup NT Thin Straw NT Puree Reduced cricopharyngeal relaxation Mechanical Soft -- Regular -- Multi-consistency -- Pill -- Cervical Esophageal Comment less UES opening for bolus than previous study on 08/28/2019 Happi B. Rutherford Nail M.S., CCC-SLP, [place] Office [phone] Stormy Fabian 08/31/2019, 12:47 PM               Scheduled Meds: . enoxaparin (LOVENOX) injection  40 mg Subcutaneous Q24H  . feeding supplement (PROSource TF)  45 mL Per Tube Daily  . free water  100 mL Per Tube Q4H  . [START ON 09/03/2019] iohexol  500 mL Per Tube Q1H  . metoprolol tartrate  2.5 mg Intravenous Q12H  . pantoprazole (PROTONIX) IV  40 mg Intravenous Q12H  . vancomycin  125 mg Per Tube QID   Continuous Infusions: . sodium chloride 250 mL (08/29/19 0621)  . dextrose 5 % and 0.45 % NaCl with KCl 20 mEq/L 50 mL/hr at 09/01/19 0838  . feeding supplement (OSMOLITE 1.5 CAL) 1,000 mL (08/31/19 1700)     LOS: 7 days    Time spent: 25 mins.    Shawna Clamp, MD [facility]   If 7PM-7AM, please contact night-coverage

## 2019-09-01 NOTE — Progress Notes (Signed)
OT Cancellation Note  Patient Details Name: Albert [name] MRN: [id number] DOB: 1933/05/13   Cancelled Treatment:    Reason Eval/Treat Not Completed: Other (comment)  Palliative to assist in establishing goals of care with pt and family. Meeting to take place this date. Will f/u for OT treatment if appropriate, following better established goals of care. Thank you.  Gerrianne Scale, [place], OTR/L ascom [phone] 09/01/19, 3:08 PM

## 2019-09-01 NOTE — Care Management Important Message (Signed)
Important Message  Patient Details  Name: Albert [name] MRN: [id number] Date of Birth: October 07, 1933   Medicare Important Message Given:  Yes  Reviewed with patient's family over room phone due to isolation status.  Met relatives outside of room to give copy of Medicare IM.     Dannette Barbara 09/01/2019, 3:30 PM

## 2019-09-01 NOTE — Progress Notes (Addendum)
Neurology Progress Note  Patient ID: Albert [name] is a 86 y.o. male with medical history significant forhypertension, hyperlipidemia,  osteopenia, chronic kidney disease stage IIIa, chronic pain secondary to arthritis, recent GI bleeds, atrial arrhthymias (PACs and PVCs per cardiology, no need for [facility]); neurology was consulted on 8/24 for concern for a neurological cause of his dysphagia  Major interval events/Subjective:  - Completed goals of care discussion after patient was indecisive about PEG tube placement last night; family and patient are on board to pursue potential treatment with the understanding that G-tube placement can be reversible should he have a treatable process. They also understand he may or may not have a treatable process.   Exam: Vitals:   09/01/19 1215 09/01/19 1449  BP: (!) 99/55 122/81  Pulse: (!) 58 74  Resp: 16 17  Temp: 97.8 F (36.6 C) 97.6 F (36.4 C)  SpO2: 99% 97%   Constitutional: Chronically ill-appearing,  Psych: Affect appropriate to situation, pleasant and cooperative, intermittently appropriately frustrated but redirectable  Eyes: No scleral injection HENT: temporal wasting, NG tube in place MSK: arthritic changes most notable in the hands, with severe atrophy of bilateral hands Cardiovascular: Irregularly irregular Respiratory: Effort normal, non-labored breathing  Neuro: Mental Status: Patient is awake, alert, oriented to person, place, month, year, and situation. Cranial Nerves: II: Pupils are equal, round, and reactive to light. VII: Facial movement is symmetric.  VIII: hearing is baseline hard of hearing, worse on the right Motor: There is diffuse muscle wasting throughout, including temporal wasting and severe atrophy of the hands. No fasciculations or tremor today.    Stable strength exam: 4/5 head flexion, 3/5 bilateral deltoids, 4/5 elbow flexion, 4/5 elbow extension, 4/5 finger extension, 5/5 finger flexion, 3/5 right hip  flexion, 2/5 left hip flexion, 4/5 knee extension bilaterally, 4/5 knee flexion bilaterally, 4/5 foot dorsi flexion, 5/5 foot plantar flexion Reflexes:  Brisk 3+ bilateral biceps, brachioradialis, patellars, no ankle clonus   Pertinent Labs and diagnostics: Cr uptrending today today at 1.19  Stable hypernatremia at 151 (146 on 8/23, 143 on 8/22, 152 on 8/25) CK was 6 in 11/2018, then 221 08/03/2019, 45 on 08/29/2019 Aldolase 9.6 Anti-SM neg, SSB neg, SSA > 8.0, ANA Positive  ESR elevated to 65 HIV resulted negative LDH 200 (ref range 98-192) CRP 2.6 (ref range < 1.0)  MRI Abdomen 8/25, personally reviewed, agree with radiology read IMPRESSION: 1. Stable appearance of calcified lesion arising from the medial cortex of the inferior pole of the right kidney. No definite solid enhancing component noted. Finding is favored to represent a benign process. Consider follow-up imaging in 12 months with repeat CT or MRI without and with contrast material. 2. Small to moderate volume of ascites, mesenteric edema and diffuse body wall edema compatible with anasarca. 3. Moderate bilateral pleural effusions with overlying compressive type atelectasis. 4. Small hiatal hernia. 5. Sludge and/or stones identified layering within the gallbladder. No signs of gallbladder wall inflammation or biliary ductal dilatation. 6.  Aortic Atherosclerosis (ICD10-I70.0).  MRI brain and C-spine personally reviewed 8/24    Agree with radiology impression: MRI HEAD IMPRESSION: 1. No acute intracranial abnormality or structural findings to explain patient's symptoms identified. 2. Prominent bilateral mastoid and middle ear effusions, of uncertain clinical significance. Correlation with physical exam and any potential symptomatology recommended. 3. 5 mm T1 hyperintense lesion within the pituitary gland, indeterminate. No further imaging evaluation or follow-up is necessary. Consider endocrine function tests and  correlate for history of pituitary hypersecretion. This  follows ACR consensus guidelines: Management of Incidental Pituitary Findings on CT, MRI and F18-FDG PET: A White Paper of the ACR Incidental Findings Committee. J Am Coll Radiol 2018; 15: 035-46.  MRI CERVICAL SPINE IMPRESSION: 1. Diffuse edema and enhancement involving the left greater than right upper posterior paraspinous musculature. Finding is indeterminate, and could reflect sequelae of muscular injury/strain. Acute myositis could also be considered, which could be either infectious or inflammatory in nature. No loculated collections. 2. No other acute abnormality within the cervical spine. No cord signal changes to suggest myelopathy. 3. Multilevel cervical spondylosis with resultant mild to moderate diffuse spinal stenosis at C3-4 through C6-7, most pronounced at C5-6. Associated moderate to severe bilateral foraminal narrowing at C3 through C7 as above.  8/4 CT angio chest abdomen pelvis, which did reveal a renal cyst for which renal protocol MRI at 6 months, 12 months and then yearly was recommended;  11/22/2018 CTA chest also personally reviewed and do not see clear evidence of malignancy, most likely changes due to Covid pneumonia he was diagnosed with at the time.   Impression: This is an 86 year old gentleman who is previously fairly healthy and has had a complicated course recently with multiple hospitalizations, GI bleed, C. Diff infection and aspiration pneumonia.   His general examination is notable for diffuse atrophy, lower extremity edema, and a resolving rash on his trunk and arms with some possible nailbed changes on his toes.  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  Additionally he the aforementioned muscular atrophy as well as a pattern of proximal greater than distal weakness suggestive of a myopathy or myositis.   He does not have features suggestive of myasthenia gravis such as fatigability, diplopia.  He does have some features suggestive of an autoimmune process such as positive ANA and SSB.  There is always a possibility of an underlying paraneoplastic process, for which and imaging to date (CT chest November 2020, abdomen pelvis August 4, renal MRI 8/26) did not show clear malignancy.  However there is concern for possible smudge cells on his CBC with concern for CLL, which can be associated with paraneoplastic neurological processes.   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 I would like to proceed with IVIG and steroid treatment, but given the diagnostic workup for hematological malignancy can be muddied by prior steroid treatment, appreciate oncology involvement and recommendations.    Recommendations:  #Generalized weakness, including dysphagia - Please hold statin pending further workup (HMG-CoA Reductase antibody result) >Pending labs/tests below have been ordered by myself -anti-HMGCR Ab ([facility] send out 2-3 weeks return time),  -myositis panel ([facility] send out 2-3 weeks return time) -muscle biopsy to be pursued here next week; glutaraldehyde ordered from [facility] -potential for IVIG and steroids after further discussion with heme-onc >Additional support on an outpatient basis - Rheumatology evaluation - EMG/NCS   #Pituitary incidentaloma -Work-up per primary team, consider PCP or endocrine follow-up  # Hypernatremia - Stable, possibly secondary to poor intake while NPO, appreciate primary team adjustment of tube feeds as needed  # Infections - s/p ampicillin and metronidazole 8/19 - 8/24 (initially got cefepime and IV vanc as well as aztreonam) for aspiration PNA - continues on oral vancomycin 8/21 - present for C.  Diff   # Possible CLL or other malignancy  - appreciate  heme-onc involvement  Lesleigh Noe MD-PhD [facility] [phone]   [facility] coverage for [facility] is from 7 AM to 8 AM by telephone only (no video visit capability at this time), 8 AM to 4 PM in-house, and 4 PM to 8 PM again by telephone only. Please do not hesitate to reach out to me with any questions.

## 2019-09-01 NOTE — Progress Notes (Signed)
Oncologist in room at bs discussing care with patient at this time.

## 2019-09-01 NOTE — Progress Notes (Signed)
[facility]  Date of admission:  08/24/2019  Inpatient day:  09/01/2019  Consulting physician: Dr Shawna Clamp.  Reason for Consultation:  Smudge cells.  Chief Complaint: Albert [name] is a 86 y.o. male with chronic kidney disease, and hypertension who was admitted through the emergency room with fever and hypotension.  HPI: The patient was admitted to [facility] from 08/03/2019 - 08/08/2019 with generalized weakness and GI bleeding.  He denies any blood in his stool although EMS found him in a pool of maroon blood.  Creatinine was 2.63 CBC noted chronic leukocytosis and anemia with a hemoglobin of 6 (previously 12.1 in 04/2019).  INR was 2.2.  He received IV fluids, vitamin K, Protonix and 2 units of PRBCs. Echo revealed normal LV function with mild to moderate aortic stenosis.    Colonoscopy on 08/05/2019 by Dr. Alice Reichert revealed a poor colon prep.  There was diverticulosis in the sigmoid colon.  There was blood and stool in the rectum and in the sigmoid colon.   EGD revealed an esophageal ulcer with no stigmata of recent bleeding.  There was LA Grade A reflux esophagitis with no bleeding and a 2 cm hiatal hernia.  There was erythematous mucosa in the antrum and non-bleeding duodenal ulcers with no stigmata of bleeding.  The patient was admitted to [facility] from 08/09/2019 - 08/14/2019 with weakness and melena.  WBC was 28,000.  He was treated with for cystitis with antibiotics and placed on octreotide and Protonix and transfused 1 unit of PRBCs for GI bleed.  Previously, he had been admitted for an upper GI bleed and diagnosed with esophageal and duodenal ulcers.  Patient presented to the ER on 08/24/2019 with fever and low pressure blood pressure from a skilled nursing facility.  He noted cough for 3-4 days with sputum production and 2 - 3 weeks of dysuria.  He was admitted with possible aspiration pneumonia and C. difficile colitis.  He was placed on Unasyn and Flagyl with  transitioned to oral vancomycin.  He was noted to have dysphagia is undergoing evaluation.  He failed the swallowing evaluation.  He currently has a Dubbhoff tube in place.  Hematology consultation wasrecommended regarding smudge cells seen on CBC.  CBC today reveals a hematocrit of 29.2, hemoglobin 8.9, MCV 93.0, platelets 216,000, WBC 8000.  He has had chronic anemia since 11/2018.  WBC has ranged between 5,000 - 25,000 without trend.  Differential has been predominantly neutrophils.  Platelet count has ranged between 99,000 - 239,000 without trend  Ferritin was 176 and iron saturation of 9% and a TIBC of 116 on 08/25/2019.  B12 was 300 on 08/26/2019.  Peripheral smear revealed normocytic anemia.  Morphology of RBCs, WBCs and platelets were within normal limits.  There were no evidence of circulating blasts, schistocytes or smudge cells.  Symptomatically, he feels weak.  He was in rehab prior to admission working on getting stronger and walking independently.  He denies any issues with recurrent infection.  He denies any fevers, sweats or weight loss.  He denies early satiety.  He states that before admission he was eating by mouth.  He states that he always has had trouble swallowing and has to chew his food well.   Past Medical History:  Diagnosis Date  . Actinic keratosis 04/23/2008   L elbow distal tricep (bx proven)  . Basal cell carcinoma 06/17/2006   L preauricular   . Basal cell carcinoma 05/26/2012   L ant neck   . Basal  cell carcinoma 11/02/2012   Glabella - excision   . Basal cell carcinoma 08/15/2014   L med ankle   . Basal cell carcinoma 02/23/2017   R lat infrapectoral - excision  . Basal cell carcinoma 12/08/2017   L mid to distal lat volar forearm   . Hypertension   . Squamous cell carcinoma of skin 02/14/2014   R lat low back - ED&C  . Squamous cell carcinoma of skin 10/02/2014   R prox lat bicep - ED&C    Past Surgical History:  Procedure Laterality Date  .  COLONOSCOPY N/A 08/05/2019   Procedure: COLONOSCOPY;  Surgeon: Toledo, Benay Pike, MD;  Location: [facility] ENDOSCOPY;  Service: Gastroenterology;  Laterality: N/A;  . ESOPHAGEAL DILATION N/A 08/30/2019   Procedure: ESOPHAGEAL DILATION;  Surgeon: Margaretha Sheffield, MD;  Location: [facility] ORS;  Service: ENT;  Laterality: N/A;  . ESOPHAGOGASTRODUODENOSCOPY N/A 08/05/2019   Procedure: ESOPHAGOGASTRODUODENOSCOPY (EGD);  Surgeon: Toledo, Benay Pike, MD;  Location: [facility] ENDOSCOPY;  Service: Gastroenterology;  Laterality: N/A;  . ESOPHAGOSCOPY N/A 08/30/2019   Procedure: ESOPHAGOSCOPY;  Surgeon: Margaretha Sheffield, MD;  Location: [facility] ORS;  Service: ENT;  Laterality: N/A;    History reviewed. No pertinent family history.   His mother had liver cancer.  Social History:  reports that he has never smoked. He has never used smokeless tobacco. He reports previous alcohol use. He reports that he does not use drugs.  The patient denies any exposure to radiation or toxins.  The patient lives in [place].  Prior to admission he was in a rehab facility.  He is alone today.  Allergies: No Active Allergies  Medications Prior to Admission  Medication Sig Dispense Refill  . aspirin EC 81 MG tablet Take 1 tablet (81 mg total) by mouth daily. Swallow whole.start 08/19/19 150 tablet 2  . atorvastatin (LIPITOR) 20 MG tablet Take 20 mg by mouth daily.    . hydrochlorothiazide (HYDRODIURIL) 25 MG tablet Take 25 mg by mouth daily.    . Multiple Vitamin (MULTIVITAMIN WITH MINERALS) TABS tablet Take 1 tablet by mouth daily.    . ondansetron (ZOFRAN) 4 MG tablet Take 1 tablet (4 mg total) by mouth every 6 (six) hours as needed for nausea. 20 tablet 0  . pantoprazole (PROTONIX) 40 MG tablet Take 1 tablet (40 mg total) by mouth daily. 30 tablet 4  . celecoxib (CELEBREX) 200 MG capsule Take 200 mg by mouth daily.  (Patient not taking: Reported on 08/25/2019)      Review of Systems: GENERAL:  Fatigue.  No fevers, sweats or weight loss. PERFORMANCE  STATUS (ECOG):  2 HEENT:  No visual changes, runny nose, sore throat, mouth sores or tenderness. Lungs: No shortness of breath or cough.  No hemoptysis. Cardiac:  No chest pain, palpitations, orthopnea, or PND. GI:  Difficulty swallowing.  Diarrhea.  No nausea, vomiting, constipation, melena or hematochezia. GU:  No urgency, frequency, dysuria, or hematuria. Musculoskeletal:  No back pain.  No joint pain.  No muscle tenderness. Extremities:  No pain or swelling. Skin:  No rashes or skin changes. Neuro:  Generalized weakness.  No headache, numbness or focal weakness.  In rehab to assist with walking and building strength. Endocrine:  No diabetes, thyroid issues, hot flashes or night sweats. Psych:  No mood changes, depression or anxiety. Pain:  No focal pain. Review of systems:  All other systems reviewed and found to be negative.  Physical Exam:  Blood pressure 122/81, pulse 74, temperature 97.6 F (36.4 C), temperature  source Oral, resp. rate 17, height 5\' 4"  (1.626 m), weight 163 lb 12.8 oz (74.3 kg), SpO2 97 %.  GENERAL:  Chronically fatigued appearing gentleman lying comfortably on the medical unit in no acute distress. MENTAL STATUS:  Alert and oriented to person, place and time. HEAD:  Short brown hair.  Normocephalic, atraumatic, face symmetric, no Cushingoid features. EYES:  Pupils equal round and reactive to light and accomodation.  No conjunctivitis or scleral icterus. ENT:  Dubbhoff tube in place.  Oropharynx clear without lesion.  Tongue normal. Mucous membranes moist.  RESPIRATORY:  Clear to auscultation anteriorly without rales, wheezes or rhonchi. CARDIOVASCULAR:  Regular rate and rhythm without murmur, rub or gallop. ABDOMEN:  Soft, non-tender, with active bowel sounds, and no hepatosplenomegaly.  No masses. SKIN:  No rashes, ulcers or lesions. EXTREMITIES: Right upper extremity edema.  Heels with pressure dressings in place.  No palpable cords. LYMPH NODES: No palpable  cervical, supraclavicular, axillary or inguinal adenopathy  NEUROLOGICAL: Unremarkable. PSYCH:  Appropriate.   Results for orders placed or performed during the hospital encounter of 08/24/19 (from the past 48 hour(s))  CBC     Status: Abnormal   Collection Time: 08/31/19  6:04 AM  Result Value Ref Range   WBC 7.8 4.0 - 10.5 K/uL   RBC 3.26 (L) 4.22 - 5.81 MIL/uL   Hemoglobin 9.4 (L) 13.0 - 17.0 g/dL   HCT 29.4 (L) 39 - 52 %   MCV 90.2 80.0 - 100.0 fL   MCH 28.8 26.0 - 34.0 pg   MCHC 32.0 30.0 - 36.0 g/dL   RDW 18.0 (H) 11.5 - 15.5 %   Platelets 227 150 - 400 K/uL   nRBC 0.0 0.0 - 0.2 %    Comment: Performed at [facility], [address], [place], [place] [address]  Basic metabolic panel     Status: Abnormal   Collection Time: 08/31/19  6:04 AM  Result Value Ref Range   Sodium 151 (H) 135 - 145 mmol/L   Potassium 3.8 3.5 - 5.1 mmol/L   Chloride 122 (H) 98 - 111 mmol/L   CO2 19 (L) 22 - 32 mmol/L   Glucose, Bld 86 70 - 99 mg/dL    Comment: Glucose reference range applies only to samples taken after fasting for at least 8 hours.   BUN 19 8 - 23 mg/dL   Creatinine, Ser 1.14 0.61 - 1.24 mg/dL   Calcium 7.6 (L) 8.9 - 10.3 mg/dL   GFR calc non Af Amer 59 (L) >60 mL/min   GFR calc Af Amer >60 >60 mL/min   Anion gap 10 5 - 15    Comment: Performed at [facility], [address] [place], [place] [address]  Magnesium     Status: None   Collection Time: 08/31/19  6:04 AM  Result Value Ref Range   Magnesium 1.8 1.7 - 2.4 mg/dL    Comment: Performed at [facility], [address] [place], [place] [address]  Phosphorus     Status: None   Collection Time: 08/31/19  6:04 AM  Result Value Ref Range   Phosphorus 3.7 2.5 - 4.6 mg/dL    Comment: Performed at [facility], [address] [place], [place] [address]  Basic metabolic panel     Status: Abnormal   Collection Time: 09/01/19  5:26 AM  Result Value Ref Range   Sodium 151 (H) 135 -  145 mmol/L   Potassium 4.0 3.5 - 5.1 mmol/L  Chloride 122 (H) 98 - 111 mmol/L   CO2 21 (L) 22 - 32 mmol/L   Glucose, Bld 141 (H) 70 - 99 mg/dL    Comment: Glucose reference range applies only to samples taken after fasting for at least 8 hours.   BUN 18 8 - 23 mg/dL   Creatinine, Ser 1.06 0.61 - 1.24 mg/dL   Calcium 7.7 (L) 8.9 - 10.3 mg/dL   GFR calc non Af Amer >60 >60 mL/min   GFR calc Af Amer >60 >60 mL/min   Anion gap 8 5 - 15    Comment: Performed at [facility], [address] [place], [place] [address]  Phosphorus     Status: None   Collection Time: 09/01/19  5:26 AM  Result Value Ref Range   Phosphorus 3.4 2.5 - 4.6 mg/dL    Comment: Performed at [facility], [address] [place], [place] [address]  Magnesium     Status: None   Collection Time: 09/01/19  5:26 AM  Result Value Ref Range   Magnesium 1.7 1.7 - 2.4 mg/dL    Comment: Performed at [facility], [address] [place], [place] [address]  CBC     Status: Abnormal   Collection Time: 09/01/19  5:26 AM  Result Value Ref Range   WBC 8.0 4.0 - 10.5 K/uL   RBC 3.14 (L) 4.22 - 5.81 MIL/uL   Hemoglobin 8.9 (L) 13.0 - 17.0 g/dL   HCT 29.2 (L) 39 - 52 %   MCV 93.0 80.0 - 100.0 fL   MCH 28.3 26.0 - 34.0 pg   MCHC 30.5 30.0 - 36.0 g/dL   RDW 18.4 (H) 11.5 - 15.5 %   Platelets 216 150 - 400 K/uL   nRBC 0.0 0.0 - 0.2 %    Comment: Performed at [facility], [address], [place], [place] [address]  Pathologist smear review     Status: None   Collection Time: 09/01/19  5:26 AM  Result Value Ref Range   Path Review Blood smear is reviewed.     Comment: Patient presents with sepsis and C difficile colitis. Normocytic anemia.  Morphology of RBCs, WBCs, and platelets within normal limits. No evidence of circulating blasts, schistocytes, or smudge cells. Reviewed by Elmon Kirschner, M.D. Performed at [facility], [address] [place], [place] [address]     MR ABDOMEN W WO CONTRAST  Result Date: 08/31/2019 CLINICAL DATA:  Evaluate kidney cyst. EXAM: MRI ABDOMEN WITHOUT AND WITH CONTRAST TECHNIQUE: Multiplanar multisequence MR imaging of the abdomen was performed both before and after the administration of intravenous contrast. CONTRAST:  80mL GADAVIST GADOBUTROL 1 MMOL/ML IV SOLN COMPARISON:  MRI scratch set CT 08/09/2019 FINDINGS: Lower chest: Moderate bilateral pleural effusions with overlying compressive type atelectasis. Hepatobiliary: No focal liver abnormality identified. Tiny stones and/or sludge noted layering within the dependent portion of the gallbladder. No gallbladder wall thickening. Pericholecystic fluid noted. No intrahepatic bile duct dilatation. Normal caliber common bile duct. Pancreas: No mass, inflammatory changes, or other parenchymal abnormality identified. Spleen:  Within normal limits in size and appearance. Adrenals/Urinary Tract: Normal appearance of the adrenal glands. 9 mm calcified lesion arising from the medial cortex of the inferior pole of the right kidney is identified, image 27/11. Unchanged. No definite solid enhancing component noted. Several subcentimeter cystic lesions are noted within the left kidney. The largest is in the left mid parapelvic region measuring 0.7 cm. These are technically too small to  reliably characterize. No hydronephrosis identified bilaterally. Stomach/Bowel: Small hiatal hernia. Stomach is nondistended. No dilated loops of bowel identified. Numerous colonic diverticula noted. Vascular/Lymphatic: Extensive aortic atherosclerosis without aneurysm. The portal vein and splenic vein appear patent. No upper abdominal adenopathy. Other: Small to moderate volume of ascites is identified and appears increased from previous exam. There is mesenteric edema as well as diffuse body wall edema compatible with anasarca. Musculoskeletal: No suspicious bone lesions identified. IMPRESSION: 1. Stable appearance of  calcified lesion arising from the medial cortex of the inferior pole of the right kidney. No definite solid enhancing component noted. Finding is favored to represent a benign process. Consider follow-up imaging in 12 months with repeat CT or MRI without and with contrast material. 2. Small to moderate volume of ascites, mesenteric edema and diffuse body wall edema compatible with anasarca. 3. Moderate bilateral pleural effusions with overlying compressive type atelectasis. 4. Small hiatal hernia. 5. Sludge and/or stones identified layering within the gallbladder. No signs of gallbladder wall inflammation or biliary ductal dilatation. 6.  Aortic Atherosclerosis (ICD10-I70.0). Electronically Signed   By: Kerby Moors M.D.   On: 08/31/2019 05:16   DG Loyce Dys Tube Plc W/Fl W/Rad  Result Date: 08/31/2019 CLINICAL DATA:  Dobbhoff tube placement. EXAM: NASO G TUBE PLACEMENT WITH FL AND WITH RAD CONTRAST:  None. FLUOROSCOPY TIME:  Fluoroscopy Time:  5 minutes 12 seconds Radiation Exposure Index (if provided by the fluoroscopic device): 98.2 mGy COMPARISON:  Prior study 08/29/2019. FINDINGS: Dobbhoff tube was advanced in usual fashion down the esophagus/stomach and into the proximal duodenum under fluoroscopic guidance. There were no complications. Dobbhoff tube secured and patient sent back to his room in stable condition. IMPRESSION: Successful Dobbhoff tube placement under fluoroscopic guidance. Tube tip positioned in the proximal duodenum. Electronically Signed   By: Maxwell   On: 08/31/2019 10:14   DG Swallowing Func-Speech Pathology  Result Date: 08/31/2019 Objective Swallowing Evaluation: Type of Study: MBS-Modified Barium Swallow Study  Patient Details Name: Albert [name] MRN: [id number] Date of Birth: [birth year] Today's Date: 08/31/2019 Time: SLP Start Time (ACUTE ONLY): 0900 -SLP Stop Time (ACUTE ONLY): 0924 SLP Time Calculation (min) (ACUTE ONLY): 24 min Past Medical History: Past Medical History:  Diagnosis Date . Actinic keratosis 04/23/2008  L elbow distal tricep (bx proven) . Basal cell carcinoma 06/17/2006  L preauricular  . Basal cell carcinoma 05/26/2012  L ant neck  . Basal cell carcinoma 11/02/2012  Glabella - excision  . Basal cell carcinoma 08/15/2014  L med ankle  . Basal cell carcinoma 02/23/2017  R lat infrapectoral - excision . Basal cell carcinoma 12/08/2017  L mid to distal lat volar forearm  . Hypertension  . Squamous cell carcinoma of skin 02/14/2014  R lat low back - ED&C . Squamous cell carcinoma of skin 10/02/2014  R prox lat bicep - ED&C Past Surgical History: Past Surgical History: Procedure Laterality Date . COLONOSCOPY N/A 08/05/2019  Procedure: COLONOSCOPY;  Surgeon: Toledo, Benay Pike, MD;  Location: [facility] ENDOSCOPY;  Service: Gastroenterology;  Laterality: N/A; . ESOPHAGEAL DILATION N/A 08/30/2019  Procedure: ESOPHAGEAL DILATION;  Surgeon: Margaretha Sheffield, MD;  Location: [facility] ORS;  Service: ENT;  Laterality: N/A; . ESOPHAGOGASTRODUODENOSCOPY N/A 08/05/2019  Procedure: ESOPHAGOGASTRODUODENOSCOPY (EGD);  Surgeon: Toledo, Benay Pike, MD;  Location: [facility];  Service: Gastroenterology;  Laterality: N/A; . ESOPHAGOSCOPY N/A 08/30/2019  Procedure: ESOPHAGOSCOPY;  Surgeon: Margaretha Sheffield, MD;  Location: [facility] ORS;  Service: ENT;  Laterality: N/A; HPI: LINDY PENNISI is a 86 y.o.  male with PMH as noted below presents from his facility with fever and low blood pressure today.  Previously he had been admitted for upper GI bleed, EGD performed and pt diagnosed with esophageal and duodenal ulcers at the end of last month.  Chest x-ray indicates chronic appearing increased lung markings with mild right basilar infiltrate. ST consulted as pt reports difficulty swallowing.  Subjective: pt pleasant, conversant, fair historian Assessment / Plan / Recommendation CHL IP CLINICAL IMPRESSIONS 08/31/2019 Clinical Impression Pt continues to present with severe pharyngoesophageal dysphagia that has worsened  since previous MBSS on 08/28/2019. As such he is at an [place] (even with his own salvia). Of note, pt's UES was dilated 08/30/2019. During this study, pt's UES opened less than previous study. Therefore more of EACH BOLUS was SILENTLY ASPIRATED with almost all of the puree bolus was aspirated. Massive amounts of residue remained in throughout the pharynx. Pt was not able to clear with increased swallows. During the repeat swallows, he actually aspirated the residue during the attempt to swallow. Compensatory swallow strategies were attempted but none were effective. Pt attempted to "hock" up pharyngeal residue at the end. He was able to expell some of the bolus but not enough to clear his pharynx. Essentially pt has frank silent aspiration before, during and after the swallow across thin liquids, nectar thick liquids and puree. At this time, it appears that pt's decrease hyolaryngeal excursion and opening of the UES is likely neuromuscular in nature. Recommend long-term nutritional support. Recommend STRICT ORAL CARE to REDUCE bacterial load present in pt's salvia.  SLP Visit Diagnosis Dysphagia, pharyngoesophageal phase (R13.14) Attention and concentration deficit following -- Frontal lobe and executive function deficit following -- Impact on safety and function Severe aspiration risk;Risk for inadequate nutrition/hydration   CHL IP TREATMENT RECOMMENDATION 08/31/2019 Treatment Recommendations No treatment recommended at this time   Prognosis 08/31/2019 Prognosis for Safe Diet Advancement Guarded Barriers to Reach Goals Severity of deficits Barriers/Prognosis Comment -- CHL IP DIET RECOMMENDATION 08/31/2019 SLP Diet Recommendations NPO Liquid Administration via -- Medication Administration Via alternative means Compensations -- Postural Changes --   CHL IP OTHER RECOMMENDATIONS 08/31/2019 Recommended Consults -- Oral Care Recommendations Oral care QID Other Recommendations Remove water  pitcher;Prohibited food (jello, ice cream, thin soups);Have oral suction available   CHL IP FOLLOW UP RECOMMENDATIONS 08/31/2019 Follow up Recommendations Skilled Nursing facility   [facility] IP FREQUENCY AND DURATION 08/28/2019 Speech Therapy Frequency (ACUTE ONLY) min 2x/week Treatment Duration 2 weeks      CHL IP ORAL PHASE 08/31/2019 Oral Phase Impaired Oral - Pudding Teaspoon -- Oral - Pudding Cup -- Oral - Honey Teaspoon -- Oral - Honey Cup -- Oral - Nectar Teaspoon Weak lingual manipulation;Lingual pumping;Reduced posterior propulsion;Piecemeal swallowing;Delayed oral transit;Decreased bolus cohesion;Premature spillage Oral - Nectar Cup -- Oral - Nectar Straw Weak lingual manipulation;Lingual pumping;Reduced posterior propulsion;Piecemeal swallowing;Decreased bolus cohesion;Delayed oral transit;Premature spillage Oral - Thin Teaspoon Weak lingual manipulation;Lingual pumping;Reduced posterior propulsion;Piecemeal swallowing;Delayed oral transit;Decreased bolus cohesion;Premature spillage Oral - Thin Cup -- Oral - Thin Straw Weak lingual manipulation;Lingual pumping;Reduced posterior propulsion;Piecemeal swallowing;Delayed oral transit;Decreased bolus cohesion;Premature spillage Oral - Puree Weak lingual manipulation;Lingual pumping;Reduced posterior propulsion;Piecemeal swallowing;Delayed oral transit;Decreased bolus cohesion;Premature spillage Oral - Mech Soft -- Oral - Regular -- Oral - Multi-Consistency -- Oral - Pill -- Oral Phase - Comment more impaired over previous MBSS on 08/28/2019  CHL IP PHARYNGEAL PHASE 08/31/2019 Pharyngeal Phase Impaired Pharyngeal- Pudding Teaspoon -- Pharyngeal -- Pharyngeal- Pudding Cup -- Pharyngeal -- Pharyngeal- Honey  Teaspoon -- Pharyngeal -- Pharyngeal- Honey Cup -- Pharyngeal -- Pharyngeal- Nectar Teaspoon Delayed swallow initiation-vallecula;Delayed swallow initiation-pyriform sinuses;Reduced epiglottic inversion;Reduced anterior laryngeal mobility;Reduced laryngeal  elevation;Reduced airway/laryngeal closure;Penetration/Aspiration before swallow;Penetration/Aspiration during swallow;Penetration/Apiration after swallow;Significant aspiration (Amount);Pharyngeal residue - valleculae;Pharyngeal residue - pyriform;Pharyngeal residue - posterior pharnyx;Pharyngeal residue - cp segment;Reduced pharyngeal peristalsis Pharyngeal Material enters airway, passes BELOW cords without attempt by patient to eject out (silent aspiration) Pharyngeal- Nectar Cup -- Pharyngeal -- Pharyngeal- Nectar Straw Delayed swallow initiation-vallecula;Delayed swallow initiation-pyriform sinuses;Reduced pharyngeal peristalsis;Reduced epiglottic inversion;Reduced anterior laryngeal mobility;Reduced laryngeal elevation;Reduced airway/laryngeal closure;Penetration/Aspiration before swallow;Penetration/Aspiration during swallow;Penetration/Apiration after swallow;Significant aspiration (Amount);Pharyngeal residue - valleculae;Pharyngeal residue - pyriform;Pharyngeal residue - posterior pharnyx;Pharyngeal residue - cp segment Pharyngeal Material enters airway, passes BELOW cords without attempt by patient to eject out (silent aspiration) Pharyngeal- Thin Teaspoon Delayed swallow initiation-vallecula;Delayed swallow initiation-pyriform sinuses;Reduced pharyngeal peristalsis;Reduced epiglottic inversion;Reduced anterior laryngeal mobility;Reduced laryngeal elevation;Reduced airway/laryngeal closure;Penetration/Aspiration before swallow;Penetration/Aspiration during swallow;Penetration/Apiration after swallow;Significant aspiration (Amount);Pharyngeal residue - valleculae;Pharyngeal residue - pyriform;Pharyngeal residue - posterior pharnyx;Pharyngeal residue - cp segment Pharyngeal Material enters airway, passes BELOW cords without attempt by patient to eject out (silent aspiration) Pharyngeal- Thin Cup NT Pharyngeal -- Pharyngeal- Thin Straw NT Pharyngeal -- Pharyngeal- Puree Delayed swallow  initiation-vallecula;Delayed swallow initiation-pyriform sinuses;Reduced pharyngeal peristalsis;Reduced epiglottic inversion;Reduced anterior laryngeal mobility;Reduced laryngeal elevation;Reduced airway/laryngeal closure;Penetration/Aspiration before swallow;Penetration/Aspiration during swallow;Penetration/Apiration after swallow;Significant aspiration (Amount);Pharyngeal residue - valleculae;Pharyngeal residue - pyriform;Pharyngeal residue - posterior pharnyx;Pharyngeal residue - cp segment Pharyngeal Material enters airway, passes BELOW cords without attempt by patient to eject out (silent aspiration) Pharyngeal- Mechanical Soft -- Pharyngeal -- Pharyngeal- Regular -- Pharyngeal -- Pharyngeal- Multi-consistency -- Pharyngeal -- Pharyngeal- Pill -- Pharyngeal -- Pharyngeal Comment --  CHL IP CERVICAL ESOPHAGEAL PHASE 08/31/2019 Cervical Esophageal Phase Impaired Pudding Teaspoon -- Pudding Cup -- Honey Teaspoon -- Honey Cup -- Nectar Teaspoon Reduced cricopharyngeal relaxation Nectar Cup -- Nectar Straw Reduced cricopharyngeal relaxation Thin Teaspoon Reduced cricopharyngeal relaxation Thin Cup NT Thin Straw NT Puree Reduced cricopharyngeal relaxation Mechanical Soft -- Regular -- Multi-consistency -- Pill -- Cervical Esophageal Comment less UES opening for bolus than previous study on 08/28/2019 Happi B. Rutherford Nail M.S., CCC-SLP, [place] Office [phone] Albert [name] 08/31/2019, 12:47 PM               Assessment:  The patient is a 86 y.o.  gentleman with chronic kidney disease, and hypertension who was admitted through the emergency room with fever and hypotension.  He has a history of upper GI bleeding secondary to esophageal and duodenal ulcers.    He completed 5 days of Unasyn for RLL aspiration pneumonia.  He is on oral vancomycin for C difficile colitis.  He has a history of smudge cells seen on peripheral smear.  CBC on 09/01/2019 reveals a hematocrit  of 29.2, hemoglobin 8.9, MCV 93.0, platelets 216,000, WBC 8000.  He has had chronic anemia since 11/2018.  WBC has ranged between 5,000 - 25,000 without trend.  Differential has been predominantly neutrophils.  Platelet count has ranged between 99,000 - 239,000 without trend.  Ferritin was 176 and iron saturation of 9% and a TIBC of 116 on 08/25/2019.  B12 was 300 on 08/26/2019.  Peripheral smear revealed normocytic anemia.  Morphology of RBCs, WBCs and platelets were within normal limits.  There were no evidence of circulating blasts, schistocytes or smudge cells.  Colonoscopy on 08/05/2019 revealed a poor colon prep.  There was diverticulosis in the sigmoid colon.  There was blood and stool in the rectum and in the sigmoid  colon.   EGD on 08/05/2019 revealed an esophageal ulcer with no stigmata of recent bleeding.  There was LA Grade A reflux esophagitis with no bleeding and a 2 cm hiatal hernia.  There was erythematous mucosa in the antrum and non-bleeding duodenal ulcers with no stigmata of bleeding.  Symptomatically, he feels weak.  He was in rehab prior to admission working on getting stronger and walking independently.  He denies any issues with recurrent infections.  He denies any fevers, sweats or weight loss.  He denies early satiety.  He has a Dubbhoff tube in place.  Plan:   1.   Smudge cells  Peripheral smear today reveals no smudge cells.  Differential on 08/24/2019 revealed predominantely leukocytosis with an absolute lymphocyte count (ALC) of 900.  Smudge cells typically indicative of CLL.  Patient denies any h/o recurrent infections.  He denies any B symptoms.  Exam reveals no adenopathy or hepatosplenomegaly.  Peripheral smear sent for flow cytometry. 2.  Normocytic anemia  Patient has a history of GI bleeding.  No current evidence of active bleeding.  Suspect ferritin is falsely elevated (acute phase reactant).  Some component of anemia of chronic disease.  B12 300 (low  normal).  Creatinine 1.06 (CrCl 47.8 ml/min).  Check retic, folate, sed rate.  Thank you for allowing me to participate in Albert [name] 's care.  I will follow him closely with you while hospitalized and after discharge in the outpatient department.   Lequita Asal, MD  09/01/2019, 6:27 PM

## 2019-09-01 NOTE — Progress Notes (Signed)
PT Cancellation Note  Patient Details Name: Albert [name] MRN: [id number] DOB: Feb 25, 1933   Cancelled Treatment:     PT attempt. Pt is bed with RN in room. He is very pleasant however fatigued from days events. POC establish to continue to have PT and plan to go to SNF at [place]. Will continue to follow pt per POC and progress as able per pt tolerance. Pt too fatigued for OOB activity at this time. Will return at later time/date.    Willette Pa 09/01/2019, 4:49 PM

## 2019-09-01 NOTE — Progress Notes (Signed)
Daily Progress Note   Patient Name: Albert [name]       Date: 09/01/2019 DOB: [birth year]  Age: 86 y.o. MRN#: [id number] Attending Physician: Albert Clamp, MD Primary Care Physician: Albert Pitch, MD Admit Date: 08/24/2019  Reason for Consultation/Follow-up: Establishing goals of care  Subjective: Met at bedside with patient, his family, and Dr. Curly [name]. Dr. Curly [name] kindly explained to patient and family his possible diagnosis and options for treatment, along with risks. It was made clear to patient that there is [name] that treatment would help to restore his strength and swallow function- however, there is also [name] that it will not. Discussed that continued aggressive medical care would include the need for PEG, and longer hospitalization.  Albert [name] clearly stated that he would like to proceed with attempts at treatment if they would provide an improvement in his functional state. He is agreeable to PEG placement.  It was noted that he remains at risk for aspiration pneumonia, and he also has the agency to speak out if continuing with treatment is making him feel worse- or he prefers to stop and discharge home. He was concerned that PEG would be permanent. We discussed that it could be temporary and he could have it removed at any time if his goals of care change.  At close of discussion family and patient agreed they would like to continue aggressive care and trial of steroids and IVIG.    Length of Stay: 7  Current Medications: Scheduled Meds:  . enoxaparin (LOVENOX) injection  40 mg Subcutaneous Q24H  . feeding supplement (PROSource TF)  45 mL Per Tube Daily  . free water  100 mL Per Tube Q4H  . [START ON 09/03/2019] iohexol  500 mL Per Tube Q1H  . metoprolol tartrate  2.5 mg  Intravenous Q12H  . pantoprazole (PROTONIX) IV  40 mg Intravenous Q12H  . vancomycin  125 mg Per Tube QID    Continuous Infusions: . sodium chloride 250 mL (08/29/19 0621)  . dextrose 5 % and 0.45 % NaCl with KCl 20 mEq/L 50 mL/hr at 09/01/19 0838  . feeding supplement (OSMOLITE 1.5 CAL) 1,000 mL (08/31/19 1700)    PRN Meds: sodium chloride, acetaminophen **OR** acetaminophen        Vital Signs: BP 122/81 (BP Location: Right Arm)   Pulse 74  Temp 97.6 F (36.4 C) (Oral)   Resp 17   Ht '5\' 4"'  (1.626 m)   Wt 74.3 kg   SpO2 97%   BMI 28.12 kg/m  SpO2: SpO2: 97 % O2 Device: O2 Device: Room Air O2 Flow Rate: O2 Flow Rate (L/min): 6 L/min  Intake/output summary:   Intake/Output Summary (Last 24 hours) at 09/01/2019 1531 Last data filed at 09/01/2019 1500 Gross per 24 hour  Intake 3601.7 ml  Output 300 ml  Net 3301.7 ml   LBM: Last BM Date: 08/31/19 Baseline Weight: Weight: 69.8 kg Most recent weight: Weight: 74.3 kg       Palliative Assessment/Data: PPS: 10%   Flowsheet Rows     Most Recent Value  Intake Tab  Referral Department Hospitalist  Unit at Time of Referral Med/Surg Unit  Palliative Care Primary Diagnosis Other (Comment)  Date Notified 08/28/19  Palliative Care Type New Palliative care  Reason for referral Clarify Goals of Care  Date of Admission 08/24/19  Date first seen by Palliative Care 08/29/19  # of days Palliative referral response time 1 Day(s)  # of days IP prior to Palliative referral 4  Clinical Assessment  Psychosocial & Spiritual Assessment  Palliative Care Outcomes      Patient Active Problem List   Diagnosis Date Noted  . Autoimmune disease ([place])   . Palliative care by specialist   . Advanced care planning/counseling discussion   . Goals of care, counseling/discussion   . Hypomagnesemia   . Hypokalemia   . Aspiration pneumonia of right lower lobe due to gastric secretions ([place])   . Dysphagia   . C. difficile colitis   .  Ulcer of esophagus without bleeding   . Lobar pneumonia ([place])   . Wide-complex tachycardia ([place])   . Sepsis ([place]) 08/24/2019  . Lactic acidosis 08/24/2019  . Anemia 08/24/2019  . Elevated brain natriuretic peptide (BNP) level 08/24/2019  . Protein-calorie malnutrition, severe 08/12/2019  . GIB (gastrointestinal bleeding) 08/10/2019  . GI bleed 08/09/2019  . Acute GI bleeding 08/04/2019  . Symptomatic anemia 08/04/2019  . Coagulopathy ([place]) 08/04/2019  . Metabolic acidosis 61/95/0932  . SIRS (systemic inflammatory response syndrome) ([place]) 08/04/2019  . UGI bleed 08/04/2019  . Dehydration 11/24/2018  . CKD (chronic kidney disease), stage III 11/24/2018  . Generalized weakness 11/22/2018  . Acute renal failure superimposed on stage 3a chronic kidney disease ([place])   . Elevated troponin   . Hyperlipidemia 06/15/2014  . Benign essential hypertension 06/15/2013    Palliative Care Assessment & Plan   Patient Profile: 86 y.o.malewith past medical history of CKD, HTN, HLD, s/p COVID admission winter 2020, recent admission for GI bleeding dx with esophageal and duodenal ulcers,admitted on8/19/2021with sepsis r/t aspiration pneumonia and C. Difficile infection.Admission complicated by severe dysphagia, and diagnosis of a fib. ENT and neurology consulted for dysphagia, NG tube has been placed and he is NPO.He has undergone esophageal dilatation by ENT and is being worked up by Neurology for myositis or other neurological/autoimmune/inflammatory process that may be causing his decline. Palliative medicine consulted for goals of care.   Assessment/Recommendations/Plan   Plan for full scope/full code  PMT will follow along and readdress GOC as needed  Patient would want CPR and resuscitation- but would not want to be kept on vent long term  Goals of Care and Additional Recommendations:  Limitations on Scope of Treatment: Full Scope Treatment  Code Status:  Full  code  Prognosis:   Unable to determine  Discharge Planning:  To Be Determined  Care plan was discussed with patient, family, and care team.  Thank you for allowing the Palliative Medicine Team to assist in the care of this patient.   Time In: 1354 Time Out: 1403 Total Time 69 minutes Prolonged Time Billed no      Greater than 50%  of this time was spent counseling and coordinating care related to the above assessment and plan.  Mariana Kaufman, AGNP-C Palliative Medicine   Please contact Palliative Medicine Team phone at [phone] for questions and concerns.

## 2019-09-01 NOTE — Plan of Care (Signed)
  Problem: Education: Goal: Knowledge of General Education information will improve Description: Including pain rating scale, medication(s)/side effects and non-pharmacologic comfort measures Outcome: Progressing   Problem: Clinical Measurements: Goal: Will remain free from infection Outcome: Progressing   Problem: Pain Managment: Goal: General experience of comfort will improve Outcome: Progressing   Problem: Safety: Goal: Ability to remain free from injury will improve Outcome: Progressing   Problem: Nutrition: Goal: Adequate nutrition will be maintained Note: On feeding tube

## 2019-09-02 ENCOUNTER — Inpatient Hospital Stay: Payer: Medicare Other

## 2019-09-02 ENCOUNTER — Encounter: Payer: Self-pay | Admitting: Internal Medicine

## 2019-09-02 LAB — BASIC METABOLIC PANEL
Anion gap: 7 (ref 5–15)
BUN: 21 mg/dL (ref 8–23)
CO2: 18 mmol/L — ABNORMAL LOW (ref 22–32)
Calcium: 7.4 mg/dL — ABNORMAL LOW (ref 8.9–10.3)
Chloride: 120 mmol/L — ABNORMAL HIGH (ref 98–111)
Creatinine, Ser: 1.03 mg/dL (ref 0.61–1.24)
GFR calc Af Amer: >60 mL/min (ref >60)
GFR calc non Af Amer: >60 mL/min (ref >60)
Glucose, Bld: 130 mg/dL — ABNORMAL HIGH (ref 70–99)
Potassium: 4.4 mmol/L (ref 3.5–5.1)
Sodium: 145 mmol/L (ref 135–145)

## 2019-09-02 LAB — RETICULOCYTES
Immature Retic Fract: 26.1 % — ABNORMAL HIGH (ref 2.3–15.9)
RBC.: 3.14 MIL/uL — ABNORMAL LOW (ref 4.22–5.81)
Retic Count, Absolute: 66.3 10*3/uL (ref 19.0–186.0)
Retic Ct Pct: 2.1 % (ref 0.4–3.1)

## 2019-09-02 LAB — CBC
HCT: 29.7 % — ABNORMAL LOW (ref 39.0–52.0)
Hemoglobin: 9.3 g/dL — ABNORMAL LOW (ref 13.0–17.0)
MCH: 28.6 pg (ref 26.0–34.0)
MCHC: 31.3 g/dL (ref 30.0–36.0)
MCV: 91.4 fL (ref 80.0–100.0)
Platelets: 189 10*3/uL (ref 150–400)
RBC: 3.25 MIL/uL — ABNORMAL LOW (ref 4.22–5.81)
RDW: 18.4 % — ABNORMAL HIGH (ref 11.5–15.5)
WBC: 8.2 10*3/uL (ref 4.0–10.5)
nRBC: 0.2 % (ref 0.0–0.2)

## 2019-09-02 LAB — MAGNESIUM: Magnesium: 1.5 mg/dL — ABNORMAL LOW (ref 1.7–2.4)

## 2019-09-02 LAB — PHOSPHORUS: Phosphorus: 3.1 mg/dL (ref 2.5–4.6)

## 2019-09-02 LAB — SEDIMENTATION RATE: Sed Rate: 83 mm/hr — ABNORMAL HIGH (ref 0–20)

## 2019-09-02 LAB — FOLATE: Folate: 8.3 ng/mL (ref >5.9)

## 2019-09-02 LAB — CORTISOL-AM, BLOOD: Cortisol - AM: 16.2 ug/dL (ref 6.7–22.6)

## 2019-09-02 MED ORDER — IOHEXOL 300 MG/ML  SOLN
75.0000 mL | Freq: Once | INTRAMUSCULAR | Status: AC | PRN
  Administered 2019-09-02: 75 mL via INTRAVENOUS

## 2019-09-02 MED ORDER — MAGNESIUM SULFATE 2 GM/50ML IV SOLN
2.0000 g | Freq: Once | INTRAVENOUS | Status: AC
  Administered 2019-09-02: 2 g via INTRAVENOUS
  Filled 2019-09-02: qty 50

## 2019-09-02 NOTE — Progress Notes (Signed)
PROGRESS NOTE    Albert [name]  IRC:[id number] DOB: 1933/06/24 DOA: 08/24/2019   PCP: [name], MD    Brief Narrative: Albert [name] is 86 y.o. male with PMH of CKD, HTN, HLD with recent admission for GI bleed and urosepsis presents from SNF with concerns for fever and hypotensive. Patient endorses cough for 3-4 days with sputum production. Also reports 2-3 weeks of dysuria. Denies urinary urgency or frequency. Denies further melena or blood in stool. No hematemesis or hemoptysis.  Overall presentation is most consistent with acute infection/sepsis, either recurrent UTI/cystitis, versus possible pulmonary etiology including COVID-19 or bacterial pneumonia. I have a lower suspicion for his abnormal vital signs being related to GI bleed given the lack of any hematemesis, recent melena, or other specific symptoms to suggest GI etiology. He is admitted for sepsis secondary to possible aspiration pneumonia and C. difficile colitis.  He is getting antibiotics.  Given dysphagia he underwent thorough evaluation,  neurology thinks it secondary to autoimmune myopathies but there is concern about myositis or dermatomyositis,  Neurology ordered multiple labs, recommended close rheumatology Out patient follow-up.  Patient underwent  upper esophagoscopy with upper esophageal sphincter dilatation.  Patient underwent modified barium swallow and had failed, palliative care consulted to discuss goals of care,  Patient want full scope of care.  Patient is very high risk for aspiration patient has a NG tube placed by IR.  Plan: Patient will require muscle biopsy for confirmation of diagnosis.  Patient will need PEG tube for nutrition, neurology will discuss the case with oncology.  Assessment & Plan:   Active Problems:   Elevated troponin   Benign essential hypertension   CKD (chronic kidney disease), stage III   Sepsis (HCC)   Lactic acidosis   Anemia   Elevated brain natriuretic peptide (BNP) level    Aspiration pneumonia of right lower lobe due to gastric secretions (HCC)   Dysphagia   C. difficile colitis   Ulcer of esophagus without bleeding   Hypomagnesemia   Hypokalemia   Advanced care planning/counseling discussion   Goals of care, counseling/discussion   Autoimmune disease ([place])   Palliative care by specialist   1. Sepsis, present on admission with hypotension.  This has improved.  Completed treatment for Aspiration pneumonia with Unasyn (day 5 of 5),          C. difficile colitis on vancomycin via NG tube now.  DC IV Flagyl.  2.  C.difficile colitis.  Vancomycin via NG tube.  3. Dysphagia with failed swallow evaluation with aspiration on esophagram and modified barium swallow.       Neurology did an NIF which was better with second time that they did it.  They ordered a MRI of the brain and cervical spine to rule out any other etiology of      trouble swallowing.     MRI : Findings may be concerning for myositis or muscular injury.  Recommended labs and close outpatient rheumatology follow-up.    ENT; Patient underwent rigid esophagoscopy with dilatation of the stricture.  Tolerated well.  Interventional radiology can potentially do a PEG feeding tube after    completion of treatment for C. difficile colitis.  Currently has NG tube. Can either resume  tube feeding or trial of swallowing after EUS dilation.     Patient failed modified barium swallow, speech recommended n.p.o. alternate ways of feeding.  Palliative consulted to discuss goals of care.  Plan:  Patient needs PEG tube after completion of treatment  for C. difficile.  Continue NG tube for now.            Patient will need muscular biopsy for confirmation of diagnosis, patient will need PEG tube on Monday.  4. Hyperlipidemia.  Hold atorvastatin.  5. Hypomagnesemia >>> Improved.  this was replaced,  recheck electrolytes tomorrow.  6. Hypokalemia  >>> Improved.  replace potassium as needed  and IV fluids.  7. Chronic  kidney disease stage II: Renal functions back to base line.   8. Iron deficiency anemia with prior blood loss, esophageal ulcer on recent endoscopy.  Hemoglobin on the lower side but stable.  9. Smudge cells seen on CBC.  Will need referral to hematology as outpatient for likely underlying CLL.  10. Esophageal ulcer and esophagitis on Protonix.   DVT prophylaxis: Lovenox Code Status:  Full Family Communication: No one at bed side, D/W patient. Disposition Plan: Dispo: The patient is from: Rehab  Anticipated d/c is to: Rehab  Anticipated d/c date is: Yet to be determined secondary the patient will have to have some way of getting nutrition  Patient currently being treated for aspiration pneumonia and C. difficile colitis.  Also failed swallow evaluation.              Palliative care consulted to discuss goals of care, patient wants full scope of treatment.              Plan : Family meeting completed, patient wants full scope of treatment.  Scheduled for PEG tube on Monday. Consultants:    ENT, Neurology, cardiology  Procedures:    Antimicrobials:  Anti-infectives (From admission, onward)   Start     Dose/Rate Route Frequency Ordered Stop   08/29/19 1800  vancomycin (VANCOCIN) 50 mg/mL oral solution 125 mg        125 mg Per Tube 4 times daily 08/29/19 1641 09/08/19 1759   08/28/19 1500  vancomycin (VANCOCIN) 500 mg in sodium chloride irrigation 0.9 % 100 mL ENEMA  Status:  Discontinued        500 mg Rectal Every 6 hours 08/28/19 1340 08/29/19 1641   08/28/19 1500  metroNIDAZOLE (FLAGYL) IVPB 500 mg  Status:  Discontinued        500 mg 100 mL/hr over 60 Minutes Intravenous Every 8 hours 08/28/19 1341 08/29/19 1642   08/26/19 1400  vancomycin (VANCOCIN) 50 mg/mL oral solution 125 mg  Status:  Discontinued        125 mg Oral 4 times daily 08/26/19 1255 08/28/19 1340   08/25/19 2000  vancomycin (VANCOCIN) IVPB 1000 mg/200 mL premix  Status:   Discontinued        1,000 mg 200 mL/hr over 60 Minutes Intravenous Every 24 hours 08/25/19 0240 08/25/19 1440   08/25/19 2000  doxycycline (VIBRA-TABS) tablet 100 mg  Status:  Discontinued        100 mg Oral Every 12 hours 08/25/19 1440 08/26/19 1254   08/25/19 1800  Ampicillin-Sulbactam (UNASYN) 3 g in sodium chloride 0.9 % 100 mL IVPB        3 g 200 mL/hr over 30 Minutes Intravenous Every 6 hours 08/25/19 1605 08/29/19 2336   08/25/19 1000  ceFEPIme (MAXIPIME) 2 g in sodium chloride 0.9 % 100 mL IVPB  Status:  Discontinued        2 g 200 mL/hr over 30 Minutes Intravenous Every 12 hours 08/25/19 0731 08/25/19 1548   08/25/19 0900  aztreonam (AZACTAM) 1 g in sodium chloride 0.9 % 100 mL  IVPB  Status:  Discontinued        1 g 200 mL/hr over 30 Minutes Intravenous Every 8 hours 08/25/19 0228 08/25/19 0729   08/25/19 0015  aztreonam (AZACTAM) 2 g in sodium chloride 0.9 % 100 mL IVPB        2 g 200 mL/hr over 30 Minutes Intravenous  Once 08/25/19 0014 08/25/19 0115   08/25/19 0015  metroNIDAZOLE (FLAGYL) IVPB 500 mg  Status:  Discontinued        500 mg 100 mL/hr over 60 Minutes Intravenous Every 8 hours 08/25/19 0014 08/25/19 1548   08/25/19 0015  vancomycin (VANCOCIN) IVPB 1000 mg/200 mL premix        1,000 mg 200 mL/hr over 60 Minutes Intravenous  Once 08/25/19 0014 08/25/19 0222   08/24/19 2145  ceFEPIme (MAXIPIME) 2 g in sodium chloride 0.9 % 100 mL IVPB        2 g 200 mL/hr over 30 Minutes Intravenous  Once 08/24/19 2134 08/24/19 2222   08/24/19 2145  vancomycin (VANCOCIN) IVPB 1000 mg/200 mL premix        1,000 mg 200 mL/hr over 60 Minutes Intravenous  Once 08/24/19 2134 08/24/19 2251     Subjective: Patient was seen and examined at bedside, Overnight events noted.  Patient denies any pain.   He said he wants PEG tube.  Objective: Vitals:   09/01/19 2027 09/02/19 0537 09/02/19 0745 09/02/19 1142  BP: 126/79 128/78 109/62 (!) 115/56  Pulse: 87 (!) 59 75 86  Resp:   19 18    Temp: 97.9 F (36.6 C) (!) 97.5 F (36.4 C) 97.9 F (36.6 C) 97.7 F (36.5 C)  TempSrc: Oral Oral    SpO2: 100% 98% 99% 98%  Weight:  73 kg    Height:        Intake/Output Summary (Last 24 hours) at 09/02/2019 1350 Last data filed at 09/02/2019 0745 Gross per 24 hour  Intake 2281.82 ml  Output 400 ml  Net 1881.82 ml   Filed Weights   08/31/19 0504 09/01/19 0400 09/02/19 0537  Weight: 73 kg 74.3 kg 73 kg    Examination:  General exam: Appears calm and comfortable , NG tube in Rt nostril Respiratory system: Clear to auscultation. Respiratory effort normal. Cardiovascular system: S1 & S2 heard, RRR. No JVD, murmurs, rubs, gallops or clicks. No pedal edema. Gastrointestinal system: Abdomen is nondistended, soft and nontender. No organomegaly or masses felt. Normal bowel sounds heard. Central nervous system: Alert and oriented. No focal neurological deficits. Extremities: No leg edema, no cyanosis. Skin: No rashes, lesions or ulcers Psychiatry: Judgement and insight appear normal. Mood & affect appropriate.     Data Reviewed: I have personally reviewed following labs and imaging studies  CBC: Recent Labs  Lab 08/27/19 0557 08/28/19 0416 08/31/19 0604 09/01/19 0526 09/02/19 0642  WBC 10.0 9.5 7.8 8.0 8.2  HGB 8.4* 8.3* 9.4* 8.9* 9.3*  HCT 26.4* 27.0* 29.4* 29.2* 29.7*  MCV 91.3 91.8 90.2 93.0 91.4  PLT 193 214 227 216 335   Basic Metabolic Panel: Recent Labs  Lab 08/28/19 0416 08/28/19 0416 08/30/19 0607 08/30/19 1403 08/31/19 0604 09/01/19 0526 09/02/19 0642  NA 146*   < > 152* 152* 151* 151* 145  K 3.3*   < > 3.4* 3.7 3.8 4.0 4.4  CL 118*   < > 122* 122* 122* 122* 120*  CO2 19*   < > 19* 22 19* 21* 18*  GLUCOSE 83   < >  96 112* 86 141* 130*  BUN 21   < > 17 17 19 18 21   CREATININE 1.01   < > 1.00 1.00 1.14 1.06 1.03  CALCIUM 7.1*   < > 7.7* 7.3* 7.6* 7.7* 7.4*  MG 1.6*  --   --  1.5* 1.8 1.7 1.5*  PHOS  --   --   --   --  3.7 3.4 3.1   < > = values  in this interval not displayed.   GFR: Estimated Creatinine Clearance: 48.9 mL/min (by C-G formula based on SCr of 1.03 mg/dL). Liver Function Tests: Recent Labs  Lab 08/30/19 1403  AST 36  ALT 14  ALKPHOS 58  BILITOT 0.8  PROT 5.0*  ALBUMIN 1.8*   No results for input(s): LIPASE, AMYLASE in the last 168 hours. No results for input(s): AMMONIA in the last 168 hours. Coagulation Profile: No results for input(s): INR, PROTIME in the last 168 hours. Cardiac Enzymes: Recent Labs  Lab 08/29/19 1338  CKTOTAL 45*   BNP (last 3 results) No results for input(s): PROBNP in the last 8760 hours. HbA1C: No results for input(s): HGBA1C in the last 72 hours. CBG: No results for input(s): GLUCAP in the last 168 hours. Lipid Profile: No results for input(s): CHOL, HDL, LDLCALC, TRIG, CHOLHDL, LDLDIRECT in the last 72 hours. Thyroid Function Tests: No results for input(s): TSH, T4TOTAL, FREET4, T3FREE, THYROIDAB in the last 72 hours. Anemia Panel: Recent Labs    09/02/19 0642  FOLATE 8.3  RETICCTPCT 2.1   Sepsis Labs: No results for input(s): PROCALCITON, LATICACIDVEN in the last 168 hours.  Recent Results (from the past 240 hour(s))  Urine culture     Status: None   Collection Time: 08/24/19  8:37 PM   Specimen: In/Out Cath Urine  Result Value Ref Range Status   Specimen Description   Final    IN/OUT CATH URINE Performed at [facility], [address], [place], [place] [address]    Special Requests   Final    NONE Performed at [facility], [address], [place], [place] [address]    Culture   Final    NO GROWTH Performed at [facility], [place] [address], [place], [place] [address]    Report Status 08/26/2019 FINAL  Final  SARS Coronavirus 2 by RT PCR (hospital order, performed in [facility] hospital lab) Nasopharyngeal Nasopharyngeal Swab     Status: None   Collection Time: 08/24/19  8:53 PM   Specimen: Nasopharyngeal Swab  Result Value  Ref Range Status   SARS Coronavirus 2 NEGATIVE NEGATIVE Final    Comment: (NOTE) SARS-CoV-2 target nucleic acids are NOT DETECTED.  The SARS-CoV-2 RNA is generally detectable in upper and lower respiratory specimens during the acute phase of infection. The lowest concentration of SARS-CoV-2 viral copies this assay can detect is 250 copies / mL. A negative result does not preclude SARS-CoV-2 infection and should not be used as the sole basis for treatment or other patient management decisions.  A negative result may occur with improper specimen collection / handling, submission of specimen other than nasopharyngeal swab, presence of viral mutation(s) within the areas targeted by this assay, and inadequate number of viral copies (<250 copies / mL). A negative result must be combined with clinical observations, patient history, and epidemiological information.  Fact Sheet for Patients:   StrictlyIdeas.no  Fact Sheet for Healthcare Providers: BankingDealers.co.za  This test is not yet approved or  cleared by the  [place] Architectural [employer]ologist and has been authorized for detection and/or diagnosis of SARS-CoV-2 by FDA under an Print production planner (EUA).  This EUA will remain in effect (meaning this test can be used) for the duration of the COVID-19 declaration under Section 564(b)(1) of the Act, 21 U.S.C. section 360bbb-3(b)(1), unless the authorization is terminated or revoked sooner.  Performed at [facility], [address] [place], [place] [address]   Blood Culture (routine x 2)     Status: None   Collection Time: 08/24/19  9:00 PM   Specimen: BLOOD  Result Value Ref Range Status   Specimen Description BLOOD BLOOD LEFT FOREARM  Final   Special Requests   Final    BOTTLES DRAWN AEROBIC AND ANAEROBIC Blood Culture results may not be optimal due to an excessive volume of blood received in culture bottles   Culture   Final     NO GROWTH 5 DAYS Performed at [facility], [address] [place], [place] [address]    Report Status 08/29/2019 FINAL  Final  Blood Culture (routine x 2)     Status: None   Collection Time: 08/24/19  9:05 PM   Specimen: BLOOD  Result Value Ref Range Status   Specimen Description BLOOD BLOOD LEFT HAND  Final   Special Requests   Final    BOTTLES DRAWN AEROBIC AND ANAEROBIC Blood Culture adequate volume   Culture   Final    NO GROWTH 5 DAYS Performed at [facility], [address] [place], [place] [address]    Report Status 08/29/2019 FINAL  Final  MRSA PCR Screening     Status: None   Collection Time: 08/25/19 10:33 PM   Specimen: Nasopharyngeal  Result Value Ref Range Status   MRSA by PCR NEGATIVE NEGATIVE Final    Comment:        The GeneXpert MRSA Assay (FDA approved for NASAL specimens only), is one component of a comprehensive MRSA colonization surveillance program. It is not intended to diagnose MRSA infection nor to guide or monitor treatment for MRSA infections. Performed at [facility], [place], [place] [address]   C Difficile Quick Screen w PCR reflex     Status: Abnormal   Collection Time: 08/26/19 10:22 AM   Specimen: STOOL  Result Value Ref Range Status   C Diff antigen POSITIVE (A) NEGATIVE Final   C Diff toxin POSITIVE (A) NEGATIVE Final   C Diff interpretation Toxin producing C. difficile detected.  Final    Comment: CRITICAL RESULT CALLED TO, READ BACK BY AND VERIFIED WITH: ANNIE RITCHIE AT 1128 08/26/19.PMF Performed at [facility], [address] [place], [place] [address]      Radiology Studies: No results found.  Scheduled Meds: . enoxaparin (LOVENOX) injection  40 mg Subcutaneous Q24H  . feeding supplement (PROSource TF)  45 mL Per Tube Daily  . free water  100 mL Per Tube Q4H  . [START ON 09/03/2019] iohexol  500 mL Per Tube Q1H  . metoprolol tartrate  2.5 mg Intravenous Q12H  .  pantoprazole (PROTONIX) IV  40 mg Intravenous Q12H  . vancomycin  125 mg Per Tube QID   Continuous Infusions: . sodium chloride 250 mL (08/29/19 0621)  . dextrose 5 % and 0.45 % NaCl with KCl 20 mEq/L 50 mL/hr at 09/02/19 0219  . feeding supplement (OSMOLITE 1.5 CAL) 1,000 mL (09/02/19 0924)     LOS: 8 days    Time spent: 25  mins.    Shawna Clamp, MD [facility]   If 7PM-7AM, please contact night-coverage

## 2019-09-02 NOTE — Progress Notes (Addendum)
Neurology Progress Note  Patient ID: Albert [name] is a 86 y.o. male with medical history significant forhypertension, hyperlipidemia,  osteopenia, chronic kidney disease stage IIIa, chronic pain secondary to arthritis, recent GI bleeds, atrial arrhthymias (PACs and PVCs per cardiology, no need for [facility]); neurology was consulted on 8/24 for concern for a neurological cause of his dysphagia  Major interval events/Subjective:  - Oncology consulted, peripheral blood flow cyto studies pending - continues to deny headache, diplopia, nausea or any other acute complaints  - agreeable to PEG  Exam: Vitals:   09/02/19 0537 09/02/19 0745  BP: 128/78 109/62  Pulse: (!) 59 75  Resp:  19  Temp: (!) 97.5 F (36.4 C) 97.9 F (36.6 C)  SpO2: 98% 99%   Constitutional: Chronically ill-appearing,  Psych: Affect appropriate to situation, pleasant and cooperative, intermittently appropriately frustrated but redirectable  Eyes: No scleral injection HENT: temporal wasting, NG tube in place MSK: arthritic changes most notable in the hands, with severe atrophy of bilateral hands Cardiovascular: Irregularly irregular Respiratory: Effort normal, non-labored breathing Skin: Redness of upper chest and resolving rash on arms, stable. Possible crack on fingertips, not clearly meeting description for mechanic's hands or [place]'s papules Extremities: Stable bipedal edema and chronic skin changes in lower extremities; some nail bed changes on toes. Apparent fungal infection of toenails.   Neuro: Mental Status: Patient is awake, alert, oriented to person, place, and situation. Cranial Nerves: II: Pupils are equal, round, and reactive to light. III, IV, VI: EMOI in all directions, no fatigability or ptosis  V: Facial sensation intact to light touch throughout  VII: Facial movement is symmetric.  VIII: hearing is baseline hard of hearing, worse on the right IX, X: Uvula elevates symmetrically  XI: Head turn 5/5  bilaterally (head flexion weak as below) XII: Tongue midline Motor: There is diffuse muscle wasting throughout, including temporal wasting and severe atrophy of the hands. No fasciculations or tremor today.    Stable strength exam: 4/5 head flexion, 4-/5 bilateral deltoids, 4/5 elbow flexion, 4-/5 elbow extension, 4/5 finger extension, 5/5 finger flexion, 3/5 right hip flexion, 2/5 left hip flexion, 4/5 knee extension bilaterally, 4/5 knee flexion bilaterally, 4/5 foot dorsi flexion, 5/5 foot plantar flexion (does require significant coaching) Reflexes:  From prior testing: Brisk 3+ bilateral biceps, brachioradialis, patellars, no ankle clonus  Sensory:  Patient reports touch but cannot reliably report q-tip soft tip vs. sharp point anywhere on his body. On prior testing proprioception at the great toe was similiarly unreliable   Pertinent Labs and diagnostics:  General labs  Cr stable 8/28 at 1.03  Resolved hypernatremia (145 on 8/28) Magnesium low today at 1.5 Hypocalcemia corrects to 9.2 when accounting for hypoalbuminemia   Muscle enzymes  CK was 6 in 11/2018, then 221 08/03/2019, 45 on 08/29/2019 Aldolase 9.6 AST/ALT normal on 8/25  Infectious/Inflammatory markers ESR elevated to 65 --> 83 on 8/28 CRP 2.6 (ref range < 1.0) LDH 200 (ref range 98-192) Anti-SM neg, SSB neg, SSA > 8.0, ANA Positive  HIV resulted negative  B12 low normal in 300s but MMA normal on 8/24 at 226 Folate 8.3  MRI Abdomen 8/25, personally reviewed, agree with radiology read IMPRESSION: 1. Stable appearance of calcified lesion arising from the medial cortex of the inferior pole of the right kidney. No definite solid enhancing component noted. Finding is favored to represent a benign process. Consider follow-up imaging in 12 months with repeat CT or MRI without and with contrast material. 2. Small to moderate  volume of ascites, mesenteric edema and diffuse body wall edema compatible with anasarca. 3.  Moderate bilateral pleural effusions with overlying compressive type atelectasis. 4. Small hiatal hernia. 5. Sludge and/or stones identified layering within the gallbladder. No signs of gallbladder wall inflammation or biliary ductal dilatation. 6.  Aortic Atherosclerosis (ICD10-I70.0).  MRI brain and C-spine personally reviewed 8/24    Agree with radiology impression: MRI HEAD IMPRESSION: 1. No acute intracranial abnormality or structural findings to explain patient's symptoms identified. 2. Prominent bilateral mastoid and middle ear effusions, of uncertain clinical significance. Correlation with physical exam and any potential symptomatology recommended. 3. 5 mm T1 hyperintense lesion within the pituitary gland, indeterminate. No further imaging evaluation or follow-up is necessary. Consider endocrine function tests and correlate for history of pituitary hypersecretion. This follows ACR consensus guidelines: Management of Incidental Pituitary Findings on CT, MRI and F18-FDG PET: A White Paper of the ACR Incidental Findings Committee. J Am Coll Radiol 2018; 15: 710-62.  MRI CERVICAL SPINE IMPRESSION: 1. Diffuse edema and enhancement involving the left greater than right upper posterior paraspinous musculature. Finding is indeterminate, and could reflect sequelae of muscular injury/strain. Acute myositis could also be considered, which could be either infectious or inflammatory in nature. No loculated collections. 2. No other acute abnormality within the cervical spine. No cord signal changes to suggest myelopathy. 3. Multilevel cervical spondylosis with resultant mild to moderate diffuse spinal stenosis at C3-4 through C6-7, most pronounced at C5-6. Associated moderate to severe bilateral foraminal narrowing at C3 through C7 as above.  8/4 CT angio chest abdomen pelvis, which did reveal a renal cyst for which renal protocol MRI at 6 months, 12 months and then yearly was  recommended;  11/22/2018 CTA chest also personally reviewed and do not see clear evidence of malignancy, most likely changes due to Covid pneumonia he was diagnosed with at the time.   Impression: This is an 86 year old gentleman who is previously fairly healthy and has had a complicated course recently with multiple hospitalizations, GI bleed, C. Diff infection and aspiration pneumonia.   His general examination is notable for diffuse atrophy, lower extremity edema, and a resolving rash on his trunk and arms with some possible nailbed changes on his toes.  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  Additionally he the aforementioned muscular atrophy as well as a pattern of proximal greater than distal weakness suggestive of a myopathy or myositis.  He does not have features suggestive of myasthenia gravis such as fatigability, diplopia.  He does have some features suggestive of an autoimmune process such as positive ANA and SSB.  There is always a possibility of an underlying paraneoplastic process, for which and imaging to date (CT chest November 2020, abdomen pelvis August 4, renal MRI 8/26) did not show clear malignancy.  However there is concern for possible smudge cells on his CBC with concern for CLL, which can be associated with paraneoplastic neurological processes.   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 I would like to proceed with IVIG and steroid treatment, but given the diagnostic workup for hematological malignancy can be muddied by prior steroid treatment, appreciate oncology involvement and recommendations.    Recommendations:  #Generalized weakness, including dysphagia - Please hold statin pending further workup  (HMG-CoA Reductase antibody result) >Pending labs/tests below have been  ordered by myself -anti-HMGCR Ab ([facility] send out 2-3 weeks return time),  -myositis panel ([facility] send out 2-3 weeks return time) -muscle biopsy to be pursued here next week; glutaraldehyde ordered from [place] -plan for IVIG and steroids after heme-onc feels all necessary workup for hematological malignancy complete  >Additional support on an outpatient basis - Rheumatology evaluation - EMG/NCS   # Possible CLL or other malignancy  -CT chest malignancy screening to complete cancer screening -Appreciate peripheral flow cyto and oncology support   # Infections, resolving - s/p ampicillin and metronidazole 8/19 - 8/24 (initially got cefepime and IV vanc as well as aztreonam) for aspiration PNA - continues on oral vancomycin 8/21 - present for C. Diff   #Pituitary incidentaloma -Will obtain LH, FSH, IGF-1, ACTH (added on to prior labs) and 24 hr urine cortisol -Outpatient PCP or endocrine follow-up  # Hypernatremia, resolved  - Stable, likely secondary to poor intake while NPO, and resolved w/ tube feeds  # Nutrition  - Patient amenable to PEG tube placement with hopes of potential recovery on the time frame of months, should he have a treatable inflammatory myopathy; possibility of further complication or myopathic process that is not treatable was discussed with patient and family  Lesleigh Noe MD-PhD [facility] [phone]   [facility] coverage for [facility] is from 7 AM to 8 AM by telephone only (no video visit capability at this time), 8 AM to 4 PM in-house, and 4 PM to 8 PM again by telephone only. Please do not hesitate to reach out to me with any questions.

## 2019-09-03 LAB — CBC
HCT: 30.6 % — ABNORMAL LOW (ref 39.0–52.0)
Hemoglobin: 9.7 g/dL — ABNORMAL LOW (ref 13.0–17.0)
MCH: 28.4 pg (ref 26.0–34.0)
MCHC: 31.7 g/dL (ref 30.0–36.0)
MCV: 89.7 fL (ref 80.0–100.0)
Platelets: 208 10*3/uL (ref 150–400)
RBC: 3.41 MIL/uL — ABNORMAL LOW (ref 4.22–5.81)
RDW: 18.6 % — ABNORMAL HIGH (ref 11.5–15.5)
WBC: 11.4 10*3/uL — ABNORMAL HIGH (ref 4.0–10.5)
nRBC: 0.2 % (ref 0.0–0.2)

## 2019-09-03 LAB — BASIC METABOLIC PANEL
Anion gap: 5 (ref 5–15)
BUN: 20 mg/dL (ref 8–23)
CO2: 21 mmol/L — ABNORMAL LOW (ref 22–32)
Calcium: 7.3 mg/dL — ABNORMAL LOW (ref 8.9–10.3)
Chloride: 116 mmol/L — ABNORMAL HIGH (ref 98–111)
Creatinine, Ser: 0.8 mg/dL (ref 0.61–1.24)
GFR calc Af Amer: >60 mL/min (ref >60)
GFR calc non Af Amer: >60 mL/min (ref >60)
Glucose, Bld: 103 mg/dL — ABNORMAL HIGH (ref 70–99)
Potassium: 4.5 mmol/L (ref 3.5–5.1)
Sodium: 142 mmol/L (ref 135–145)

## 2019-09-03 LAB — MAGNESIUM: Magnesium: 2.1 mg/dL (ref 1.7–2.4)

## 2019-09-03 LAB — FSH/LH
FSH: 6.2 m[IU]/mL (ref 1.5–12.4)
LH: 6.1 m[IU]/mL (ref 1.7–8.6)

## 2019-09-03 LAB — PROLACTIN: Prolactin: 8.8 ng/mL (ref 4.0–15.2)

## 2019-09-03 NOTE — Progress Notes (Signed)
PROGRESS NOTE    Albert [name]  IRJ:[id number] DOB: Oct 20, 1933 DOA: 08/24/2019   PCP: [name], MD    Brief Narrative: Albert [name] is 86 y.o. male with PMH of CKD, HTN, HLD with recent admission for GI bleed and urosepsis presents from SNF with concerns for fever and hypotensive. Patient endorses cough for 3-4 days with sputum production. Also reports 2-3 weeks of dysuria. Denies urinary urgency or frequency. Denies further melena or blood in stool. No hematemesis or hemoptysis.  Overall presentation is most consistent with acute infection/sepsis, either recurrent UTI/cystitis, versus possible pulmonary etiology including COVID-19 or bacterial pneumonia. I have a lower suspicion for his abnormal vital signs being related to GI bleed given the lack of any hematemesis, recent melena, or other specific symptoms to suggest GI etiology. He is admitted for sepsis secondary to possible aspiration pneumonia and C. difficile colitis.  He is getting antibiotics.  Given dysphagia he underwent thorough evaluation,  neurology thinks it secondary to autoimmune myopathies but there is concern about myositis or dermatomyositis,  Neurology ordered multiple labs, recommended close rheumatology Out patient follow-up.  Patient underwent  upper esophagoscopy with upper esophageal sphincter dilatation.  Patient underwent modified barium swallow and had failed, palliative care consulted to discuss goals of care,  Patient want full scope of care.  Patient is very high risk for aspiration Patient has a NG tube placed by IR.  Plan: Patient will require muscle biopsy for confirmation of diagnosis.  Patient will need PEG tube for nutrition,  He is found to have small PE on CT chest,  Venous duplex negative for DVT. Vascular  Surgery consulted for IVC filter, recommended to wait for IVC given recent hx. Of GI bleeding. Patient needs thoracocentesis for pleural effusion , cytology for diagnosis.  Assessment & Plan:     Active Problems:   Elevated troponin   Benign essential hypertension   CKD (chronic kidney disease), stage III   Sepsis (HCC)   Lactic acidosis   Anemia   Elevated brain natriuretic peptide (BNP) level   Aspiration pneumonia of right lower lobe due to gastric secretions (HCC)   Dysphagia   C. difficile colitis   Ulcer of esophagus without bleeding   Hypomagnesemia   Hypokalemia   Advanced care planning/counseling discussion   Goals of care, counseling/discussion   Autoimmune disease ([place])   Palliative care by specialist   1. Sepsis, present on admission with hypotension.  This has improved.  Completed treatment for Aspiration pneumonia with Unasyn (day 5 of 5),          C. difficile colitis on vancomycin via NG tube now.  DC IV Flagyl.  2.  C.difficile colitis.  Vancomycin via NG tube.  3. Dysphagia with failed swallow evaluation with aspiration on esophagram and modified barium swallow.       Neurology did an NIF which was better with second time that they did it.  They ordered a MRI of the brain and cervical spine to rule out any other etiology of      trouble swallowing.     MRI : Findings may be concerning for myositis or muscular injury.  Recommended labs and close outpatient rheumatology follow-up.    ENT; Patient underwent rigid esophagoscopy with dilatation of the stricture.  Tolerated well.  Interventional radiology can potentially do a PEG feeding tube after    completion of treatment for C. difficile colitis.  Currently has NG tube. Can either resume  tube feeding or trial of  swallowing after EUS dilation.     Patient failed modified barium swallow, speech recommended n.p.o. alternate ways of feeding.  Palliative consulted to discuss goals of care.  Plan:  Patient needs PEG tube after completion of treatment for C. difficile.  Continue NG tube for now.            Patient will need muscular biopsy for confirmation of diagnosis, patient will need PEG tube on Monday.  4.   Bilateral pleural effusion: Patient will get thoracocentesis by IR on Monday and possible cytology for diagnosis.  5.  Small PE: Patient is found to have small PE on CT chest, venous duplex negative,  vascular Surgery consulted for IVC placement.         Recommended to hold on IVC given history of GI bleed.  4. Hyperlipidemia.  Hold atorvastatin.  5. Hypomagnesemia >>> Improved.  this was replaced,  recheck electrolytes tomorrow.  6. Hypokalemia  >>> Improved.  replace potassium as needed  and IV fluids.  7. Chronic kidney disease stage II: Renal functions back to base line.   8. Iron deficiency anemia with prior blood loss, esophageal ulcer on recent endoscopy.  Hemoglobin on the lower side but stable.  9. Smudge cells seen on CBC.  Will need referral to hematology as outpatient for likely underlying CLL.  10. Esophageal ulcer and esophagitis on Protonix.   DVT prophylaxis: Lovenox Code Status:  Full Family Communication: No one at bed side, D/W patient. Disposition Plan: Dispo: The patient is from: Rehab  Anticipated d/c is to: Rehab  Anticipated d/c date is: Yet to be determined secondary the patient will have to have some way of getting nutrition  Patient currently being treated for aspiration pneumonia and C. difficile colitis.  Also failed swallow evaluation.              Palliative care consulted to discuss goals of care, patient wants full scope of treatment.              Plan : Family meeting completed, patient wants full scope of treatment.  Scheduled for PEG tube on Monday. Patient is not medically clear given ongoing work-up.  Consultants:    ENT, Neurology, cardiology  Procedures:    Antimicrobials:  Anti-infectives (From admission, onward)   Start     Dose/Rate Route Frequency Ordered Stop   08/29/19 1800  vancomycin (VANCOCIN) 50 mg/mL oral solution 125 mg        125 mg Per Tube 4 times daily 08/29/19 1641 09/08/19 1759    08/28/19 1500  vancomycin (VANCOCIN) 500 mg in sodium chloride irrigation 0.9 % 100 mL ENEMA  Status:  Discontinued        500 mg Rectal Every 6 hours 08/28/19 1340 08/29/19 1641   08/28/19 1500  metroNIDAZOLE (FLAGYL) IVPB 500 mg  Status:  Discontinued        500 mg 100 mL/hr over 60 Minutes Intravenous Every 8 hours 08/28/19 1341 08/29/19 1642   08/26/19 1400  vancomycin (VANCOCIN) 50 mg/mL oral solution 125 mg  Status:  Discontinued        125 mg Oral 4 times daily 08/26/19 1255 08/28/19 1340   08/25/19 2000  vancomycin (VANCOCIN) IVPB 1000 mg/200 mL premix  Status:  Discontinued        1,000 mg 200 mL/hr over 60 Minutes Intravenous Every 24 hours 08/25/19 0240 08/25/19 1440   08/25/19 2000  doxycycline (VIBRA-TABS) tablet 100 mg  Status:  Discontinued  100 mg Oral Every 12 hours 08/25/19 1440 08/26/19 1254   08/25/19 1800  Ampicillin-Sulbactam (UNASYN) 3 g in sodium chloride 0.9 % 100 mL IVPB        3 g 200 mL/hr over 30 Minutes Intravenous Every 6 hours 08/25/19 1605 08/29/19 2336   08/25/19 1000  ceFEPIme (MAXIPIME) 2 g in sodium chloride 0.9 % 100 mL IVPB  Status:  Discontinued        2 g 200 mL/hr over 30 Minutes Intravenous Every 12 hours 08/25/19 0731 08/25/19 1548   08/25/19 0900  aztreonam (AZACTAM) 1 g in sodium chloride 0.9 % 100 mL IVPB  Status:  Discontinued        1 g 200 mL/hr over 30 Minutes Intravenous Every 8 hours 08/25/19 0228 08/25/19 0729   08/25/19 0015  aztreonam (AZACTAM) 2 g in sodium chloride 0.9 % 100 mL IVPB        2 g 200 mL/hr over 30 Minutes Intravenous  Once 08/25/19 0014 08/25/19 0115   08/25/19 0015  metroNIDAZOLE (FLAGYL) IVPB 500 mg  Status:  Discontinued        500 mg 100 mL/hr over 60 Minutes Intravenous Every 8 hours 08/25/19 0014 08/25/19 1548   08/25/19 0015  vancomycin (VANCOCIN) IVPB 1000 mg/200 mL premix        1,000 mg 200 mL/hr over 60 Minutes Intravenous  Once 08/25/19 0014 08/25/19 0222   08/24/19 2145  ceFEPIme (MAXIPIME) 2 g in  sodium chloride 0.9 % 100 mL IVPB        2 g 200 mL/hr over 30 Minutes Intravenous  Once 08/24/19 2134 08/24/19 2222   08/24/19 2145  vancomycin (VANCOCIN) IVPB 1000 mg/200 mL premix        1,000 mg 200 mL/hr over 60 Minutes Intravenous  Once 08/24/19 2134 08/24/19 2251     Subjective: Patient was seen and examined at bedside, Overnight events noted.  Patient denies any pain.   Explained to the patient that he is going to have PEG tube and thoracocentesis on Monday. Objective: Vitals:   09/02/19 2223 09/03/19 0548 09/03/19 0801 09/03/19 1230  BP: 114/70 111/60 127/75 124/60  Pulse:   97 91  Resp:  18 19 20   Temp: 97.8 F (36.6 C) 97.6 F (36.4 C) 97.7 F (36.5 C) 97.6 F (36.4 C)  TempSrc: Oral Axillary Oral Oral  SpO2: 96% 98% 97% 97%  Weight:  78.7 kg    Height:        Intake/Output Summary (Last 24 hours) at 09/03/2019 1334 Last data filed at 09/03/2019 1324 Gross per 24 hour  Intake 3432.85 ml  Output 800 ml  Net 2632.85 ml   Filed Weights   09/01/19 0400 09/02/19 0537 09/03/19 0548  Weight: 74.3 kg 73 kg 78.7 kg    Examination:  General exam: Appears calm and comfortable , NG tube in Rt nostril Respiratory system: Clear to auscultation. Respiratory effort normal. Cardiovascular system: S1 & S2 heard, RRR. No JVD, murmurs, rubs, gallops or clicks. No pedal edema. Gastrointestinal system: Abdomen is nondistended, soft and nontender. No organomegaly or masses felt. Normal bowel sounds heard. Central nervous system: Alert and oriented. No focal neurological deficits. Extremities: No leg edema, no cyanosis. Skin: No rashes, lesions or ulcers Psychiatry: Judgement and insight appear normal. Mood & affect appropriate.     Data Reviewed: I have personally reviewed following labs and imaging studies  CBC: Recent Labs  Lab 08/28/19 0416 08/31/19 0604 09/01/19 0526 09/02/19 0642 09/03/19 0600  WBC 9.5 7.8 8.0 8.2 11.4*  HGB 8.3* 9.4* 8.9* 9.3* 9.7*  HCT 27.0*  29.4* 29.2* 29.7* 30.6*  MCV 91.8 90.2 93.0 91.4 89.7  PLT 214 227 216 189 979   Basic Metabolic Panel: Recent Labs  Lab 08/30/19 1403 08/31/19 0604 09/01/19 0526 09/02/19 0642 09/03/19 0600 09/03/19 0841  NA 152* 151* 151* 145 142  --   K 3.7 3.8 4.0 4.4 4.5  --   CL 122* 122* 122* 120* 116*  --   CO2 22 19* 21* 18* 21*  --   GLUCOSE 112* 86 141* 130* 103*  --   BUN 17 19 18 21 20   --   CREATININE 1.00 1.14 1.06 1.03 0.80  --   CALCIUM 7.3* 7.6* 7.7* 7.4* 7.3*  --   MG 1.5* 1.8 1.7 1.5*  --  2.1  PHOS  --  3.7 3.4 3.1  --   --    GFR: Estimated Creatinine Clearance: 65.1 mL/min (by C-G formula based on SCr of 0.8 mg/dL). Liver Function Tests: Recent Labs  Lab 08/30/19 1403  AST 36  ALT 14  ALKPHOS 58  BILITOT 0.8  PROT 5.0*  ALBUMIN 1.8*   No results for input(s): LIPASE, AMYLASE in the last 168 hours. No results for input(s): AMMONIA in the last 168 hours. Coagulation Profile: No results for input(s): INR, PROTIME in the last 168 hours. Cardiac Enzymes: Recent Labs  Lab 08/29/19 1338  CKTOTAL 45*   BNP (last 3 results) No results for input(s): PROBNP in the last 8760 hours. HbA1C: No results for input(s): HGBA1C in the last 72 hours. CBG: No results for input(s): GLUCAP in the last 168 hours. Lipid Profile: No results for input(s): CHOL, HDL, LDLCALC, TRIG, CHOLHDL, LDLDIRECT in the last 72 hours. Thyroid Function Tests: No results for input(s): TSH, T4TOTAL, FREET4, T3FREE, THYROIDAB in the last 72 hours. Anemia Panel: Recent Labs    09/02/19 0642  FOLATE 8.3  RETICCTPCT 2.1   Sepsis Labs: No results for input(s): PROCALCITON, LATICACIDVEN in the last 168 hours.  Recent Results (from the past 240 hour(s))  Urine culture     Status: None   Collection Time: 08/24/19  8:37 PM   Specimen: In/Out Cath Urine  Result Value Ref Range Status   Specimen Description   Final    IN/OUT CATH URINE Performed at [facility], [address], [place], [place] [address]    Special Requests   Final    NONE Performed at [facility], [address] [place], [place] [address]    Culture   Final    NO GROWTH Performed at [facility], [place] [address] [place], [place] [address]    Report Status 08/26/2019 FINAL  Final  SARS Coronavirus 2 by RT PCR (hospital order, performed in [facility] hospital lab) Nasopharyngeal Nasopharyngeal Swab     Status: None   Collection Time: 08/24/19  8:53 PM   Specimen: Nasopharyngeal Swab  Result Value Ref Range Status   SARS Coronavirus 2 NEGATIVE NEGATIVE Final    Comment: (NOTE) SARS-CoV-2 target nucleic acids are NOT DETECTED.  The SARS-CoV-2 RNA is generally detectable in upper and lower respiratory specimens during the acute phase of infection. The lowest concentration of SARS-CoV-2 viral copies this assay can detect is 250 copies / mL. A negative result does not preclude SARS-CoV-2 infection and should not be used as the sole basis for treatment or other patient management decisions.  A negative result  may occur with improper specimen collection / handling, submission of specimen other than nasopharyngeal swab, presence of viral mutation(s) within the areas targeted by this assay, and inadequate number of viral copies (<250 copies / mL). A negative result must be combined with clinical observations, patient history, and epidemiological information.  Fact Sheet for Patients:   StrictlyIdeas.no  Fact Sheet for Healthcare Providers: BankingDealers.co.za  This test is not yet approved or  cleared by the [place] FDA and has been authorized for detection and/or diagnosis of SARS-CoV-2 by FDA under an Emergency Use Authorization (EUA).  This EUA will remain in effect (meaning this test can be used) for the duration of the COVID-19 declaration under Section 564(b)(1) of the Act, 21 U.S.C. section 360bbb-3(b)(1), unless  the authorization is terminated or revoked sooner.  Performed at [facility], [address] [place], [place] [address]   Blood Culture (routine x 2)     Status: None   Collection Time: 08/24/19  9:00 PM   Specimen: BLOOD  Result Value Ref Range Status   Specimen Description BLOOD BLOOD LEFT FOREARM  Final   Special Requests   Final    BOTTLES DRAWN AEROBIC AND ANAEROBIC Blood Culture results may not be optimal due to an excessive volume of blood received in culture bottles   Culture   Final    NO GROWTH 5 DAYS Performed at [facility], [address] [place], [place] [address]    Report Status 08/29/2019 FINAL  Final  Blood Culture (routine x 2)     Status: None   Collection Time: 08/24/19  9:05 PM   Specimen: BLOOD  Result Value Ref Range Status   Specimen Description BLOOD BLOOD LEFT HAND  Final   Special Requests   Final    BOTTLES DRAWN AEROBIC AND ANAEROBIC Blood Culture adequate volume   Culture   Final    NO GROWTH 5 DAYS Performed at [facility], [address] [place], [place] [address]    Report Status 08/29/2019 FINAL  Final  MRSA PCR Screening     Status: None   Collection Time: 08/25/19 10:33 PM   Specimen: Nasopharyngeal  Result Value Ref Range Status   MRSA by PCR NEGATIVE NEGATIVE Final    Comment:        The GeneXpert MRSA Assay (FDA approved for NASAL specimens only), is one component of a comprehensive MRSA colonization surveillance program. It is not intended to diagnose MRSA infection nor to guide or monitor treatment for MRSA infections. Performed at [facility], [place], [place] [address]   C Difficile Quick Screen w PCR reflex     Status: Abnormal   Collection Time: 08/26/19 10:22 AM   Specimen: STOOL  Result Value Ref Range Status   C Diff antigen POSITIVE (A) NEGATIVE Final   C Diff toxin POSITIVE (A) NEGATIVE Final   C Diff interpretation Toxin producing C. difficile  detected.  Final    Comment: CRITICAL RESULT CALLED TO, READ BACK BY AND VERIFIED WITH: ANNIE RITCHIE AT 1128 08/26/19.PMF Performed at [facility], [address] [place], [place] [address]      Radiology Studies: CT CHEST W CONTRAST  Result Date: 09/02/2019 CLINICAL DATA:  Lung cancer screening, complicated hospital course, multiple hospitalizations, GI bleed, C difficile infection, aspiration pneumonia, question CLL on CBC, pulmonary infiltrates on chest radiographs EXAM: CT CHEST WITH CONTRAST TECHNIQUE: Multidetector CT imaging of the chest was performed during intravenous contrast administration.  Sagittal and coronal MPR images reconstructed from axial data set. CONTRAST:  15mL OMNIPAQUE IOHEXOL 300 MG/ML  SOLN IV. COMPARISON:  11/22/2018 Correlation: Chest radiograph 08/24/2019 FINDINGS: Cardiovascular: Atherosclerotic calcifications aorta, proximal great vessels, and coronary arteries. Heart upper normal sized. Mitral annular calcification noted. No pericardial effusion. Upper normal caliber ascending thoracic aorta 3.7 cm diameter. Ulcerated plaque identified aortic arch best appreciated on sagittal series 6, image 90. Small pulmonary embolus in LEFT upper lobe pulmonary arterial branch image 35 series 2. No other definite pulmonary emboli. Mediastinum/Nodes: Feeding tube extends through esophagus into stomach. Base of cervical region normal appearance. Scattered normal sized mediastinal lymph nodes. No definite adenopathy. Lungs/Pleura: Moderate-sized BILATERAL pleural effusions. Significant compressive atelectasis of BILATERAL lower lobes. Mild compressive atelectasis of posterior RIGHT upper lobe. No pulmonary infiltrate or pneumothorax. No definite mass/nodule though lower lobes are inadequately assessed. Upper Abdomen: Beam hardening artifacts from patient's arms. Visualized upper abdomen grossly unremarkable. Musculoskeletal: Thoracic kyphosis. Osseous mineralization. No acute  bony findings. IMPRESSION: Moderate BILATERAL pleural effusions and compressive atelectasis of primarily lower lobes, less RIGHT upper lobe. Small pulmonary embolus in a LEFT upper lobe pulmonary artery branch. Scattered atherosclerotic calcifications including coronary arteries. Aortic Atherosclerosis (ICD10-I70.0). Findings called to [facility] on 2A on 09/02/2019 at 1611 hrs. Electronically Signed   By: Lavonia Dana M.D.   On: 09/02/2019 16:12   US Venous Img Lower Bilateral (DVT)  Result Date: 09/03/2019 CLINICAL DATA:  86 year old with small pulmonary emboli on recent chest CT. EXAM: BILATERAL LOWER EXTREMITY VENOUS DOPPLER ULTRASOUND TECHNIQUE: Gray-scale sonography with graded compression, as well as color Doppler and duplex ultrasound were performed to evaluate the lower extremity deep venous systems from the level of the common femoral vein and including the common femoral, femoral, profunda femoral, popliteal and calf veins including the posterior tibial, peroneal and gastrocnemius veins when visible. The superficial great saphenous vein was also interrogated. Spectral Doppler was utilized to evaluate flow at rest and with distal augmentation maneuvers in the common femoral, femoral and popliteal veins. COMPARISON:  None. FINDINGS: RIGHT LOWER EXTREMITY Common Femoral Vein: No evidence of thrombus. Normal compressibility, respiratory phasicity and response to augmentation. Saphenofemoral Junction: No evidence of thrombus. Normal compressibility and flow on color Doppler imaging. Profunda Femoral Vein: No evidence of thrombus. Normal compressibility and flow on color Doppler imaging. Femoral Vein: No evidence of thrombus. Normal compressibility, respiratory phasicity and response to augmentation. Popliteal Vein: No evidence of thrombus. Normal compressibility, respiratory phasicity and response to augmentation. Calf Veins: No evidence of thrombus. Normal compressibility and flow on color Doppler imaging.  Other Findings:  None. LEFT LOWER EXTREMITY Common Femoral Vein: No evidence of thrombus. Normal compressibility, respiratory phasicity and response to augmentation. Saphenofemoral Junction: No evidence of thrombus. Normal compressibility and flow on color Doppler imaging. Profunda Femoral Vein: No evidence of thrombus. Normal compressibility and flow on color Doppler imaging. Femoral Vein: No evidence of thrombus. Normal compressibility, respiratory phasicity and response to augmentation. Popliteal Vein: No evidence of thrombus. Normal compressibility, respiratory phasicity and response to augmentation. Calf Veins: No evidence of thrombus. Normal compressibility and flow on color Doppler imaging. Other Findings: Extensive edema in the lower extremities and difficult to completely compress many of the vessels. IMPRESSION: No evidence of deep venous thrombosis in either lower extremity. Electronically Signed   By: Markus Daft M.D.   On: 09/03/2019 09:01    Scheduled Meds: . enoxaparin (LOVENOX) injection  40 mg Subcutaneous Q24H  . feeding supplement (PROSource TF)  45 mL Per  Tube Daily  . free water  100 mL Per Tube Q4H  . iohexol  500 mL Per Tube Q1H  . metoprolol tartrate  2.5 mg Intravenous Q12H  . pantoprazole (PROTONIX) IV  40 mg Intravenous Q12H  . vancomycin  125 mg Per Tube QID   Continuous Infusions: . sodium chloride 250 mL (08/29/19 0621)  . dextrose 5 % and 0.45 % NaCl with KCl 20 mEq/L 50 mL/hr at 09/03/19 1155  . feeding supplement (OSMOLITE 1.5 CAL) 1,000 mL (09/03/19 1015)     LOS: 9 days    Time spent: 35 mins.    Shawna Clamp, MD [facility]   If 7PM-7AM, please contact night-coverage

## 2019-09-03 NOTE — Progress Notes (Signed)
[facility] Hematology/Oncology Progress Note  Date of admission: 08/24/2019  Hospital day:  09/03/2019  Chief Complaint: CHING RABIDEAU is a 86 y.o. male with chronic kidney disease, and hypertension who was admitted through the emergency room with fever and hypotension.  Subjective: Patient with dysphagia and NG feeding in place.  Social History: The patient is accompanied by Dr Curly Shores today.  Allergies: No Active Allergies  Scheduled Medications: . enoxaparin (LOVENOX) injection  40 mg Subcutaneous Q24H  . feeding supplement (PROSource TF)  45 mL Per Tube Daily  . free water  100 mL Per Tube Q4H  . iohexol  500 mL Per Tube Q1H  . metoprolol tartrate  2.5 mg Intravenous Q12H  . pantoprazole (PROTONIX) IV  40 mg Intravenous Q12H  . vancomycin  125 mg Per Tube QID    Review of Systems: GENERAL:  Fatigue.  No fevers, sweats or weight loss. PERFORMANCE STATUS (ECOG):  2-3 HEENT:  No visual changes, runny nose, sore throat, mouth sores or tenderness. Lungs: No shortness of breath or cough.  No hemoptysis. Cardiac:  No chest pain, palpitations, orthopnea, or PND. GI:  Difficulty swallowing.  C difficile diarrhea. No nausea, vomiting, constipation, melena or hematochezia. GU:  No urgency, frequency, dysuria, or hematuria. Musculoskeletal:  Generalized weakness.  No back pain.  No joint pain.  No muscle tenderness. Extremities:  No pain or swelling. Skin:  No rashes or skin changes. Neuro:  No headache, numbness or weakness, balance or coordination issues. Endocrine:  No diabetes, thyroid issues, hot flashes or night sweats. Psych:  No mood changes, depression or anxiety. Pain:  No focal pain. Review of systems:  All other systems reviewed and found to be negative.  Physical Exam: Blood pressure 127/75, pulse 97, temperature 97.7 F (36.5 C), temperature source Oral, resp. rate 19, height 5\' 4"  (1.626 m), weight 173 lb 8 oz (78.7 kg), SpO2 97 %.  GENERAL:  Chronically fatigued appearing gentleman lying comfortably on the medical unit in no acute distress. MENTAL STATUS:  Alert and oriented to person, place and time. HEAD: Short brown hair hair.  Normocephalic, atraumatic, face symmetric, no Cushingoid features. EYES:  Pupils equal round and reactive to light and accomodation.  No conjunctivitis or scleral icterus. ENT: Dobbhoff tube in place oropharynx clear without lesion.  Tongue normal. Mucous membranes moist.  RESPIRATORY:  Clear to auscultation anteriorly with decreased breath sounds at the base.  No rales, wheezes or rhonchi. CARDIOVASCULAR:  Regular rate and rhythm without murmur, rub or gallop. ABDOMEN:  Soft, non-tender, with active bowel sounds, and no hepatosplenomegaly.  No masses. SKIN: Dependent edema.  No rashes, ulcers or lesions. EXTREMITIES: Right upper extremity edema.  Heels with pressure dressing in place.  Tender ower extremity edema. NEUROLOGICAL: Unremarkable. PSYCH:  Appropriate.   Results for orders placed or performed during the hospital encounter of 08/24/19 (from the past 48 hour(s))  CBC     Status: Abnormal   Collection Time: 09/02/19  6:42 AM  Result Value Ref Range   WBC 8.2 4.0 - 10.5 K/uL   RBC 3.25 (L) 4.22 - 5.81 MIL/uL   Hemoglobin 9.3 (L) 13.0 - 17.0 g/dL   HCT 29.7 (L) 39 - 52 %   MCV 91.4 80.0 - 100.0 fL   MCH 28.6 26.0 - 34.0 pg   MCHC 31.3 30.0 - 36.0 g/dL   RDW 18.4 (H) 11.5 - 15.5 %   Platelets 189 150 - 400 K/uL   nRBC 0.2 0.0 -  0.2 %    Comment: Performed at [facility], [address] [place], [place] [address]  Basic metabolic panel     Status: Abnormal   Collection Time: 09/02/19  6:42 AM  Result Value Ref Range   Sodium 145 135 - 145 mmol/L   Potassium 4.4 3.5 - 5.1 mmol/L   Chloride 120 (H) 98 - 111 mmol/L   CO2 18 (L) 22 - 32 mmol/L   Glucose, Bld 130 (H) 70 - 99 mg/dL    Comment: Glucose reference range applies only to samples taken after fasting for at least 8  hours.   BUN 21 8 - 23 mg/dL   Creatinine, Ser 1.03 0.61 - 1.24 mg/dL   Calcium 7.4 (L) 8.9 - 10.3 mg/dL   GFR calc non Af Amer >60 >60 mL/min   GFR calc Af Amer >60 >60 mL/min   Anion gap 7 5 - 15    Comment: Performed at [facility], [address], [place], [place] [address]  Magnesium     Status: Abnormal   Collection Time: 09/02/19  6:42 AM  Result Value Ref Range   Magnesium 1.5 (L) 1.7 - 2.4 mg/dL    Comment: Performed at [facility], [address], [place], [place] [address]  Phosphorus     Status: None   Collection Time: 09/02/19  6:42 AM  Result Value Ref Range   Phosphorus 3.1 2.5 - 4.6 mg/dL    Comment: Performed at [facility], [address] [place], [place] [address]  Reticulocytes     Status: Abnormal   Collection Time: 09/02/19  6:42 AM  Result Value Ref Range   Retic Ct Pct 2.1 0.4 - 3.1 %   RBC. 3.14 (L) 4.22 - 5.81 MIL/uL   Retic Count, Absolute 66.3 19.0 - 186.0 K/uL   Immature Retic Fract 26.1 (H) 2.3 - 15.9 %    Comment: Performed at [facility] [address] [place], [place] [address]  Folate, serum, performed at [facility] lab     Status: None   Collection Time: 09/02/19  6:42 AM  Result Value Ref Range   Folate 8.3 >5.9 ng/mL    Comment: Performed at [facility], [address] [place], [place] [address]  Sedimentation rate     Status: Abnormal   Collection Time: 09/02/19  6:42 AM  Result Value Ref Range   Sed Rate 83 (H) 0 - 20 mm/hr    Comment: Performed at [facility], [address] [place], [place] [address]  Cortisol-am, blood     Status: None   Collection Time: 09/02/19 10:09 AM  Result Value Ref Range   Cortisol - AM 16.2 6.7 - 22.6 ug/dL    Comment: Performed at [facility], [place] [address], [place], [place] [address]  CBC     Status: Abnormal   Collection Time: 09/03/19  6:00 AM  Result Value Ref Range   WBC 11.4 (H) 4.0 - 10.5 K/uL   RBC 3.41 (L) 4.22 -  5.81 MIL/uL   Hemoglobin 9.7 (L) 13.0 - 17.0 g/dL   HCT 30.6 (L) 39 - 52 %   MCV 89.7 80.0 - 100.0 fL   MCH 28.4 26.0 - 34.0 pg   MCHC 31.7 30.0 - 36.0 g/dL   RDW 18.6 (H) 11.5 - 15.5 %   Platelets 208 150 - 400 K/uL   nRBC 0.2 0.0 - 0.2 %    Comment: Performed at  [facility], [address] [place], [place] [address]  Basic metabolic panel     Status: Abnormal   Collection Time: 09/03/19  6:00 AM  Result Value Ref Range   Sodium 142 135 - 145 mmol/L   Potassium 4.5 3.5 - 5.1 mmol/L   Chloride 116 (H) 98 - 111 mmol/L   CO2 21 (L) 22 - 32 mmol/L   Glucose, Bld 103 (H) 70 - 99 mg/dL    Comment: Glucose reference range applies only to samples taken after fasting for at least 8 hours.   BUN 20 8 - 23 mg/dL   Creatinine, Ser 0.80 0.61 - 1.24 mg/dL   Calcium 7.3 (L) 8.9 - 10.3 mg/dL   GFR calc non Af Amer >60 >60 mL/min   GFR calc Af Amer >60 >60 mL/min   Anion gap 5 5 - 15    Comment: Performed at [facility], [address] [place], [place] [address]  Magnesium     Status: None   Collection Time: 09/03/19  8:41 AM  Result Value Ref Range   Magnesium 2.1 1.7 - 2.4 mg/dL    Comment: Performed at [facility], [address] [place], [place] [address]   CT CHEST W CONTRAST  Result Date: 09/02/2019 CLINICAL DATA:  Lung cancer screening, complicated hospital course, multiple hospitalizations, GI bleed, C difficile infection, aspiration pneumonia, question CLL on CBC, pulmonary infiltrates on chest radiographs EXAM: CT CHEST WITH CONTRAST TECHNIQUE: Multidetector CT imaging of the chest was performed during intravenous contrast administration. Sagittal and coronal MPR images reconstructed from axial data set. CONTRAST:  48mL OMNIPAQUE IOHEXOL 300 MG/ML  SOLN IV. COMPARISON:  11/22/2018 Correlation: Chest radiograph 08/24/2019 FINDINGS: Cardiovascular: Atherosclerotic calcifications aorta, proximal great vessels, and coronary arteries. Heart upper normal  sized. Mitral annular calcification noted. No pericardial effusion. Upper normal caliber ascending thoracic aorta 3.7 cm diameter. Ulcerated plaque identified aortic arch best appreciated on sagittal series 6, image 90. Small pulmonary embolus in LEFT upper lobe pulmonary arterial branch image 35 series 2. No other definite pulmonary emboli. Mediastinum/Nodes: Feeding tube extends through esophagus into stomach. Base of cervical region normal appearance. Scattered normal sized mediastinal lymph nodes. No definite adenopathy. Lungs/Pleura: Moderate-sized BILATERAL pleural effusions. Significant compressive atelectasis of BILATERAL lower lobes. Mild compressive atelectasis of posterior RIGHT upper lobe. No pulmonary infiltrate or pneumothorax. No definite mass/nodule though lower lobes are inadequately assessed. Upper Abdomen: Beam hardening artifacts from patient's arms. Visualized upper abdomen grossly unremarkable. Musculoskeletal: Thoracic kyphosis. Osseous mineralization. No acute bony findings. IMPRESSION: Moderate BILATERAL pleural effusions and compressive atelectasis of primarily lower lobes, less RIGHT upper lobe. Small pulmonary embolus in a LEFT upper lobe pulmonary artery branch. Scattered atherosclerotic calcifications including coronary arteries. Aortic Atherosclerosis (ICD10-I70.0). Findings called to [facility] on 2A on 09/02/2019 at 1611 hrs. Electronically Signed   By: Lavonia Dana M.D.   On: 09/02/2019 16:12   US Venous Img Lower Bilateral (DVT)  Result Date: 09/03/2019 CLINICAL DATA:  86 year old with small pulmonary emboli on recent chest CT. EXAM: BILATERAL LOWER EXTREMITY VENOUS DOPPLER ULTRASOUND TECHNIQUE: Gray-scale sonography with graded compression, as well as color Doppler and duplex ultrasound were performed to evaluate the lower extremity deep venous systems from the level of the common femoral vein and including the common femoral, femoral, profunda femoral, popliteal and calf veins  including the posterior tibial, peroneal and gastrocnemius veins when visible. The superficial great saphenous vein was also interrogated. Spectral Doppler was utilized to evaluate flow at rest  and with distal augmentation maneuvers in the common femoral, femoral and popliteal veins. COMPARISON:  None. FINDINGS: RIGHT LOWER EXTREMITY Common Femoral Vein: No evidence of thrombus. Normal compressibility, respiratory phasicity and response to augmentation. Saphenofemoral Junction: No evidence of thrombus. Normal compressibility and flow on color Doppler imaging. Profunda Femoral Vein: No evidence of thrombus. Normal compressibility and flow on color Doppler imaging. Femoral Vein: No evidence of thrombus. Normal compressibility, respiratory phasicity and response to augmentation. Popliteal Vein: No evidence of thrombus. Normal compressibility, respiratory phasicity and response to augmentation. Calf Veins: No evidence of thrombus. Normal compressibility and flow on color Doppler imaging. Other Findings:  None. LEFT LOWER EXTREMITY Common Femoral Vein: No evidence of thrombus. Normal compressibility, respiratory phasicity and response to augmentation. Saphenofemoral Junction: No evidence of thrombus. Normal compressibility and flow on color Doppler imaging. Profunda Femoral Vein: No evidence of thrombus. Normal compressibility and flow on color Doppler imaging. Femoral Vein: No evidence of thrombus. Normal compressibility, respiratory phasicity and response to augmentation. Popliteal Vein: No evidence of thrombus. Normal compressibility, respiratory phasicity and response to augmentation. Calf Veins: No evidence of thrombus. Normal compressibility and flow on color Doppler imaging. Other Findings: Extensive edema in the lower extremities and difficult to completely compress many of the vessels. IMPRESSION: No evidence of deep venous thrombosis in either lower extremity. Electronically Signed   By: Markus Daft M.D.   On:  09/03/2019 09:01    Assessment:  Albert [name] is a 86 y.o. male with chronic kidney disease, and hypertension who was admitted through the emergency room with fever and hypotension.  He has a history of upper GI bleeding secondary to esophageal and duodenal ulcers.    He completed 5 days of Unasyn for RLL aspiration pneumonia.  He is on oral vancomycin for C difficile colitis.  He has a history of smudge cells seen on peripheral smear.  CBC on 09/01/2019 reveals a hematocrit of 29.2, hemoglobin 8.9, MCV 93.0, platelets 216,000, WBC 8000.  He has had chronic anemia since 11/2018.  WBC has ranged between 5,000 - 25,000 without trend.  Differential has been predominantly neutrophils.  Platelet count has ranged between 99,000 - 239,000 without trend.  Ferritin was 176 and iron saturation of 9% and a TIBC of 116 on 08/25/2019.  B12 was 300 on 08/26/2019.  Peripheral smear revealed normocytic anemia.  Morphology of RBCs, WBCs and platelets were within normal limits.  There were no evidence of circulating blasts, schistocytes or smudge cells.  Chest CT with contrast on 09/02/2019 revealed moderate bilateral pleural effusions and compressive atelectasis of the primary lobes.  There was a small pulmonary embolus in the left upper lobe pulmonary artery.  Bilateral lower extremity duplex revealed no evidence of a DVT  Colonoscopy on 08/05/2019 revealed a poor colon prep.  There was diverticulosis in the sigmoid colon.  There was blood and stool in the rectum and in the sigmoid colon.   EGD on 08/05/2019 revealed an esophageal ulcer with no stigmata of recent bleeding.  There was LA Grade A reflux esophagitis with no bleeding and a 2 cm hiatal hernia.  There was erythematous mucosa in the antrum and non-bleeding duodenal ulcers with no stigmata of bleeding.  Symptomatically,  he is fatigued.  He has dysphagia and a Dobbhoff feeding tube in place.  Plan:   1.   Smudge cells             Peripheral  smear today reveals no smudge cells.  Differential on 08/24/2019 revealed predominantely leukocytosis with an absolute lymphocyte count (ALC) of 900.             Smudge cells typically indicative of CLL.             Patient denies any h/o recurrent infections.             He denies any B symptoms.  Exam reveals no adenopathy or hepatosplenomegaly.             Flow cytometry is pending. 2.  Normocytic anemia             Patient has a history of GI bleeding.             No current evidence of active bleeding.             Ferritin is falsely elevated (acute phase reactant).             Some component of anemia of chronic disease.             B12 300 (low normal).             Creatinine 1.06 (CrCl 47.8 ml/min).             Retic 2.1%.  Folate 8.3 (normal).  Sed rate 83 (high). 3.   Pulmonary embolism  PE is small.  Likely source was lower extremity.  He is bedridden.  Anticoagulation is contraindicated secondary to recent GI bleeds.  DVT prophylaxis with ICDs.    Consider vascular surgery consult for possible IVC filter. 4.   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  Patient is scheduled for PEG tube placement, muscle biopsy, and thoracentesis to r/o malignancy.  Lequita Asal, MD  09/03/2019, 12:01 PM

## 2019-09-03 NOTE — Plan of Care (Signed)

## 2019-09-03 NOTE — Progress Notes (Addendum)
Neurology Progress Note  Patient ID: Albert [name] is a 86 y.o. male with medical history significant forhypertension, hyperlipidemia,  osteopenia, chronic kidney disease stage IIIa, chronic pain secondary to arthritis, recent GI bleeds, atrial arrhthymias (PACs and PVCs per cardiology, no need for [facility]); neurology was consulted on 8/24 for concern for a neurological cause of his dysphagia  Major interval events/Subjective:  - CTA completed for malignancy workup; revealed PE - LE duplex negative  - continues to deny headache, diplopia, nausea or any other acute complaints  - feels swelling in legs is improving and strength is stable - currently feels like he is having 1-2 BM per day  - reports cough has improved substantially and is minimal at this time - agreeable to PEG, thoracentesis   Exam: Vitals:   09/03/19 0548 09/03/19 0801  BP: 111/60 127/75  Pulse:  97  Resp: 18 19  Temp: 97.6 F (36.4 C) 97.7 F (36.5 C)  SpO2: 98% 97%   Constitutional: Chronically ill-appearing,  Psych: Affect appropriate to situation, pleasant and cooperative, intermittently appropriately frustrated but redirectable  Eyes: No scleral injection HENT: temporal wasting, NG tube in place MSK: arthritic changes most notable in the hands, with severe atrophy of bilateral hands Cardiovascular: Irregularly irregular Respiratory: Effort normal, non-labored breathing Skin: Redness of upper chest and resolving rash on arms, stable; taking on the appearance of livideo reticularis at the edges. Possible crack on fingertips, not clearly meeting description for mechanic's hands or [place]'s papules. Patchy alopecia on the head with patchy dry skin there as well  Extremities: Stable bipedal edema and upper extremity edema and chronic skin changes in lower extremities; some nail bed changes on toes. Apparent fungal infection of toenails.   Neuro: Mental Status: Patient is awake, alert, oriented to person, place, and  situation. Cranial Nerves: II: Pupils are equal, round, and reactive to light. III, IV, VI: EMOI in all directions, no fatigability or ptosis  V: Facial sensation intact to light touch throughout  VII: Facial movement is symmetric.  VIII: hearing is baseline hard of hearing, worse on the right IX, X: Uvula elevates symmetrically  XI: Head turn 5/5 bilaterally (head flexion weak as below) XII: Tongue midline Motor: There is diffuse muscle wasting throughout, including temporal wasting and severe atrophy of the hands. No fasciculations or tremor today.    Stable strength exam: 4/5 head flexion, 4-/5 bilateral deltoids, 4/5 elbow flexion, 4-/5 elbow extension, 4/5 finger extension, 5/5 finger flexion, 3/5 right hip flexion, 2/5 left hip flexion, 4/5 knee extension bilaterally, 4/5 knee flexion bilaterally, 4/5 foot dorsi flexion, 5/5 foot plantar flexion (does require significant coaching), more pain limited today than yesterday (note knee extension and flexion from 8/28, deferred due to pain and overall stability today) Reflexes:  From prior testing: Brisk 3+ bilateral biceps, brachioradialis, patellars, no ankle clonus  Sensory:  Patient reports touch but cannot reliably report q-tip soft tip vs. sharp point anywhere on his body. On prior testing proprioception at the great toe was similiarly unreliable. Have not tested vibration.   Pertinent Labs and diagnostics:  General labs  Cr 8/29 0.8 Resolved hypernatremia (142 on 8/29) Magnesium improved today at 2.1  Hypocalcemia corrects when accounting for hypoalbuminemia   Muscle enzymes  CK was 6 in 11/2018, then 221 08/03/2019, 45 on 08/29/2019 Aldolase 9.6 AST/ALT normal on 8/25  Infectious/Inflammatory markers ESR elevated to 65 --> 83 on 8/28 CRP 2.6 (ref range < 1.0) LDH 200 (ref range 98-192) Anti-SM neg, SSB neg, SSA >  8.0, ANA Positive  HIV resulted negative  B12 low normal in 300s but MMA normal on 8/24 at 226 Folate 8.3   MRI brain and C-spine personally reviewed 8/24, agree with radiology read, key findings below  3. 5 mm T1 hyperintense lesion within the pituitary gland, indeterminate. No further imaging evaluation or follow-up is necessary. Consider endocrine function tests and correlate for history of pituitary hypersecretion. This follows ACR consensus guidelines: Management of Incidental Pituitary Findings on CT, MRI and F18-FDG PET: A White Paper of the ACR Incidental Findings Committee. J Am Coll Radiol 2018; 15: 966-72. 1. Diffuse edema and enhancement involving the left greater than right upper posterior paraspinous musculature. Finding is indeterminate, and could reflect sequelae of muscular injury/strain. Acute myositis could also be considered, which could be either infectious or inflammatory in nature. No loculated collections. 3. Multilevel cervical spondylosis with resultant mild to moderate diffuse spinal stenosis at C3-4 through C6-7, most pronounced at C5-6. Associated moderate to severe bilateral foraminal narrowing at C3 through C7 as above. (no acute myelopathy on imaging given no cord signal change)  8/4 CT angio chest abdomen pelvis, which did reveal a renal cyst for which renal protocol MRI at 6 months, 12 months and then yearly was recommended; MRI Abdomen 8/25, personally reviewed, agree with radiology read 1. Stable appearance of calcified lesion arising from the medial cortex of the inferior pole of the right kidney. No definite solid enhancing component noted. Finding is favored to represent a benign process. Consider follow-up imaging in 12 months with repeat CT or MRI without and with contrast material. 11/22/2018 CTA chest also personally reviewed and do not see clear evidence of malignancy, most likely changes due to Covid pneumonia he was diagnosed with at the time.  8/29 repeat CTA chest, personally reviewed with moderate bilateral pleural effusions and a small PE  Impression: This is  an 85 year old gentleman who is previously fairly healthy and has had a complicated course recently with multiple hospitalizations, GI bleed, C. Diff infection and aspiration pneumonia.   His general examination is notable for diffuse atrophy, lower extremity edema, and a resolving rash on his trunk and arms with some possible nailbed changes on his toes.  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  Additionally he the aforementioned muscular atrophy as well as a pattern of proximal greater than distal weakness suggestive of a myopathy or myositis.  He does not have features suggestive of myasthenia gravis such as fatigability, diplopia.  He does have some features suggestive of an autoimmune process such as positive ANA and SSB and the notable rash and skin changes.  There is always a possibility of an underlying paraneoplastic process; successful treatment of possible paraneoplastic myositis requires an exhaustive malignancy workup as underlying malignancy must be addressed.   Ideally patient would have EMG/NCS (not available inpatient here and ongoing pandemic with current rising cases excludes transfer to academic center in a timely fashion). We will attempt to obtain muscle biopsy (left biceps) next week once glutaraldehyde ordered has arrived from [facility] (specimen to be processed at [facility]). Unfortunately negative muscle enzymes do not rule out an inflammatory muscle process, especially given his advanced weakness and muscle wasting  Once malignancy workup is complete plan to proceed with immunosuppressive treatment for possible inflammatory myopathy; though mitochondrial/genetic myopathy or muscular dystrophies also remain on the differential. IVIG is prothrombotic and contraindicated in the setting of PE, so will not pursue  that treatment at this time. Steroid treatment also somewhat risky given recent infection and  ongoing aspiration but family/patient accept the risk of steroids which have been explicitly discussed with them.    Recommendations:  # Generalized weakness, including dysphagia, stable  - Please hold statin pending further workup (HMG-CoA Reductase antibody result) >Pending labs/tests below have been ordered by myself -anti-HMGCR Ab ([facility] send out 2-3 weeks return time),  -myositis panel ([facility] send out 2-3 weeks return time) -muscle biopsy to be pursued here next week; glutaraldehyde ordered from [facility] -plan for possible IVIG and steroids after all necessary workup for hematological malignancy complete  >Additional support on an outpatient basis - Rheumatology evaluation - EMG/NCS   # Possible CLL or other malignancy  -CT chest malignancy screening to complete cancer screening -Appreciate peripheral flow cyto and oncology support   # PE, newly diagnosed 8/28 > Dx on CTA chest 8/28 obtained for malignancy screening - No evidence of DVT on LE duplex 8/29 so IVC not indicated - Will avoid IVIG for presumed inflammatory myositis treatment given it is prothrombotic   # Infections, resolving - s/p ampicillin and metronidazole 8/19 - 8/24 (initially got cefepime and IV vanc as well as aztreonam) for aspiration PNA - continues on oral vancomycin 8/21 - present for C. Diff  - Given contraindication of PE for IVIG recommend touching base with ID for treatment length for C. Diff in the setting of steroids alone  # Pituitary incidentaloma -Will obtain LH, FSH, IGF-1, ACTH (added on to prior labs) and 24 hr urine cortisol -Outpatient PCP or endocrine follow-up  # Hypernatremia, resolved  - Stable, likely secondary to poor intake while NPO, and resolved w/ tube feeds  # Nutrition   - Patient amenable to PEG tube placement   # Goals of care Patient and family agreeable to further investigation and procedures with hopes of potential recovery on the time frame of months, should he have  a treatable inflammatory myopathy. Possibility of further complication or myopathic process that is not treatable was discussed with patient and family. They are clear he would not want long-term life support, but though he gets reasonably frustrated at times he is interested in pursuing treatments as long as there is some chance for a meaningful recovery. Should there be a dramatic change in his clinical status it will be appropriate to readdress goals of care at that time.   Lesleigh Noe MD-PhD [facility] [phone]   [facility] coverage for [name] is from 7 AM to 8 AM by telephone only (no video visit capability at this time), 8 AM to 4 PM in-house, and 4 PM to 8 PM again by telephone only. Please do not hesitate to reach out to me with any questions.

## 2019-09-03 NOTE — Consult Note (Addendum)
Vascular Consult Note  MRN : [id number]  Albert [name] is a 86 y.o. (1933-08-13) male who presents with chief complaint of  Chief Complaint  Patient presents with  . Fever  . Sepsis  .  History of Present Illness: Albert [name] Albert [name] y.o.male with PMH of CKD, HTN, HLD with recent admission for  urosepsis presents from SNF with concerns for fever and hypotensive. Patient endorses cough for 3-4 days with sputum production. Also reports 2-3 weeks of dysuria. Denies urinary urgency or frequency. Denies further melena or blood in stool. No hematemesis or hemoptysis.  Overall presentation is most consistent with acute infection/sepsis, either recurrent UTI/cystitis, versus possible pulmonary etiology including COVID-19 or bacterial pneumonia. I have a lower suspicion for his abnormal vital signs being related to GI bleed given the lack of any hematemesis, recent melena, or other specific symptoms to suggest GI etiology. He is admitted for sepsis secondary to possible aspiration pneumonia and C. difficile colitis.  He is getting antibiotics.  Given dysphagia he underwent thorough evaluation,  neurology thinks it secondary to autoimmune myopathies but there is concern about myositis or dermatomyositis,  Neurology ordered multiple labs, recommended close rheumatology Out patient follow-up.  Patient underwent  upper esophagoscopy with upper esophageal sphincter dilatation.  Patient underwent modified barium swallow and had failed, palliative care consulted to discuss goals of care,  Patient want full scope of care.  Patient is very high risk for aspiration patient has a NG tube placed by IR.  Plan: Patient will require muscle biopsy for confirmation of diagnosis. Patient suffered a GI bleed last month from an esophageal ulcer. during this admission, he was found to have a sub segmental PE and I am consulted for placement of an IVC filter due to his recent GI bleed. No source for thrombus yet  identified.  Current Facility-Administered Medications  Medication Dose Route Frequency Provider Last Rate Last Admin  . 0.9 %  sodium chloride infusion   Intravenous PRN Margaretha Sheffield, MD 10 mL/hr at 08/29/19 0621 250 mL at 08/29/19 0621  . acetaminophen (TYLENOL) tablet 650 mg  650 mg Oral Q6H PRN Margaretha Sheffield, MD   650 mg at 09/02/19 2226   Or  . acetaminophen (TYLENOL) suppository 650 mg  650 mg Rectal Q6H PRN Margaretha Sheffield, MD   650 mg at 09/02/19 1301  . dextrose 5 % and 0.45 % NaCl with KCl 20 mEq/L infusion   Intravenous Continuous Margaretha Sheffield, MD 50 mL/hr at 09/02/19 0219 New Bag at 09/02/19 0219  . enoxaparin (LOVENOX) injection 40 mg  40 mg Subcutaneous Q24H Margaretha Sheffield, MD   40 mg at 09/02/19 0908  . feeding supplement (OSMOLITE 1.5 CAL) liquid 1,000 mL  1,000 mL Per Tube Continuous Shawna Clamp, MD 50 mL/hr at 09/02/19 0924 1,000 mL at 09/02/19 0924  . feeding supplement (PROSource TF) liquid 45 mL  45 mL Per Tube Daily Shawna Clamp, MD   45 mL at 09/02/19 0909  . free water 100 mL  100 mL Per Tube Q4H Shawna Clamp, MD   100 mL at 09/03/19 0804  . iohexol (OMNIPAQUE) 9 MG/ML oral solution 500 mL  500 mL Per Tube Q1H Margaretha Sheffield, MD      . metoprolol tartrate (LOPRESSOR) injection 2.5 mg  2.5 mg Intravenous Q12H Shawna Clamp, MD   2.5 mg at 09/02/19 2227  . pantoprazole (PROTONIX) injection 40 mg  40 mg Intravenous Q12H Margaretha Sheffield, MD   40 mg at 09/02/19 2226  .  vancomycin (VANCOCIN) 50 mg/mL oral solution 125 mg  125 mg Per Tube QID Margaretha Sheffield, MD   125 mg at 09/02/19 2255    Past Medical History:  Diagnosis Date  . Actinic keratosis 04/23/2008   L elbow distal tricep (bx proven)  . Basal cell carcinoma 06/17/2006   L preauricular   . Basal cell carcinoma 05/26/2012   L ant neck   . Basal cell carcinoma 11/02/2012   Glabella - excision   . Basal cell carcinoma 08/15/2014   L med ankle   . Basal cell carcinoma 02/23/2017   R lat infrapectoral -  excision  . Basal cell carcinoma 12/08/2017   L mid to distal lat volar forearm   . Hypertension   . Squamous cell carcinoma of skin 02/14/2014   R lat low back - ED&C  . Squamous cell carcinoma of skin 10/02/2014   R prox lat bicep - ED&C    Past Surgical History:  Procedure Laterality Date  . COLONOSCOPY N/A 08/05/2019   Procedure: COLONOSCOPY;  Surgeon: Toledo, Benay Pike, MD;  Location: [facility] ENDOSCOPY;  Service: Gastroenterology;  Laterality: N/A;  . ESOPHAGEAL DILATION N/A 08/30/2019   Procedure: ESOPHAGEAL DILATION;  Surgeon: Margaretha Sheffield, MD;  Location: [facility] ORS;  Service: ENT;  Laterality: N/A;  . ESOPHAGOGASTRODUODENOSCOPY N/A 08/05/2019   Procedure: ESOPHAGOGASTRODUODENOSCOPY (EGD);  Surgeon: Toledo, Benay Pike, MD;  Location: [facility] ENDOSCOPY;  Service: Gastroenterology;  Laterality: N/A;  . ESOPHAGOSCOPY N/A 08/30/2019   Procedure: ESOPHAGOSCOPY;  Surgeon: Margaretha Sheffield, MD;  Location: [facility] ORS;  Service: ENT;  Laterality: N/A;    Social History Social History   Tobacco Use  . Smoking status: Never Smoker  . Smokeless tobacco: Never Used  Vaping Use  . Vaping Use: Never used  Substance Use Topics  . Alcohol use: Not Currently  . Drug use: Never    Family History History reviewed. No pertinent family history.  No Active Allergies   REVIEW OF SYSTEMS (Negative unless checked)  As per HPI   Physical Examination  Vitals:   09/02/19 1142 09/02/19 2223 09/03/19 0548 09/03/19 0801  BP: (!) 115/56 114/70 111/60 127/75  Pulse: 86   97  Resp: 18  18 19   Temp: 97.7 F (36.5 C) 97.8 F (36.6 C) 97.6 F (36.4 C) 97.7 F (36.5 C)  TempSrc:  Oral Axillary Oral  SpO2: 98% 96% 98% 97%  Weight:   78.7 kg   Height:       Body mass index is 29.78 kg/m. Gen: WD/WN Head: [place]/AT, No temporalis wasting Ear/Nose/Throat: Hearing grossly intact, nares w/o erythema or drainage Eyes: Sclera non-icteric, conjunctiva clear Neck: Supple, no nuchal rigidity.  No JVD.   Pulmonary:  Good air movement, clear to auscultation bilaterally.  Cardiac: RRR, normal S1, S2, no Murmurs, rubs or gallops. Vascular: radial 2+/. femoral 2+. Distal pulses non palpable Gastrointestinal: soft, non-tender/non-distended. No guarding Musculoskeletal: M/S 5/5 throughout.  Extremities without ischemic changes.  No deformity or atrophy. 1+Edema in the lower extremities bilaterally Dermatologic: No rashes or ulcers noted.   Lymph : No Cervical, Axillary, or Inguinal lymphadenopathy.      CBC Lab Results  Component Value Date   WBC 11.4 (H) 09/03/2019   HGB 9.7 (L) 09/03/2019   HCT 30.6 (L) 09/03/2019   MCV 89.7 09/03/2019   PLT 208 09/03/2019    BMET    Component Value Date/Time   NA 142 09/03/2019 0600   K 4.5 09/03/2019 0600   CL 116 (H) 09/03/2019 0600  CO2 21 (L) 09/03/2019 0600   GLUCOSE 103 (H) 09/03/2019 0600   BUN 20 09/03/2019 0600   CREATININE 0.80 09/03/2019 0600   CREATININE 1.27 04/29/2011 0959   CALCIUM 7.3 (L) 09/03/2019 0600   GFRNONAA >60 09/03/2019 0600   GFRNONAA 55 (L) 04/29/2011 0959   GFRAA >60 09/03/2019 0600   GFRAA >60 04/29/2011 0959   Estimated Creatinine Clearance: 65.1 mL/min (by C-G formula based on SCr of 0.8 mg/dL).  COAG Lab Results  Component Value Date   INR 1.3 (H) 08/25/2019   INR 1.3 (H) 08/24/2019   INR 1.0 08/09/2019     Assessment/Plan 1. PE found incidentally on chest CT. Duplex of lower extremities show no acute or chronic  Thrombosis. CT abdomen and pelvis shows no venous thrombus Would hold off on IVC filter and use sequential boots and mobilization for DVT prophylaxis. Will reassess IVC filter pending clinical course    Richardson Dopp, MD  09/03/2019 9:30 AM

## 2019-09-03 NOTE — H&P (View-Only) (Signed)
Vascular Consult Note  MRN : [id number]  Albert [name] is a 86 y.o. (02-26-1933) male who presents with chief complaint of  Chief Complaint  Patient presents with   Fever   Sepsis  .  History of Present Illness: Idriss Quackenbush [id number] y.o.male with PMH of CKD, HTN, HLD with recent admission for  urosepsis presents from SNF with concerns for fever and hypotensive. Patient endorses cough for 3-4 days with sputum production. Also reports 2-3 weeks of dysuria. Denies urinary urgency or frequency. Denies further melena or blood in stool. No hematemesis or hemoptysis.  Overall presentation is most consistent with acute infection/sepsis, either recurrent UTI/cystitis, versus possible pulmonary etiology including COVID-19 or bacterial pneumonia. I have a lower suspicion for his abnormal vital signs being related to GI bleed given the lack of any hematemesis, recent melena, or other specific symptoms to suggest GI etiology. He is admitted for sepsis secondary to possible aspiration pneumonia and C. difficile colitis.  He is getting antibiotics.  Given dysphagia he underwent thorough evaluation,  neurology thinks it secondary to autoimmune myopathies but there is concern about myositis or dermatomyositis,  Neurology ordered multiple labs, recommended close rheumatology Out patient follow-up.  Patient underwent  upper esophagoscopy with upper esophageal sphincter dilatation.  Patient underwent modified barium swallow and had failed, palliative care consulted to discuss goals of care,  Patient want full scope of care.  Patient is very high risk for aspiration patient has a NG tube placed by IR.  Plan: Patient will require muscle biopsy for confirmation of diagnosis. Patient suffered a GI bleed last month from an esophageal ulcer. during this admission, he was found to have a sub segmental PE and I am consulted for placement of an IVC filter due to his recent GI bleed. No source for thrombus yet  identified.  Current Facility-Administered Medications  Medication Dose Route Frequency Provider Last Rate Last Admin   0.9 %  sodium chloride infusion   Intravenous PRN Margaretha Sheffield, MD 10 mL/hr at 08/29/19 0621 250 mL at 08/29/19 4970   acetaminophen (TYLENOL) tablet 650 mg  650 mg Oral Q6H PRN Margaretha Sheffield, MD   650 mg at 09/02/19 2226   Or   acetaminophen (TYLENOL) suppository 650 mg  650 mg Rectal Q6H PRN Margaretha Sheffield, MD   650 mg at 09/02/19 1301   dextrose 5 % and 0.45 % NaCl with KCl 20 mEq/L infusion   Intravenous Continuous Margaretha Sheffield, MD 50 mL/hr at 09/02/19 0219 New Bag at 09/02/19 0219   enoxaparin (LOVENOX) injection 40 mg  40 mg Subcutaneous Q24H Margaretha Sheffield, MD   40 mg at 09/02/19 0908   feeding supplement (OSMOLITE 1.5 CAL) liquid 1,000 mL  1,000 mL Per Tube Continuous Shawna Clamp, MD 50 mL/hr at 09/02/19 0924 1,000 mL at 09/02/19 0924   feeding supplement (PROSource TF) liquid 45 mL  45 mL Per Tube Daily Shawna Clamp, MD   45 mL at 09/02/19 0909   free water 100 mL  100 mL Per Tube Q4H Shawna Clamp, MD   100 mL at 09/03/19 0804   iohexol (OMNIPAQUE) 9 MG/ML oral solution 500 mL  500 mL Per Tube Q1H Margaretha Sheffield, MD       metoprolol tartrate (LOPRESSOR) injection 2.5 mg  2.5 mg Intravenous Q12H Shawna Clamp, MD   2.5 mg at 09/02/19 2227   pantoprazole (PROTONIX) injection 40 mg  40 mg Intravenous Q12H Margaretha Sheffield, MD   40 mg at 09/02/19 2226  vancomycin (VANCOCIN) 50 mg/mL oral solution 125 mg  125 mg Per Tube QID Margaretha Sheffield, MD   125 mg at 09/02/19 2255    Past Medical History:  Diagnosis Date   Actinic keratosis 04/23/2008   L elbow distal tricep (bx proven)   Basal cell carcinoma 06/17/2006   L preauricular    Basal cell carcinoma 05/26/2012   L ant neck    Basal cell carcinoma 11/02/2012   Glabella - excision    Basal cell carcinoma 08/15/2014   L med ankle    Basal cell carcinoma 02/23/2017   R lat infrapectoral -  excision   Basal cell carcinoma 12/08/2017   L mid to distal lat volar forearm    Hypertension    Squamous cell carcinoma of skin 02/14/2014   R lat low back - ED&C   Squamous cell carcinoma of skin 10/02/2014   R prox lat bicep - ED&C    Past Surgical History:  Procedure Laterality Date   COLONOSCOPY N/A 08/05/2019   Procedure: COLONOSCOPY;  Surgeon: Toledo, Benay Pike, MD;  Location: [facility];  Service: Gastroenterology;  Laterality: N/A;   ESOPHAGEAL DILATION N/A 08/30/2019   Procedure: ESOPHAGEAL DILATION;  Surgeon: Margaretha Sheffield, MD;  Location: [facility] ORS;  Service: ENT;  Laterality: N/A;   ESOPHAGOGASTRODUODENOSCOPY N/A 08/05/2019   Procedure: ESOPHAGOGASTRODUODENOSCOPY (EGD);  Surgeon: Toledo, Benay Pike, MD;  Location: [facility];  Service: Gastroenterology;  Laterality: N/A;   ESOPHAGOSCOPY N/A 08/30/2019   Procedure: ESOPHAGOSCOPY;  Surgeon: Margaretha Sheffield, MD;  Location: [facility] ORS;  Service: ENT;  Laterality: N/A;    Social History Social History   Tobacco Use   Smoking status: Never Smoker   Smokeless tobacco: Never Used  Vaping Use   Vaping Use: Never used  Substance Use Topics   Alcohol use: Not Currently   Drug use: Never    Family History History reviewed. No pertinent family history.  No Active Allergies   REVIEW OF SYSTEMS (Negative unless checked)  As per HPI   Physical Examination  Vitals:   09/02/19 1142 09/02/19 2223 09/03/19 0548 09/03/19 0801  BP: (!) 115/56 114/70 111/60 127/75  Pulse: 86   97  Resp: 18  18 19   Temp: 97.7 F (36.5 C) 97.8 F (36.6 C) 97.6 F (36.4 C) 97.7 F (36.5 C)  TempSrc:  Oral Axillary Oral  SpO2: 98% 96% 98% 97%  Weight:   78.7 kg   Height:       Body mass index is 29.78 kg/m. Gen: WD/WN Head: [place]/AT, No temporalis wasting Ear/Nose/Throat: Hearing grossly intact, nares w/o erythema or drainage Eyes: Sclera non-icteric, conjunctiva clear Neck: Supple, no nuchal rigidity.  No JVD.   Pulmonary:  Good air movement, clear to auscultation bilaterally.  Cardiac: RRR, normal S1, S2, no Murmurs, rubs or gallops. Vascular: radial 2+/. femoral 2+. Distal pulses non palpable Gastrointestinal: soft, non-tender/non-distended. No guarding Musculoskeletal: M/S 5/5 throughout.  Extremities without ischemic changes.  No deformity or atrophy. 1+Edema in the lower extremities bilaterally Dermatologic: No rashes or ulcers noted.   Lymph : No Cervical, Axillary, or Inguinal lymphadenopathy.      CBC Lab Results  Component Value Date   WBC 11.4 (H) 09/03/2019   HGB 9.7 (L) 09/03/2019   HCT 30.6 (L) 09/03/2019   MCV 89.7 09/03/2019   PLT 208 09/03/2019    BMET    Component Value Date/Time   NA 142 09/03/2019 0600   K 4.5 09/03/2019 0600   CL 116 (H) 09/03/2019 0600  CO2 21 (L) 09/03/2019 0600   GLUCOSE 103 (H) 09/03/2019 0600   BUN 20 09/03/2019 0600   CREATININE 0.80 09/03/2019 0600   CREATININE 1.27 04/29/2011 0959   CALCIUM 7.3 (L) 09/03/2019 0600   GFRNONAA >60 09/03/2019 0600   GFRNONAA 55 (L) 04/29/2011 0959   GFRAA >60 09/03/2019 0600   GFRAA >60 04/29/2011 0959   Estimated Creatinine Clearance: 65.1 mL/min (by C-G formula based on SCr of 0.8 mg/dL).  COAG Lab Results  Component Value Date   INR 1.3 (H) 08/25/2019   INR 1.3 (H) 08/24/2019   INR 1.0 08/09/2019     Assessment/Plan 1. PE found incidentally on chest CT. Duplex of lower extremities show no acute or chronic  Thrombosis. CT abdomen and pelvis shows no venous thrombus Would hold off on IVC filter and use sequential boots and mobilization for DVT prophylaxis. Will reassess IVC filter pending clinical course    Richardson Dopp, MD  09/03/2019 9:30 AM

## 2019-09-04 ENCOUNTER — Ambulatory Visit: Admit: 2019-09-04 | Payer: Medicare Other

## 2019-09-04 ENCOUNTER — Other Ambulatory Visit (INDEPENDENT_AMBULATORY_CARE_PROVIDER_SITE_OTHER): Payer: Self-pay | Admitting: Vascular Surgery

## 2019-09-04 ENCOUNTER — Encounter
Admission: EM | Disposition: A | Discharge status: Skilled Nursing Facility | Payer: Self-pay | Source: Home / Self Care | Attending: Family Medicine

## 2019-09-04 ENCOUNTER — Encounter: Payer: Self-pay | Admitting: Vascular Surgery

## 2019-09-04 DIAGNOSIS — I2699 Other pulmonary embolism without acute cor pulmonale: Secondary | ICD-10-CM

## 2019-09-04 DIAGNOSIS — G729 Myopathy, unspecified: Secondary | ICD-10-CM

## 2019-09-04 HISTORY — PX: IVC FILTER INSERTION: CATH118245

## 2019-09-04 LAB — BASIC METABOLIC PANEL
Anion gap: 4 — ABNORMAL LOW (ref 5–15)
BUN: 20 mg/dL (ref 8–23)
CO2: 22 mmol/L (ref 22–32)
Calcium: 7.3 mg/dL — ABNORMAL LOW (ref 8.9–10.3)
Chloride: 115 mmol/L — ABNORMAL HIGH (ref 98–111)
Creatinine, Ser: 0.72 mg/dL (ref 0.61–1.24)
GFR calc Af Amer: >60 mL/min (ref >60)
GFR calc non Af Amer: >60 mL/min (ref >60)
Glucose, Bld: 108 mg/dL — ABNORMAL HIGH (ref 70–99)
Potassium: 5.1 mmol/L (ref 3.5–5.1)
Sodium: 141 mmol/L (ref 135–145)

## 2019-09-04 LAB — PHOSPHORUS: Phosphorus: 3 mg/dL (ref 2.5–4.6)

## 2019-09-04 LAB — MAGNESIUM: Magnesium: 1.7 mg/dL (ref 1.7–2.4)

## 2019-09-04 LAB — CBC
HCT: 29.7 % — ABNORMAL LOW (ref 39.0–52.0)
Hemoglobin: 9.1 g/dL — ABNORMAL LOW (ref 13.0–17.0)
MCH: 28.3 pg (ref 26.0–34.0)
MCHC: 30.6 g/dL (ref 30.0–36.0)
MCV: 92.5 fL (ref 80.0–100.0)
Platelets: 163 10*3/uL (ref 150–400)
RBC: 3.21 MIL/uL — ABNORMAL LOW (ref 4.22–5.81)
RDW: 18.7 % — ABNORMAL HIGH (ref 11.5–15.5)
WBC: 9.7 10*3/uL (ref 4.0–10.5)
nRBC: 0 % (ref 0.0–0.2)

## 2019-09-04 LAB — ACTH: C206 ACTH: 11.3 pg/mL (ref 7.2–63.3)

## 2019-09-04 SURGERY — IVC FILTER INSERTION
Anesthesia: Moderate Sedation

## 2019-09-04 MED ORDER — FENTANYL CITRATE (PF) 100 MCG/2ML IJ SOLN
INTRAMUSCULAR | Status: AC
  Filled 2019-09-04: qty 2

## 2019-09-04 MED ORDER — SODIUM CHLORIDE 0.9 % IV SOLN: 500.0000 mg | Freq: Two times a day (BID) | INTRAVENOUS | Status: DC

## 2019-09-04 MED ORDER — SODIUM CHLORIDE 0.9 % IV SOLN: INTRAVENOUS | Status: DC

## 2019-09-04 MED ORDER — IODIXANOL 320 MG/ML IV SOLN
INTRAVENOUS | Status: DC | PRN
  Administered 2019-09-04 (×2): 15 mL

## 2019-09-04 MED ORDER — MIDAZOLAM HCL 2 MG/2ML IJ SOLN
INTRAMUSCULAR | Status: DC | PRN
  Administered 2019-09-04: 1 mg via INTRAVENOUS

## 2019-09-04 MED ORDER — DIPHENHYDRAMINE HCL 50 MG/ML IJ SOLN: 50.0000 mg | Freq: Once | INTRAMUSCULAR | Status: DC | PRN

## 2019-09-04 MED ORDER — ONDANSETRON HCL 4 MG/2ML IJ SOLN: 4.0000 mg | Freq: Four times a day (QID) | INTRAMUSCULAR | Status: DC | PRN

## 2019-09-04 MED ORDER — METHYLPREDNISOLONE SODIUM SUCC 125 MG IJ SOLR: 125.0000 mg | Freq: Once | INTRAMUSCULAR | Status: DC | PRN

## 2019-09-04 MED ORDER — MIDAZOLAM HCL 2 MG/ML PO SYRP: 8.0000 mg | ORAL_SOLUTION | Freq: Once | ORAL | Status: DC | PRN

## 2019-09-04 MED ORDER — CEFAZOLIN SODIUM-DEXTROSE 2-4 GM/100ML-% IV SOLN
INTRAVENOUS | Status: AC
  Administered 2019-09-04: 2 g via INTRAVENOUS
  Filled 2019-09-04: qty 100

## 2019-09-04 MED ORDER — PANTOPRAZOLE SODIUM 40 MG IV SOLR
40.0000 mg | INTRAVENOUS | Status: DC
  Administered 2019-09-04 – 2019-09-09 (×6): 40 mg via INTRAVENOUS
  Filled 2019-09-04 (×6): qty 40

## 2019-09-04 MED ORDER — FAMOTIDINE 20 MG PO TABS: 40.0000 mg | ORAL_TABLET | Freq: Once | ORAL | Status: DC | PRN

## 2019-09-04 MED ORDER — FENTANYL CITRATE (PF) 100 MCG/2ML IJ SOLN
INTRAMUSCULAR | Status: DC | PRN
  Administered 2019-09-04: 25 ug via INTRAVENOUS

## 2019-09-04 MED ORDER — DEXTROSE-NACL 5-0.45 % IV SOLN: INTRAVENOUS | Status: DC

## 2019-09-04 MED ORDER — HYDROMORPHONE HCL 1 MG/ML IJ SOLN: 1.0000 mg | Freq: Once | INTRAMUSCULAR | Status: DC | PRN

## 2019-09-04 MED ORDER — CEFAZOLIN SODIUM-DEXTROSE 2-4 GM/100ML-% IV SOLN: 2.0000 g | Freq: Once | INTRAVENOUS | Status: AC

## 2019-09-04 MED ORDER — MIDAZOLAM HCL 5 MG/5ML IJ SOLN
INTRAMUSCULAR | Status: AC
  Filled 2019-09-04: qty 5

## 2019-09-04 SURGICAL SUPPLY — 4 items
KIT FEMORAL DEL DENALI (Miscellaneous) ×2 IMPLANT
PACK ANGIOGRAPHY (CUSTOM PROCEDURE TRAY) ×3 IMPLANT
SHEATH BRITE TIP 5FRX11 (SHEATH) ×2 IMPLANT
WIRE J 3MM .035X145CM (WIRE) ×2 IMPLANT

## 2019-09-04 NOTE — Progress Notes (Signed)
PROGRESS NOTE    Albert [name]  GQQ:[id number] DOB: [birth year] DOA: 08/24/2019   PCP: [name], MD    Brief Narrative: Albert [name] is 86 y.o. male with PMH of CKD, HTN, HLD with recent admission for GI bleed and urosepsis presents from SNF with concerns for fever and hypotensive. Patient endorses cough for 3-4 days with sputum production. Also reports 2-3 weeks of dysuria. Denies urinary urgency or frequency. Denies further melena or blood in stool. No hematemesis or hemoptysis.  Overall presentation is most consistent with acute infection/sepsis, either recurrent UTI/cystitis, versus possible pulmonary etiology including COVID-19 or bacterial pneumonia. There was lower suspicion for his abnormal vital signs being related to GI bleed given the lack of any hematemesis, recent melena, or other specific symptoms to suggest GI etiology.  He is admitted for sepsis secondary to possible aspiration pneumonia and C. difficile colitis.  He is getting antibiotics.  Given dysphagia he underwent thorough evaluation,  neurology thinks it secondary to autoimmune myopathies but there is concern about myositis or dermatomyositis,  Neurology ordered multiple labs, recommended close rheumatology Out patient follow-up.  Patient underwent  upper esophagoscopy with upper esophageal sphincter dilatation.  Patient underwent modified barium swallow and had failed, palliative care consulted to discuss goals of care,  Patient want full scope of care.  Patient is very high risk for aspiration,  Patient has a NG tube placed by IR.  Plan: Patient will require muscle biopsy for confirmation of diagnosis.  Patient will need PEG tube for nutrition,  He is found to have small PE on CT chest,  Venous duplex negative for DVT. Vascular  Surgery consulted for IVC filter, underwent IVC filter placement.  Patient needs thoracocentesis for pleural effusion , cytology for diagnosis.  Assessment & Plan:   Active Problems:    Elevated troponin   Benign essential hypertension   CKD (chronic kidney disease), stage III   Sepsis (HCC)   Lactic acidosis   Anemia   Elevated brain natriuretic peptide (BNP) level   Aspiration pneumonia of right lower lobe due to gastric secretions (HCC)   Dysphagia   C. difficile colitis   Ulcer of esophagus without bleeding   Hypomagnesemia   Hypokalemia   Advanced care planning/counseling discussion   Goals of care, counseling/discussion   Autoimmune disease ([name])   Palliative care by specialist   1. Sepsis, present on admission with hypotension.  This has improved.  Completed treatment for Aspiration pneumonia with Unasyn (day 5 of 5),          C. difficile colitis on vancomycin via NG tube now.  DC IV Flagyl.  2.  C.difficile colitis.  Vancomycin via NG tube.  3. Dysphagia with failed swallow evaluation with aspiration on esophagram and modified barium swallow.        MRI : Findings may be concerning for myositis or myopathies.  Recommended labs and close outpatient rheumatology follow-up.      ENT; Patient underwent rigid esophagoscopy with dilatation of the stricture.  Tolerated well.        Interventional radiology can potentially do a PEG feeding tube after completion of treatment for C. difficile colitis.        Currently has NG tube. Can either resume  tube feeding or trial of swallowing after EUS dilation.       Patient failed modified barium swallow, speech recommended n.p.o. alternate ways of feeding.        Palliative consulted to discuss goals of care. Patient wants  full scope of care.      Plan:  Patient needs PEG tube after completion of treatment for C. difficile.  Continue NG tube for now.            Patient will need muscular biopsy for confirmation of diagnosis.  4.  Bilateral pleural effusion: Patient will get thoracocentesis by IR Tomorrow and possible cytology for diagnosis.  5.  Small PE: Patient is found to have small PE on CT chest, venous duplex  negative,  vascular Surgery consulted for IVC placement.          He underwent IVC filter placement , tolerated well.   4. Hyperlipidemia.  Hold atorvastatin.  5. Hypomagnesemia >>> Improved. recheck electrolytes tomorrow.  6. Hypokalemia  >>> Improved.  replace potassium as needed  and IV fluids.  7. Chronic kidney disease stage II: Renal functions back to base line.   8. Iron deficiency anemia with prior blood loss, esophageal ulcer on recent endoscopy.  Hemoglobin on the lower side but stable.  9. Smudge cells seen on CBC.  Will need referral to hematology as outpatient for likely underlying CLL. Repeat labs: No smudge cells.  10.  Esophageal ulcer and esophagitis on Protonix.   DVT prophylaxis: Lovenox Code Status:  Full Family Communication: No one at bed side, D/W patient. Disposition Plan: Dispo: The patient is from: Rehab  Anticipated d/c is to: Rehab  Anticipated d/c date is: Yet to be determined secondary the patient will have to have some way of getting nutrition  Patient currently being treated for aspiration pneumonia and C. difficile colitis.  Also failed swallow evaluation.              Palliative care consulted to discuss goals of care, patient wants full scope of treatment.              Plan : Family meeting completed, patient wants full scope of treatment.  Scheduled for PEG tube on Monday.  Patient is not medically clear given ongoing work-up.  Consultants:    ENT, Neurology, cardiology  Procedures:    Antimicrobials:  Anti-infectives (From admission, onward)   Start     Dose/Rate Route Frequency Ordered Stop   09/05/19 0000  ceFAZolin (ANCEF) IVPB 2g/100 mL premix       Note to Pharmacy: To be given in specials   2 g 200 mL/hr over 30 Minutes Intravenous  Once 09/04/19 1146 09/04/19 1329   08/29/19 1800  vancomycin (VANCOCIN) 50 mg/mL oral solution 125 mg        125 mg Per Tube 4 times daily 08/29/19 1641 09/08/19  1759   08/28/19 1500  vancomycin (VANCOCIN) 500 mg in sodium chloride irrigation 0.9 % 100 mL ENEMA  Status:  Discontinued        500 mg Rectal Every 6 hours 08/28/19 1340 08/29/19 1641   08/28/19 1500  metroNIDAZOLE (FLAGYL) IVPB 500 mg  Status:  Discontinued        500 mg 100 mL/hr over 60 Minutes Intravenous Every 8 hours 08/28/19 1341 08/29/19 1642   08/26/19 1400  vancomycin (VANCOCIN) 50 mg/mL oral solution 125 mg  Status:  Discontinued        125 mg Oral 4 times daily 08/26/19 1255 08/28/19 1340   08/25/19 2000  vancomycin (VANCOCIN) IVPB 1000 mg/200 mL premix  Status:  Discontinued        1,000 mg 200 mL/hr over 60 Minutes Intravenous Every 24 hours 08/25/19 0240 08/25/19 1440   08/25/19 2000  doxycycline (VIBRA-TABS) tablet 100 mg  Status:  Discontinued        100 mg Oral Every 12 hours 08/25/19 1440 08/26/19 1254   08/25/19 1800  Ampicillin-Sulbactam (UNASYN) 3 g in sodium chloride 0.9 % 100 mL IVPB        3 g 200 mL/hr over 30 Minutes Intravenous Every 6 hours 08/25/19 1605 08/29/19 2336   08/25/19 1000  ceFEPIme (MAXIPIME) 2 g in sodium chloride 0.9 % 100 mL IVPB  Status:  Discontinued        2 g 200 mL/hr over 30 Minutes Intravenous Every 12 hours 08/25/19 0731 08/25/19 1548   08/25/19 0900  aztreonam (AZACTAM) 1 g in sodium chloride 0.9 % 100 mL IVPB  Status:  Discontinued        1 g 200 mL/hr over 30 Minutes Intravenous Every 8 hours 08/25/19 0228 08/25/19 0729   08/25/19 0015  aztreonam (AZACTAM) 2 g in sodium chloride 0.9 % 100 mL IVPB        2 g 200 mL/hr over 30 Minutes Intravenous  Once 08/25/19 0014 08/25/19 0115   08/25/19 0015  metroNIDAZOLE (FLAGYL) IVPB 500 mg  Status:  Discontinued        500 mg 100 mL/hr over 60 Minutes Intravenous Every 8 hours 08/25/19 0014 08/25/19 1548   08/25/19 0015  vancomycin (VANCOCIN) IVPB 1000 mg/200 mL premix        1,000 mg 200 mL/hr over 60 Minutes Intravenous  Once 08/25/19 0014 08/25/19 0222   08/24/19 2145  ceFEPIme  (MAXIPIME) 2 g in sodium chloride 0.9 % 100 mL IVPB        2 g 200 mL/hr over 30 Minutes Intravenous  Once 08/24/19 2134 08/24/19 2222   08/24/19 2145  vancomycin (VANCOCIN) IVPB 1000 mg/200 mL premix        1,000 mg 200 mL/hr over 60 Minutes Intravenous  Once 08/24/19 2134 08/24/19 2251     Subjective: Patient was seen and examined at bedside, Overnight events noted.  Patient seen before IVC filter, offers no complaints. He states that he wants PEG tube, denies any shortness of breath.   Objective: Vitals:   09/04/19 1335 09/04/19 1345 09/04/19 1400 09/04/19 1415  BP: (!) 119/53 (!) 109/55 (!) 122/57 129/62  Pulse: 80 81 81 82  Resp: 14 16 14 12   Temp:      TempSrc:      SpO2: 98% 97% 98% 97%  Weight:      Height:        Intake/Output Summary (Last 24 hours) at 09/04/2019 1440 Last data filed at 09/04/2019 0354 Gross per 24 hour  Intake 1282 ml  Output 600 ml  Net 682 ml   Filed Weights   09/02/19 0537 09/03/19 0548 09/04/19 0349  Weight: 73 kg 78.7 kg 78.2 kg    Examination:  General exam: Appears calm and comfortable , NG tube in Rt nostril Respiratory system: Clear to auscultation. Respiratory effort normal. Cardiovascular system: S1 & S2 heard, RRR. No JVD, murmurs, rubs, gallops or clicks. No pedal edema. Gastrointestinal system: Abdomen is nondistended, soft and nontender. No organomegaly or masses felt. Normal bowel sounds heard. Central nervous system: Alert and oriented. No focal neurological deficits. Extremities: No leg edema, no cyanosis. Skin: No rashes, lesions or ulcers Psychiatry: Judgement and insight appear normal. Mood & affect appropriate.     Data Reviewed: I have personally reviewed following labs and imaging studies  CBC: Recent Labs  Lab 08/31/19 0604 09/01/19 0526  09/02/19 0642 09/03/19 0600 09/04/19 0429  WBC 7.8 8.0 8.2 11.4* 9.7  HGB 9.4* 8.9* 9.3* 9.7* 9.1*  HCT 29.4* 29.2* 29.7* 30.6* 29.7*  MCV 90.2 93.0 91.4 89.7 92.5    PLT 227 216 189 208 735   Basic Metabolic Panel: Recent Labs  Lab 08/31/19 0604 09/01/19 0526 09/02/19 0642 09/03/19 0600 09/03/19 0841 09/04/19 0429  NA 151* 151* 145 142  --  141  K 3.8 4.0 4.4 4.5  --  5.1  CL 122* 122* 120* 116*  --  115*  CO2 19* 21* 18* 21*  --  22  GLUCOSE 86 141* 130* 103*  --  108*  BUN 19 18 21 20   --  20  CREATININE 1.14 1.06 1.03 0.80  --  0.72  CALCIUM 7.6* 7.7* 7.4* 7.3*  --  7.3*  MG 1.8 1.7 1.5*  --  2.1 1.7  PHOS 3.7 3.4 3.1  --   --  3.0   GFR: Estimated Creatinine Clearance: 64.9 mL/min (by C-G formula based on SCr of 0.72 mg/dL). Liver Function Tests: Recent Labs  Lab 08/30/19 1403  AST 36  ALT 14  ALKPHOS 58  BILITOT 0.8  PROT 5.0*  ALBUMIN 1.8*   No results for input(s): LIPASE, AMYLASE in the last 168 hours. No results for input(s): AMMONIA in the last 168 hours. Coagulation Profile: No results for input(s): INR, PROTIME in the last 168 hours. Cardiac Enzymes: Recent Labs  Lab 08/29/19 1338  CKTOTAL 45*   BNP (last 3 results) No results for input(s): PROBNP in the last 8760 hours. HbA1C: No results for input(s): HGBA1C in the last 72 hours. CBG: No results for input(s): GLUCAP in the last 168 hours. Lipid Profile: No results for input(s): CHOL, HDL, LDLCALC, TRIG, CHOLHDL, LDLDIRECT in the last 72 hours. Thyroid Function Tests: No results for input(s): TSH, T4TOTAL, FREET4, T3FREE, THYROIDAB in the last 72 hours. Anemia Panel: Recent Labs    09/02/19 0642  FOLATE 8.3  RETICCTPCT 2.1   Sepsis Labs: No results for input(s): PROCALCITON, LATICACIDVEN in the last 168 hours.  Recent Results (from the past 240 hour(s))  MRSA PCR Screening     Status: None   Collection Time: 08/25/19 10:33 PM   Specimen: Nasopharyngeal  Result Value Ref Range Status   MRSA by PCR NEGATIVE NEGATIVE Final    Comment:        The GeneXpert MRSA Assay (FDA approved for NASAL specimens only), is one component of a comprehensive  MRSA colonization surveillance program. It is not intended to diagnose MRSA infection nor to guide or monitor treatment for MRSA infections. Performed at [facility], [place], [place] [address]   C Difficile Quick Screen w PCR reflex     Status: Abnormal   Collection Time: 08/26/19 10:22 AM   Specimen: STOOL  Result Value Ref Range Status   C Diff antigen POSITIVE (A) NEGATIVE Final   C Diff toxin POSITIVE (A) NEGATIVE Final   C Diff interpretation Toxin producing C. difficile detected.  Final    Comment: CRITICAL RESULT CALLED TO, READ BACK BY AND VERIFIED WITH: ANNIE RITCHIE AT 1128 08/26/19.PMF Performed at [facility], [address] [place],  [address]      Radiology Studies: PERIPHERAL VASCULAR CATHETERIZATION  Result Date: 09/04/2019 See op note  US Venous Img Lower Bilateral (DVT)  Result Date: 09/03/2019 CLINICAL DATA:  86 year old with small pulmonary emboli on recent chest CT. EXAM: BILATERAL LOWER EXTREMITY  VENOUS DOPPLER ULTRASOUND TECHNIQUE: Gray-scale sonography with graded compression, as well as color Doppler and duplex ultrasound were performed to evaluate the lower extremity deep venous systems from the level of the common femoral vein and including the common femoral, femoral, profunda femoral, popliteal and calf veins including the posterior tibial, peroneal and gastrocnemius veins when visible. The superficial great saphenous vein was also interrogated. Spectral Doppler was utilized to evaluate flow at rest and with distal augmentation maneuvers in the common femoral, femoral and popliteal veins. COMPARISON:  None. FINDINGS: RIGHT LOWER EXTREMITY Common Femoral Vein: No evidence of thrombus. Normal compressibility, respiratory phasicity and response to augmentation. Saphenofemoral Junction: No evidence of thrombus. Normal compressibility and flow on color Doppler imaging. Profunda Femoral Vein: No evidence of thrombus.  Normal compressibility and flow on color Doppler imaging. Femoral Vein: No evidence of thrombus. Normal compressibility, respiratory phasicity and response to augmentation. Popliteal Vein: No evidence of thrombus. Normal compressibility, respiratory phasicity and response to augmentation. Calf Veins: No evidence of thrombus. Normal compressibility and flow on color Doppler imaging. Other Findings:  None. LEFT LOWER EXTREMITY Common Femoral Vein: No evidence of thrombus. Normal compressibility, respiratory phasicity and response to augmentation. Saphenofemoral Junction: No evidence of thrombus. Normal compressibility and flow on color Doppler imaging. Profunda Femoral Vein: No evidence of thrombus. Normal compressibility and flow on color Doppler imaging. Femoral Vein: No evidence of thrombus. Normal compressibility, respiratory phasicity and response to augmentation. Popliteal Vein: No evidence of thrombus. Normal compressibility, respiratory phasicity and response to augmentation. Calf Veins: No evidence of thrombus. Normal compressibility and flow on color Doppler imaging. Other Findings: Extensive edema in the lower extremities and difficult to completely compress many of the vessels. IMPRESSION: No evidence of deep venous thrombosis in either lower extremity. Electronically Signed   By: Markus Daft M.D.   On: 09/03/2019 09:01    Scheduled Meds: . enoxaparin (LOVENOX) injection  40 mg Subcutaneous Q24H  . feeding supplement (PROSource TF)  45 mL Per Tube Daily  . fentaNYL      . free water  100 mL Per Tube Q4H  . metoprolol tartrate  2.5 mg Intravenous Q12H  . midazolam      . pantoprazole (PROTONIX) IV  40 mg Intravenous Q12H  . vancomycin  125 mg Per Tube QID   Continuous Infusions: . sodium chloride 250 mL (08/29/19 0621)  . dextrose 5 % and 0.45 % NaCl with KCl 20 mEq/L 50 mL/hr at 09/03/19 1155  . feeding supplement (OSMOLITE 1.5 CAL) 1,000 mL (09/03/19 1015)     LOS: 10 days    Time  spent: 35 mins.   Shawna Clamp, MD [facility]   If 7PM-7AM, please contact night-coverage

## 2019-09-04 NOTE — Interval H&P Note (Signed)
History and Physical Interval Note:  09/04/2019 12:18 PM  Albert [name]  has presented today for surgery, with the diagnosis of PE.  The various methods of treatment have been discussed with the patient and family. After consideration of risks, benefits and other options for treatment, the patient has consented to  Procedure(s): IVC FILTER INSERTION (N/A) as a surgical intervention.  The patient's history has been reviewed, patient examined, no change in status, stable for surgery.  I have reviewed the patient's chart and labs.  Questions were answered to the patient's satisfaction.     [name]

## 2019-09-04 NOTE — Progress Notes (Signed)
OT Cancellation Note  Patient Details Name: ISSAIAH SEABROOKS MRN: [id number] DOB: July 22, 1933   Cancelled Treatment:    Reason Eval/Treat Not Completed: Patient at procedure or test/ unavailable  Pt OTF to have IVC filter placed at this time. Will f/u as able/approriate for OT treatment. Thank you.  Gerrianne Scale, [place], OTR/L ascom [phone] 09/04/19, 11:53 AM

## 2019-09-04 NOTE — TOC Progression Note (Signed)
Transition of Care [facility]) - Progression Note    Patient Details  Name: Albert [name] MRN: [id number] Date of Birth: 01/21/1933  Transition of Care [facility]) CM/SW Contact  Meriel Flavors, LCSW Phone Number: 09/04/2019, 10:29 AM  Clinical Narrative:    CSW spoke with patient and family to discuss discharge plan and bed offers for SNF. Patient's daughter, Carlyon Shadow stated was a patient at [facility] before coming to the hospital and he will plan on going back at discharge. CSW confirmed she will call [facility] and make sure he is able to return at discharge   Expected Discharge Plan: [place] Barriers to Discharge: Continued Medical Work up  Expected Discharge Plan and Services Expected Discharge Plan: [place] In-house Referral:  (Caregivers (family) that take turns in the home) Discharge Planning Services: CM Consult Post Acute Care Choice: [place]                                         Social Determinants of Health (SDOH) Interventions    Readmission Risk Interventions No flowsheet data found.

## 2019-09-04 NOTE — Op Note (Signed)
[place] [facility]   OPERATIVE NOTE    PRE-OPERATIVE DIAGNOSIS: PE, GI bleed and anemia  POST-OPERATIVE DIAGNOSIS: same as above  PROCEDURE: 1.   Ultrasound guidance for vascular access to the right femoral vein 2.   Catheter placement into the inferior vena cava 3.   Inferior venacavogram 4.   Placement of a Bard Denali IVC filter  SURGEON: [name], MD  ASSISTANT(S): None  ANESTHESIA: local with Moderate Conscious Sedation for approximately 15 minutes using 1 mg of Versed and 25 mcg of Fentanyl  ESTIMATED BLOOD LOSS: minimal  CONTRAST: 15 cc  FLUORO TIME: less than one minute  FINDING(S): 1.  Patent IVC  SPECIMEN(S):  none  INDICATIONS:   Albert [name] is a 86 y.o. male who presents with PE and a history of GI bleed and anemia as well as many other ongoing issues.  Inferior vena cava filter is indicated for this reason.  Risks and benefits including filter thrombosis, migration, fracture, bleeding, and infection were all discussed.  We discussed that all IVC filters that we place can be removed if desired from the patient once the need for the filter has passed.    DESCRIPTION: After obtaining full informed written consent, the patient was brought back to the vascular suite. The skin was sterilely prepped and draped in a sterile surgical field was created. Moderate conscious sedation was administered during a face to face encounter with the patient throughout the procedure with my supervision of the RN administering medicines and monitoring the patient's vital signs, pulse oximetry, telemetry and mental status throughout from the start of the procedure until the patient was taken to the recovery room. The right femoral vein was accessed under direct ultrasound guidance without difficulty with a Seldinger needle and a J-wire was then placed. After skin nick and dilatation, the delivery sheath was placed into the inferior vena cava and an inferior venacavogram  was performed. This demonstrated a patent IVC with the level of the renal veins at the L1-L2 interspace.  The filter was then deployed into the inferior vena cava at the level of L2-L3 interspace just below the renal veins. The delivery sheath was then removed. Pressure was held. Sterile dressings were placed. The patient tolerated the procedure well and was taken to the recovery room in stable condition.  COMPLICATIONS: None  CONDITION: Stable  [name]  09/04/2019, 1:22 PM   This note was created with Dragon Medical transcription system. Any errors in dictation are purely unintentional.

## 2019-09-04 NOTE — Progress Notes (Signed)
PT Cancellation Note  Patient Details Name: Albert [name] MRN: [id number] DOB: [birth year]   Cancelled Treatment:    Reason Eval/Treat Not Completed: Patient at procedure or test/unavailable (Chart reviewed, pt off floor for surgical procedure. Will resume services at later date/time.)  1:52 PM, 09/04/19 [name], PT, DPT Physical Therapist - [place]  [phone] ([place])    Holden C 09/04/2019, 1:52 PM

## 2019-09-04 NOTE — Progress Notes (Addendum)
Reason for consult: Difficulty swallowing, muscle weakness  Subjective:No significant change in exam.  Pending PEG placement and IVC filter  today   ROS: negative except above  Examination  Vital signs in last 24 hours: Temp:  [95.6 F (35.3 C)-98.5 F (36.9 C)] 95.6 F (35.3 C) (08/30 1140) Pulse Rate:  [77-93] 82 (08/30 1415) Resp:  [12-20] 12 (08/30 1415) BP: (109-138)/(53-83) 129/62 (08/30 1415) SpO2:  [95 %-100 %] 97 % (08/30 1415) Weight:  [78.2 kg] 78.2 kg (08/30 0349)  General: lying in bed CVS: pulse-normal rate and rhythm RS: breathing comfortably Extremities: normal   Neuro: MS: Alert, oriented, follows commands CN: pupils equal and reactive,  EOMI, face symmetric, tongue midline, normal sensation over face, Upper extremity strength 4/5 proximal muscles, distal muscles 5 x 5 strength.  Lower extremity 3/ 5 proximal muscle strength in hip flexion, plantarflexion 5/ 5.  Basic Metabolic Panel: Recent Labs  Lab 08/31/19 0604 08/31/19 0604 09/01/19 0526 09/01/19 0526 09/02/19 0642 09/03/19 0600 09/03/19 0841 09/04/19 0429  NA 151*  --  151*  --  145 142  --  141  K 3.8  --  4.0  --  4.4 4.5  --  5.1  CL 122*  --  122*  --  120* 116*  --  115*  CO2 19*  --  21*  --  18* 21*  --  22  GLUCOSE 86  --  141*  --  130* 103*  --  108*  BUN 19  --  18  --  21 20  --  20  CREATININE 1.14  --  1.06  --  1.03 0.80  --  0.72  CALCIUM 7.6*   < > 7.7*   < > 7.4* 7.3*  --  7.3*  MG 1.8  --  1.7  --  1.5*  --  2.1 1.7  PHOS 3.7  --  3.4  --  3.1  --   --  3.0   < > = values in this interval not displayed.    CBC: Recent Labs  Lab 08/31/19 0604 09/01/19 0526 09/02/19 0642 09/03/19 0600 09/04/19 0429  WBC 7.8 8.0 8.2 11.4* 9.7  HGB 9.4* 8.9* 9.3* 9.7* 9.1*  HCT 29.4* 29.2* 29.7* 30.6* 29.7*  MCV 90.2 93.0 91.4 89.7 92.5  PLT 227 216 189 208 163     Coagulation Studies: No results for input(s): LABPROT, INR in the last 72 hours.  Imaging Reviewed:      ASSESSMENT AND PLAN  This is an 86 year old gentleman who is previously fairly healthy and has had a complicated course recently with multiple hospitalizations, GI bleed, C. Diff infection and aspiration pneumonia.   His general examination is notable for diffuse atrophy, lower extremity edema, and a resolving rash on his trunk and arms with some possible nailbed changes on his toes.  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  Additionally he the aforementioned muscular atrophy as well as a pattern of proximal greater than distal weakness suggestive of a myopathy or myositis.  He does not have features suggestive of myasthenia gravis such as fatigability, diplopia.  He does have some features suggestive of an autoimmune process such as positive ANA and SSB and the notable rash and skin changes.  There is always a possibility of an underlying paraneoplastic process; successful treatment of possible paraneoplastic myositis requires an exhaustive malignancy workup as underlying malignancy must be  addressed.     -Biopsy still pending, waiting for dye.  Will touch base with tomorrow.  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 Recommendations  IV Solu-Medrol 500 mg every 12 hours x 5 days once Oncology finishes diagnostic testing Protonix IV 40 mg daily for GI prophylaxis Continue arrange for biopsy, Follow-up HMG-CoA reductase antibody, myositis pending EMG nerve conduction study outpatient Outpatient rheumatology   Elishua Radford [facility] Pager Number [phone] For questions after 7pm please refer to AMION to reach the Neurologist on call

## 2019-09-04 NOTE — Progress Notes (Signed)
Palliative-   Goals of care are set for patient for now- Full code, Full scope.   Palliative will sign off- please feel free to reconsult in the future if needed.   Thank you for allowing Palliative to be involved in patient's care.   Mariana Kaufman, AGNP-C Palliative Medicine  Please call Palliative Medicine team phone with any questions [phone]. For individual providers please see AMION.

## 2019-09-05 ENCOUNTER — Inpatient Hospital Stay: Payer: Medicare Other

## 2019-09-05 DIAGNOSIS — J90 Pleural effusion, not elsewhere classified: Secondary | ICD-10-CM

## 2019-09-05 DIAGNOSIS — I82409 Acute embolism and thrombosis of unspecified deep veins of unspecified lower extremity: Secondary | ICD-10-CM

## 2019-09-05 LAB — COMP PANEL: LEUKEMIA/LYMPHOMA

## 2019-09-05 LAB — CBC
HCT: 29.2 % — ABNORMAL LOW (ref 39.0–52.0)
Hemoglobin: 9.4 g/dL — ABNORMAL LOW (ref 13.0–17.0)
MCH: 28.4 pg (ref 26.0–34.0)
MCHC: 32.2 g/dL (ref 30.0–36.0)
MCV: 88.2 fL (ref 80.0–100.0)
Platelets: 177 10*3/uL (ref 150–400)
RBC: 3.31 MIL/uL — ABNORMAL LOW (ref 4.22–5.81)
RDW: 18.7 % — ABNORMAL HIGH (ref 11.5–15.5)
WBC: 9.4 10*3/uL (ref 4.0–10.5)
nRBC: 0 % (ref 0.0–0.2)

## 2019-09-05 LAB — BASIC METABOLIC PANEL
Anion gap: 4 — ABNORMAL LOW (ref 5–15)
BUN: 18 mg/dL (ref 8–23)
CO2: 23 mmol/L (ref 22–32)
Calcium: 7.2 mg/dL — ABNORMAL LOW (ref 8.9–10.3)
Chloride: 113 mmol/L — ABNORMAL HIGH (ref 98–111)
Creatinine, Ser: 0.67 mg/dL (ref 0.61–1.24)
GFR calc Af Amer: >60 mL/min (ref >60)
GFR calc non Af Amer: >60 mL/min (ref >60)
Glucose, Bld: 88 mg/dL (ref 70–99)
Potassium: 5.3 mmol/L — ABNORMAL HIGH (ref 3.5–5.1)
Sodium: 140 mmol/L (ref 135–145)

## 2019-09-05 LAB — ALBUMIN, PLEURAL OR PERITONEAL FLUID: Albumin, Fluid: <1 g/dL

## 2019-09-05 LAB — MISC LABCORP TEST (SEND OUT): Labcorp test code: 520057

## 2019-09-05 LAB — GLUCOSE, PLEURAL OR PERITONEAL FLUID: Glucose, Fluid: 90 mg/dL

## 2019-09-05 LAB — POTASSIUM: Potassium: 5.3 mmol/L — ABNORMAL HIGH (ref 3.5–5.1)

## 2019-09-05 LAB — PHOSPHORUS: Phosphorus: 3.2 mg/dL (ref 2.5–4.6)

## 2019-09-05 LAB — INSULIN-LIKE GROWTH FACTOR: Somatomedin C: 67 ng/mL (ref 40–194)

## 2019-09-05 LAB — MAGNESIUM: Magnesium: 1.6 mg/dL — ABNORMAL LOW (ref 1.7–2.4)

## 2019-09-05 MED ORDER — MAGNESIUM SULFATE 2 GM/50ML IV SOLN
2.0000 g | Freq: Once | INTRAVENOUS | Status: AC
  Administered 2019-09-05: 2 g via INTRAVENOUS
  Filled 2019-09-05: qty 50

## 2019-09-05 MED ORDER — ZINC OXIDE 40 % EX OINT
TOPICAL_OINTMENT | Freq: Three times a day (TID) | CUTANEOUS | Status: DC | PRN
  Filled 2019-09-05: qty 113

## 2019-09-05 MED ORDER — SODIUM ZIRCONIUM CYCLOSILICATE 10 G PO PACK
10.0000 g | PACK | Freq: Every day | ORAL | Status: DC
  Administered 2019-09-06: 10 g
  Filled 2019-09-05 (×6): qty 1

## 2019-09-05 NOTE — Progress Notes (Signed)
PROGRESS NOTE    Albert [name]  JXB:[id number] DOB: 1933/04/18 DOA: 08/24/2019   PCP: [name], MD    Brief Narrative: Albert [name] is 86 y.o. male with PMH of CKD, HTN, recent GI bleed presents from SNF for fever and hypotension. He is admitted for sepsis secondary to possible aspiration pneumonia and C. difficile colitis.  He completed antibiotics for Aspiration pneumonia. He is on PO vancomycin through NG tube. Given dysphagia he underwent thorough workup,  Neurology thinks it secondary to autoimmune myopathies but there is concern about myositis or dermatomyositis,  Neurology recommended Muscle biopsy  And close rheumatology Out patient follow-up.  Patient underwent  upper esophagoscopy with  esophageal sphincter dilatation. Palliative care consulted to discuss goals of care,  Patient want full scope of care.  Patient is at very high risk for aspiration,  Patient has a NG tube placed by IR.  He is found to have small PE on CT chest,  Venous duplex negative for DVT. Patient has hx. Of GI bleeding ,  Vascular Surgery consulted, he underwent IVC filter placement.  Patient underwent thoracocentesis for pleural effusion , cytology for diagnosis.  Plan: Patient will need PEG tube for nutrition,  Patient will require muscle biopsy for confirmation of diagnosis.   Assessment & Plan:   Active Problems:   Elevated troponin   Benign essential hypertension   CKD (chronic kidney disease), stage III   Sepsis (HCC)   Lactic acidosis   Anemia   Elevated brain natriuretic peptide (BNP) level   Aspiration pneumonia of right lower lobe due to gastric secretions (HCC)   Dysphagia   C. difficile colitis   Ulcer of esophagus without bleeding   Hypomagnesemia   Hypokalemia   Advanced care planning/counseling discussion   Goals of care, counseling/discussion   Autoimmune disease ([place])   Palliative care by specialist   1. Sepsis, present on admission with hypotension.  This has improved.   Completed treatment for Aspiration pneumonia with Unasyn (day 5 of 5),          C. difficile colitis on vancomycin via NG tube now.  DC IV Flagyl.  2.  C.difficile colitis.  Vancomycin via NG tube.  3. Dysphagia with failed swallow evaluation with aspiration on esophagram and modified barium swallow.        MRI : Findings may be concerning for myositis or myopathies.  Recommended labs and close outpatient rheumatology follow-up.      ENT; Patient underwent rigid esophagoscopy with dilatation of the stricture.  Tolerated well.        Interventional radiology can potentially do a PEG feeding tube after completion of treatment for C. difficile colitis.        Currently has NG tube. Resumed  tube feeding or trial of swallowing after EUS dilation.       Patient failed modified barium swallow, speech recommended n.p.o. alternate ways of feeding.        Palliative consulted to discuss goals of care. Patient wants full scope of care.      Plan:  Patient needs PEG tube after completion of treatment for C. difficile.  Continue NG tube for now.                Patient will need muscular biopsy for confirmation of diagnosis.  4.  Bilateral pleural effusion: Patient underwent thoracocentesis by IR today, tolerated well,  awaiting cytology report.  5.  Small PE: Patient is found to have small PE on CT chest,  venous duplex negative,  vascular Surgery consulted for IVC placement.          He underwent IVC filter placement , tolerated well.   4. Hyperlipidemia.  Hold atorvastatin.  5. Hypomagnesemia >>> Improved. recheck electrolytes tomorrow.  6. Hypokalemia  >>> Improved.  Potassium 5.3 today,  Lokelma  X 1 given recheck K  7. Chronic kidney disease stage II: Renal functions back to base line.   8. Iron deficiency anemia with prior blood loss, esophageal ulcer on recent endoscopy.  Hemoglobin on the lower side but stable.  9. Smudge cells seen on CBC.  Hematology consulted, recommended  outpatient   Workup . Repeat labs: No smudge cells.  10.  Esophageal ulcer and esophagitis on Protonix.   DVT prophylaxis: Lovenox Code Status:  Full Family Communication: No one at bed side, D/W patient. Disposition Plan: Dispo: The patient is from: Rehab  Anticipated d/c is to: Rehab  Anticipated d/c date is: Yet to be determined secondary the patient will have to have some way of getting nutrition  Patient currently being treated for aspiration pneumonia and C. difficile colitis.  Also failed swallow evaluation.              Palliative care consulted to discuss goals of care, patient wants full scope of treatment.              Plan : Family meeting completed, patient wants full scope of treatment.  Scheduled for PEG tube on Friday  Patient is not medically clear given ongoing work-up.  Consultants:    ENT, Neurology, cardiology  Procedures:    Antimicrobials:  Anti-infectives (From admission, onward)   Start     Dose/Rate Route Frequency Ordered Stop   09/05/19 0000  ceFAZolin (ANCEF) IVPB 2g/100 mL premix       Note to Pharmacy: To be given in specials   2 g 200 mL/hr over 30 Minutes Intravenous  Once 09/04/19 1146 09/04/19 2215   08/29/19 1800  vancomycin (VANCOCIN) 50 mg/mL oral solution 125 mg        125 mg Per Tube 4 times daily 08/29/19 1641 09/08/19 1759   08/28/19 1500  vancomycin (VANCOCIN) 500 mg in sodium chloride irrigation 0.9 % 100 mL ENEMA  Status:  Discontinued        500 mg Rectal Every 6 hours 08/28/19 1340 08/29/19 1641   08/28/19 1500  metroNIDAZOLE (FLAGYL) IVPB 500 mg  Status:  Discontinued        500 mg 100 mL/hr over 60 Minutes Intravenous Every 8 hours 08/28/19 1341 08/29/19 1642   08/26/19 1400  vancomycin (VANCOCIN) 50 mg/mL oral solution 125 mg  Status:  Discontinued        125 mg Oral 4 times daily 08/26/19 1255 08/28/19 1340   08/25/19 2000  vancomycin (VANCOCIN) IVPB 1000 mg/200 mL premix  Status:  Discontinued         1,000 mg 200 mL/hr over 60 Minutes Intravenous Every 24 hours 08/25/19 0240 08/25/19 1440   08/25/19 2000  doxycycline (VIBRA-TABS) tablet 100 mg  Status:  Discontinued        100 mg Oral Every 12 hours 08/25/19 1440 08/26/19 1254   08/25/19 1800  Ampicillin-Sulbactam (UNASYN) 3 g in sodium chloride 0.9 % 100 mL IVPB        3 g 200 mL/hr over 30 Minutes Intravenous Every 6 hours 08/25/19 1605 08/29/19 2336   08/25/19 1000  ceFEPIme (MAXIPIME) 2 g in sodium chloride 0.9 % 100  mL IVPB  Status:  Discontinued        2 g 200 mL/hr over 30 Minutes Intravenous Every 12 hours 08/25/19 0731 08/25/19 1548   08/25/19 0900  aztreonam (AZACTAM) 1 g in sodium chloride 0.9 % 100 mL IVPB  Status:  Discontinued        1 g 200 mL/hr over 30 Minutes Intravenous Every 8 hours 08/25/19 0228 08/25/19 0729   08/25/19 0015  aztreonam (AZACTAM) 2 g in sodium chloride 0.9 % 100 mL IVPB        2 g 200 mL/hr over 30 Minutes Intravenous  Once 08/25/19 0014 08/25/19 0115   08/25/19 0015  metroNIDAZOLE (FLAGYL) IVPB 500 mg  Status:  Discontinued        500 mg 100 mL/hr over 60 Minutes Intravenous Every 8 hours 08/25/19 0014 08/25/19 1548   08/25/19 0015  vancomycin (VANCOCIN) IVPB 1000 mg/200 mL premix        1,000 mg 200 mL/hr over 60 Minutes Intravenous  Once 08/25/19 0014 08/25/19 0222   08/24/19 2145  ceFEPIme (MAXIPIME) 2 g in sodium chloride 0.9 % 100 mL IVPB        2 g 200 mL/hr over 30 Minutes Intravenous  Once 08/24/19 2134 08/24/19 2222   08/24/19 2145  vancomycin (VANCOCIN) IVPB 1000 mg/200 mL premix        1,000 mg 200 mL/hr over 60 Minutes Intravenous  Once 08/24/19 2134 08/24/19 2251     Subjective: Patient was seen and examined at bedside, Overnight events noted.  Patient denies any difficulty breathing.   He is getting feeds and medications through the NG tube.He has IVC filter yesterday,  today he underwent thoracocentesis.   Objective: Vitals:   09/05/19 0408 09/05/19 0500 09/05/19 0749  09/05/19 1451  BP: (!) 139/93  (!) 124/56   Pulse: 91  89 (!) 55  Resp: 20  19   Temp: 98.4 F (36.9 C)  97.7 F (36.5 C)   TempSrc: Oral  Oral   SpO2: 99%  97% 95%  Weight:  74.8 kg    Height:        Intake/Output Summary (Last 24 hours) at 09/05/2019 1607 Last data filed at 09/05/2019 1239 Gross per 24 hour  Intake 1984.62 ml  Output 800 ml  Net 1184.62 ml   Filed Weights   09/03/19 0548 09/04/19 0349 09/05/19 0500  Weight: 78.7 kg 78.2 kg 74.8 kg    Examination:  General exam: Appears calm and comfortable , NG tube in Rt nostril Respiratory system: Clear to auscultation. Respiratory effort normal. Cardiovascular system: S1 & S2 heard, RRR. No JVD, murmurs, rubs, gallops or clicks. No pedal edema. Gastrointestinal system: Abdomen is nondistended, soft and nontender. No organomegaly or masses felt. Normal bowel sounds heard. Central nervous system: Alert and oriented. No focal neurological deficits. Extremities: No leg edema, no cyanosis. Skin: No rashes, lesions or ulcers Psychiatry: Judgement and insight appear normal. Mood & affect appropriate.     Data Reviewed: I have personally reviewed following labs and imaging studies  CBC: Recent Labs  Lab 09/01/19 0526 09/02/19 0642 09/03/19 0600 09/04/19 0429 09/05/19 0434  WBC 8.0 8.2 11.4* 9.7 9.4  HGB 8.9* 9.3* 9.7* 9.1* 9.4*  HCT 29.2* 29.7* 30.6* 29.7* 29.2*  MCV 93.0 91.4 89.7 92.5 88.2  PLT 216 189 208 163 161   Basic Metabolic Panel: Recent Labs  Lab 08/31/19 0604 08/31/19 0604 09/01/19 0526 09/01/19 0526 09/02/19 0960 09/03/19 0600 09/03/19 0841 09/04/19 0429 09/05/19  3734 09/05/19 0859  NA 151*   < > 151*  --  145 142  --  141 140  --   K 3.8   < > 4.0   < > 4.4 4.5  --  5.1 5.3* 5.3*  CL 122*   < > 122*  --  120* 116*  --  115* 113*  --   CO2 19*   < > 21*  --  18* 21*  --  22 23  --   GLUCOSE 86   < > 141*  --  130* 103*  --  108* 88  --   BUN 19   < > 18  --  21 20  --  20 18  --     CREATININE 1.14   < > 1.06  --  1.03 0.80  --  0.72 0.67  --   CALCIUM 7.6*   < > 7.7*  --  7.4* 7.3*  --  7.3* 7.2*  --   MG 1.8   < > 1.7  --  1.5*  --  2.1 1.7 1.6*  --   PHOS 3.7  --  3.4  --  3.1  --   --  3.0 3.2  --    < > = values in this interval not displayed.   GFR: Estimated Creatinine Clearance: 63.6 mL/min (by C-G formula based on SCr of 0.67 mg/dL). Liver Function Tests: Recent Labs  Lab 08/30/19 1403  AST 36  ALT 14  ALKPHOS 58  BILITOT 0.8  PROT 5.0*  ALBUMIN 1.8*   No results for input(s): LIPASE, AMYLASE in the last 168 hours. No results for input(s): AMMONIA in the last 168 hours. Coagulation Profile: No results for input(s): INR, PROTIME in the last 168 hours. Cardiac Enzymes: No results for input(s): CKTOTAL, CKMB, CKMBINDEX, TROPONINI in the last 168 hours. BNP (last 3 results) No results for input(s): PROBNP in the last 8760 hours. HbA1C: No results for input(s): HGBA1C in the last 72 hours. CBG: No results for input(s): GLUCAP in the last 168 hours. Lipid Profile: No results for input(s): CHOL, HDL, LDLCALC, TRIG, CHOLHDL, LDLDIRECT in the last 72 hours. Thyroid Function Tests: No results for input(s): TSH, T4TOTAL, FREET4, T3FREE, THYROIDAB in the last 72 hours. Anemia Panel: No results for input(s): VITAMINB12, FOLATE, FERRITIN, TIBC, IRON, RETICCTPCT in the last 72 hours. Sepsis Labs: No results for input(s): PROCALCITON, LATICACIDVEN in the last 168 hours.  No results found for this or any previous visit (from the past 240 hour(s)).   Radiology Studies: PERIPHERAL VASCULAR CATHETERIZATION  Result Date: 09/04/2019 See op note  US THORACENTESIS ASP PLEURAL SPACE W/IMG GUIDE  Result Date: 09/05/2019 INDICATION: 86 year old with multiple medical problems including pleural effusions. Request for thoracentesis. EXAM: ULTRASOUND GUIDED RIGHT THORACENTESIS MEDICATIONS: None. COMPLICATIONS: None immediate. PROCEDURE: An ultrasound guided  thoracentesis was thoroughly discussed with the patient and questions answered. The benefits, risks, alternatives and complications were also discussed. The patient understands and wishes to proceed with the procedure. Written consent was obtained. Ultrasound was performed to localize and mark an adequate pocket of fluid in the right chest. The area was then prepped and draped in the normal sterile fashion. 1% Lidocaine was used for local anesthesia. Under ultrasound guidance a 6 Fr Safe-T-Centesis catheter was introduced. Thoracentesis was performed. The catheter was removed and a dressing applied. FINDINGS: A total of approximately 1.1 L of yellow fluid was removed. Samples were sent to the laboratory as requested by the  clinical team. IMPRESSION: Successful ultrasound guided right thoracentesis yielding 1.1 L of pleural fluid. Electronically Signed   By: Markus Daft M.D.   On: 09/05/2019 15:58    Scheduled Meds: . enoxaparin (LOVENOX) injection  40 mg Subcutaneous Q24H  . feeding supplement (PROSource TF)  45 mL Per Tube Daily  . free water  100 mL Per Tube Q4H  . metoprolol tartrate  2.5 mg Intravenous Q12H  . pantoprazole (PROTONIX) IV  40 mg Intravenous Q24H  . sodium zirconium cyclosilicate  10 g Per Tube Daily  . vancomycin  125 mg Per Tube QID   Continuous Infusions: . sodium chloride 250 mL (08/29/19 0621)  . dextrose 5 % and 0.45% NaCl 50 mL/hr at 09/05/19 1239  . feeding supplement (OSMOLITE 1.5 CAL) 50 mL/hr at 09/05/19 0348     LOS: 11 days    Time spent: 35 mins.   Shawna Clamp, MD [facility]   If 7PM-7AM, please contact night-coverage

## 2019-09-05 NOTE — Progress Notes (Signed)
Occupational Therapy Treatment Patient Details Name: Albert [name] MRN: [id number] DOB: Nov 15, 1933 Today's Date: 09/05/2019    History of present illness Albert [name] is 86 y.o. male with PMH of CKD, HTN, HLD with recent admission for GI bleed and urosepsis presents from SNF with concerns for fever and hypotensive. Admitted for sepsis and aspiration pneunonia. Also with C. diff. Wide-complex tachycardia secondary to sepsis. Several recent hospitalizations.   OT comments  Pt seen for OT treatment this date to f/u re: safety with ADLs/ADL mobility. Of note: K+ slightly elevated this date, OT monitors HR/rhythm throughout, maintains 70s-80bpms at rest, 110s with activity. Pt performs sup to sit transition with MOD A and HOB elevated. Pt tolerates EOB sitting x~25 mins while completing self care tasks including MIN A for washing face and MAX to TOTAL A for LB dressing-donning socks-in sitting. Pt requires intermittent MIN A to correct posture for more upright/midline sitting position. Pt tolerates well and states that he feels sitting up greatly helped reduce his pain. He has far less c/o pain with sup to sit (requiring MAX A to manage LEs) at end of session and reports his back feels better as well. Pt continues to progress benefit from skilled OT. Will continue to follow and continue to recommend SNF as most appropriate d/c recommendation as pt continues to demo low fxl activity tolerance.     Follow Up Recommendations  SNF    Equipment Recommendations  Other (comment)    Recommendations for Other Services      Precautions / Restrictions Precautions Precautions: Fall;Other (comment) Precaution Comments: C.Diff Restrictions Other Position/Activity Restrictions: skin rash/sensitivity       Mobility Bed Mobility Overal bed mobility: Needs Assistance Bed Mobility: Rolling     Supine to sit: Mod assist;HOB elevated Sit to supine: Max assist   General bed mobility comments: assist to  manage LEs, extended time.  Transfers                      Balance Overall balance assessment: Needs assistance Sitting-balance support: Feet unsupported;Bilateral upper extremity supported Sitting balance-Leahy Scale: Fair Sitting balance - Comments: sat ~25 mins with pillow support under L UE and use of rail on R side to sustain static sitting balance. Pt encouraged to extend posture within his tolerance, but pt noted to have head FWD and DOWN most of the time.                                   ADL either performed or assessed with clinical judgement   ADL Overall ADL's : Needs assistance/impaired                                       General ADL Comments: MIN A for seated UB ADLs including washing face, TOTAL A to don socks in sitting.     Vision Baseline Vision/History: Wears glasses Wears Glasses: At all times Patient Visual Report: No change from baseline     Perception     Praxis      Cognition Arousal/Alertness: Awake/alert Behavior During Therapy: WFL for tasks assessed/performed Overall Cognitive Status: Within Functional Limits for tasks assessed  General Comments: somewhat groggy at first, but increased attn and energy once assisted to seated position        Exercises Other Exercises Other Exercises: OT facilitates ed re: importance of OOB and pt agreeable to sitting EOB with therapist at this time to improve OOB tolerance. Pt tolerates ~25 mins in sitting while completing ADLs and working on static sitting balance d/t generally requiring UE support.   Shoulder Instructions       General Comments      Pertinent Vitals/ Pain       Pain Assessment: No/denies pain Faces Pain Scale: Hurts even more Pain Location: back/skin, swollen feet. Pain Descriptors / Indicators: Grimacing;Tender Pain Intervention(s): Limited activity within patient's tolerance;Monitored during  session;Repositioned  Home Living                                          Prior Functioning/Environment              Frequency  Min 1X/week        Progress Toward Goals  OT Goals(current goals can now be found in the care plan section)  Progress towards OT goals: Progressing toward goals  Acute Rehab OT Goals Patient Stated Goal: go back to rehab to get stronger before returning home OT Goal Formulation: With patient Time For Goal Achievement: 09/10/19 Potential to Achieve Goals: Good  Plan Discharge plan remains appropriate    Co-evaluation                 AM-PAC OT "6 Clicks" Daily Activity     Outcome Measure   Help from another person eating meals?: A Little Help from another person taking care of personal grooming?: A Little Help from another person toileting, which includes using toliet, bedpan, or urinal?: A Lot Help from another person bathing (including washing, rinsing, drying)?: A Lot Help from another person to put on and taking off regular upper body clothing?: A Lot Help from another person to put on and taking off regular lower body clothing?: A Lot 6 Click Score: 14    End of Session    OT Visit Diagnosis: Other abnormalities of gait and mobility (R26.89);Muscle weakness (generalized) (M62.81);Pain Pain - part of body:  (feet with swelling, sensitive to the touch)   Activity Tolerance Patient limited by fatigue;Patient limited by pain   Patient Left in bed;with call bell/phone within reach;with bed alarm set;with nursing/sitter in room   Nurse Communication Mobility status        Time: 3845-3646 OT Time Calculation (min): 54 min  Charges: OT General Charges $OT Visit: 1 Visit OT Treatments $Self Care/Home Management : 23-37 mins $Therapeutic Activity: 23-37 mins  Gerrianne Scale, MS, OTR/L ascom [phone] 09/05/19, 3:36 PM

## 2019-09-05 NOTE — Progress Notes (Signed)
 Vein & Vascular Surgery Daily Progress Note   Subjective: (09/04/19) 1.   Ultrasound guidance for vascular access to the right femoral vein 2.   Catheter placement into the inferior vena cava 3.   Inferior venacavogram 4.   Placement of a [employer] [place] IVC filter  Patient is somnolent this AM.  No issues overnight.  Denies any right groin issues.  Objective: Vitals:   09/05/19 0300 09/05/19 0408 09/05/19 0500 09/05/19 0749  BP:  (!) 139/93  (!) 124/56  Pulse:  91  89  Resp: 14 20  19   Temp:  98.4 F (36.9 C)  97.7 F (36.5 C)  TempSrc:  Oral  Oral  SpO2:  99%  97%  Weight:   74.8 kg   Height:        Intake/Output Summary (Last 24 hours) at 09/05/2019 1005 Last data filed at 09/05/2019 0408 Gross per 24 hour  Intake 1543.33 ml  Output 800 ml  Net 743.33 ml   Physical Exam: A&Ox3, NAD CV: RRR Pulmonary: CTA Bilaterally Abdomen: Soft, Non-tender, Non-distended  NG: intact Right Groin:  Access Site: Procedure dressing clean and intact.  No swelling or drainage noted. Vascular: Bilateral legs are warm distally to toes, minimal edema   Laboratory: CBC    Component Value Date/Time   WBC 9.4 09/05/2019 0434   HGB 9.4 (L) 09/05/2019 0434   HGB 13.0 02/09/2014 0947   HCT 29.2 (L) 09/05/2019 0434   HCT 38.0 (L) 02/09/2014 0947   PLT 177 09/05/2019 0434   PLT 149 (L) 02/09/2014 0947   BMET    Component Value Date/Time   NA 140 09/05/2019 0434   K 5.3 (H) 09/05/2019 0859   CL 113 (H) 09/05/2019 0434   CO2 23 09/05/2019 0434   GLUCOSE 88 09/05/2019 0434   BUN 18 09/05/2019 0434   CREATININE 0.67 09/05/2019 0434   CREATININE 1.27 04/29/2011 0959   CALCIUM 7.2 (L) 09/05/2019 0434   GFRNONAA >60 09/05/2019 0434   GFRNONAA 55 (L) 04/29/2011 0959   GFRAA >60 09/05/2019 0434   GFRAA >60 04/29/2011 0959   Assessment/Planning: Patient is an 86 year old male with multiple medical issues / complicated hospital course s/p IVC filter placement for PE /  immobility  1) right groin access site healing well without drainage or swelling  2) we will see the patient in our clinic 8 weeks to surveilled IVC filter / possibly discuss removal  3) vascular surgery will sign off at this time.  If needed, please reconsult  Discussed with Dr. Ellis Parents [facility] PA-C 09/05/2019 10:05 AM

## 2019-09-05 NOTE — Progress Notes (Signed)
Gleason visited pt. at suggestion of both OT and RN; pt. lying in bed watching TV, on enteric precautions for C-dif per RN.  Pt. seemed agreeable to visit, but also seemed fatigued and was somewhat slow in answering questions.  He shared that he has been here since July 31st and that he has several medical issues being treated.  Thornton asked pt. if his NG tube has been bothering him, and he says that it hasn't --it was only uncomfortable when it was initially placed.  Pt. shared no needs at this time, but [place] will continue to monitor.

## 2019-09-05 NOTE — Progress Notes (Signed)
PT Cancellation Note  Patient Details Name: Albert [name] MRN: [id number] DOB: 03/19/1933   Cancelled Treatment:    Reason Eval/Treat Not Completed: Other (comment). Consult received and chart reviewed. Patient noted with critically elevated K+ (5.3) contraindicated for exertional activity at this time.  Will continue efforts pending medical stability and appropriateness.  Lieutenant Diego PT, DPT 8:29 AM,09/05/19

## 2019-09-05 NOTE — Procedures (Signed)
Interventional Radiology Procedure:   Indications: Pleural effusions  Procedure: US guided thoracentesis  Findings: Removed 1100 ml from right chest.  Complications: None     EBL: Less than 10 ml  Plan: Follow up CXR   Radley Barto R. Anselm Pancoast, MD  Pager: [phone]

## 2019-09-06 DIAGNOSIS — E875 Hyperkalemia: Secondary | ICD-10-CM

## 2019-09-06 LAB — CBC
HCT: 28 % — ABNORMAL LOW (ref 39.0–52.0)
Hemoglobin: 8.7 g/dL — ABNORMAL LOW (ref 13.0–17.0)
MCH: 28.4 pg (ref 26.0–34.0)
MCHC: 31.1 g/dL (ref 30.0–36.0)
MCV: 91.5 fL (ref 80.0–100.0)
Platelets: 157 10*3/uL (ref 150–400)
RBC: 3.06 MIL/uL — ABNORMAL LOW (ref 4.22–5.81)
RDW: 18.9 % — ABNORMAL HIGH (ref 11.5–15.5)
WBC: 10.1 10*3/uL (ref 4.0–10.5)
nRBC: 0 % (ref 0.0–0.2)

## 2019-09-06 LAB — BASIC METABOLIC PANEL
Anion gap: 6 (ref 5–15)
BUN: 19 mg/dL (ref 8–23)
CO2: 21 mmol/L — ABNORMAL LOW (ref 22–32)
Calcium: 7.5 mg/dL — ABNORMAL LOW (ref 8.9–10.3)
Chloride: 110 mmol/L (ref 98–111)
Creatinine, Ser: 0.7 mg/dL (ref 0.61–1.24)
GFR calc Af Amer: >60 mL/min (ref >60)
GFR calc non Af Amer: >60 mL/min (ref >60)
Glucose, Bld: 127 mg/dL — ABNORMAL HIGH (ref 70–99)
Potassium: 5.4 mmol/L — ABNORMAL HIGH (ref 3.5–5.1)
Sodium: 137 mmol/L (ref 135–145)

## 2019-09-06 LAB — CORTISOL, URINE, FREE: Cortisol,F,ug/L,U: 72 ug/L

## 2019-09-06 LAB — PHOSPHORUS: Phosphorus: 3.5 mg/dL (ref 2.5–4.6)

## 2019-09-06 LAB — MAGNESIUM: Magnesium: 1.9 mg/dL (ref 1.7–2.4)

## 2019-09-06 MED ORDER — SODIUM CHLORIDE 0.9 % IV SOLN
500.0000 mg | Freq: Two times a day (BID) | INTRAVENOUS | Status: DC
  Administered 2019-09-06 – 2019-09-11 (×9): 500 mg via INTRAVENOUS
  Filled 2019-09-06 (×12): qty 4

## 2019-09-06 NOTE — Progress Notes (Signed)
Patient refused to be moved over to air mattress bed. Will continue to monitor as turned as needed.

## 2019-09-06 NOTE — Progress Notes (Addendum)
Physical Therapy Treatment Patient Details Name: Albert [name] MRN: [id number] DOB: December 23, 1933 Today's Date: 09/06/2019    History of Present Illness Albert [name] is 86 y.o. male with PMH of CKD, HTN, HLD with recent admission for GI bleed and urosepsis presents from SNF with concerns for fever and hypotensive. Admitted for sepsis and aspiration pneunonia. Also with C. diff. Wide-complex tachycardia secondary to sepsis. Pt also underwent IVC filter placement for PE, NG tube placed as well. Several recent hospitalizations.    PT Comments    Pt alert, agreeable to bed exercises. Pt with elevated K+, spoke with Dr. Manuella Ghazi and agreeable to bed level session today. Pt able to perform some exercises without physical assist, instructed to perform ankle pumps, quad sets, and glute squeezes at least 3 times a day, pt verbalized understanding. Assistance needed for heel slides, hip abduction, and SLR. Pt motivated to participate as much as possible. Pt repositioned for comfort, all needs in reach. The patient would benefit from further skilled PT intervention to continue to progress towards goals. Recommendation remains appropriate.     Follow Up Recommendations  SNF     Equipment Recommendations  None recommended by PT    Recommendations for Other Services OT consult     Precautions / Restrictions Precautions Precautions: Fall Precaution Comments: C.Diff Restrictions Weight Bearing Restrictions: No Other Position/Activity Restrictions: skin rash/sensitivity    Mobility  Bed Mobility               General bed mobility comments: deferred due to elevated K+  Transfers                    Ambulation/Gait                 Stairs             Wheelchair Mobility    Modified Rankin (Stroke Patients Only)       Balance                                            Cognition Arousal/Alertness: Awake/alert Behavior During Therapy: WFL for  tasks assessed/performed Overall Cognitive Status: Within Functional Limits for tasks assessed                                        Exercises General Exercises - Lower Extremity Ankle Circles/Pumps: AROM;15 reps;Both Quad Sets: AROM;Both;15 reps;Strengthening Gluteal Sets: AROM;Both;15 reps;Strengthening Short Arc Quad: AROM;Strengthening;Both;15 reps Heel Slides: AROM;Strengthening;Both;15 reps Hip ABduction/ADduction: AROM;Strengthening;Both;15 reps Straight Leg Raises: AAROM;Strengthening;Both;15 reps    General Comments        Pertinent Vitals/Pain Pain Assessment: No/denies pain    Home Living                      Prior Function            PT Goals (current goals can now be found in the care plan section) Progress towards PT goals: Progressing toward goals    Frequency    Min 2X/week      PT Plan Current plan remains appropriate    Co-evaluation              AM-PAC PT "6 Clicks" Mobility   Outcome Measure  Help needed turning from  your back to your side while in a flat bed without using bedrails?: A Lot Help needed moving from lying on your back to sitting on the side of a flat bed without using bedrails?: A Lot Help needed moving to and from a bed to a chair (including a wheelchair)?: Total Help needed standing up from a chair using your arms (e.g., wheelchair or bedside chair)?: Total Help needed to walk in hospital room?: Total Help needed climbing 3-5 steps with a railing? : Total 6 Click Score: 8    End of Session   Activity Tolerance: Patient tolerated treatment well Patient left: with call bell/phone within reach;with bed alarm set;in bed Nurse Communication: Mobility status PT Visit Diagnosis: Other abnormalities of gait and mobility (R26.89);Muscle weakness (generalized) (M62.81);Difficulty in walking, not elsewhere classified (R26.2);Pain Pain - Right/Left: Right Pain - part of body: Knee     Time:  1520-1540 PT Time Calculation (min) (ACUTE ONLY): 20 min  Charges:  $Therapeutic Exercise: 8-22 mins                     Lieutenant Diego PT, DPT 3:53 PM,09/06/19

## 2019-09-06 NOTE — Progress Notes (Signed)
Pt had large BM, loose and pt cleansed and linens changed,  Pt yelled out in pain and is very agitated.  Np notified.

## 2019-09-06 NOTE — Progress Notes (Signed)
PROGRESS NOTE    Albert [name]  ZCH:[id number] DOB: 08-Mar-1933 DOA: 08/24/2019   PCP: [name], MD    Brief Narrative: Albert [name] is 86 y.o. male with PMH of CKD, HTN, recent GI bleed presents from SNF for fever and hypotension. He is admitted for sepsis secondary to possible aspiration pneumonia and C. difficile colitis.  He completed antibiotics for Aspiration pneumonia. He is on PO vancomycin through NG tube. Given dysphagia he underwent thorough workup,  Neurology thinks it secondary to autoimmune myopathies but there is concern about myositis or dermatomyositis,  Neurology recommended Muscle biopsy  And close rheumatology Out patient follow-up.  Patient underwent  upper esophagoscopy with  esophageal sphincter dilatation. Palliative care consulted to discuss goals of care,  Patient want full scope of care.  Patient is at very high risk for aspiration,  Patient has a NG tube placed by IR.  He is found to have small PE on CT chest,  Venous duplex negative for DVT. Patient has hx. Of GI bleeding ,  Vascular Surgery consulted, he underwent IVC filter placement.  Patient underwent thoracocentesis for pleural effusion , cytology for diagnosis.  Plan: Patient will need PEG tube for nutrition,  Patient will require muscle biopsy for confirmation of diagnosis.   Assessment & Plan:   Active Problems:   Elevated troponin   Benign essential hypertension   CKD (chronic kidney disease), stage III   Sepsis (HCC)   Lactic acidosis   Anemia   Elevated brain natriuretic peptide (BNP) level   Aspiration pneumonia of right lower lobe due to gastric secretions (HCC)   Dysphagia   C. difficile colitis   Ulcer of esophagus without bleeding   Hypomagnesemia   Hypokalemia   Advanced care planning/counseling discussion   Goals of care, counseling/discussion   Autoimmune disease ([place])   Palliative care by specialist   1. Sepsis, present on admission with hypotension.  This has improved.   Completed treatment for Aspiration pneumonia with Unasyn (day 5 of 5),          C. difficile colitis on vancomycin (day 9/10) via NG tube now.  DC IV Flagyl.  2.  C.difficile colitis.  Vancomycin via NG tube (day 9/10).  3. Dysphagia with failed swallow evaluation with aspiration on esophagram and modified barium swallow. 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  Recommended labs and close outpatient rheumatology follow-up.      ENT; Patient underwent rigid esophagoscopy with dilatation of the stricture.  Tolerated well.        Interventional radiology can potentially do a PEG feeding tube after completion of treatment for C. difficile colitis.        Currently has NG tube. Resumed  tube feeding or trial of swallowing after EUS dilation.       Patient failed modified barium swallow, speech recommended n.p.o. alternate ways of feeding.        Palliative consulted to discuss goals of care. Patient wants full scope of care.      Plan:  Patient needs PEG tube after completion of treatment for C. difficile.  Continue NG tube for now.                Patient will need muscular biopsy for confirmation of diagnosis. We will start IV methylprednisolone for 5 days per neurology  4.  Bilateral pleural effusion: Patient underwent thoracocentesis by IR today, tolerated well,  awaiting cytology report.  5.  Small PE: Patient is found to have small PE on CT chest, venous duplex negative,  vascular Surgery consulted for IVC placement.          He underwent IVC filter placement , tolerated well.   4. Hyperlipidemia.  Hold atorvastatin.  5. Hypomagnesemia >>> Improved. recheck electrolytes tomorrow.  6. Hyperkalemia  >>> potassium 5.4 today, will give Lokelma once and recheck potassium in the morning  7. Chronic kidney disease stage  II: Renal functions back to base line.   8. Iron deficiency anemia with prior blood loss, esophageal ulcer on recent endoscopy.  Hemoglobin on the lower side but stable.  9. Smudge cells seen on CBC.  Hematology consulted, recommended  outpatient  Workup . Repeat labs: No smudge cells.  10.  Esophageal ulcer and esophagitis on Protonix.   DVT prophylaxis: Lovenox Code Status:  Full Family Communication: No one at bed side, D/W patient. Disposition Plan: Dispo: The patient is from: Rehab  Anticipated d/c is to: Rehab  Anticipated d/c date is: Yet to be determined secondary the patient will have to have some way of getting nutrition  Patient currently being treated for aspiration pneumonia and C. difficile colitis.  Also failed swallow evaluation.              Palliative care consulted to discuss goals of care, patient wants full scope of treatment.              Plan : Family meeting completed, patient wants full scope of treatment.  Scheduled for PEG tube on Friday 9/3  Patient is not medically clear given ongoing work-up.  Consultants:    ENT, Neurology, cardiology  Procedures:    Antimicrobials:  Anti-infectives (From admission, onward)   Start     Dose/Rate Route Frequency Ordered Stop   09/05/19 0000  ceFAZolin (ANCEF) IVPB 2g/100 mL premix       Note to Pharmacy: To be given in specials   2 g 200 mL/hr over 30 Minutes Intravenous  Once 09/04/19 1146 09/04/19 2215   08/29/19 1800  vancomycin (VANCOCIN) 50 mg/mL oral solution 125 mg        125 mg Per Tube 4 times daily 08/29/19 1641 09/08/19 1759   08/28/19 1500  vancomycin (VANCOCIN) 500 mg in sodium chloride irrigation 0.9 % 100 mL ENEMA  Status:  Discontinued        500 mg Rectal Every 6 hours 08/28/19 1340 08/29/19 1641   08/28/19 1500  metroNIDAZOLE (FLAGYL) IVPB 500 mg  Status:  Discontinued        500 mg 100 mL/hr over 60 Minutes Intravenous Every 8 hours 08/28/19 1341 08/29/19  1642   08/26/19 1400  vancomycin (VANCOCIN) 50 mg/mL oral solution 125 mg  Status:  Discontinued        125 mg Oral 4 times daily 08/26/19 1255 08/28/19 1340   08/25/19 2000  vancomycin (VANCOCIN) IVPB 1000 mg/200 mL premix  Status:  Discontinued        1,000 mg 200 mL/hr over 60 Minutes Intravenous Every 24 hours 08/25/19 0240 08/25/19 1440   08/25/19 2000  doxycycline (VIBRA-TABS) tablet 100 mg  Status:  Discontinued        100 mg Oral Every 12 hours 08/25/19 1440 08/26/19 1254   08/25/19 1800  Ampicillin-Sulbactam (UNASYN) 3 g in sodium chloride 0.9 % 100 mL IVPB  3 g 200 mL/hr over 30 Minutes Intravenous Every 6 hours 08/25/19 1605 08/29/19 2336   08/25/19 1000  ceFEPIme (MAXIPIME) 2 g in sodium chloride 0.9 % 100 mL IVPB  Status:  Discontinued        2 g 200 mL/hr over 30 Minutes Intravenous Every 12 hours 08/25/19 0731 08/25/19 1548   08/25/19 0900  aztreonam (AZACTAM) 1 g in sodium chloride 0.9 % 100 mL IVPB  Status:  Discontinued        1 g 200 mL/hr over 30 Minutes Intravenous Every 8 hours 08/25/19 0228 08/25/19 0729   08/25/19 0015  aztreonam (AZACTAM) 2 g in sodium chloride 0.9 % 100 mL IVPB        2 g 200 mL/hr over 30 Minutes Intravenous  Once 08/25/19 0014 08/25/19 0115   08/25/19 0015  metroNIDAZOLE (FLAGYL) IVPB 500 mg  Status:  Discontinued        500 mg 100 mL/hr over 60 Minutes Intravenous Every 8 hours 08/25/19 0014 08/25/19 1548   08/25/19 0015  vancomycin (VANCOCIN) IVPB 1000 mg/200 mL premix        1,000 mg 200 mL/hr over 60 Minutes Intravenous  Once 08/25/19 0014 08/25/19 0222   08/24/19 2145  ceFEPIme (MAXIPIME) 2 g in sodium chloride 0.9 % 100 mL IVPB        2 g 200 mL/hr over 30 Minutes Intravenous  Once 08/24/19 2134 08/24/19 2222   08/24/19 2145  vancomycin (VANCOCIN) IVPB 1000 mg/200 mL premix        1,000 mg 200 mL/hr over 60 Minutes Intravenous  Once 08/24/19 2134 08/24/19 2251     Subjective: Patient was seen and examined at bedside,  Overnight events noted.  Patient denies any difficulty breathing.   He is getting feeds and medications through the NG tube.He has IVC filter yesterday,  today he underwent thoracocentesis.   Objective: Vitals:   09/05/19 2000 09/05/19 2238 09/06/19 0741 09/06/19 1203  BP:  125/60 135/73 (!) 105/53  Pulse:  88 94 85  Resp: 18  16   Temp:  98.5 F (36.9 C) 98 F (36.7 C) 97.9 F (36.6 C)  TempSrc:  Oral Oral Oral  SpO2:  96% 98%   Weight:      Height:        Intake/Output Summary (Last 24 hours) at 09/06/2019 1605 Last data filed at 09/06/2019 1533 Gross per 24 hour  Intake 4290.99 ml  Output 500 ml  Net 3790.99 ml   Filed Weights   09/03/19 0548 09/04/19 0349 09/05/19 0500  Weight: 78.7 kg 78.2 kg 74.8 kg    Examination:  General exam: Appears calm and comfortable , NG tube in Rt nostril Respiratory system: Clear to auscultation. Respiratory effort normal. Cardiovascular system: S1 & S2 heard, RRR. No JVD, murmurs, rubs, gallops or clicks. No pedal edema. Gastrointestinal system: Abdomen is nondistended, soft and nontender. No organomegaly or masses felt. Normal bowel sounds heard. Central nervous system: Alert and oriented. No focal neurological deficits. Extremities: No leg edema, no cyanosis. Skin: No rashes, lesions or ulcers Psychiatry: Judgement and insight appear normal. Mood & affect appropriate.     Data Reviewed: I have personally reviewed following labs and imaging studies  CBC: Recent Labs  Lab 09/02/19 0642 09/03/19 0600 09/04/19 0429 09/05/19 0434 09/06/19 0452  WBC 8.2 11.4* 9.7 9.4 10.1  HGB 9.3* 9.7* 9.1* 9.4* 8.7*  HCT 29.7* 30.6* 29.7* 29.2* 28.0*  MCV 91.4 89.7 92.5 88.2 91.5  PLT 189  208 163 177 242   Basic Metabolic Panel: Recent Labs  Lab 09/01/19 0526 09/01/19 0526 09/02/19 3536 09/02/19 0642 09/03/19 0600 09/03/19 0841 09/04/19 0429 09/05/19 0434 09/05/19 0859 09/06/19 0452  NA 151*   < > 145  --  142  --  141 140  --  137   K 4.0   < > 4.4   < > 4.5  --  5.1 5.3* 5.3* 5.4*  CL 122*   < > 120*  --  116*  --  115* 113*  --  110  CO2 21*   < > 18*  --  21*  --  22 23  --  21*  GLUCOSE 141*   < > 130*  --  103*  --  108* 88  --  127*  BUN 18   < > 21  --  20  --  20 18  --  19  CREATININE 1.06   < > 1.03  --  0.80  --  0.72 0.67  --  0.70  CALCIUM 7.7*   < > 7.4*  --  7.3*  --  7.3* 7.2*  --  7.5*  MG 1.7   < > 1.5*  --   --  2.1 1.7 1.6*  --  1.9  PHOS 3.4  --  3.1  --   --   --  3.0 3.2  --  3.5   < > = values in this interval not displayed.   GFR: Estimated Creatinine Clearance: 63.6 mL/min (by C-G formula based on SCr of 0.7 mg/dL). Liver Function Tests: No results for input(s): AST, ALT, ALKPHOS, BILITOT, PROT, ALBUMIN in the last 168 hours. No results for input(s): LIPASE, AMYLASE in the last 168 hours. No results for input(s): AMMONIA in the last 168 hours. Coagulation Profile: No results for input(s): INR, PROTIME in the last 168 hours. Cardiac Enzymes: No results for input(s): CKTOTAL, CKMB, CKMBINDEX, TROPONINI in the last 168 hours. BNP (last 3 results) No results for input(s): PROBNP in the last 8760 hours. HbA1C: No results for input(s): HGBA1C in the last 72 hours. CBG: No results for input(s): GLUCAP in the last 168 hours. Lipid Profile: No results for input(s): CHOL, HDL, LDLCALC, TRIG, CHOLHDL, LDLDIRECT in the last 72 hours. Thyroid Function Tests: No results for input(s): TSH, T4TOTAL, FREET4, T3FREE, THYROIDAB in the last 72 hours. Anemia Panel: No results for input(s): VITAMINB12, FOLATE, FERRITIN, TIBC, IRON, RETICCTPCT in the last 72 hours. Sepsis Labs: No results for input(s): PROCALCITON, LATICACIDVEN in the last 168 hours.  Recent Results (from the past 240 hour(s))  Body fluid culture     Status: None (Preliminary result)   Collection Time: 09/05/19  3:15 PM   Specimen: PATH Cytology Pleural fluid  Result Value Ref Range Status   Specimen Description   Final     PLEURAL Performed at [facility], [address] [place], [place] [address]    Special Requests   Final    NONE Performed at [facility], [address] [place], [place] [address]    Gram Stain   Final    FEW WBC PRESENT, PREDOMINANTLY MONONUCLEAR NO ORGANISMS SEEN    Culture   Final    NO GROWTH < 24 HOURS Performed at [facility] [address], [place], [place] [address]    Report Status PENDING  Incomplete     Radiology Studies: DG Chest Port 1 View  Result Date: 09/05/2019 CLINICAL DATA:  Post right thoracentesis EXAM: PORTABLE CHEST 1 VIEW COMPARISON:  CT chest 09/02/2019, radiograph 08/24/2019 FINDINGS: Small residual right effusion. Small to moderate layering left effusion is present as well. Mixed hazy and streaky opacities in the lung bases could reflect some residual atelectatic change and/or re-expansion edematous features. No pneumothorax. Stable cardiomegaly with a calcified, tortuous aorta. No acute osseous or soft tissue abnormality. Degenerative changes are present in the imaged spine and shoulders. Telemetry leads and nasal cannula overlie the chest. Transesophageal tube remains in place with the tip distal to the GE junction, below the margin of imaging. IMPRESSION: 1. Small residual right effusion. Small to moderate layering left effusion. 2. No pneumothorax. 3. Mixed hazy and streaky opacities in the lung bases most likely reflect some residual atelectatic change and/or re-expansion edematous features. Infection/consolidation cannot be fully excluded. Electronically Signed   By: Lovena Le M.D.   On: 09/05/2019 16:07   US THORACENTESIS ASP PLEURAL SPACE W/IMG GUIDE  Result Date: 09/05/2019 INDICATION: 86 year old with multiple medical problems including pleural effusions. Request for thoracentesis. EXAM: ULTRASOUND GUIDED RIGHT THORACENTESIS MEDICATIONS: None. COMPLICATIONS: None immediate. PROCEDURE: An ultrasound guided thoracentesis  was thoroughly discussed with the patient and questions answered. The benefits, risks, alternatives and complications were also discussed. The patient understands and wishes to proceed with the procedure. Written consent was obtained. Ultrasound was performed to localize and mark an adequate pocket of fluid in the right chest. The area was then prepped and draped in the normal sterile fashion. 1% Lidocaine was used for local anesthesia. Under ultrasound guidance a 6 Fr Safe-T-Centesis catheter was introduced. Thoracentesis was performed. The catheter was removed and a dressing applied. FINDINGS: A total of approximately 1.1 L of yellow fluid was removed. Samples were sent to the laboratory as requested by the clinical team. IMPRESSION: Successful ultrasound guided right thoracentesis yielding 1.1 L of pleural fluid. Electronically Signed   By: Markus Daft M.D.   On: 09/05/2019 15:58    Scheduled Meds: . enoxaparin (LOVENOX) injection  40 mg Subcutaneous Q24H  . feeding supplement (PROSource TF)  45 mL Per Tube Daily  . free water  100 mL Per Tube Q4H  . metoprolol tartrate  2.5 mg Intravenous Q12H  . pantoprazole (PROTONIX) IV  40 mg Intravenous Q24H  . sodium zirconium cyclosilicate  10 g Per Tube Daily  . vancomycin  125 mg Per Tube QID   Continuous Infusions: . sodium chloride 250 mL (08/29/19 0621)  . dextrose 5 % and 0.45% NaCl 50 mL/hr at 09/06/19 1533  . feeding supplement (OSMOLITE 1.5 CAL) 1,000 mL (09/06/19 1417)  . methylPREDNISolone (SOLU-MEDROL) injection Stopped (09/06/19 1231)     LOS: 12 days    Time spent: 35 mins.   Max Sane, MD [facility]   If 7PM-7AM, please contact night-coverage

## 2019-09-07 ENCOUNTER — Inpatient Hospital Stay: Payer: Medicare Other

## 2019-09-07 DIAGNOSIS — R29898 Other symptoms and signs involving the musculoskeletal system: Secondary | ICD-10-CM

## 2019-09-07 LAB — CBC
HCT: 27.7 % — ABNORMAL LOW (ref 39.0–52.0)
Hemoglobin: 9.1 g/dL — ABNORMAL LOW (ref 13.0–17.0)
MCH: 28.6 pg (ref 26.0–34.0)
MCHC: 32.9 g/dL (ref 30.0–36.0)
MCV: 87.1 fL (ref 80.0–100.0)
Platelets: 114 10*3/uL — ABNORMAL LOW (ref 150–400)
RBC: 3.18 MIL/uL — ABNORMAL LOW (ref 4.22–5.81)
RDW: 18.5 % — ABNORMAL HIGH (ref 11.5–15.5)
WBC: 3.5 10*3/uL — ABNORMAL LOW (ref 4.0–10.5)
nRBC: 0 % (ref 0.0–0.2)

## 2019-09-07 LAB — BASIC METABOLIC PANEL
Anion gap: 6 (ref 5–15)
BUN: 21 mg/dL (ref 8–23)
CO2: 22 mmol/L (ref 22–32)
Calcium: 7.5 mg/dL — ABNORMAL LOW (ref 8.9–10.3)
Chloride: 108 mmol/L (ref 98–111)
Creatinine, Ser: 0.74 mg/dL (ref 0.61–1.24)
GFR calc Af Amer: >60 mL/min (ref >60)
GFR calc non Af Amer: >60 mL/min (ref >60)
Glucose, Bld: 159 mg/dL — ABNORMAL HIGH (ref 70–99)
Potassium: 4.7 mmol/L (ref 3.5–5.1)
Sodium: 136 mmol/L (ref 135–145)

## 2019-09-07 LAB — COMP PANEL: LEUKEMIA/LYMPHOMA

## 2019-09-07 LAB — CYTOLOGY - NON PAP

## 2019-09-07 LAB — ACID FAST SMEAR (AFB, MYCOBACTERIA): Acid Fast Smear: NEGATIVE

## 2019-09-07 LAB — PH, BODY FLUID: pH, Body Fluid: 7.9

## 2019-09-07 MED ORDER — PANCRELIPASE (LIP-PROT-AMYL) 10440-39150 UNITS PO TABS
20880.0000 [IU] | ORAL_TABLET | Freq: Once | ORAL | Status: AC
  Administered 2019-09-07: 20880 [IU]
  Filled 2019-09-07 (×2): qty 2

## 2019-09-07 MED ORDER — MORPHINE SULFATE (PF) 2 MG/ML IV SOLN
2.0000 mg | Freq: Once | INTRAVENOUS | Status: AC
  Administered 2019-09-07: 2 mg via INTRAVENOUS
  Filled 2019-09-07: qty 1

## 2019-09-07 MED ORDER — SODIUM BICARBONATE 650 MG PO TABS
650.0000 mg | ORAL_TABLET | Freq: Once | ORAL | Status: AC
  Administered 2019-09-07: 650 mg
  Filled 2019-09-07: qty 1

## 2019-09-07 MED ORDER — CEFAZOLIN SODIUM-DEXTROSE 2-4 GM/100ML-% IV SOLN
2.0000 g | INTRAVENOUS | Status: AC
  Administered 2019-09-08: 2 g via INTRAVENOUS
  Filled 2019-09-07: qty 100

## 2019-09-07 NOTE — Therapy (Signed)
NIF:  -30 CMH20

## 2019-09-07 NOTE — Progress Notes (Signed)
Physical Therapy Treatment Patient Details Name: Albert [name] MRN: [id number] DOB: 17-Jun-1933 Today's Date: 09/07/2019    History of Present Illness Albert [name] is 86 y.o. male with PMH of CKD, HTN, HLD with recent admission for GI bleed and urosepsis presents from SNF with concerns for fever and hypotensive. Admitted for sepsis and aspiration pneunonia. Also with C. diff. Wide-complex tachycardia secondary to sepsis. Pt also underwent IVC filter placement for PE, and NG tube placement. Several recent hospitalizations.    PT Comments    Patient alert, agreeable to therapy today, eager to sit up at EOB. ModA supine to sit, and maxA to return to supine after sitting ~52minutes. Lateral scoot training attempted, maxA and step by step sequencing. Pt with poor buttock clearance. L knee blocked. Once returned to supine rolling modA performed with RN to change bed linens. PT and pt reviewed isometric exercises, verbalized understanding. The patient would benefit from further skilled PT intervention to continue to progress towards goals. Recommendation remains appropriate.     Follow Up Recommendations  SNF     Equipment Recommendations  None recommended by PT    Recommendations for Other Services OT consult     Precautions / Restrictions Precautions Precautions: Fall Precaution Comments: C.Diff Restrictions Weight Bearing Restrictions: No Other Position/Activity Restrictions: skin rash/sensitivity    Mobility  Bed Mobility Overal bed mobility: Needs Assistance Bed Mobility: Rolling;Supine to Sit;Sit to Supine Rolling: Mod assist   Supine to sit: Mod assist;HOB elevated Sit to supine: Max assist      Transfers     Transfers: Lateral/Scoot Transfers          Lateral/Scoot Transfers: Max assist General transfer comment: uanble; attempted to practice lateral scooting  Ambulation/Gait                 Stairs             Wheelchair Mobility    Modified  Rankin (Stroke Patients Only)       Balance Overall balance assessment: Needs assistance Sitting-balance support: Feet unsupported;Bilateral upper extremity supported Sitting balance-Leahy Scale: Fair Sitting balance - Comments: Able to sit for ~25 minutes, including lateral scooting attempts                                    Cognition Arousal/Alertness: Awake/alert Behavior During Therapy: WFL for tasks assessed/performed Overall Cognitive Status: Within Functional Limits for tasks assessed                                 General Comments: somewhat groggy at first, but increased attn and energy once assisted to seated position      Exercises Other Exercises Other Exercises: rolling to change bed sheets/chucks, modA with 2 person assist.    General Comments        Pertinent Vitals/Pain Pain Assessment: Faces Faces Pain Scale: Hurts a little bit Pain Location: back/skin, swollen feet. Pain Descriptors / Indicators: Grimacing;Tender Pain Intervention(s): Limited activity within patient's tolerance;Monitored during session;Repositioned    Home Living                      Prior Function            PT Goals (current goals can now be found in the care plan section) Progress towards PT goals: Progressing toward goals  Frequency    Min 2X/week      PT Plan Current plan remains appropriate    Co-evaluation              AM-PAC PT "6 Clicks" Mobility   Outcome Measure  Help needed turning from your back to your side while in a flat bed without using bedrails?: A Lot Help needed moving from lying on your back to sitting on the side of a flat bed without using bedrails?: A Lot Help needed moving to and from a bed to a chair (including a wheelchair)?: Total Help needed standing up from a chair using your arms (e.g., wheelchair or bedside chair)?: Total Help needed to walk in hospital room?: Total Help needed climbing 3-5  steps with a railing? : Total 6 Click Score: 8    End of Session Equipment Utilized During Treatment: Gait belt Activity Tolerance: Patient tolerated treatment well Patient left: with call bell/phone within reach;with bed alarm set;in bed Nurse Communication: Mobility status PT Visit Diagnosis: Other abnormalities of gait and mobility (R26.89);Muscle weakness (generalized) (M62.81);Difficulty in walking, not elsewhere classified (R26.2);Pain Pain - Right/Left: Right Pain - part of body: Knee     Time: 0912-0953 PT Time Calculation (min) (ACUTE ONLY): 41 min  Charges:  $Therapeutic Exercise: 8-22 mins $Therapeutic Activity: 23-37 mins                     Lieutenant Diego PT, DPT 10:44 AM,09/07/19

## 2019-09-07 NOTE — Progress Notes (Signed)
Spoke to Dr. Manuella Ghazi and dietician, will try to unclog dobb hoff per protocol.

## 2019-09-07 NOTE — Progress Notes (Signed)
[name] is stopped up and not working. I have tried to unclog but unsuccessful x 2 hours.  Stark Klein, NP notified.

## 2019-09-07 NOTE — Progress Notes (Signed)
NGT remains clamped as it stopped working overnight. D5.45NS continues to infuse.  Holding meds through tube, Dr. Manuella Ghazi aware and agrees with plan. Patient at this time is scheduled to have a PEG tube placed 9/3.

## 2019-09-07 NOTE — Progress Notes (Signed)
Per CT, patient not a canidate for g tube placement. Dr. Manuella Ghazi made aware.  Awaiting medication from pharmacy and will attempt to unclog dobb hoff when arrives.

## 2019-09-07 NOTE — Progress Notes (Signed)
PROGRESS NOTE    Albert [name]  FIE:[id number] DOB: 30-Jan-1933 DOA: 08/24/2019   PCP: [name], MD    Brief Narrative: Albert [name] is 86 y.o. male with PMH of CKD, HTN, recent GI bleed presents from SNF for fever and hypotension. He is admitted for sepsis secondary to possible aspiration pneumonia and C. difficile colitis.  He completed antibiotics for Aspiration pneumonia. He is on PO vancomycin through NG tube. Given dysphagia he underwent thorough workup,  Neurology thinks it secondary to autoimmune myopathies but there is concern about myositis or dermatomyositis,  Neurology recommended Muscle biopsy  And close rheumatology Out patient follow-up.  Patient underwent  upper esophagoscopy with  esophageal sphincter dilatation. Palliative care consulted to discuss goals of care,  Patient want full scope of care.  Patient is at very high risk for aspiration,  Patient has a NG tube placed by IR.  He is found to have small PE on CT chest,  Venous duplex negative for DVT. Patient has hx. Of GI bleeding ,  Vascular Surgery consulted, he underwent IVC filter placement.  Patient underwent thoracocentesis for pleural effusion , cytology for diagnosis.  Plan: Patient will need PEG tube for nutrition,  Patient will require muscle biopsy for confirmation of diagnosis.   Assessment & Plan:   Active Problems:   Elevated troponin   Benign essential hypertension   CKD (chronic kidney disease), stage III   Sepsis (HCC)   Lactic acidosis   Anemia   Elevated brain natriuretic peptide (BNP) level   Aspiration pneumonia of right lower lobe due to gastric secretions (HCC)   Dysphagia   C. difficile colitis   Ulcer of esophagus without bleeding   Hypomagnesemia   Hypokalemia   Advanced care planning/counseling discussion   Goals of care, counseling/discussion   Autoimmune disease ([place])   Palliative care by specialist   1. Sepsis, present on admission with hypotension.  This has improved.   Completed treatment for Aspiration pneumonia with Unasyn (day 5 of 5),          C. difficile colitis on vancomycin (day 9/10) unfortunately patient's Dobbhoff got clogged so he did not receive 2 doses of vancomycin - last night and this morning.  2.  C.difficile colitis.  Vancomycin via NG tube (day 9/10).  3. Dysphagia with failed swallow evaluation with aspiration on esophagram and modified barium swallow. 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  Recommended labs and close outpatient rheumatology follow-up.      ENT; Patient underwent rigid esophagoscopy with dilatation of the stricture.  Tolerated well.        Interventional radiology will not be able to do PEG tube placement based on the CT scan finding today on 9/2 showing completely trapped transverse colon      Currently has NG tube. Resumed  tube feeding or trial of swallowing after EUS dilation.  Patient's DHT is clogged on 9/2 and nursing was unable to unclog it.      Patient failed modified barium swallow, speech recommended n.p.o. alternate ways of feeding.        Palliative consulted to discuss goals of care. Patient wants full scope of care.      Plan: Dr. Para March is planning to do muscle biopsy and PEG tube placement on 9/3  Patient will need muscular biopsy for confirmation of diagnosis -surgery is planning for muscle biopsy tomorrow.  IV methylprednisolone for 5 days per neurology  4.  Bilateral pleural effusion: s/p thoracocentesis by IR on 8/31, cytology negative for malignancy  5.  Small PE: Patient is found to have small PE on CT chest, venous duplex negative, s/p IVC filter placement by vascular surgery  4. Hyperlipidemia.  Hold atorvastatin.  5. Hypomagnesemia >>>   Repleted and resolved  6. Hyperkalemia  >>> now resolved  status post Lokelma  7. Chronic kidney disease stage II: Renal functions back to base line.   8. Iron deficiency anemia with prior blood loss, esophageal ulcer on recent endoscopy.  Hemoglobin on the lower side but stable.  Hemoglobin 9.1 today  9. Smudge cells seen on CBC.  Hematology consulted, recommended  outpatient  Workup . Repeat labs: No smudge cells.  10.  Esophageal ulcer and esophagitis on Protonix.   DVT prophylaxis: Lovenox Code Status:  Full Family Communication: No one at bed side, D/W patient. Disposition Plan: Dispo: The patient is from: Rehab  Anticipated d/c is to: Rehab  Anticipated d/c date is: Yet to be determined secondary the patient will have to have some way of getting nutrition, waiting to get PEG tube tomorrow on 9/3  Patient is not medically clear given ongoing work-up.  Consultants:    ENT, Neurology, cardiology, surgery  Procedures:  PEG tube and muscle biopsy planned for tomorrow on 9/3  Antimicrobials:  Anti-infectives (From admission, onward)   Start     Dose/Rate Route Frequency Ordered Stop   09/08/19 0600  ceFAZolin (ANCEF) IVPB 2g/100 mL premix        2 g 200 mL/hr over 30 Minutes Intravenous On call to O.R. 09/07/19 1658 09/09/19 0559   09/05/19 0000  ceFAZolin (ANCEF) IVPB 2g/100 mL premix       Note to Pharmacy: To be given in specials   2 g 200 mL/hr over 30 Minutes Intravenous  Once 09/04/19 1146 09/04/19 2215   08/29/19 1800  vancomycin (VANCOCIN) 50 mg/mL oral solution 125 mg        125 mg Per Tube 4 times daily 08/29/19 1641 09/08/19 1759   08/28/19 1500  vancomycin (VANCOCIN) 500 mg in sodium chloride irrigation 0.9 % 100 mL ENEMA  Status:  Discontinued        500 mg Rectal Every 6 hours 08/28/19 1340 08/29/19 1641   08/28/19 1500  metroNIDAZOLE (FLAGYL) IVPB 500 mg  Status:  Discontinued        500 mg 100 mL/hr over 60 Minutes Intravenous Every 8 hours 08/28/19 1341 08/29/19 1642   08/26/19 1400   vancomycin (VANCOCIN) 50 mg/mL oral solution 125 mg  Status:  Discontinued        125 mg Oral 4 times daily 08/26/19 1255 08/28/19 1340   08/25/19 2000  vancomycin (VANCOCIN) IVPB 1000 mg/200 mL premix  Status:  Discontinued        1,000 mg 200 mL/hr over 60 Minutes Intravenous Every 24 hours 08/25/19 0240 08/25/19 1440   08/25/19 2000  doxycycline (VIBRA-TABS) tablet 100 mg  Status:  Discontinued        100 mg Oral Every 12 hours 08/25/19 1440 08/26/19 1254   08/25/19 1800  Ampicillin-Sulbactam (UNASYN) 3 g in sodium chloride 0.9 % 100 mL IVPB        3 g 200 mL/hr over 30 Minutes Intravenous Every 6 hours 08/25/19 1605 08/29/19 2336   08/25/19 1000  ceFEPIme (MAXIPIME) 2 g in sodium chloride 0.9 % 100 mL IVPB  Status:  Discontinued        2 g 200 mL/hr over 30 Minutes Intravenous Every 12 hours 08/25/19 0731 08/25/19 1548   08/25/19 0900  aztreonam (AZACTAM) 1 g in sodium chloride 0.9 % 100 mL IVPB  Status:  Discontinued        1 g 200 mL/hr over 30 Minutes Intravenous Every 8 hours 08/25/19 0228 08/25/19 0729   08/25/19 0015  aztreonam (AZACTAM) 2 g in sodium chloride 0.9 % 100 mL IVPB        2 g 200 mL/hr over 30 Minutes Intravenous  Once 08/25/19 0014 08/25/19 0115   08/25/19 0015  metroNIDAZOLE (FLAGYL) IVPB 500 mg  Status:  Discontinued        500 mg 100 mL/hr over 60 Minutes Intravenous Every 8 hours 08/25/19 0014 08/25/19 1548   08/25/19 0015  vancomycin (VANCOCIN) IVPB 1000 mg/200 mL premix        1,000 mg 200 mL/hr over 60 Minutes Intravenous  Once 08/25/19 0014 08/25/19 0222   08/24/19 2145  ceFEPIme (MAXIPIME) 2 g in sodium chloride 0.9 % 100 mL IVPB        2 g 200 mL/hr over 30 Minutes Intravenous  Once 08/24/19 2134 08/24/19 2222   08/24/19 2145  vancomycin (VANCOCIN) IVPB 1000 mg/200 mL premix        1,000 mg 200 mL/hr over 60 Minutes Intravenous  Once 08/24/19 2134 08/24/19 2251     Subjective: Patient was seen and examined at bedside, Overnight events noted.   Patient denies any difficulty breathing.   He is getting feeds and medications through the NG tube.He has IVC filter yesterday,  today he underwent thoracocentesis.   Objective: Vitals:   09/07/19 0512 09/07/19 0901 09/07/19 1050 09/07/19 1557  BP: 129/73 137/65 137/82 (!) 122/48  Pulse: 81 83 79 68  Resp: 20 18 18 19   Temp: 97.6 F (36.4 C) 97.7 F (36.5 C) 97.7 F (36.5 C) 97.6 F (36.4 C)  TempSrc: Oral Oral    SpO2: 98% 98% 100% 99%  Weight:      Height:        Intake/Output Summary (Last 24 hours) at 09/07/2019 2002 Last data filed at 09/07/2019 1847 Gross per 24 hour  Intake 1208.41 ml  Output 1150 ml  Net 58.41 ml   Filed Weights   09/03/19 0548 09/04/19 0349 09/05/19 0500  Weight: 78.7 kg 78.2 kg 74.8 kg    Examination:  General exam: Appears calm and comfortable , NG tube in Rt nostril Respiratory system: Clear to auscultation. Respiratory effort normal. Cardiovascular system: S1 & S2 heard, RRR. No JVD, murmurs, rubs, gallops or clicks. No pedal edema. Gastrointestinal system: Abdomen is nondistended, soft and nontender. No organomegaly or masses felt. Normal bowel sounds heard. Central nervous system: Alert and oriented. No focal neurological deficits. Extremities: No leg edema, no cyanosis. Skin: No rashes, lesions or ulcers Psychiatry: Judgement and insight appear normal. Mood & affect appropriate.     Data Reviewed: I have personally reviewed following labs and imaging studies  CBC: Recent Labs  Lab 09/03/19 0600 09/04/19 0429 09/05/19 0434 09/06/19 0452 09/07/19 0627  WBC 11.4* 9.7 9.4 10.1 3.5*  HGB 9.7* 9.1* 9.4* 8.7* 9.1*  HCT 30.6* 29.7* 29.2* 28.0* 27.7*  MCV 89.7 92.5 88.2 91.5 87.1  PLT 208 163 177 157 956*   Basic Metabolic Panel: Recent Labs  Lab 09/01/19 0526 09/01/19 0526  09/02/19 2094 09/02/19 7096 09/03/19 0600 09/03/19 0600 09/03/19 0841 09/04/19 0429 09/05/19 0434 09/05/19 0859 09/06/19 0452 09/07/19 0627  NA 151*    < > 145   < > 142  --   --  141 140  --  137 136  K 4.0   < > 4.4   < > 4.5   < >  --  5.1 5.3* 5.3* 5.4* 4.7  CL 122*   < > 120*   < > 116*  --   --  115* 113*  --  110 108  CO2 21*   < > 18*   < > 21*  --   --  22 23  --  21* 22  GLUCOSE 141*   < > 130*   < > 103*  --   --  108* 88  --  127* 159*  BUN 18   < > 21   < > 20  --   --  20 18  --  19 21  CREATININE 1.06   < > 1.03   < > 0.80  --   --  0.72 0.67  --  0.70 0.74  CALCIUM 7.7*   < > 7.4*   < > 7.3*  --   --  7.3* 7.2*  --  7.5* 7.5*  MG 1.7   < > 1.5*  --   --   --  2.1 1.7 1.6*  --  1.9  --   PHOS 3.4  --  3.1  --   --   --   --  3.0 3.2  --  3.5  --    < > = values in this interval not displayed.   GFR: Estimated Creatinine Clearance: 63.6 mL/min (by C-G formula based on SCr of 0.74 mg/dL). Liver Function Tests: No results for input(s): AST, ALT, ALKPHOS, BILITOT, PROT, ALBUMIN in the last 168 hours. No results for input(s): LIPASE, AMYLASE in the last 168 hours. No results for input(s): AMMONIA in the last 168 hours. Coagulation Profile: No results for input(s): INR, PROTIME in the last 168 hours. Cardiac Enzymes: No results for input(s): CKTOTAL, CKMB, CKMBINDEX, TROPONINI in the last 168 hours. BNP (last 3 results) No results for input(s): PROBNP in the last 8760 hours. HbA1C: No results for input(s): HGBA1C in the last 72 hours. CBG: No results for input(s): GLUCAP in the last 168 hours. Lipid Profile: No results for input(s): CHOL, HDL, LDLCALC, TRIG, CHOLHDL, LDLDIRECT in the last 72 hours. Thyroid Function Tests: No results for input(s): TSH, T4TOTAL, FREET4, T3FREE, THYROIDAB in the last 72 hours. Anemia Panel: No results for input(s): VITAMINB12, FOLATE, FERRITIN, TIBC, IRON, RETICCTPCT in the last 72 hours. Sepsis Labs: No results for input(s): PROCALCITON, LATICACIDVEN in the last 168 hours.  Recent Results (from the past 240 hour(s))  Body fluid culture     Status: None (Preliminary result)   Collection  Time: 09/05/19  3:15 PM   Specimen: PATH Cytology Pleural fluid  Result Value Ref Range Status   Specimen Description   Final    PLEURAL Performed at [facility], [address] [place], [place] [address]    Special Requests   Final    NONE Performed at [facility], [address] [place], [place] [address]    Gram Stain   Final    FEW WBC PRESENT, PREDOMINANTLY MONONUCLEAR NO ORGANISMS SEEN    Culture   Final    NO GROWTH 2 DAYS  Performed at [facility], [place] [address] [place], [place] [address]    Report Status PENDING  Incomplete  Acid Fast Smear (AFB)     Status: None   Collection Time: 09/05/19  3:15 PM   Specimen: PATH Cytology Pleural fluid  Result Value Ref Range Status   AFB Specimen Processing Concentration  Final   Acid Fast Smear Negative  Final    Comment: (NOTE) Performed At: [facility] [place], [place] [id number] Rush Farmer MD YT:[id number]    Source (AFB) PLEURAL  Final    Comment: Performed at [facility], [address] [place], [place] [address]     Radiology Studies: CT ABDOMEN WO CONTRAST  Result Date: 09/07/2019 CLINICAL DATA:  Dysphagia and assessment for possible percutaneous gastrostomy tube placement. EXAM: CT ABDOMEN WITHOUT CONTRAST TECHNIQUE: Multidetector CT imaging of the abdomen was performed following the standard protocol without IV contrast. COMPARISON:  CT of the chest on 09/02/2019 FINDINGS: Lower chest: Significantly diminished right pleural effusion after recent thoracentesis. Small to moderate left pleural effusion. Hepatobiliary: No focal liver abnormality is seen. No gallstones, gallbladder wall thickening, or biliary dilatation. Pancreas: Unremarkable. No pancreatic ductal dilatation or surrounding inflammatory changes. Spleen: Normal in size without focal abnormality. Adrenals/Urinary Tract: Adrenal glands are unremarkable. Kidneys are normal, without renal  calculi, focal lesion, or hydronephrosis. Stomach/Bowel: Feeding tube present traversing the stomach and terminating in the transverse duodenum. No hiatal hernia. The stomach has a relative horizontal position and is almost completely located posterior to the transverse colon and splenic flexure. No evidence of bowel obstruction, visible bowel inflammation or free intraperitoneal air. Vascular/Lymphatic: Calcified plaque throughout the abdominal aorta without evidence of aneurysm. No enlarged lymph nodes identified in the abdomen. Other: No ascites or focal fluid collections. No hernias identified. Body wall edema present. Musculoskeletal: No acute or significant osseous findings. IMPRESSION: 1. Significantly diminished right pleural effusion after recent thoracentesis. 2. Small to moderate left pleural effusion. 3. The stomach has a relative horizontal position and is almost completely located posterior to the transverse colon and splenic flexure. The patient is not a candidate for percutaneous gastrostomy due to high risk of colonic injury. Electronically Signed   By: Aletta Edouard M.D.   On: 09/07/2019 16:32    Scheduled Meds: . feeding supplement (PROSource TF)  45 mL Per Tube Daily  . free water  100 mL Per Tube Q4H  . metoprolol tartrate  2.5 mg Intravenous Q12H  . pantoprazole (PROTONIX) IV  40 mg Intravenous Q24H  . sodium zirconium cyclosilicate  10 g Per Tube Daily  . vancomycin  125 mg Per Tube QID   Continuous Infusions: . sodium chloride 250 mL (08/29/19 0621)  . [START ON 09/08/2019]  ceFAZolin (ANCEF) IV    . dextrose 5 % and 0.45% NaCl 50 mL/hr at 09/07/19 1700  . feeding supplement (OSMOLITE 1.5 CAL) 1,000 mL (09/06/19 1417)  . methylPREDNISolone (SOLU-MEDROL) injection Stopped (09/07/19 1543)     LOS: 13 days    Time spent: 35 mins.   Max Sane, MD [facility]   If 7PM-7AM, please contact night-coverage

## 2019-09-07 NOTE — Progress Notes (Signed)
Attempted to flush NGT with enzymes per protocol order, unable to flush solution/water through tube. Dr. Manuella Ghazi notified.

## 2019-09-07 NOTE — Consult Note (Signed)
Date of Consultation:  09/07/2019  Requesting Physician:  Max Sane, MD  Reason for Consultation:  Muscle biopsy and G-tube  History of Present Illness: Albert [name] is a 86 y.o. male admitted on 08/24/19 with history of CKD, HTN, recent GI bleed, presented with sepsis secondary to aspiration pneumonia and C-diff colitis.  He has been having progressing muscular weakness as well as dysphagia.  Neurology has evaluated the patietn and there is concern for autoimmune myopathy, myositis, or dermatomyositis.  Muscle biopsy has been recommended and surgery consulted for this.  Also, given his dysphagia, he has been on tube feeds via [name] tube.  Surgery has also been consulted for G tube placement.  IR had been consulted and CT scan of abdomen was obtained to evaluate his anatomy, but there is colon overlying the stomach, thus no good target for percutaneous placement.  Past Medical History: Past Medical History:  Diagnosis Date  . Actinic keratosis 04/23/2008   L elbow distal tricep (bx proven)  . Basal cell carcinoma 06/17/2006   L preauricular   . Basal cell carcinoma 05/26/2012   L ant neck   . Basal cell carcinoma 11/02/2012   Glabella - excision   . Basal cell carcinoma 08/15/2014   L med ankle   . Basal cell carcinoma 02/23/2017   R lat infrapectoral - excision  . Basal cell carcinoma 12/08/2017   L mid to distal lat volar forearm   . Hypertension   . Squamous cell carcinoma of skin 02/14/2014   R lat low back - ED&C  . Squamous cell carcinoma of skin 10/02/2014   R prox lat bicep - ED&C     Past Surgical History: Past Surgical History:  Procedure Laterality Date  . COLONOSCOPY N/A 08/05/2019   Procedure: COLONOSCOPY;  Surgeon: Toledo, Benay Pike, MD;  Location: [facility];  Service: Gastroenterology;  Laterality: N/A;  . ESOPHAGEAL DILATION N/A 08/30/2019   Procedure: ESOPHAGEAL DILATION;  Surgeon: Margaretha Sheffield, MD;  Location: [facility] ORS;  Service: ENT;  Laterality: N/A;   . ESOPHAGOGASTRODUODENOSCOPY N/A 08/05/2019   Procedure: ESOPHAGOGASTRODUODENOSCOPY (EGD);  Surgeon: Toledo, Benay Pike, MD;  Location: [facility];  Service: Gastroenterology;  Laterality: N/A;  . ESOPHAGOSCOPY N/A 08/30/2019   Procedure: ESOPHAGOSCOPY;  Surgeon: Margaretha Sheffield, MD;  Location: [facility] ORS;  Service: ENT;  Laterality: N/A;  . IVC FILTER INSERTION N/A 09/04/2019   Procedure: IVC FILTER INSERTION;  Surgeon: Algernon Huxley, MD;  Location: [facility];  Service: Cardiovascular;  Laterality: N/A;    Home Medications: Prior to Admission medications   Medication Sig Start Date End Date Taking? Authorizing Provider  aspirin EC 81 MG tablet Take 1 tablet (81 mg total) by mouth daily. Swallow whole.start 08/19/19 08/08/19 08/07/20 Yes Georgette Shell, MD  atorvastatin (LIPITOR) 20 MG tablet Take 20 mg by mouth daily. 11/20/18  Yes [provider]  hydrochlorothiazide (HYDRODIURIL) 25 MG tablet Take 25 mg by mouth daily.   Yes [provider]  Multiple Vitamin (MULTIVITAMIN WITH MINERALS) TABS tablet Take 1 tablet by mouth daily. 08/09/19  Yes Georgette Shell, MD  ondansetron (ZOFRAN) 4 MG tablet Take 1 tablet (4 mg total) by mouth every 6 (six) hours as needed for nausea. 08/08/19  Yes Georgette Shell, MD  pantoprazole (PROTONIX) 40 MG tablet Take 1 tablet (40 mg total) by mouth daily. 08/09/19  Yes Georgette Shell, MD  celecoxib (CELEBREX) 200 MG capsule Take 200 mg by mouth daily.  Patient not taking: Reported  on 08/25/2019    [provider]    Allergies: No Active Allergies  Social History:  reports that he has never smoked. He has never used smokeless tobacco. He reports previous alcohol use. He reports that he does not use drugs.   Family History: History reviewed. No pertinent family history.  Review of Systems: Review of Systems  Constitutional: Negative for chills and fever.  HENT: Negative for hearing loss.   Respiratory:  Negative for shortness of breath.   Cardiovascular: Negative for chest pain.  Gastrointestinal: Positive for abdominal pain and diarrhea. Negative for constipation, nausea and vomiting.  Genitourinary: Negative for dysuria.  Musculoskeletal: Positive for myalgias.  Skin: Negative for rash.  Neurological: Positive for weakness. Negative for dizziness.  Psychiatric/Behavioral: Negative for depression.    Physical Exam BP (!) 113/57 (BP Location: Left Arm)   Pulse 71   Temp 97.6 F (36.4 C) (Oral)   Resp 20   Ht 5\' 4"  (1.626 m)   Wt 74.8 kg   SpO2 98%   BMI 28.31 kg/m  CONSTITUTIONAL: No acute distress HEENT:  Normocephalic, atraumatic, extraocular motion intact. NECK: Trachea is midline, and there is no jugular venous distension. RESPIRATORY:  Lungs are clear, and breath sounds are equal bilaterally. Normal respiratory effort without pathologic use of accessory muscles. CARDIOVASCULAR: Heart is regular without murmurs, gallops, or rubs. GI: The abdomen is soft, non-distended, non-tender.  MUSCULOSKELETAL:  Patient has bilateral upper extremity edema, less so in bilateral lower extremities. SKIN: Skin turgor is normal. There are no pathologic skin lesions.  NEUROLOGIC:  Motor and sensation is grossly normal.  Cranial nerves are grossly intact. PSYCH:  Alert and oriented to person, place and time. Affect is normal.  Laboratory Analysis: Results for orders placed or performed during the hospital encounter of 08/24/19 (from the past 24 hour(s))  CBC     Status: Abnormal   Collection Time: 09/07/19  6:27 AM  Result Value Ref Range   WBC 3.5 (L) 4.0 - 10.5 K/uL   RBC 3.18 (L) 4.22 - 5.81 MIL/uL   Hemoglobin 9.1 (L) 13.0 - 17.0 g/dL   HCT 27.7 (L) 39 - 52 %   MCV 87.1 80.0 - 100.0 fL   MCH 28.6 26.0 - 34.0 pg   MCHC 32.9 30.0 - 36.0 g/dL   RDW 18.5 (H) 11.5 - 15.5 %   Platelets 114 (L) 150 - 400 K/uL   nRBC 0.0 0.0 - 0.2 %  Basic metabolic panel     Status: Abnormal    Collection Time: 09/07/19  6:27 AM  Result Value Ref Range   Sodium 136 135 - 145 mmol/L   Potassium 4.7 3.5 - 5.1 mmol/L   Chloride 108 98 - 111 mmol/L   CO2 22 22 - 32 mmol/L   Glucose, Bld 159 (H) 70 - 99 mg/dL   BUN 21 8 - 23 mg/dL   Creatinine, Ser 0.74 0.61 - 1.24 mg/dL   Calcium 7.5 (L) 8.9 - 10.3 mg/dL   GFR calc non Af Amer >60 >60 mL/min   GFR calc Af Amer >60 >60 mL/min   Anion gap 6 5 - 15    Imaging: CT ABDOMEN WO CONTRAST  Result Date: 09/07/2019 CLINICAL DATA:  Dysphagia and assessment for possible percutaneous gastrostomy tube placement. EXAM: CT ABDOMEN WITHOUT CONTRAST TECHNIQUE: Multidetector CT imaging of the abdomen was performed following the standard protocol without IV contrast. COMPARISON:  CT of the chest on 09/02/2019 FINDINGS: Lower chest: Significantly diminished right pleural  effusion after recent thoracentesis. Small to moderate left pleural effusion. Hepatobiliary: No focal liver abnormality is seen. No gallstones, gallbladder wall thickening, or biliary dilatation. Pancreas: Unremarkable. No pancreatic ductal dilatation or surrounding inflammatory changes. Spleen: Normal in size without focal abnormality. Adrenals/Urinary Tract: Adrenal glands are unremarkable. Kidneys are normal, without renal calculi, focal lesion, or hydronephrosis. Stomach/Bowel: Feeding tube present traversing the stomach and terminating in the transverse duodenum. No hiatal hernia. The stomach has a relative horizontal position and is almost completely located posterior to the transverse colon and splenic flexure. No evidence of bowel obstruction, visible bowel inflammation or free intraperitoneal air. Vascular/Lymphatic: Calcified plaque throughout the abdominal aorta without evidence of aneurysm. No enlarged lymph nodes identified in the abdomen. Other: No ascites or focal fluid collections. No hernias identified. Body wall edema present. Musculoskeletal: No acute or significant osseous  findings. IMPRESSION: 1. Significantly diminished right pleural effusion after recent thoracentesis. 2. Small to moderate left pleural effusion. 3. The stomach has a relative horizontal position and is almost completely located posterior to the transverse colon and splenic flexure. The patient is not a candidate for percutaneous gastrostomy due to high risk of colonic injury. Electronically Signed   By: Aletta Edouard M.D.   On: 09/07/2019 16:32    Assessment and Plan: This is a 86 y.o. male with dysphagia requiring G tube placement and muscle weakness requiring muscle biopsy.  --Discussed with the patient that we would perform a muscle biopsy tomorrow.  Given the upper extremity edema, will do the right thigh for the biopsy site.  Also given that IR is unable to do percutaneous feeding tube placement, will do this in OR under general anesthesia.  Discussed with the patient both procedures at length, including risks of bleeding, infection, and injury to surrounding structures.  He's willing to proceed.  Will schedule him for tomorrow around noon time. --Keep NPO after midnight, hold anticoagulation.  IV abx on call to OR.  Face-to-face time spent with the patient and care providers was 80 minutes, with more than 50% of the time spent counseling, educating, and coordinating care of the patient.     Melvyn Neth, MD [facility] Pg:  [phone]

## 2019-09-07 NOTE — Progress Notes (Signed)
Patient ID: Albert [name], male   DOB: 27-Mar-1933, 86 y.o.   MRN: [id number] Patient's CT of the abdomen performed today was reviewed by Dr. Kathlene Cote.  The transverse colon is completely draped over much of the stomach and therefore patient is not a candidate for IR percutaneous gastrostomy tube placement. Consider GI and/or surgical consultation.

## 2019-09-07 NOTE — Progress Notes (Signed)
Nutrition Follow-up  DOCUMENTATION CODES:   Severe malnutrition in context of chronic illness  INTERVENTION:  Monitor for G-tube placement, If G-tube placement is not recommended secondary to anatomy, initiate TF unclogging protocol  When appropriate, resume  Osmolite 1.5 @ 50 ml/hr with 45 ml ProSource TF daily via tube as able  Free water flushes 100 ml q4 hours  Regimen provides 1840 kcal/day, 86 grams of protein, and 1514 ml free water    NUTRITION DIAGNOSIS:   Severe Malnutrition related to chronic illness (dysphagia, recent COVID 19, CKD) as evidenced by severe fat depletion, severe muscle depletion. -ongoing  GOAL:   Patient will meet greater than or equal to 90% of their needs -meeting with TF  MONITOR:   Labs, Weight trends, TF tolerance, Skin, I & O's  REASON FOR ASSESSMENT:   NPO/Clear Liquid Diet    ASSESSMENT:  RD working remotely.  86 y.o. male  with past medical history of CKD, HTN, HLD, s/p COVID admission winter 2020, recent admission for GI bleeding dx with esophageal and duodenal ulcers, admitted on 08/24/2019 with sepsis r/t aspiration pneumonia and C. Difficile infection.  Pt s/p rigid upper esophagoscopy,upper esophageal sphincter dilation 8/25  8/26-  fluoroscopy guided nasogastric tube placed; tube feeds initiated -Osmolite 1.5 @ 50 ml/hr with 45 ml ProSource TF via tube  Noted tube stopped working overnight, unsuccessful at [employer] attempts. RD ordered unclogging protocol and called RN to notify. RN reports plans for IR PEG tube placement on 9/3 pending updated CT abdomen to assess anatomy planned for today, holding on initiating unclogging protocol per MD. NGT remains clamped, D5 @ 50 ml/hr infusing providing 204 kcal/day.  Weights have trended up 11 lbs this admit.  Medications reviewed and include: Protonix, Vancomycin, Methylprednisolone  Labs: K 4.7 (WNL) trended down from 5.4, WBC 3.5 (L), Hgb 9.1 (L) trending up, HCT 27.7 (L) 9/1  Mg/P - WNL  Diet Order:   Diet Order            Diet NPO time specified  Diet effective midnight           Diet NPO time specified  Diet effective now                 EDUCATION NEEDS:   No education needs have been identified at this time  Skin:  Skin Assessment: Reviewed RN Assessment (ecchymosis, skin tear buttocks)  Last BM:  8/25- type 7  Height:   Ht Readings from Last 1 Encounters:  08/24/19 5\' 4"  (1.626 m)    Weight:   Wt Readings from Last 1 Encounters:  09/05/19 74.8 kg    Ideal Body Weight:  59 kg  BMI:  Body mass index is 28.31 kg/m.  Estimated Nutritional Needs:   Kcal:  1700-1900kcal/day  Protein:  85-95g/day  Fluid:  1.5-1.8L/ay   Lajuan Lines, RD, LDN Clinical Nutrition After Hours/Weekend Pager # in [place]

## 2019-09-07 NOTE — Progress Notes (Signed)
Reason for consult:   Subjective:   ROS: negative except above  Examination  Vital signs in last 24 hours: Temp:  [97.6 F (36.4 C)-98.6 F (37 C)] 97.7 F (36.5 C) (09/02 1050) Pulse Rate:  [79-88] 79 (09/02 1050) Resp:  [17-20] 18 (09/02 1050) BP: (122-137)/(63-82) 137/82 (09/02 1050) SpO2:  [98 %-100 %] 100 % (09/02 1050)  General: lying in bed CVS: pulse-normal rate and rhythm RS: breathing comfortably Extremities: normal   Neuro MS: Alert, oriented, follows commands CN: pupils equal and reactive,  EOMI, face symmetric, tongue midline, normal sensation over face, Motor: 3 x 5 strength proximal muscles bilateral upper extremities, biceps 4 x 5 bilaterally, triceps 4/5 bilaterally.  Hand grip 5/5.  Lower extremities: 2 x 5 proximal muscles, 4 x 5 plantarflexion and extension. Reflexes: plantars: flexor Coordination: normal Gait: not tested  Basic Metabolic Panel: Recent Labs  Lab 09/01/19 0526 09/01/19 0526 09/02/19 0642 09/02/19 0642 09/03/19 0600 09/03/19 0600 09/03/19 0841 09/04/19 0429 09/04/19 0429 09/05/19 0434 09/05/19 0859 09/06/19 0452 09/07/19 0627  NA 151*   < > 145   < > 142  --   --  141  --  140  --  137 136  K 4.0   < > 4.4   < > 4.5   < >  --  5.1  --  5.3* 5.3* 5.4* 4.7  CL 122*   < > 120*   < > 116*  --   --  115*  --  113*  --  110 108  CO2 21*   < > 18*   < > 21*  --   --  22  --  23  --  21* 22  GLUCOSE 141*   < > 130*   < > 103*  --   --  108*  --  88  --  127* 159*  BUN 18   < > 21   < > 20  --   --  20  --  18  --  19 21  CREATININE 1.06   < > 1.03   < > 0.80  --   --  0.72  --  0.67  --  0.70 0.74  CALCIUM 7.7*   < > 7.4*   < > 7.3*   < >  --  7.3*   < > 7.2*  --  7.5* 7.5*  MG 1.7   < > 1.5*  --   --   --  2.1 1.7  --  1.6*  --  1.9  --   PHOS 3.4  --  3.1  --   --   --   --  3.0  --  3.2  --  3.5  --    < > = values in this interval not displayed.    CBC: Recent Labs  Lab 09/03/19 0600 09/04/19 0429 09/05/19 0434  09/06/19 0452 09/07/19 0627  WBC 11.4* 9.7 9.4 10.1 3.5*  HGB 9.7* 9.1* 9.4* 8.7* 9.1*  HCT 30.6* 29.7* 29.2* 28.0* 27.7*  MCV 89.7 92.5 88.2 91.5 87.1  PLT 208 163 177 157 114*     Coagulation Studies: No results for input(s): LABPROT, INR in the last 72 hours.  Imaging Reviewed:     ASSESSMENT AND PLAN  This is an 86 year old gentleman who is previously fairly healthy and has had a complicated course recently with multiple hospitalizations, GI bleed, C. Diff infection and aspiration pneumonia.    His general examination  is notable for diffuse atrophy, lower extremity edema, and a resolving rash on his trunk and arms with some possible nailbed changes on his toes.   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  Additionally he the aforementioned muscular atrophy as well as a pattern of proximal greater than distal weakness suggestive of a myopathy or myositis.  He does not have features suggestive of myasthenia gravis such as fatigability, diplopia.  He does have some features suggestive of an autoimmune process such as positive ANA and SSB and the notable rash and skin changes.  There is always a possibility of an underlying paraneoplastic process; successful treatment of possible paraneoplastic myositis requires an malignancy workup: currently being addressed.    Ideally patient would have EMG/NCS (not available inpatient here and ongoing pandemic with current rising cases excludes transfer to academic center in a timely fashion). We will attempt to obtain muscle biopsy (left biceps) next week once glutaraldehyde ordered has arrived from [facility] (specimen to be processed at [facility]). Unfortunately negative muscle enzymes do not rule out an inflammatory muscle process, especially given his advanced weakness and muscle wasting     Recommendations:   # Generalized weakness, including dysphagia, stable   # Continue Methylprednisone 500mg  IV BID >Pending labs/tests below :  -anti-HMGCR Ab ([facility] send out 2-3 weeks return time),  -myositis panel ([facility] send out 2-3 weeks return time) -muscle biopsy to be pursued here next week; glutaraldehyde ordered from [facility] -plan for possible IVIG and steroids after all necessary workup for hematological malignancy complete  >Additional support on an outpatient basis - Rheumatology evaluation - EMG/NCS      Other medical problems:   #Possible malignancy: Smudge cells present on blood smear, pleural fluid sent for cytology  # PE, newly diagnosed 8/28  # Infections, resolving  # Pituitary incidentaloma    Rhylei Mcquaig [facility] Pager Number [phone] For questions after 7pm please refer to AMION to reach the Neurologist on call

## 2019-09-07 NOTE — Progress Notes (Signed)
Patient ID: Albert [name], male   DOB: 12-24-1933, 86 y.o.   MRN: [id number] Request received for possible gastrostomy tube placement in patient.  Original consultation done by our service on 8/24.  Patient has since received treatment for C. difficile.  Prior imaging studies have been reviewed by Dr. Kathlene Cote.  He recommends updated CT abdomen without contrast to assess anatomy prior to proceeding with G-tube placement.  [facility] notified.  We will continue to monitor.

## 2019-09-08 ENCOUNTER — Encounter
Admission: EM | Disposition: A | Discharge status: Skilled Nursing Facility | Payer: Self-pay | Source: Home / Self Care | Attending: Family Medicine

## 2019-09-08 ENCOUNTER — Encounter: Payer: Self-pay | Admitting: Internal Medicine

## 2019-09-08 ENCOUNTER — Inpatient Hospital Stay: Payer: Medicare Other | Admitting: Anesthesiology

## 2019-09-08 ENCOUNTER — Other Ambulatory Visit: Payer: Medicare Other

## 2019-09-08 HISTORY — PX: MUSCLE BIOPSY: SHX716

## 2019-09-08 HISTORY — PX: GASTROSTOMY: SHX5249

## 2019-09-08 LAB — CBC WITH DIFFERENTIAL/PLATELET
Abs Immature Granulocytes: 0.02 10*3/uL (ref 0.00–0.07)
Basophils Absolute: 0 10*3/uL (ref 0.0–0.1)
Basophils Relative: 0 %
Eosinophils Absolute: 0 10*3/uL (ref 0.0–0.5)
Eosinophils Relative: 0 %
HCT: 28.1 % — ABNORMAL LOW (ref 39.0–52.0)
Hemoglobin: 9.2 g/dL — ABNORMAL LOW (ref 13.0–17.0)
Immature Granulocytes: 1 %
Lymphocytes Relative: 18 %
Lymphs Abs: 0.5 10*3/uL — ABNORMAL LOW (ref 0.7–4.0)
MCH: 28.3 pg (ref 26.0–34.0)
MCHC: 32.7 g/dL (ref 30.0–36.0)
MCV: 86.5 fL (ref 80.0–100.0)
Monocytes Absolute: 0.1 10*3/uL (ref 0.1–1.0)
Monocytes Relative: 5 %
Neutro Abs: 2.1 10*3/uL (ref 1.7–7.7)
Neutrophils Relative %: 76 %
Platelets: 150 10*3/uL (ref 150–400)
RBC: 3.25 MIL/uL — ABNORMAL LOW (ref 4.22–5.81)
RDW: 18.6 % — ABNORMAL HIGH (ref 11.5–15.5)
WBC: 2.8 10*3/uL — ABNORMAL LOW (ref 4.0–10.5)
nRBC: 0 % (ref 0.0–0.2)

## 2019-09-08 LAB — BASIC METABOLIC PANEL WITH GFR
Anion gap: 6 (ref 5–15)
BUN: 24 mg/dL — ABNORMAL HIGH (ref 8–23)
CO2: 21 mmol/L — ABNORMAL LOW (ref 22–32)
Calcium: 7.6 mg/dL — ABNORMAL LOW (ref 8.9–10.3)
Chloride: 108 mmol/L (ref 98–111)
Creatinine, Ser: 0.78 mg/dL (ref 0.61–1.24)
GFR calc Af Amer: >60 mL/min
GFR calc non Af Amer: >60 mL/min
Glucose, Bld: 135 mg/dL — ABNORMAL HIGH (ref 70–99)
Potassium: 4.6 mmol/L (ref 3.5–5.1)
Sodium: 135 mmol/L (ref 135–145)

## 2019-09-08 LAB — MAGNESIUM: Magnesium: 1.9 mg/dL (ref 1.7–2.4)

## 2019-09-08 LAB — PROTIME-INR
INR: 1 (ref 0.8–1.2)
Prothrombin Time: 13.1 seconds (ref 11.4–15.2)

## 2019-09-08 SURGERY — MUSCLE BIOPSY
Anesthesia: General | Laterality: Right

## 2019-09-08 MED ORDER — FENTANYL CITRATE (PF) 100 MCG/2ML IJ SOLN
INTRAMUSCULAR | Status: AC
  Filled 2019-09-08: qty 2

## 2019-09-08 MED ORDER — BUPIVACAINE LIPOSOME 1.3 % IJ SUSP
INTRAMUSCULAR | Status: AC
  Filled 2019-09-08: qty 20

## 2019-09-08 MED ORDER — ONDANSETRON HCL 4 MG/2ML IJ SOLN
INTRAMUSCULAR | Status: AC
  Filled 2019-09-08: qty 2

## 2019-09-08 MED ORDER — PROPOFOL 10 MG/ML IV BOLUS
INTRAVENOUS | Status: DC | PRN
  Administered 2019-09-08: 100 mg via INTRAVENOUS

## 2019-09-08 MED ORDER — LACTATED RINGERS IV SOLN: INTRAVENOUS | Status: DC | PRN

## 2019-09-08 MED ORDER — PROPOFOL 10 MG/ML IV BOLUS
INTRAVENOUS | Status: AC
  Filled 2019-09-08: qty 20

## 2019-09-08 MED ORDER — ESMOLOL HCL 100 MG/10ML IV SOLN
INTRAVENOUS | Status: DC | PRN
  Administered 2019-09-08: 20 mg via INTRAVENOUS

## 2019-09-08 MED ORDER — CEFAZOLIN SODIUM-DEXTROSE 2-4 GM/100ML-% IV SOLN
INTRAVENOUS | Status: AC
  Filled 2019-09-08: qty 100

## 2019-09-08 MED ORDER — ONDANSETRON HCL 4 MG/2ML IJ SOLN
INTRAMUSCULAR | Status: DC | PRN
  Administered 2019-09-08: 4 mg via INTRAVENOUS

## 2019-09-08 MED ORDER — ROCURONIUM 10MG/ML (10ML) SYRINGE FOR MEDFUSION PUMP - OPTIME
INTRAVENOUS | Status: DC | PRN
  Administered 2019-09-08: 50 mg via INTRAVENOUS
  Administered 2019-09-08: 20 mg via INTRAVENOUS

## 2019-09-08 MED ORDER — SODIUM CHLORIDE 0.9 % IV SOLN
INTRAVENOUS | Status: DC | PRN
  Administered 2019-09-08: 200 ug via INTRAVENOUS
  Administered 2019-09-08: 100 ug via INTRAVENOUS
  Administered 2019-09-08 (×2): 200 ug via INTRAVENOUS

## 2019-09-08 MED ORDER — BUPIVACAINE-EPINEPHRINE (PF) 0.25% -1:200000 IJ SOLN
INTRAMUSCULAR | Status: AC
  Filled 2019-09-08: qty 30

## 2019-09-08 MED ORDER — ESMOLOL HCL 100 MG/10ML IV SOLN
INTRAVENOUS | Status: AC
  Filled 2019-09-08: qty 10

## 2019-09-08 MED ORDER — SUGAMMADEX SODIUM 200 MG/2ML IV SOLN
INTRAVENOUS | Status: DC | PRN
  Administered 2019-09-08: 150 mg via INTRAVENOUS

## 2019-09-08 MED ORDER — FENTANYL CITRATE (PF) 100 MCG/2ML IJ SOLN
INTRAMUSCULAR | Status: DC | PRN
Start: 2019-09-08 — End: 2019-09-08
  Administered 2019-09-08 (×3): 50 ug via INTRAVENOUS

## 2019-09-08 MED ORDER — LIDOCAINE HCL (CARDIAC) PF 100 MG/5ML IV SOSY
PREFILLED_SYRINGE | INTRAVENOUS | Status: DC | PRN
  Administered 2019-09-08: 80 mg via INTRATRACHEAL

## 2019-09-08 MED ORDER — BUPIVACAINE LIPOSOME 1.3 % IJ SUSP
INTRAMUSCULAR | Status: DC | PRN
  Administered 2019-09-08: 50 mL

## 2019-09-08 MED ORDER — MORPHINE SULFATE (PF) 2 MG/ML IV SOLN
2.0000 mg | INTRAVENOUS | Status: DC | PRN
  Administered 2019-09-08 – 2019-09-09 (×2): 2 mg via INTRAVENOUS
  Filled 2019-09-08 (×2): qty 1

## 2019-09-08 SURGICAL SUPPLY — 47 items
BLADE SURG 15 STRL LF DISP TIS (BLADE) ×2 IMPLANT
BLADE SURG 15 STRL SS (BLADE) ×2
CANISTER SUCT 1200ML W/VALVE (MISCELLANEOUS) ×4 IMPLANT
CHLORAPREP W/TINT 26 (MISCELLANEOUS) ×4 IMPLANT
CLAMP MUSCLE BIOPSY 12MM DISP (MISCELLANEOUS) ×4 IMPLANT
CNTNR SPEC 2.5X3XGRAD LEK (MISCELLANEOUS) ×4
CONT SPEC 4OZ STER OR WHT (MISCELLANEOUS) ×4
CONTAINER SPEC 2.5X3XGRAD LEK (MISCELLANEOUS) ×4 IMPLANT
COVER WAND RF STERILE (DRAPES) ×4 IMPLANT
DERMABOND ADVANCED (GAUZE/BANDAGES/DRESSINGS) ×4
DERMABOND ADVANCED .7 DNX12 (GAUZE/BANDAGES/DRESSINGS) ×4 IMPLANT
DRAIN CONNECTOR BLAKE 1:1 (MISCELLANEOUS) ×4 IMPLANT
DRAIN PENROSE 1/4X12 LTX STRL (WOUND CARE) ×4 IMPLANT
DRAPE LAPAROTOMY 100X77 ABD (DRAPES) ×4 IMPLANT
ELECT CAUTERY BLADE 6.4 (BLADE) ×4 IMPLANT
ELECT REM PT RETURN 9FT ADLT (ELECTROSURGICAL) ×4
ELECTRODE REM PT RTRN 9FT ADLT (ELECTROSURGICAL) ×2 IMPLANT
GAUZE SPONGE 4X4 12PLY STRL (GAUZE/BANDAGES/DRESSINGS) ×8 IMPLANT
GLOVE SURG SYN 7.0 (GLOVE) ×8 IMPLANT
GLOVE SURG SYN 7.5  E (GLOVE) ×2
GLOVE SURG SYN 7.5 E (GLOVE) ×2 IMPLANT
GOWN STRL REUS W/ TWL LRG LVL3 (GOWN DISPOSABLE) ×2 IMPLANT
GOWN STRL REUS W/TWL LRG LVL3 (GOWN DISPOSABLE) ×2
KIT TURNOVER KIT A (KITS) ×4 IMPLANT
LABEL OR SOLS (LABEL) ×4 IMPLANT
NEEDLE HYPO 22GX1.5 SAFETY (NEEDLE) ×4 IMPLANT
NS IRRIG 1000ML POUR BTL (IV SOLUTION) ×4 IMPLANT
PACK BASIN MINOR (MISCELLANEOUS) ×4 IMPLANT
SPONGE DRAIN TRACH 4X4 STRL 2S (GAUZE/BANDAGES/DRESSINGS) ×4 IMPLANT
SPONGE LAP 18X18 RF (DISPOSABLE) ×8 IMPLANT
SUT ETHILON 3-0 FS-10 30 BLK (SUTURE) ×4
SUT MNCRL 4-0 (SUTURE) ×4
SUT MNCRL 4-0 27XMFL (SUTURE) ×4
SUT PDS AB 0 CT1 27 (SUTURE) ×8 IMPLANT
SUT SILK 2 0SH CR/8 30 (SUTURE) ×4 IMPLANT
SUT SILK 3 0 SH 30 (SUTURE) ×4 IMPLANT
SUT SILK 3-0 (SUTURE) ×4 IMPLANT
SUT VIC AB 2-0 SH 27 (SUTURE) ×4
SUT VIC AB 2-0 SH 27XBRD (SUTURE) ×4 IMPLANT
SUT VIC AB 3-0 SH 27 (SUTURE) ×4
SUT VIC AB 3-0 SH 27X BRD (SUTURE) ×4 IMPLANT
SUTURE EHLN 3-0 FS-10 30 BLK (SUTURE) ×2 IMPLANT
SUTURE MNCRL 4-0 27XMF (SUTURE) ×4 IMPLANT
SWAB CULTURE AMIES ANAERIB BLU (MISCELLANEOUS) IMPLANT
SYR 10ML LL (SYRINGE) ×4 IMPLANT
SYR BULB IRRIG 60ML STRL (SYRINGE) ×4 IMPLANT
TUBE JEJUNO 18X14 (TUBING) ×4 IMPLANT

## 2019-09-08 NOTE — Progress Notes (Signed)
PROGRESS NOTE    [name]  TFT:[id number] DOB: 09-Oct-1933 DOA: 08/24/2019   PCP: [name], MD    Brief Narrative: Albert [name] is 86 y.o. male with PMH of CKD, HTN, recent GI bleed presents from SNF for fever and hypotension. He is admitted for sepsis secondary to possible aspiration pneumonia and C. difficile colitis.  He completed antibiotics for Aspiration pneumonia. He is on PO vancomycin through NG tube. Given dysphagia he underwent thorough workup,  Neurology thinks it secondary to autoimmune myopathies but there is concern about myositis or dermatomyositis,  Neurology recommended Muscle biopsy  And close rheumatology Out patient follow-up.  Patient underwent  upper esophagoscopy with  esophageal sphincter dilatation. Palliative care consulted to discuss goals of care,  Patient want full scope of care.  Patient is at very high risk for aspiration,  Patient has a NG tube placed by IR.  He is found to have small PE on CT chest,  Venous duplex negative for DVT. Patient has hx. Of GI bleeding ,  Vascular Surgery consulted, he underwent IVC filter placement.  Patient underwent thoracocentesis for pleural effusion , cytology for diagnosis.  Plan: Patient will need PEG tube for nutrition,  Patient will require muscle biopsy for confirmation of diagnosis.   Assessment & Plan:   Active Problems:   Elevated troponin   Benign essential hypertension   CKD (chronic kidney disease), stage III   Sepsis (HCC)   Lactic acidosis   Anemia   Elevated brain natriuretic peptide (BNP) level   Aspiration pneumonia of right lower lobe due to gastric secretions (HCC)   Dysphagia   C. difficile colitis   Ulcer of esophagus without bleeding   Hypomagnesemia   Hypokalemia   Advanced care planning/counseling discussion   Goals of care, counseling/discussion   Autoimmune disease ([place])   Palliative care by specialist  Cardiac arrhythmia/nonsustained V. Tach This morning.  Anesthesia  requested cardiac clearance before taking him to the OR for PEG tube placement and muscle biopsy Cardiology cleared him.  No further cardiac work-up needed  1. Sepsis, present on admission with hypotension.  This has improved.  Completed treatment for Aspiration pneumonia with Unasyn (day 5 of 5),          C. difficile colitis on vancomycin (day 9/10) unfortunately patient's Dobbhoff got clogged so he did not receive 2 doses of vancomycin -vancomycin can be restarted while PEG tube once in place.  2.  C.difficile colitis.  Vancomycin via NG tube (day 9/10).  3. Dysphagia with failed swallow evaluation with aspiration on esophagram and modified barium swallow. 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  Recommended labs and close outpatient rheumatology follow-up.      ENT; Patient underwent rigid esophagoscopy with dilatation of the stricture.  Tolerated well.        Interventional radiology will not be able to do PEG tube placement based on the CT scan finding today on 9/2 showing completely trapped transverse colon      Currently has NG tube. Resumed  tube feeding or trial of swallowing after EUS dilation.  Patient's DHT is clogged on 9/2 and nursing was unable to unclog it.      Patient failed modified barium swallow, speech recommended n.p.o. alternate ways of feeding.     Dr. Para March is planning to do muscle biopsy and  PEG tube placement today on 9/3      IV methylprednisolone for day 3/5   4.  Bilateral pleural effusion: s/p thoracocentesis by IR on 8/31 with removal of 1.1 L of fluid, cytology negative for malignancy  5.  Small PE: Patient is found to have small PE on CT chest, venous duplex negative, s/p IVC filter placement by vascular surgery  4. Hyperlipidemia.  Hold atorvastatin.  5. Hypomagnesemia >>>    Repleted and resolved  6. Hyperkalemia  >>> now resolved status post Lokelma  7. Chronic kidney disease stage II: Renal functions back to base line.   8. Iron deficiency anemia with prior blood loss, esophageal ulcer on recent endoscopy.  Hemoglobin on the lower side but stable.  Hemoglobin 9.2 today  9. Smudge cells seen on CBC.  Hematology consulted, recommended  outpatient  Workup . Repeat labs: No smudge cells.  10.  Esophageal ulcer and esophagitis on Protonix.   DVT prophylaxis: Lovenox on hold for procedure today Code Status:  Full Family Communication: No one at bed side, D/W patient. Disposition Plan: Dispo: The patient is from: Rehab  Anticipated d/c is to: Rehab  Anticipated d/c date is: Getting PEG tube placed today along with muscle biopsy.  Will need SNF at discharge, could be next week  Patient is not medically clear given ongoing work-up.  Consultants:    ENT, Neurology, cardiology, surgery  Procedures:  PEG tube and muscle biopsy planned for today on 9/3  Antimicrobials:  Anti-infectives (From admission, onward)   Start     Dose/Rate Route Frequency Ordered Stop   09/08/19 0600  [MAR Hold]  ceFAZolin (ANCEF) IVPB 2g/100 mL premix        (MAR Hold since Fri 09/08/2019 at 1257.Hold Reason: Transfer to a Procedural area.)   2 g 200 mL/hr over 30 Minutes Intravenous On call to O.R. 09/07/19 1658 09/09/19 0559   09/05/19 0000  ceFAZolin (ANCEF) IVPB 2g/100 mL premix       Note to Pharmacy: To be given in specials   2 g 200 mL/hr over 30 Minutes Intravenous  Once 09/04/19 1146 09/04/19 2215   08/29/19 1800  [MAR Hold]  vancomycin (VANCOCIN) 50 mg/mL oral solution 125 mg        (MAR Hold since Fri 09/08/2019 at 1257.Hold Reason: Transfer to a Procedural area.)   125 mg Per Tube 4 times daily 08/29/19 1641 09/08/19 1759   08/28/19 1500  vancomycin (VANCOCIN) 500 mg in sodium chloride irrigation 0.9 % 100 mL ENEMA  Status:  Discontinued         500 mg Rectal Every 6 hours 08/28/19 1340 08/29/19 1641   08/28/19 1500  metroNIDAZOLE (FLAGYL) IVPB 500 mg  Status:  Discontinued        500 mg 100 mL/hr over 60 Minutes Intravenous Every 8 hours 08/28/19 1341 08/29/19 1642   08/26/19 1400  vancomycin (VANCOCIN) 50 mg/mL oral solution 125 mg  Status:  Discontinued        125 mg Oral 4 times daily 08/26/19 1255 08/28/19 1340   08/25/19 2000  vancomycin (VANCOCIN) IVPB 1000 mg/200 mL premix  Status:  Discontinued        1,000 mg 200 mL/hr over 60 Minutes Intravenous Every 24 hours 08/25/19 0240 08/25/19 1440   08/25/19 2000  doxycycline (VIBRA-TABS) tablet 100 mg  Status:  Discontinued        100 mg Oral Every 12 hours 08/25/19 1440 08/26/19 1254   08/25/19 1800  Ampicillin-Sulbactam (UNASYN)  3 g in sodium chloride 0.9 % 100 mL IVPB        3 g 200 mL/hr over 30 Minutes Intravenous Every 6 hours 08/25/19 1605 08/29/19 2336   08/25/19 1000  ceFEPIme (MAXIPIME) 2 g in sodium chloride 0.9 % 100 mL IVPB  Status:  Discontinued        2 g 200 mL/hr over 30 Minutes Intravenous Every 12 hours 08/25/19 0731 08/25/19 1548   08/25/19 0900  aztreonam (AZACTAM) 1 g in sodium chloride 0.9 % 100 mL IVPB  Status:  Discontinued        1 g 200 mL/hr over 30 Minutes Intravenous Every 8 hours 08/25/19 0228 08/25/19 0729   08/25/19 0015  aztreonam (AZACTAM) 2 g in sodium chloride 0.9 % 100 mL IVPB        2 g 200 mL/hr over 30 Minutes Intravenous  Once 08/25/19 0014 08/25/19 0115   08/25/19 0015  metroNIDAZOLE (FLAGYL) IVPB 500 mg  Status:  Discontinued        500 mg 100 mL/hr over 60 Minutes Intravenous Every 8 hours 08/25/19 0014 08/25/19 1548   08/25/19 0015  vancomycin (VANCOCIN) IVPB 1000 mg/200 mL premix        1,000 mg 200 mL/hr over 60 Minutes Intravenous  Once 08/25/19 0014 08/25/19 0222   08/24/19 2145  ceFEPIme (MAXIPIME) 2 g in sodium chloride 0.9 % 100 mL IVPB        2 g 200 mL/hr over 30 Minutes Intravenous  Once 08/24/19 2134 08/24/19 2222    08/24/19 2145  vancomycin (VANCOCIN) IVPB 1000 mg/200 mL premix        1,000 mg 200 mL/hr over 60 Minutes Intravenous  Once 08/24/19 2134 08/24/19 2251     Subjective: Wants to get Dobbhoff tube out, had isolated 10 beat run of nonsustained ventricular tachycardia, no symptoms.  Wanting to get PEG tube done and hope to get discharge soon  Objective: Vitals:   09/08/19 0611 09/08/19 0720 09/08/19 1122 09/08/19 1313  BP:  114/64 119/60 (!) 124/59  Pulse:  69 63 64  Resp: 19 17 17 16   Temp:  98.3 F (36.8 C) 98.1 F (36.7 C) (!) 97.1 F (36.2 C)  TempSrc:   Oral Temporal  SpO2:  100% 100% 99%  Weight:      Height:        Intake/Output Summary (Last 24 hours) at 09/08/2019 1326 Last data filed at 09/08/2019 1123 Gross per 24 hour  Intake 1808.41 ml  Output 1100 ml  Net 708.41 ml   Filed Weights   09/04/19 0349 09/05/19 0500 09/08/19 0531  Weight: 78.2 kg 74.8 kg 75 kg    Examination:  General exam: Appears calm and comfortable , NG tube in Rt nostril Respiratory system: Clear to auscultation. Respiratory effort normal. Cardiovascular system: S1 & S2 heard, RRR. No JVD, murmurs, rubs, gallops or clicks. No pedal edema. Gastrointestinal system: Abdomen is nondistended, soft and nontender. No organomegaly or masses felt. Normal bowel sounds heard. Central nervous system: Alert and oriented. No focal neurological deficits. Extremities: No leg edema, no cyanosis. Skin: No rashes, lesions or ulcers Psychiatry: Judgement and insight appear normal. Mood & affect appropriate.     Data Reviewed: I have personally reviewed following labs and imaging studies  CBC: Recent Labs  Lab 09/04/19 0429 09/05/19 0434 09/06/19 0452 09/07/19 0627 09/08/19 0400  WBC 9.7 9.4 10.1 3.5* 2.8*  NEUTROABS  --   --   --   --  2.1  HGB 9.1* 9.4* 8.7* 9.1* 9.2*  HCT 29.7* 29.2* 28.0* 27.7* 28.1*  MCV 92.5 88.2 91.5 87.1 86.5  PLT 163 177 157 114* 423   Basic Metabolic Panel: Recent Labs    Lab 09/02/19 0642 09/03/19 0600 09/03/19 0841 09/04/19 0429 09/04/19 0429 09/05/19 0434 09/05/19 0859 09/06/19 0452 09/07/19 0627 09/08/19 0400  NA 145   < >  --  141  --  140  --  137 136 135  K 4.4   < >  --  5.1   < > 5.3* 5.3* 5.4* 4.7 4.6  CL 120*   < >  --  115*  --  113*  --  110 108 108  CO2 18*   < >  --  22  --  23  --  21* 22 21*  GLUCOSE 130*   < >  --  108*  --  88  --  127* 159* 135*  BUN 21   < >  --  20  --  18  --  19 21 24*  CREATININE 1.03   < >  --  0.72  --  0.67  --  0.70 0.74 0.78  CALCIUM 7.4*   < >  --  7.3*  --  7.2*  --  7.5* 7.5* 7.6*  MG 1.5*  --  2.1 1.7  --  1.6*  --  1.9  --  1.9  PHOS 3.1  --   --  3.0  --  3.2  --  3.5  --   --    < > = values in this interval not displayed.   GFR: Estimated Creatinine Clearance: 63.7 mL/min (by C-G formula based on SCr of 0.78 mg/dL). Liver Function Tests: No results for input(s): AST, ALT, ALKPHOS, BILITOT, PROT, ALBUMIN in the last 168 hours. No results for input(s): LIPASE, AMYLASE in the last 168 hours. No results for input(s): AMMONIA in the last 168 hours. Coagulation Profile: Recent Labs  Lab 09/08/19 0400  INR 1.0   Cardiac Enzymes: No results for input(s): CKTOTAL, CKMB, CKMBINDEX, TROPONINI in the last 168 hours. BNP (last 3 results) No results for input(s): PROBNP in the last 8760 hours. HbA1C: No results for input(s): HGBA1C in the last 72 hours. CBG: No results for input(s): GLUCAP in the last 168 hours. Lipid Profile: No results for input(s): CHOL, HDL, LDLCALC, TRIG, CHOLHDL, LDLDIRECT in the last 72 hours. Thyroid Function Tests: No results for input(s): TSH, T4TOTAL, FREET4, T3FREE, THYROIDAB in the last 72 hours. Anemia Panel: No results for input(s): VITAMINB12, FOLATE, FERRITIN, TIBC, IRON, RETICCTPCT in the last 72 hours. Sepsis Labs: No results for input(s): PROCALCITON, LATICACIDVEN in the last 168 hours.  Recent Results (from the past 240 hour(s))  Body fluid culture      Status: None (Preliminary result)   Collection Time: 09/05/19  3:15 PM   Specimen: PATH Cytology Pleural fluid  Result Value Ref Range Status   Specimen Description   Final    PLEURAL Performed at [facility], [address] [place], [place] [address]    Special Requests   Final    NONE Performed at [facility], [address] [place], [place] [address]    Gram Stain   Final    FEW WBC PRESENT, PREDOMINANTLY MONONUCLEAR NO ORGANISMS SEEN    Culture   Final    NO GROWTH 3 DAYS Performed at [place] [address], [place], [place]  [address]    Report Status PENDING  Incomplete  Acid Fast Smear (AFB)     Status: None   Collection Time: 09/05/19  3:15 PM   Specimen: PATH Cytology Pleural fluid  Result Value Ref Range Status   AFB Specimen Processing Concentration  Final   Acid Fast Smear Negative  Final    Comment: (NOTE) Performed At: [facility] [place], [place] [id number] Rush Farmer MD HL:[id number]    Source (AFB) PLEURAL  Final    Comment: Performed at [facility], [address], [place], [place] [address]     Radiology Studies: CT ABDOMEN WO CONTRAST  Result Date: 09/07/2019 CLINICAL DATA:  Dysphagia and assessment for possible percutaneous gastrostomy tube placement. EXAM: CT ABDOMEN WITHOUT CONTRAST TECHNIQUE: Multidetector CT imaging of the abdomen was performed following the standard protocol without IV contrast. COMPARISON:  CT of the chest on 09/02/2019 FINDINGS: Lower chest: Significantly diminished right pleural effusion after recent thoracentesis. Small to moderate left pleural effusion. Hepatobiliary: No focal liver abnormality is seen. No gallstones, gallbladder wall thickening, or biliary dilatation. Pancreas: Unremarkable. No pancreatic ductal dilatation or surrounding inflammatory changes. Spleen: Normal in size without focal abnormality. Adrenals/Urinary Tract: Adrenal glands are  unremarkable. Kidneys are normal, without renal calculi, focal lesion, or hydronephrosis. Stomach/Bowel: Feeding tube present traversing the stomach and terminating in the transverse duodenum. No hiatal hernia. The stomach has a relative horizontal position and is almost completely located posterior to the transverse colon and splenic flexure. No evidence of bowel obstruction, visible bowel inflammation or free intraperitoneal air. Vascular/Lymphatic: Calcified plaque throughout the abdominal aorta without evidence of aneurysm. No enlarged lymph nodes identified in the abdomen. Other: No ascites or focal fluid collections. No hernias identified. Body wall edema present. Musculoskeletal: No acute or significant osseous findings. IMPRESSION: 1. Significantly diminished right pleural effusion after recent thoracentesis. 2. Small to moderate left pleural effusion. 3. The stomach has a relative horizontal position and is almost completely located posterior to the transverse colon and splenic flexure. The patient is not a candidate for percutaneous gastrostomy due to high risk of colonic injury. Electronically Signed   By: Aletta Edouard M.D.   On: 09/07/2019 16:32    Scheduled Meds: . [MAR Hold] feeding supplement (PROSource TF)  45 mL Per Tube Daily  . [MAR Hold] free water  100 mL Per Tube Q4H  . [MAR Hold] metoprolol tartrate  2.5 mg Intravenous Q12H  . [MAR Hold] pantoprazole (PROTONIX) IV  40 mg Intravenous Q24H  . [MAR Hold] sodium zirconium cyclosilicate  10 g Per Tube Daily  . [MAR Hold] vancomycin  125 mg Per Tube QID   Continuous Infusions: . [MAR Hold] sodium chloride 250 mL (08/29/19 0621)  . [MAR Hold]  ceFAZolin (ANCEF) IV    . dextrose 5 % and 0.45% NaCl 50 mL/hr at 09/08/19 0600  . feeding supplement (OSMOLITE 1.5 CAL) 1,000 mL (09/06/19 1417)  . [MAR Hold] methylPREDNISolone (SOLU-MEDROL) injection 500 mg (09/07/19 2230)     LOS: 14 days    Time spent: 35 mins.   Max Sane,  MD [facility]   If 7PM-7AM, please contact night-coverage

## 2019-09-08 NOTE — Progress Notes (Signed)
Physical Therapy Treatment Patient Details Name: Albert [name] MRN: [id number] DOB: 1933/09/11 Today's Date: 09/08/2019    History of Present Illness Albert [name] is 86 y.o. male with PMH of CKD, HTN, HLD with recent admission for GI bleed and urosepsis presents from SNF with concerns for fever and hypotensive. Admitted for sepsis and aspiration pneunonia. Also with C. diff. Wide-complex tachycardia secondary to sepsis. Pt also underwent IVC filter placement for PE, and NG tube placement. Several recent hospitalizations.    PT Comments    Pt alert, motivated for therapy. Supine LE exercises performed physical assist needed and RLE exhibited less strength than LLE. Prior to mobilizing EOB, rolling performed to address BM. modAx2 with use of bed rails and extended time to allow pt to maximize pt participation. Supine to sit with modAx2 and HOB elevated, pt cued for step by step sequencing, maxAx2 to reposition in neutral sitting EOB. Pt left sitting EOB with OT at bedside. The patient would benefit from further skilled PT intervention to continue to progress towards goals. Recommendation remains appropriate.    Follow Up Recommendations  SNF     Equipment Recommendations  None recommended by PT    Recommendations for Other Services OT consult     Precautions / Restrictions Precautions Precautions: Fall Precaution Comments: C.Diff Restrictions Weight Bearing Restrictions: No Other Position/Activity Restrictions: skin rash/sensitivity    Mobility  Bed Mobility Overal bed mobility: Needs Assistance Bed Mobility: Rolling;Supine to Sit Rolling: Mod assist   Supine to sit: Mod assist;+2 for safety/equipment;HOB elevated     General bed mobility comments: cued for hand placement, extended time given to maximize pt participation  Transfers                 General transfer comment: unable  Ambulation/Gait                 Stairs             Wheelchair  Mobility    Modified Rankin (Stroke Patients Only)       Balance Overall balance assessment: Needs assistance Sitting-balance support: Feet supported Sitting balance-Leahy Scale: Fair Sitting balance - Comments: intermittently able to reach and use UEs while sitting once positioned in neutral                                    Cognition Arousal/Alertness: Awake/alert Behavior During Therapy: WFL for tasks assessed/performed Overall Cognitive Status: Within Functional Limits for tasks assessed                                        Exercises General Exercises - Lower Extremity Ankle Circles/Pumps: AROM;15 reps;Both Short Arc Quad: AROM;Strengthening;Both;15 reps Heel Slides: AROM;Strengthening;Both;15 reps Hip ABduction/ADduction: AROM;Strengthening;Both;15 reps Straight Leg Raises: AAROM;Strengthening;Both;15 reps Other Exercises Other Exercises: rolling to change bed sheets/chucks, modA with 2 person assist.    General Comments        Pertinent Vitals/Pain Pain Assessment: Faces Faces Pain Scale: Hurts little more Pain Location: back/skin, swollen feet. Pain Descriptors / Indicators: Grimacing;Tender;Moaning Pain Intervention(s): Limited activity within patient's tolerance;Monitored during session;Repositioned    Home Living                      Prior Function  PT Goals (current goals can now be found in the care plan section) Progress towards PT goals: Progressing toward goals (slowly)    Frequency    Min 2X/week      PT Plan Current plan remains appropriate    Co-evaluation              AM-PAC PT "6 Clicks" Mobility   Outcome Measure  Help needed turning from your back to your side while in a flat bed without using bedrails?: A Lot Help needed moving from lying on your back to sitting on the side of a flat bed without using bedrails?: A Lot Help needed moving to and from a bed to a chair  (including a wheelchair)?: Total Help needed standing up from a chair using your arms (e.g., wheelchair or bedside chair)?: Total Help needed to walk in hospital room?: Total Help needed climbing 3-5 steps with a railing? : Total 6 Click Score: 8    End of Session   Activity Tolerance: Patient tolerated treatment well Patient left:  (sitting EOB with OT) Nurse Communication: Mobility status PT Visit Diagnosis: Other abnormalities of gait and mobility (R26.89);Muscle weakness (generalized) (M62.81);Difficulty in walking, not elsewhere classified (R26.2);Pain Pain - Right/Left: Right Pain - part of body: Knee     Time: 0920-0947 PT Time Calculation (min) (ACUTE ONLY): 27 min  Charges:  $Therapeutic Exercise: 23-37 mins                    Lieutenant Diego PT, DPT 10:41 AM,09/08/19

## 2019-09-08 NOTE — Brief Op Note (Signed)
09/08/2019  4:47 PM  PATIENT:  Albert [name]  86 y.o. male  PRE-OPERATIVE DIAGNOSIS: muscle weakening, dysphagia  POST-OPERATIVE DIAGNOSIS:  muscle weakening, dysphagia   PROCEDURE:  Procedure(s): MUSCLE BIOPSY (Right) INSERTION OF GASTROSTOMY TUBE (N/A)  SURGEON:  Surgeon(s) and Role:    * Shilee Biggs, MD - Primary    * Nestor Lewandowsky, MD - Assisting  ANESTHESIA:   general  EBL: 25 ml  BLOOD ADMINISTERED:none  DRAINS: Gastrostomy Tube   LOCAL MEDICATIONS USED:  BUPIVICAINE   SPECIMEN:  Source of Specimen:  right vastus lateralis muscle  DISPOSITION OF SPECIMEN:  PATHOLOGY  COUNTS:  YES  TOURNIQUET:  * No tourniquets in log *  DICTATION: .Dragon Dictation  PLAN OF CARE: transfer back to floor, continue medical management  PATIENT DISPOSITION:  PACU - hemodynamically stable.   Delay start of Pharmacological VTE agent (>24hrs) due to surgical blood loss or risk of bleeding: yes

## 2019-09-08 NOTE — Progress Notes (Signed)
Progress Note  Patient Name: Albert [name] Date of Encounter: 09/08/2019  Primary Cardiologist: New to [facility] - consult by Fletcher Anon  Subjective   He was admitted on 8/19 with PNA and C diff colitis. We were initially consulted on 8/20 for concern of atrial/ventricular arrhythmia vs IVCD. Echo showed preserved LVSF with no further workup needed. He has been monitored on telemetry this admission with a noted isolated 10 beat run of NSVT around 2 AM today. No chest pain, dyspnea, palpitations, dizziness, presyncope, or syncope. Potassium at goal. He is for muscle biopsy later today for further evaluation of dysphagia.   Inpatient Medications    Scheduled Meds: . feeding supplement (PROSource TF)  45 mL Per Tube Daily  . free water  100 mL Per Tube Q4H  . metoprolol tartrate  2.5 mg Intravenous Q12H  . pantoprazole (PROTONIX) IV  40 mg Intravenous Q24H  . sodium zirconium cyclosilicate  10 g Per Tube Daily  . vancomycin  125 mg Per Tube QID   Continuous Infusions: . sodium chloride 250 mL (08/29/19 0621)  .  ceFAZolin (ANCEF) IV    . dextrose 5 % and 0.45% NaCl 50 mL/hr at 09/08/19 0600  . feeding supplement (OSMOLITE 1.5 CAL) 1,000 mL (09/06/19 1417)  . methylPREDNISolone (SOLU-MEDROL) injection 500 mg (09/07/19 2230)   PRN Meds: sodium chloride, acetaminophen **OR** acetaminophen, liver oil-zinc oxide, ondansetron (ZOFRAN) IV   Vital Signs    Vitals:   09/08/19 0100 09/08/19 0531 09/08/19 0611 09/08/19 0720  BP:  121/82  114/64  Pulse:  65  69  Resp: 12 16 19 17   Temp:  98.4 F (36.9 C)  98.3 F (36.8 C)  TempSrc:  Axillary    SpO2:  100%  100%  Weight:  75 kg    Height:        Intake/Output Summary (Last 24 hours) at 09/08/2019 0918 Last data filed at 09/08/2019 0700 Gross per 24 hour  Intake 1808.41 ml  Output 650 ml  Net 1158.41 ml   Filed Weights   09/04/19 0349 09/05/19 0500 09/08/19 0531  Weight: 78.2 kg 74.8 kg 75 kg    Telemetry    SR with a 10-beat run  of NSVT around 2 AM today - Personally Reviewed  ECG    No new tracings - Personally Reviewed  Physical Exam   GEN: Chronically ill appearing; no acute distress.   Neck: No JVD. Cardiac: RRR, no murmurs, rubs, or gallops.  Respiratory: Clear to auscultation bilaterally.  GI: Soft, nontender, non-distended.   MS: 1-2+ pitting edema with scrotal swelling noted; No deformity. Neuro:  Alert and oriented x 3; Nonfocal.  Psych: Normal affect.  Labs    Chemistry Recent Labs  Lab 09/06/19 0452 09/07/19 0627 09/08/19 0400  NA 137 136 135  K 5.4* 4.7 4.6  CL 110 108 108  CO2 21* 22 21*  GLUCOSE 127* 159* 135*  BUN 19 21 24*  CREATININE 0.70 0.74 0.78  CALCIUM 7.5* 7.5* 7.6*  GFRNONAA >60 >60 >60  GFRAA >60 >60 >60  ANIONGAP 6 6 6      Hematology Recent Labs  Lab 09/06/19 0452 09/07/19 0627 09/08/19 0400  WBC 10.1 3.5* 2.8*  RBC 3.06* 3.18* 3.25*  HGB 8.7* 9.1* 9.2*  HCT 28.0* 27.7* 28.1*  MCV 91.5 87.1 86.5  MCH 28.4 28.6 28.3  MCHC 31.1 32.9 32.7  RDW 18.9* 18.5* 18.6*  PLT 157 114* 150    Cardiac EnzymesNo results for input(s): TROPONINI  in the last 168 hours. No results for input(s): TROPIPOC in the last 168 hours.   BNPNo results for input(s): BNP, PROBNP in the last 168 hours.   DDimer No results for input(s): DDIMER in the last 168 hours.   Radiology    CT ABDOMEN WO CONTRAST  Result Date: 09/07/2019 IMPRESSION: 1. Significantly diminished right pleural effusion after recent thoracentesis. 2. Small to moderate left pleural effusion. 3. The stomach has a relative horizontal position and is almost completely located posterior to the transverse colon and splenic flexure. The patient is not a candidate for percutaneous gastrostomy due to high risk of colonic injury. Electronically Signed   By: Aletta Edouard M.D.   On: 09/07/2019 16:32    Cardiac Studies   2D echo 08/06/2019: 1. Left ventricular ejection fraction, by estimation, is 60 to 65%. The  left  ventricle has normal function. The left ventricle has no regional  wall motion abnormalities. There is mild left ventricular hypertrophy.  Left ventricular diastolic parameters  are consistent with Grade I diastolic dysfunction (impaired relaxation).  2. Right ventricular systolic function is normal. The right ventricular  size is moderately enlarged.  3. Left atrial size was severely dilated.  4. Right atrial size was mildly dilated.  5. The mitral valve is normal in structure. Mild mitral valve  regurgitation. No evidence of mitral stenosis.  6. The aortic valve has an indeterminant number of cusps. Aortic valve  regurgitation is mild. Mild to moderate aortic valve stenosis.   Patient Profile     86 y.o. male with history of COVID-19 infection in winter of 2020, CKD stage III, recurrent GI bleeding, cancer, PUD, HTN, HLD and PUD who was admitted on 08/24/2019 from SNF with PNA and C diff colitis whom we were initially consulted on 8/21 for intermittent tachycardia concerning for SVT vs VT with possible Afib without objective evidence of atrial arrhythmia noted.   Assessment & Plan    1. Preprocedure cardiac evaluation: -Patient is scheduled for muscle biopsy and PEG tube placement later today -Revised Cardiac Risk Index: Low risk for noncardiac procedure -Duke Activity Status Index: difficult to assess at this time -No further cardiac testing is needed at this time and he may proceed at overall low cardiac risk  2. NSVT: -Asymptomatic  -Isolated 10 beat run of NSVT this morning around 2 AM -Echo 08/06/2019 demonstrated preserved LVSF -Potassium at goal -Magnesium pending -TSH normal this admission  -Possibly in the setting of his acute illness and underlying anemia  -With dysphagia, he is not taking oral medication -Continue IV Lopressor   3. Small PE: -Status post IVC filter placement this admission -Per IM and vascular   For questions or updates, please contact [place] Please consult [email or web address] for contact info under Cardiology/STEMI.    Signed, Christell Faith, PA-C [place] Pager: [phone] 09/08/2019, 9:18 AM

## 2019-09-08 NOTE — Progress Notes (Signed)
09/08/2019  Subjective:  Patient had CT scan yesterday for anatomy eval and has bowel overlying the stomach, which would put him at high risk for bowel injury.    Patient also had run of VT earlier this morning but was asymptomatic.  Anesthesia team has asked for cardiology consult.  Patient reports some back pain.  Has been NPO.  Vital signs: Temp:  [97.6 F (36.4 C)-98.4 F (36.9 C)] 98.3 F (36.8 C) (09/03 0720) Pulse Rate:  [65-79] 69 (09/03 0720) Resp:  [12-20] 17 (09/03 0720) BP: (113-137)/(48-82) 114/64 (09/03 0720) SpO2:  [98 %-100 %] 100 % (09/03 0720) Weight:  [75 kg] 75 kg (09/03 0531)   Intake/Output: 09/02 0701 - 09/03 0700 In: 1808.4 [I.V.:1704.4; IV Piggyback:104] Out: 1400 [Urine:1350; Emesis/NG output:50] Last BM Date: 09/07/19  Physical Exam: Constitutional: No acute distress Abdomen:  Soft, non-distended, non-tender.  Labs:  Recent Labs    09/07/19 0627 09/08/19 0400  WBC 3.5* 2.8*  HGB 9.1* 9.2*  HCT 27.7* 28.1*  PLT 114* 150   Recent Labs    09/07/19 0627 09/08/19 0400  NA 136 135  K 4.7 4.6  CL 108 108  CO2 22 21*  GLUCOSE 159* 135*  BUN 21 24*  CREATININE 0.74 0.78  CALCIUM 7.5* 7.6*   Recent Labs    09/08/19 0400  LABPROT 13.1  INR 1.0    Imaging: CT ABDOMEN WO CONTRAST  Result Date: 09/07/2019 CLINICAL DATA:  Dysphagia and assessment for possible percutaneous gastrostomy tube placement. EXAM: CT ABDOMEN WITHOUT CONTRAST TECHNIQUE: Multidetector CT imaging of the abdomen was performed following the standard protocol without IV contrast. COMPARISON:  CT of the chest on 09/02/2019 FINDINGS: Lower chest: Significantly diminished right pleural effusion after recent thoracentesis. Small to moderate left pleural effusion. Hepatobiliary: No focal liver abnormality is seen. No gallstones, gallbladder wall thickening, or biliary dilatation. Pancreas: Unremarkable. No pancreatic ductal dilatation or surrounding inflammatory changes. Spleen:  Normal in size without focal abnormality. Adrenals/Urinary Tract: Adrenal glands are unremarkable. Kidneys are normal, without renal calculi, focal lesion, or hydronephrosis. Stomach/Bowel: Feeding tube present traversing the stomach and terminating in the transverse duodenum. No hiatal hernia. The stomach has a relative horizontal position and is almost completely located posterior to the transverse colon and splenic flexure. No evidence of bowel obstruction, visible bowel inflammation or free intraperitoneal air. Vascular/Lymphatic: Calcified plaque throughout the abdominal aorta without evidence of aneurysm. No enlarged lymph nodes identified in the abdomen. Other: No ascites or focal fluid collections. No hernias identified. Body wall edema present. Musculoskeletal: No acute or significant osseous findings. IMPRESSION: 1. Significantly diminished right pleural effusion after recent thoracentesis. 2. Small to moderate left pleural effusion. 3. The stomach has a relative horizontal position and is almost completely located posterior to the transverse colon and splenic flexure. The patient is not a candidate for percutaneous gastrostomy due to high risk of colonic injury. Electronically Signed   By: Aletta Edouard M.D.   On: 09/07/2019 16:32    Assessment/Plan: This is a 86 y.o. male with dysphagia and progressive muscular weakness.  --Discussed again with patient that we would proceed today with an open gastrostomy tube placement as well as right thigh muscle biopsy.  Discussed with him again the risks of bleeding, infection, injury to surrounding structures. --Cleared by cardiology for surgery today.   Melvyn Neth, [facility]

## 2019-09-08 NOTE — Anesthesia Postprocedure Evaluation (Signed)
Anesthesia Post Note  Patient: Albert [name]  Procedure(s) Performed: MUSCLE BIOPSY (Right ) INSERTION OF GASTROSTOMY TUBE (N/A )  Patient location during evaluation: PACU Anesthesia Type: General Level of consciousness: awake and alert Pain management: pain level controlled Vital Signs Assessment: post-procedure vital signs reviewed and stable Respiratory status: spontaneous breathing, nonlabored ventilation, respiratory function stable and patient connected to nasal cannula oxygen Cardiovascular status: blood pressure returned to baseline and stable Postop Assessment: no apparent nausea or vomiting Anesthetic complications: no   No complications documented.   Last Vitals:  Vitals:   09/08/19 1727 09/08/19 1752  BP: (!) 139/51 (!) 143/76  Pulse: 80 88  Resp: 18 17  Temp: (!) 36.1 C 36.6 C  SpO2: 93% 97%    Last Pain:  Vitals:   09/08/19 1752  TempSrc: Oral  PainSc:                  Precious Haws [name]

## 2019-09-08 NOTE — Progress Notes (Signed)
Patient had 10 beats of VT and he was asymptomatic on reassessment. Notified [place] NP and she indicated will check labs. Will continue to monitor.

## 2019-09-08 NOTE — Anesthesia Procedure Notes (Signed)
Procedure Name: Intubation Date/Time: 09/08/2019 2:24 PM Performed by: Babs Sciara, CRNA Pre-anesthesia Checklist: Patient identified, Emergency Drugs available, Suction available, Patient being monitored and Timeout performed Patient Re-evaluated:Patient Re-evaluated prior to induction Oxygen Delivery Method: Circle system utilized Preoxygenation: Pre-oxygenation with 100% oxygen Induction Type: IV induction Ventilation: Mask ventilation without difficulty and Oral airway inserted - appropriate to patient size Laryngoscope Size: McGraph and 3 Grade View: Grade I Tube type: Oral Number of attempts: 1 Airway Equipment and Method: Stylet Placement Confirmation: positive ETCO2 and breath sounds checked- equal and bilateral Secured at: 21 (lip --tube holder) cm Tube secured with: Tape Dental Injury: Teeth and Oropharynx as per pre-operative assessment

## 2019-09-08 NOTE — Anesthesia Preprocedure Evaluation (Addendum)
Anesthesia Evaluation  Patient identified by MRN, date of birth, ID band Patient awake    Reviewed: Allergy & Precautions, H&P , NPO status , Patient's Chart, lab work & pertinent test results  History of Anesthesia Complications Negative for: history of anesthetic complications  Airway Mallampati: II  TM Distance: >3 FB     Dental  (+) Edentulous Lower, Edentulous Upper   Pulmonary neg sleep apnea, pneumonia,    breath sounds clear to auscultation- rhonchi (-) wheezing      Cardiovascular Exercise Tolerance: Good hypertension, Pt. on medications (-) angina(-) Past MI and (-) Cardiac Stents (-) dysrhythmias (-) Valvular Problems/Murmurs Rhythm:regular Rate:Normal - Systolic murmurs and - Diastolic murmurs    Neuro/Psych neg Seizures negative neurological ROS  negative psych ROS   GI/Hepatic Neg liver ROS, PUD,   Endo/Other  negative endocrine ROSneg diabetes  Renal/GU Renal disease (AKI)  negative genitourinary   Musculoskeletal negative musculoskeletal ROS (+)   Abdominal (+) - obese,   Peds  Hematology  (+) Blood dyscrasia, anemia ,   Anesthesia Other Findings Past Medical History: 04/23/2008: Actinic keratosis     Comment:  L elbow distal tricep (bx proven) 06/17/2006: Basal cell carcinoma     Comment:  L preauricular  05/26/2012: Basal cell carcinoma     Comment:  L ant neck  11/02/2012: Basal cell carcinoma     Comment:  Glabella - excision  08/15/2014: Basal cell carcinoma     Comment:  L med ankle  02/23/2017: Basal cell carcinoma     Comment:  R lat infrapectoral - excision 12/08/2017: Basal cell carcinoma     Comment:  L mid to distal lat volar forearm  No date: Hypertension 02/14/2014: Squamous cell carcinoma of skin     Comment:  R lat low back - ED&C 10/02/2014: Squamous cell carcinoma of skin     Comment:  R prox lat bicep - ED&C  History reviewed. No pertinent surgical history.  BMI     Body Mass Index: 22.71 kg/m      Reproductive/Obstetrics negative OB ROS                           Anesthesia Physical  Anesthesia Plan  ASA: III  Anesthesia Plan: General   Post-op Pain Management:    Induction: Intravenous  PONV Risk Score and Plan: 1 and Ondansetron and Dexamethasone  Airway Management Planned: Oral ETT  Additional Equipment:   Intra-op Plan:   Post-operative Plan: Extubation in OR  Informed Consent: I have reviewed the patients History and Physical, chart, labs and discussed the procedure including the risks, benefits and alternatives for the proposed anesthesia with the patient or authorized representative who has indicated his/her understanding and acceptance.     Dental advisory given  Plan Discussed with: CRNA and Anesthesiologist  Anesthesia Plan Comments:        Anesthesia Quick Evaluation

## 2019-09-08 NOTE — Progress Notes (Signed)
Patient unavailable in dialysis. Went to patients room numerous times throughout the day. Unable to do NIF and VC.

## 2019-09-08 NOTE — Evaluation (Signed)
Occupational Therapy Re-Evaluation Patient Details Name: CID AGENA MRN: [id number] DOB: May 03, 1933 Today's Date: 09/08/2019    History of Present Illness NEO YEPIZ is 86 y.o. male with PMH of CKD, HTN, HLD with recent admission for GI bleed and urosepsis presents from SNF with concerns for fever and hypotensive. Admitted for sepsis and aspiration pneunonia. Also with C. diff. Wide-complex tachycardia secondary to sepsis. Pt also underwent IVC filter placement for PE, and NG tube placement. Several recent hospitalizations.   Clinical Impression   Pt seen for Re-evaluation in setting of prolonged hospitalization. Pt requires MOD +2 for sup to sit to EOB, tolerates sitting EOB x20 mins with F static sitting balance, and requires MAX A for sit to sup. OT engages pt in seated UB ADLs including grooming with MIN A and bathing with MOD A d/t pt's low fxl activity tolerance. Pt continues to demo gross weakness, but reports decreased pain with sitting bouts. Pt continues to benefit from skilled OT in acute setting and continue to anticipate SNF will be required upon d/c.     Follow Up Recommendations  SNF    Equipment Recommendations  Other (comment)    Recommendations for Other Services       Precautions / Restrictions Precautions Precautions: Fall Precaution Comments: C.Diff Restrictions Weight Bearing Restrictions: No Other Position/Activity Restrictions: skin rash/sensitivity      Mobility Bed Mobility Overal bed mobility: Needs Assistance Bed Mobility: Rolling;Supine to Sit;Sit to Supine Rolling: Mod assist   Supine to sit: Mod assist;+2 for safety/equipment;HOB elevated Sit to supine: Max assist   General bed mobility comments: cued for hand placement, extended time given to maximize pt participation  Transfers                 General transfer comment: unable    Balance Overall balance assessment: Needs assistance Sitting-balance support: Feet  supported Sitting balance-Leahy Scale: Fair Sitting balance - Comments: intermittently able to reach and use UEs while sitting once positioned in neutral, mostly requires UE to sustain sit       Standing balance comment: NT - uanble to attempt                           ADL either performed or assessed with clinical judgement   ADL Overall ADL's : Needs assistance/impaired     Grooming: Wash/dry hands;Wash/dry face;Oral care;Sitting;Minimal assistance Grooming Details (indicate cue type and reason): MIN A for thorough completion Upper Body Bathing: Moderate assistance;Maximal assistance;Sitting                   Toileting- Clothing Manipulation and Hygiene: Maximal assistance;Bed level Toileting - Clothing Manipulation Details (indicate cue type and reason): using rolling technique, able to minimally contribute to anterior peri care             Vision Baseline Vision/History: Wears glasses Wears Glasses: At all times Patient Visual Report: No change from baseline       Perception     Praxis      Pertinent Vitals/Pain Pain Assessment: Faces Faces Pain Scale: Hurts little more Pain Location: back/skin, swollen feet. Pain Descriptors / Indicators: Grimacing;Tender;Moaning Pain Intervention(s): Monitored during session;Limited activity within patient's tolerance;Repositioned     Hand Dominance Right   Extremity/Trunk Assessment Upper Extremity Assessment Upper Extremity Assessment: Generalized weakness (edema B'ly)   Lower Extremity Assessment Lower Extremity Assessment: Defer to PT evaluation;Generalized weakness (limited flex/ext of hips/knees and DF/PF ankles)   Cervical /  Trunk Assessment Cervical / Trunk Assessment: Kyphotic   Communication Communication Communication: No difficulties   Cognition Arousal/Alertness: Awake/alert Behavior During Therapy: WFL for tasks assessed/performed Overall Cognitive Status: Within Functional Limits for  tasks assessed                                 General Comments: somewhat groggy at first, but increased attn and energy once assisted to seated position   General Comments       Exercises Other Exercises Other Exercises: Pt with low fxl activity tolerance, benefits from partial overlap of PT/OT treatment to address strenghening, bed mobility and self care. Pt tolerates EOB sitting ~20 mins whil engaging in bathing and grooming and reports feeling better with his back off the bed temporarily. Reports less irritated skin and less back pain.   Shoulder Instructions      Home Living Family/patient expects to be discharged to:: Skilled nursing facility Living Arrangements: Alone Available Help at Discharge: Family;Available PRN/intermittently;Friend(s) Type of Home: House Home Access: Stairs to enter [employer] of Steps: 1 (3 inch) step up Entrance Stairs-Rails: Right;Left Home Layout: Able to live on main level with bedroom/bathroom     Bathroom Shower/Tub: Occupational psychologist: Handicapped height     Home Equipment: Environmental consultant - 2 wheels;Cane - single point;Shower seat - built in;Grab bars - tub/shower;Grab bars - toilet;Bedside commode;Walker - 4 wheels;Hand held shower head   Additional Comments: states he no longer has a lift chair      Prior Functioning/Environment Level of Independence: Needs assistance  Gait / Transfers Assistance Needed: Per past documentation pt ambulates with SPC vs rollator vs 2WW depending on pt's knee pain. Pateint reports he used to ambulate outside with [facility] prior to getting sick with GI illnesses. ADL's / Homemaking Assistance Needed: Patient reports he used to drive and be able to do everything for himself until recent GI illness. He states he has freinds and family that helps him with whatevr he needs now. He speaks of a caregiver but with further questionning states various family and freinds help him whenever he  needs it. Prior documentation states: 3x's per week pt's cousin comes to assist as needed (with groceries, MD appts, etc); pt reports needing significantly more assist for bathing, dressing, and all IADL Tasks the last few months   Comments: No falls in past 5 years        OT Problem List: Decreased strength;Decreased range of motion;Pain;Decreased activity tolerance;Impaired balance (sitting and/or standing);Decreased knowledge of use of DME or AE      OT Treatment/Interventions: Self-care/ADL training;Therapeutic exercise;Therapeutic activities;DME and/or AE instruction;Patient/family education;Balance training    OT Goals(Current goals can be found in the care plan section) Acute Rehab OT Goals Patient Stated Goal: go back to rehab to get stronger before returning home OT Goal Formulation: With patient Time For Goal Achievement: 09/22/19 Potential to Achieve Goals: Good  OT Frequency: Min 1X/week   Barriers to D/C: Decreased caregiver support          Co-evaluation PT/OT/SLP Co-Evaluation/Treatment: Yes Reason for Co-Treatment: Complexity of the patient's impairments (multi-system involvement);For patient/therapist safety PT goals addressed during session: Mobility/safety with mobility;Balance OT goals addressed during session: ADL's and self-care;Proper use of Adaptive equipment and DME      AM-PAC OT "6 Clicks" Daily Activity     Outcome Measure Help from another person eating meals?: A Little Help from another person taking  care of personal grooming?: A Little Help from another person toileting, which includes using toliet, bedpan, or urinal?: A Lot Help from another person bathing (including washing, rinsing, drying)?: A Lot Help from another person to put on and taking off regular upper body clothing?: A Lot Help from another person to put on and taking off regular lower body clothing?: A Lot 6 Click Score: 14   End of Session Nurse Communication: Mobility  status  Activity Tolerance: Patient limited by fatigue;Patient limited by pain Patient left: in bed;with call bell/phone within reach;with bed alarm set;with nursing/sitter in room  OT Visit Diagnosis: Other abnormalities of gait and mobility (R26.89);Muscle weakness (generalized) (M62.81);Pain Pain - part of body:  (back/LEs)                Time: 7939-0300 OT Time Calculation (min): 35 min Charges:  OT General Charges $OT Visit: 1 Visit OT Evaluation $OT Re-eval: 1 Re-eval OT Treatments $Self Care/Home Management : 8-22 mins  Gerrianne Scale, MS, OTR/L ascom [phone] 09/08/19, 2:10 PM

## 2019-09-08 NOTE — Transfer of Care (Signed)
Immediate Anesthesia Transfer of Care Note  Patient: Albert [name]  Procedure(s) Performed: Procedure(s): MUSCLE BIOPSY (Right) INSERTION OF GASTROSTOMY TUBE (N/A)  Patient Location: PACU  Anesthesia Type:General  Level of Consciousness: sedated  Airway & Oxygen Therapy: Patient Spontanous Breathing and Patient connected to face mask oxygen  Post-op Assessment: Report given to RN and Post -op Vital signs reviewed and stable  Post vital signs: Reviewed and stable  Last Vitals:  Vitals:   09/08/19 1313 09/08/19 1657  BP: (!) 124/59 139/74  Pulse: 64 80  Resp: 16 (!) 9  Temp: (!) 36.2 C   SpO2: 43% 27%    Complications: No apparent anesthesia complications

## 2019-09-08 NOTE — Progress Notes (Signed)
[facility] attempted follow-up visit from visit earlier this week, but pt. gone from rm.  [facility] may attempt follow-up later next week, and chaplains remain available to support pt./family/staff needs.

## 2019-09-08 NOTE — Progress Notes (Addendum)
Pt just came back to floor from a PEG tube placement. Notify Ouma NP and states to just monitor  and not start tube feeding at this time. Will notify incoming shift. Will continue to monitor.  Update 0228: Pt axillary temp at 95.6 and rectal at 95.1. Pt is a mouth breather and can not get temp through oral. Warm blanket were apllied to pt. Notify Ouma. Will continue to monitor.  Update 0254: Talked to [facility] and states to applied warm blanket at this time. Warm blanket applied. Will continue to monitor.  Update 0354: pt temp axillary at 96

## 2019-09-08 NOTE — Progress Notes (Signed)
[facility] Hematology/Oncology Progress Note  Date of admission: 08/24/2019  Hospital day:  09/08/2019  Chief Complaint: Albert [name] is a 86 y.o. male with chronic kidney disease, and hypertension who was admitted through the emergency room with fever and hypotension.  Subjective:  He notes new lower sternal pain and pain associated with his recently placed G tube.  Social History: The patient is alone today.  Allergies: No Active Allergies  Scheduled Medications: . feeding supplement (PROSource TF)  45 mL Per Tube Daily  . free water  100 mL Per Tube Q4H  . metoprolol tartrate  2.5 mg Intravenous Q12H  . ondansetron      . pantoprazole (PROTONIX) IV  40 mg Intravenous Q24H  . sodium zirconium cyclosilicate  10 g Per Tube Daily    Review of Systems: GENERAL:  Ongoing fatigue.  No fevers, sweats or weight loss. PERFORMANCE STATUS (ECOG):  3 HEENT:  No visual changes, runny nose, sore throat, mouth sores or tenderness. Lungs: No shortness of breath or cough.  No hemoptysis. Cardiac:  No chest pain, palpitations, orthopnea, or PND. GI:  Difficulty swallowing. NG tube out.  Denies diarrhea. No nausea, vomiting, constipation, melena or hematochezia. GU:  No urgency, frequency, dysuria, or hematuria. Musculoskeletal:  Generalized weakness.  No back pain.  No joint pain.  No muscle tenderness. Extremities:  No pain or swelling. Skin:  No rashes or skin changes. Neuro:  No headache, numbness or weakness, balance or coordination issues. Endocrine:  No diabetes, thyroid issues, hot flashes or night sweats. Psych:  No mood changes, depression or anxiety. Pain:  Pain associated with G tube. Review of systems:  All other systems reviewed and found to be negative.  Physical Exam: Blood pressure 118/60, pulse 74, temperature (!) 95.6 F (35.3 C), resp. rate 19, height 5\' 4"  (1.626 m), weight 165 lb 5.5 oz (75 kg), SpO2 98 %.  GENERAL: Chronically ill appearing  gentleman lying comfortably on the medical unit in no acute distress. MENTAL STATUS:  Alert and oriented to person, place and time. HEAD: Short brown hair hair.  Normocephalic, atraumatic, face symmetric, no Cushingoid features. EYES:  Pupils equal round and reactive to light and accomodation.  No conjunctivitis or scleral icterus. ENT:  No oral lesions.  Tongue normal. Mucous membranes dry.  RESPIRATORY:  Clear to auscultation anteriorly with decreased breath sounds at the base.  No rales, wheezes or rhonchi. CARDIOVASCULAR:  Regular rate and rhythm without murmur, rub or gallop. ABDOMEN:  G tube in place.  Dressing intact.  Soft, tender near G tube placement.  No guarding or rebound tenderness. Active bowel sounds, and no hepatosplenomegaly.  No masses. SKIN: No rashes, ulcers or lesions. EXTREMITIES:  2+ tender bilateral lower extremity edema. NEUROLOGICAL: Weak.  Difficulty lifting arms. PSYCH:  Appropriate.   Results for orders placed or performed during the hospital encounter of 08/24/19 (from the past 48 hour(s))  CBC     Status: Abnormal   Collection Time: 09/07/19  6:27 AM  Result Value Ref Range   WBC 3.5 (L) 4.0 - 10.5 K/uL   RBC 3.18 (L) 4.22 - 5.81 MIL/uL   Hemoglobin 9.1 (L) 13.0 - 17.0 g/dL   HCT 27.7 (L) 39 - 52 %   MCV 87.1 80.0 - 100.0 fL   MCH 28.6 26.0 - 34.0 pg   MCHC 32.9 30.0 - 36.0 g/dL   RDW 18.5 (H) 11.5 - 15.5 %   Platelets 114 (L) 150 - 400 K/uL  nRBC 0.0 0.0 - 0.2 %    Comment: Performed at [facility], [address] [place], [place] [address]  Basic metabolic panel     Status: Abnormal   Collection Time: 09/07/19  6:27 AM  Result Value Ref Range   Sodium 136 135 - 145 mmol/L   Potassium 4.7 3.5 - 5.1 mmol/L   Chloride 108 98 - 111 mmol/L   CO2 22 22 - 32 mmol/L   Glucose, Bld 159 (H) 70 - 99 mg/dL    Comment: Glucose reference range applies only to samples taken after fasting for at least 8 hours.   BUN 21 8 - 23 mg/dL   Creatinine,  Ser 0.74 0.61 - 1.24 mg/dL   Calcium 7.5 (L) 8.9 - 10.3 mg/dL   GFR calc non Af Amer >60 >60 mL/min   GFR calc Af Amer >60 >60 mL/min   Anion gap 6 5 - 15    Comment: Performed at [facility], [address] [place], [place] [address]  Protime-INR     Status: None   Collection Time: 09/08/19  4:00 AM  Result Value Ref Range   Prothrombin Time 13.1 11.4 - 15.2 seconds   INR 1.0 0.8 - 1.2    Comment: (NOTE) INR goal varies based on device and disease states. Performed at [facility], [address] [place], [place] [address]   CBC with Differential/Platelet     Status: Abnormal   Collection Time: 09/08/19  4:00 AM  Result Value Ref Range   WBC 2.8 (L) 4.0 - 10.5 K/uL   RBC 3.25 (L) 4.22 - 5.81 MIL/uL   Hemoglobin 9.2 (L) 13.0 - 17.0 g/dL   HCT 28.1 (L) 39 - 52 %   MCV 86.5 80.0 - 100.0 fL   MCH 28.3 26.0 - 34.0 pg   MCHC 32.7 30.0 - 36.0 g/dL   RDW 18.6 (H) 11.5 - 15.5 %   Platelets 150 150 - 400 K/uL   nRBC 0.0 0.0 - 0.2 %   Neutrophils Relative % 76 %   Neutro Abs 2.1 1.7 - 7.7 K/uL   Lymphocytes Relative 18 %   Lymphs Abs 0.5 (L) 0.7 - 4.0 K/uL   Monocytes Relative 5 %   Monocytes Absolute 0.1 0 - 1 K/uL   Eosinophils Relative 0 %   Eosinophils Absolute 0.0 0 - 0 K/uL   Basophils Relative 0 %   Basophils Absolute 0.0 0 - 0 K/uL   Immature Granulocytes 1 %   Abs Immature Granulocytes 0.02 0.00 - 0.07 K/uL    Comment: Performed at [facility], [address], [place], [place] [address]  Basic metabolic panel     Status: Abnormal   Collection Time: 09/08/19  4:00 AM  Result Value Ref Range   Sodium 135 135 - 145 mmol/L   Potassium 4.6 3.5 - 5.1 mmol/L   Chloride 108 98 - 111 mmol/L   CO2 21 (L) 22 - 32 mmol/L   Glucose, Bld 135 (H) 70 - 99 mg/dL    Comment: Glucose reference range applies only to samples taken after fasting for at least 8 hours.   BUN 24 (H) 8 - 23 mg/dL   Creatinine, Ser 0.78 0.61 - 1.24 mg/dL   Calcium 7.6 (L)  8.9 - 10.3 mg/dL   GFR calc non Af Amer >60 >60 mL/min   GFR calc Af Amer >60 >60 mL/min   Anion gap 6 5 - 15  Comment: Performed at [facility], [address] [place], [place] [address]  Magnesium     Status: None   Collection Time: 09/08/19  4:00 AM  Result Value Ref Range   Magnesium 1.9 1.7 - 2.4 mg/dL    Comment: Performed at [facility], [address] [place], [place] [address]   CT ABDOMEN WO CONTRAST  Result Date: 09/07/2019 CLINICAL DATA:  Dysphagia and assessment for possible percutaneous gastrostomy tube placement. EXAM: CT ABDOMEN WITHOUT CONTRAST TECHNIQUE: Multidetector CT imaging of the abdomen was performed following the standard protocol without IV contrast. COMPARISON:  CT of the chest on 09/02/2019 FINDINGS: Lower chest: Significantly diminished right pleural effusion after recent thoracentesis. Small to moderate left pleural effusion. Hepatobiliary: No focal liver abnormality is seen. No gallstones, gallbladder wall thickening, or biliary dilatation. Pancreas: Unremarkable. No pancreatic ductal dilatation or surrounding inflammatory changes. Spleen: Normal in size without focal abnormality. Adrenals/Urinary Tract: Adrenal glands are unremarkable. Kidneys are normal, without renal calculi, focal lesion, or hydronephrosis. Stomach/Bowel: Feeding tube present traversing the stomach and terminating in the transverse duodenum. No hiatal hernia. The stomach has a relative horizontal position and is almost completely located posterior to the transverse colon and splenic flexure. No evidence of bowel obstruction, visible bowel inflammation or free intraperitoneal air. Vascular/Lymphatic: Calcified plaque throughout the abdominal aorta without evidence of aneurysm. No enlarged lymph nodes identified in the abdomen. Other: No ascites or focal fluid collections. No hernias identified. Body wall edema present. Musculoskeletal: No acute or significant osseous findings.  IMPRESSION: 1. Significantly diminished right pleural effusion after recent thoracentesis. 2. Small to moderate left pleural effusion. 3. The stomach has a relative horizontal position and is almost completely located posterior to the transverse colon and splenic flexure. The patient is not a candidate for percutaneous gastrostomy due to high risk of colonic injury. Electronically Signed   By: Aletta Edouard M.D.   On: 09/07/2019 16:32    Assessment:  Albert [name] is a 86 y.o. male with chronic kidney disease, and hypertension who was admitted through the emergency room with fever and hypotension.  He has a history of upper GI bleeding secondary to esophageal and duodenal ulcers.    He completed 5 days of Unasyn for RLL aspiration pneumonia.  He is on oral vancomycin for C difficile colitis.  He has a history of smudge cells seen on peripheral smear.  CBC on 09/01/2019 reveals a hematocrit of 29.2, hemoglobin 8.9, MCV 93.0, platelets 216,000, WBC 8000.  He has had chronic anemia since 11/2018.  WBC has ranged between 5,000 - 25,000 without trend.  Differential has been predominantly neutrophils.  Platelet count has ranged between 99,000 - 239,000 without trend.  Ferritin was 176 and iron saturation of 9% and a TIBC of 116 on 08/25/2019.  B12 was 300 on 08/26/2019.  Peripheral smear revealed normocytic anemia.  Morphology of RBCs, WBCs and platelets were within normal limits.  There were no evidence of circulating blasts, schistocytes or smudge cells. Flow cytometry on 09/05/2019 revealed no immunophenotypic abnormality.  Chest CT with contrast on 09/02/2019 revealed moderate bilateral pleural effusions and compressive atelectasis of the primary lobes.  There was a small pulmonary embolus in the left upper lobe pulmonary artery.  Bilateral lower extremity duplex revealed no evidence of a DVT.  Patient is s/p IVC filter on 09/04/2019.  Colonoscopy on 08/05/2019 revealed a poor colon prep.  There  was diverticulosis in the sigmoid colon.  There was blood and stool in the  rectum and in the sigmoid colon.   EGD on 08/05/2019 revealed an esophageal ulcer with no stigmata of recent bleeding.  There was LA Grade A reflux esophagitis with no bleeding and a 2 cm hiatal hernia.  There was erythematous mucosa in the antrum and non-bleeding duodenal ulcers with no stigmata of bleeding.  Ultrasound guided thoracentesis on 09/05/2019 revealed 1.1 liters of fluid.  Cytology revealed no malignancy.  There were reactive mesothelial cells and mixed inflammatory cells.  Symptomatically,  he has pain associated with his new G-tube placed today.  Plan:   1.   Smudge cells, resolved             Peripheral smear revealed no smudge cells.             Differential on 08/24/2019 revealed predominantely leukocytosis with an absolute lymphocyte count (ALC) of 900.             He denies any h/o recurrent infections or B symptoms.    Exam reveals no adenopathy or hepatosplenomegaly.             Flow cytometry on 09/05/2019 revealed no immunophenotypic abnormality. 2.  Normocytic anemia             Hematocrit 28.1.  Hemoglobin 9.2.  MCV 86.5 on 09/08/2019.  Retic 2.1%.  B12 300 (low normal).  Folate 8.3 (normal).  Sed rate 83 (high).  Creatinine 0.78 (CrCl 66.8 ml/min).  Patient has a history of GI bleeding. No current evidence of active bleeding.             Ferritin is 176 and likely falsely elevated (acute phase reactant).             Suspect anemia of chronic disease. 3.   Pulmonary embolism  PE is small.  Likely source was lower extremity.  Bilateral lower extremity duplex on 09/02/2019 revealed no DVT.  He is bedridden.  Anticoagulation is contraindicated secondary to recent GI bleeds.  Patient is s/p IVC filter on 09/04/2019. 4.   Pleural effusion  Ultrasound guided thoracentesis on 09/05/2019 revealed no malignancy.   5.   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  Patient is  s/p PEG tube placement today.  Patient s/p muscle biopsy today.   Lequita Asal, MD  09/08/2019, 8:40 PM

## 2019-09-08 NOTE — TOC Progression Note (Signed)
Transition of Care [facility]: Lambert Rhonda W) - Progression Note    Patient Details  Name: MYKING SAR MRN: [id number] Date of Birth: 1933/04/23  Transition of Care [facility]) CM/SW [place], RN Phone Number: 09/08/2019, 3:08 PM  Clinical Narrative:     Currently both facilities that previously offered a bed to Mr Willadsen are at full capacity, I have widened bed search and spoken with Darlene POA.  Ok to search towards [place].  Expected Discharge Plan: [place] Barriers to Discharge: Continued Medical Work up  Expected Discharge Plan and Services Expected Discharge Plan: [place] In-house Referral:  (Caregivers (family) that take turns in the home) Discharge Planning Services: CM Consult Post Acute Care Choice: [place]                                         Social Determinants of Health (SDOH) Interventions    Readmission Risk Interventions No flowsheet data found.

## 2019-09-09 ENCOUNTER — Encounter: Payer: Self-pay | Admitting: Surgery

## 2019-09-09 LAB — BASIC METABOLIC PANEL
Anion gap: 4 — ABNORMAL LOW (ref 5–15)
BUN: 28 mg/dL — ABNORMAL HIGH (ref 8–23)
CO2: 23 mmol/L (ref 22–32)
Calcium: 7.5 mg/dL — ABNORMAL LOW (ref 8.9–10.3)
Chloride: 108 mmol/L (ref 98–111)
Creatinine, Ser: 0.75 mg/dL (ref 0.61–1.24)
GFR calc Af Amer: >60 mL/min (ref >60)
GFR calc non Af Amer: >60 mL/min (ref >60)
Glucose, Bld: 124 mg/dL — ABNORMAL HIGH (ref 70–99)
Potassium: 4.6 mmol/L (ref 3.5–5.1)
Sodium: 135 mmol/L (ref 135–145)

## 2019-09-09 LAB — CBC
HCT: 28 % — ABNORMAL LOW (ref 39.0–52.0)
Hemoglobin: 9 g/dL — ABNORMAL LOW (ref 13.0–17.0)
MCH: 28.6 pg (ref 26.0–34.0)
MCHC: 32.1 g/dL (ref 30.0–36.0)
MCV: 88.9 fL (ref 80.0–100.0)
Platelets: 164 10*3/uL (ref 150–400)
RBC: 3.15 MIL/uL — ABNORMAL LOW (ref 4.22–5.81)
RDW: 18.5 % — ABNORMAL HIGH (ref 11.5–15.5)
WBC: 14.5 10*3/uL — ABNORMAL HIGH (ref 4.0–10.5)
nRBC: 0 % (ref 0.0–0.2)

## 2019-09-09 LAB — BODY FLUID CULTURE: Culture: NO GROWTH

## 2019-09-09 MED ORDER — OSMOLITE 1.5 CAL PO LIQD
1000.0000 mL | ORAL | Status: DC
  Administered 2019-09-09 – 2019-09-11 (×3): 1000 mL

## 2019-09-09 MED ORDER — SODIUM CHLORIDE 0.9 % IV SOLN
INTRAVENOUS | Status: DC | PRN
  Administered 2019-09-09 (×2): 10 mL via INTRAVENOUS

## 2019-09-09 NOTE — Progress Notes (Signed)
Pt osmolite 1.5 cal feed tube restarted. Will continue to monitor.

## 2019-09-09 NOTE — Progress Notes (Signed)
Nutrition Follow-Op Note   DOCUMENTATION CODES:   Severe malnutrition in context of chronic illness  INTERVENTION:   Resume Osmolite 1.5 @ 57m/hr + Pro-Source 466mdaily via tube  Free water flushes 10038m4 hours  Regimen provides 1840kcal/day, 86g/day protein and 1514m36my free water  NUTRITION DIAGNOSIS:   Severe Malnutrition related to chronic illness (dysphagia, recent COVID 19, CKD) as evidenced by severe fat depletion, severe muscle depletion.  GOAL:   Patient will meet greater than or equal to 90% of their needs  -previously met with tube feeds  MONITOR:   Labs, Weight trends, TF tolerance, Skin, I & O's  REASON FOR ASSESSMENT:   Consult Enteral/tube feeding initiation and management  ASSESSMENT:   86 y33. male  with past medical history of CKD, HTN, HLD, s/p COVID admission winter 2020, recent admission for GI bleeding dx with esophageal and duodenal ulcers, admitted on 08/24/2019 with sepsis r/t aspiration pneumonia and C. Difficile infection.   Pt s/p rigid upper esophagoscopy, upper esophageal sphincter dilation 8/25  Pt s/p right thigh muscle biopsy and 60F open gastrostomy tube placement 9/3  Pt s/p G-tube placement. Plan is to resume tube feeds at goal rate; pt tolerating well. Per chart, pt with ~10lb weight gain since admit; pt is noted to have edema.   Medications reviewed and include: protonix, lokelma, solu-medrol  Labs reviewed: BUN 28(H) P 3.5 wnl- 9/1 Wbc- 14.5(H), Hgb 9.0(L), Hct 28.0(L)  Diet Order:   Diet Order            Diet NPO time specified  Diet effective midnight                EDUCATION NEEDS:   No education needs have been identified at this time  Skin:  Skin Assessment: Reviewed RN Assessment (ecchymosis, skin tear buttocks)  Last BM:  9/2-type 7  Height:   Ht Readings from Last 1 Encounters:  08/24/19 '5\' 4"'  (1.626 m)    Weight:   Wt Readings from Last 1 Encounters:  09/09/19 82.9 kg    Ideal Body  Weight:  59 kg  BMI:  Body mass index is 31.36 kg/m.  Estimated Nutritional Needs:   Kcal:  1700-1900kcal/day  Protein:  85-95g/day  Fluid:  1.5-1.8L/ay  CaseKoleen Distance RD, LDN Please refer to [facility] RD and/or RD on-call/weekend/after hours pager

## 2019-09-09 NOTE — Progress Notes (Signed)
Reason for consult: Muscle weakness  Subjective: Patient states that he feels better and his strength appears to have improved compared to 3 days ago.  Underwent PEG placement and muscle biopsy yesterday   ROS: negative except above  Examination  Vital signs in last 24 hours: Temp:  [95.1 F (35.1 C)-97.8 F (36.6 C)] 97.5 F (36.4 C) (09/04 1200) Pulse Rate:  [74-92] 82 (09/04 1200) Resp:  [9-20] 18 (09/04 1200) BP: (107-143)/(51-76) 110/53 (09/04 1200) SpO2:  [93 %-98 %] 96 % (09/04 1200) Weight:  [82.9 kg] 82.9 kg (09/04 0518)  General: lying in bed CVS: pulse-normal rate and rhythm RS: breathing comfortably Extremities: normal   Neuro: Mental Status: Alert, oriented, thought content appropriate.  Speech fluent without evidence of aphasia.  Able to follow 3 step commands without difficulty. Cranial Nerves: II: visual fields grossly normal, pupils equal, round, reactive to light and accommodation III,IV, VI: ptosis not present, extraocular muscles extra-ocular motions intact bilaterally V,VII: smile symmetric, facial light touch sensation normal bilaterally VIII: hearing normal bilaterally IX,X: gag reflex present XI: trapezius strength/neck flexion strength normal bilaterally XII: tongue strength normal  Motor: Right : Upper extremity    Left:     Upper extremity 4/5 deltoid       4/5 deltoid 4/5 tricep      4/5 tricep 4/5 biceps      4/5 biceps  5/5wrist flexion     5/5 wrist flexion 5/5 wrist extension     5/5 wrist extension 5/5 hand grip      5/5 hand grip  Lower extremity     Lower extremity 3/5 hip flexor      3/5 hip flexor 4/5 hip adductors     4/5 hip adductors 4/5 hip abductors     4/5 hip abductors 4/5 quadricep      4/5 quadriceps  4+/5 plantar flexion       4+/5 plantar flexion 4/5 plantar extension     4/5 plantar extension Tone and bulk:normal tone throughout; no atrophy noted Sensory: Pinprick and light touch intact throughout, bilaterally Deep  Tendon Reflexes:  3+ b/l patellar reflexes Plantars: Right: downgoing   Left: downgoing Cerebellar: no gross ataxia   Basic Metabolic Panel: Recent Labs  Lab 09/03/19 0600 09/03/19 0841 09/04/19 0429 09/04/19 0429 09/05/19 0434 09/05/19 0434 09/05/19 0859 09/06/19 0452 09/06/19 0452 09/07/19 0627 09/08/19 0400 09/09/19 0443  NA   < >  --  141   < > 140  --   --  137  --  136 135 135  K   < >  --  5.1   < > 5.3*   < > 5.3* 5.4*  --  4.7 4.6 4.6  CL   < >  --  115*   < > 113*  --   --  110  --  108 108 108  CO2   < >  --  22   < > 23  --   --  21*  --  22 21* 23  GLUCOSE   < >  --  108*   < > 88  --   --  127*  --  159* 135* 124*  BUN   < >  --  20   < > 18  --   --  19  --  21 24* 28*  CREATININE   < >  --  0.72   < > 0.67  --   --  0.70  --  0.74 0.78 0.75  CALCIUM   < >  --  7.3*   < > 7.2*   < >  --  7.5*   < > 7.5* 7.6* 7.5*  MG  --  2.1 1.7  --  1.6*  --   --  1.9  --   --  1.9  --   PHOS  --   --  3.0  --  3.2  --   --  3.5  --   --   --   --    < > = values in this interval not displayed.    CBC: Recent Labs  Lab 09/05/19 0434 09/06/19 0452 09/07/19 0627 09/08/19 0400 09/09/19 0443  WBC 9.4 10.1 3.5* 2.8* 14.5*  NEUTROABS  --   --   --  2.1  --   HGB 9.4* 8.7* 9.1* 9.2* 9.0*  HCT 29.2* 28.0* 27.7* 28.1* 28.0*  MCV 88.2 91.5 87.1 86.5 88.9  PLT 177 157 114* 150 164     Coagulation Studies: Recent Labs    09/08/19 0400  LABPROT 13.1  INR 1.0    Imaging Reviewed:     ASSESSMENT AND PLAN This is an 86 year old gentleman who is previously fairly healthy and has had a complicated course recently with multiple hospitalizations, GI bleed, C. Diff infection and aspiration pneumonia.   His general examination is notable for diffuse atrophy, lower extremity edema, and a resolving rash on his trunk and arms with some possible nailbed changes on his toes. 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 Additionally he the aforementioned muscular atrophy as well as a pattern of proximal greater than distal weakness suggestive of a myopathy. Labs was positive ANA and SSB.   Malignancy work-up includes smudge cells present in peripheral smear, cytology from pleural tap was negative.  CT abdomen pelvis did not show any mass lesion. Anti HMG CR antibodies negative. Myositis antibody panel still pending. Muscle biopsy was sent yesterday  Patient has received 3 days of high-dose steroids with subjective improvement although muscle strength appears about the same.  His dysarthria has improved slightly.  May consider repeating swallow test in 2 to 3 days.   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

## 2019-09-09 NOTE — Op Note (Signed)
°  Procedure Date:  09/09/2019  Pre-operative Diagnosis:  Dysphagia, progressive muscular weakness  Post-operative Diagnosis:  Dysphagia, progressive muscular weakness  Procedure:  Right thigh muscle biopsy, open gastrostomy tube placement  Surgeon:  Melvyn Neth, MD  Assistant:  Nestor Lewandowsky, MD.  His assistance was necessary for appropriate exposure, gastrostomy placement.  Anesthesia:  General endotracheal  Estimated Blood Loss:  20 ml  Specimens:  Muscle biopsy specimen x 3  Complications:  None  Indications for Procedure:  This is a 86 y.o. male who presents with dysphagia and progressive muscular weakness.  Neurology has recommended muscle biopsy and G tube is needed for feeding.  The risks of bleeding, abscess or infection, injury to surrounding structures, and need for further procedures were all discussed with the patient and was willing to proceed.  Description of Procedure: The patient was correctly identified in the preoperative area and brought into the operating room.  The patient was placed supine with VTE prophylaxis in place.  Appropriate time-outs were performed.  Anesthesia was induced and the patient was intubated.  The abdomen and right thigh were prepped and draped in a sterile fashion.  A 5 cm incision was made in the right lower thigh at the level of the vastus lateralis.  Cautery was used to dissect down the subcutaneous tissue to the muscle fascia.  Scalpel was used to incise the fascia and Metzenbaum scissors to extend the incision.  Then, three muscle biopsies were obtained of about 2 cm each.  One was clamped stretched, and two were free.  They were all placed in moist gauze and sent immediately to pathology.  The fascia was then closed using 2-0 Vicryl.  10 ml of Exparel solution combined with 0.5% bupivacaine with epi was infiltrated onto the fascia, subcutaneous tissue, and skin.  Wound was closed in layers using 3-0 Vicryl and 4-0 Monocryl.    Then we  turned our attention to the abdomen.  An epigastric  midline incision was made and electrocautery was used to dissect down the subcutaneous tissue to the fascia.  The fascia was incised and extended superiorly and inferiorly.  The stomach was identified and the mid body was brought out to the midline incision.  Two concentric purse string sutures were made using 3-0 Silk and a gastrotomy was made in the center.  An 18 Fr. Gastrostomy tube was brought through the skin in the left upper quadrant and into the gastrotomy.  The balloon was inflated to 20 ml of air.  The purse sutures were tightened and the tube was secured.  Then the stomach wall was brought up to the anterior abdominal wall using 2-0 Silk suture x 4. 40 ml of Exparel solution combined with 0.5% bupivacaine with epi was infiltrated onto the peritoneum, fascia, and subcutaneous tissue. The fascia was then closed using #1 PDS suture x 2.  The wound was irrigated and then closed in layers using 3-0 Vicryl and 4-0 Monocryl.    Both incisions were cleaned and sealed with DermaBond.  The gastrostomy tube was secured to the skin by pushing the silicone disc flush against the skin and securing using 3-0 Nylon.  It was dressed with 4x4 gauze and tape.  The patient was emerged from anesthesia and extubated and brought to the recovery room for further management.  The patient tolerated the procedure well and all counts were correct at the end of the case.   Melvyn Neth, MD

## 2019-09-09 NOTE — Plan of Care (Signed)
  Problem: Education: Goal: Knowledge of General Education information will improve Description: Including pain rating scale, medication(s)/side effects and non-pharmacologic comfort measures Outcome: Progressing   Problem: Elimination: Goal: Will not experience complications related to bowel motility Outcome: Progressing   

## 2019-09-09 NOTE — Progress Notes (Signed)
[facility] Day(s): 15.   Post op day(s): 1 Day Post-Op.   Interval History: Patient seen and examined, no acute events or new complaints overnight.    Vital signs in last 24 hours: [min-max] current  Temp:  [95.1 F (35.1 C)-97.8 F (36.6 C)] 97.5 F (36.4 C) (09/04 1200) Pulse Rate:  [74-92] 82 (09/04 1200) Resp:  [9-20] 18 (09/04 1200) BP: (107-143)/(51-76) 110/53 (09/04 1200) SpO2:  [93 %-98 %] 96 % (09/04 1200) Weight:  [82.9 kg] 82.9 kg (09/04 0518)     Height: 5\' 4"  (162.6 cm) Weight: 82.9 kg BMI (Calculated): 31.34   Intake/Output last 2 shifts:  09/03 0701 - 09/04 0700 In: 1185.1 [I.V.:1131.1; IV Piggyback:54] Out: 750 [Urine:750]   Physical Exam:  Constitutional: alert, cooperative and no distress  Respiratory: breathing non-labored at rest  Cardiovascular: regular rate and sinus rhythm  Gastrointestinal: G-tube in place with midline incision clean, dry and intact.  Soft, non-tender, and non-distended.  G-tube to drain with minimal slightly bile tinged output.  Right anterior thigh incision clean, dry and intact.  No appreciable ecchymosis noted.   Labs:  CBC Latest Ref Rng & Units 09/09/2019 09/08/2019 09/07/2019  WBC 4.0 - 10.5 K/uL 14.5(H) 2.8(L) 3.5(L)  Hemoglobin 13.0 - 17.0 g/dL 9.0(L) 9.2(L) 9.1(L)  Hematocrit 39 - 52 % 28.0(L) 28.1(L) 27.7(L)  Platelets 150 - 400 K/uL 164 150 114(L)   CMP Latest Ref Rng & Units 09/09/2019 09/08/2019 09/07/2019  Glucose 70 - 99 mg/dL 124(H) 135(H) 159(H)  BUN 8 - 23 mg/dL 28(H) 24(H) 21  Creatinine 0.61 - 1.24 mg/dL 0.75 0.78 0.74  Sodium 135 - 145 mmol/L 135 135 136  Potassium 3.5 - 5.1 mmol/L 4.6 4.6 4.7  Chloride 98 - 111 mmol/L 108 108 108  CO2 22 - 32 mmol/L 23 21(L) 22  Calcium 8.9 - 10.3 mg/dL 7.5(L) 7.6(L) 7.5(L)  Total Protein 6.5 - 8.1 g/dL - - -  Total Bilirubin 0.3 - 1.2 mg/dL - - -  Alkaline Phos 38 - 126 U/L - - -  AST 15 - 41 U/L - - -  ALT 0 - 44 U/L - - -      Imaging studies: No new pertinent imaging studies   Assessment/Plan:  86 y.o. male with  1 Day Post-Op s/p open gastrostomy and right anterior thigh muscle biopsy, complicated by pertinent comorbidities including:  Patient Active Problem List   Diagnosis Date Noted  . Autoimmune disease ([place])   . Palliative care by specialist   . Advanced care planning/counseling discussion   . Goals of care, counseling/discussion   . Hypomagnesemia   . Hypokalemia   . Aspiration pneumonia of right lower lobe due to gastric secretions ([place])   . Dysphagia   . C. difficile colitis   . Ulcer of esophagus without bleeding   . Lobar pneumonia ([place])   . Wide-complex tachycardia ([place])   . Sepsis ([place]) 08/24/2019  . Lactic acidosis 08/24/2019  . Anemia 08/24/2019  . Elevated brain natriuretic peptide (BNP) level 08/24/2019  . Protein-calorie malnutrition, severe 08/12/2019  . GIB (gastrointestinal bleeding) 08/10/2019  . GI bleed 08/09/2019  . Acute GI bleeding 08/04/2019  . Symptomatic anemia 08/04/2019  . Coagulopathy ([place]) 08/04/2019  . Metabolic acidosis 22/29/7989  . SIRS (systemic inflammatory response syndrome) ([place]) 08/04/2019  . UGI bleed 08/04/2019  . Dehydration 11/24/2018  . CKD (chronic kidney disease), stage III 11/24/2018  . Generalized weakness 11/22/2018  . Acute renal failure  superimposed on stage 3a chronic kidney disease ([place])   . Elevated troponin   . Hyperlipidemia 06/15/2014  . Benign essential hypertension 06/15/2013    -May initiate tube feeds as desired, advised gradual progression to confirm patient's tolerance.  -- Ronny Bacon, M.D., [facility] 09/09/2019

## 2019-09-09 NOTE — Progress Notes (Signed)
[facility] PROGRESS NOTE    Albert [name]  VOZ:[id number] DOB: April 09, 1933 DOA: 08/24/2019 PCP: [name], MD      Brief Narrative:  Albert [name] y.o.male with PMH of CKD, HTN, recent GI bleed presents from SNF for fever and hypotension. He is admitted for sepsis secondary to possible aspiration pneumonia and C. difficile colitis.  He completed antibiotics for Aspiration pneumonia. He is on PO vancomycin through NG tube.   Given dysphagia he underwent thorough workup,  Neurology thinks it secondary to autoimmune myopathies but there is concern about myositis or dermatomyositis,  Neurology recommended Muscle biopsy  And close rheumatology Out patient follow-up.  Patient underwent  upper esophagoscopy with  esophageal sphincter dilatation. Palliative care consulted to discuss goals of care,  Patient want full scope of care.  Patient is at very high risk for aspiration,  Patient had a NG tube placed by IR.  He was found to have small PE on CT chest,  Venous duplex negative for DVT. Patient has hx. Of GI bleeding ,  Vascular Surgery consulted, he underwent IVC filter placement.  Patient underwent thoracocentesis for pleural effusion , cytology for diagnosis.    PEG placed 9/3.    Assessment & Plan:  Sepsis due to aspiration pneumonia and C. difficile colitis Completed 13 days oral vancomyicn and 5 days Unasyn.  No further fever.  Note leukocytosis today  -Trend CBC and fever curve   Dysphagia PEG tube placed yesterday -Initiate tube feeds  Weakness -Continue steroids -Prompt rheumatology follow-up after discharge  -Continue metoprolol and zirconium  Hypertension -Hold HCTZ -Hold aspirin and atorvastatin  Anemia  Stable    Disposition: Status is: Inpatient  Remains inpatient appropriate because:Inpatient level of care appropriate due to severity of illness   Dispo: The patient is from: Home              Anticipated d/c is to: SNF               Anticipated d/c date is: 1 day              Patient currently is not medically stable to d/c.              MDM: The below labs and imaging reports were reviewed and summarized above.  Medication management as above.  DVT prophylaxis:   Code Status: FULL Family Communication:     Consultants:       Procedures:     Antimicrobials:      Culture data:              Subjective: Patient said no vomiting, no abdominal pain, no fever, no confusion.  Objective: Vitals:   09/09/19 0800 09/09/19 1000 09/09/19 1100 09/09/19 1200  BP: (!) 110/52  118/72 (!) 110/53  Pulse: 88   82  Resp:  18 20 18   Temp: (!) 97.1 F (36.2 C)  (!) 97.3 F (36.3 C) (!) 97.5 F (36.4 C)  TempSrc: Oral  Oral Oral  SpO2: 97%  96% 96%  Weight:      Height:        Intake/Output Summary (Last 24 hours) at 09/09/2019 1937 Last data filed at 09/09/2019 0435 Gross per 24 hour  Intake 385.1 ml  Output --  Net 385.1 ml   Filed Weights   09/05/19 0500 09/08/19 0531 09/09/19 0518  Weight: 74.8 kg 75 kg 82.9 kg    Examination: General appearance:  adult male, alert and in no  acute distress.   HEENT: Anicteric, conjunctiva pink, lids and lashes normal. No nasal deformity, discharge, epistaxis.  Lips moist.   Skin: Warm and dry.  No jaundice.  Cardiac: RRR, nl S1-S2, no murmurs appreciated.  Capillary refill is brisk.  JVP normal  No LE edema.  Radial  pulses 2+ and symmetric. Respiratory: Normal respiratory rate and rhythm.  CTAB without rales or wheezes. Abdomen: Abdomen soft.  No TTP guarding. No ascites, distension, hepatosplenomegaly.   MSK: No deformities or effusions. Neuro: Awake and alert.  EOMI, moves all extremities. Speech fluent.    Psych: Sensorium intact and responding to questions, attention normal. Affect .  Judgment and insight appear normal.    Data Reviewed: I have personally reviewed following labs and imaging studies:  CBC: Recent Labs  Lab  09/29/2019 0434 09/06/19 0452 09/07/19 0627 09/08/19 0400 09/09/19 0443  WBC 9.4 10.1 3.5* 2.8* 14.5*  NEUTROABS  --   --   --  2.1  --   HGB 9.4* 8.7* 9.1* 9.2* 9.0*  HCT 29.2* 28.0* 27.7* 28.1* 28.0*  MCV 88.2 91.5 87.1 86.5 88.9  PLT 177 157 114* 150 017   Basic Metabolic Panel: Recent Labs  Lab 09/03/19 0600 09/03/19 0841 09/04/19 0429 09/04/19 0429 2019/09/29 0434 September 29, 2019 0434 29-Sep-2019 0859 09/06/19 0452 09/07/19 0627 09/08/19 0400 09/09/19 0443  NA   < >  --  141   < > 140  --   --  137 136 135 135  K   < >  --  5.1   < > 5.3*   < > 5.3* 5.4* 4.7 4.6 4.6  CL   < >  --  115*   < > 113*  --   --  110 108 108 108  CO2   < >  --  22   < > 23  --   --  21* 22 21* 23  GLUCOSE   < >  --  108*   < > 88  --   --  127* 159* 135* 124*  BUN   < >  --  20   < > 18  --   --  19 21 24* 28*  CREATININE   < >  --  0.72   < > 0.67  --   --  0.70 0.74 0.78 0.75  CALCIUM   < >  --  7.3*   < > 7.2*  --   --  7.5* 7.5* 7.6* 7.5*  MG  --  2.1 1.7  --  1.6*  --   --  1.9  --  1.9  --   PHOS  --   --  3.0  --  3.2  --   --  3.5  --   --   --    < > = values in this interval not displayed.   GFR: Estimated Creatinine Clearance: 66.8 mL/min (by C-G formula based on SCr of 0.75 mg/dL). Liver Function Tests: No results for input(s): AST, ALT, ALKPHOS, BILITOT, PROT, ALBUMIN in the last 168 hours. No results for input(s): LIPASE, AMYLASE in the last 168 hours. No results for input(s): AMMONIA in the last 168 hours. Coagulation Profile: Recent Labs  Lab 09/08/19 0400  INR 1.0   Cardiac Enzymes: No results for input(s): CKTOTAL, CKMB, CKMBINDEX, TROPONINI in the last 168 hours. BNP (last 3 results) No results for input(s): PROBNP in the last 8760 hours. HbA1C: No results for input(s): HGBA1C in the last  72 hours. CBG: No results for input(s): GLUCAP in the last 168 hours. Lipid Profile: No results for input(s): CHOL, HDL, LDLCALC, TRIG, CHOLHDL, LDLDIRECT in the last 72 hours. Thyroid  Function Tests: No results for input(s): TSH, T4TOTAL, FREET4, T3FREE, THYROIDAB in the last 72 hours. Anemia Panel: No results for input(s): VITAMINB12, FOLATE, FERRITIN, TIBC, IRON, RETICCTPCT in the last 72 hours. Urine analysis:    Component Value Date/Time   COLORURINE YELLOW (A) 08/24/2019 2037   APPEARANCEUR HAZY (A) 08/24/2019 2037   LABSPEC 1.015 08/24/2019 2037   PHURINE 5.0 08/24/2019 2037   GLUCOSEU 50 (A) 08/24/2019 2037   HGBUR NEGATIVE 08/24/2019 2037   [place] NEGATIVE 08/24/2019 2037   KETONESUR 5 (A) 08/24/2019 2037   PROTEINUR NEGATIVE 08/24/2019 2037   NITRITE NEGATIVE 08/24/2019 2037   LEUKOCYTESUR NEGATIVE 08/24/2019 2037   Sepsis Labs: @LABRCNTIP (procalcitonin:4,lacticacidven:4)  ) Recent Results (from the past 240 hour(s))  Body fluid culture     Status: None   Collection Time: 09/05/19  3:15 PM   Specimen: PATH Cytology Pleural fluid  Result Value Ref Range Status   Specimen Description   Final    PLEURAL Performed at [facility], [address] [place], [place] [address]    Special Requests   Final    NONE Performed at [facility], [address] [place], [place] [address]    Gram Stain   Final    FEW WBC PRESENT, PREDOMINANTLY MONONUCLEAR NO ORGANISMS SEEN    Culture   Final    NO GROWTH 3 DAYS Performed at [facility], [employer] [address], [place], [place] [address]    Report Status 09/09/2019 FINAL  Final  Acid Fast Smear (AFB)     Status: None   Collection Time: 09/05/19  3:15 PM   Specimen: PATH Cytology Pleural fluid  Result Value Ref Range Status   AFB Specimen Processing Concentration  Final   Acid Fast Smear Negative  Final    Comment: (NOTE) Performed At: [facility] [address] [place], [place] [id number] Rush Farmer MD VW:[id number]    Source (AFB) PLEURAL  Final    Comment: Performed at [facility], [address] [place],  [address]         Radiology  Studies: No results found.      Scheduled Meds: . feeding supplement (PROSource TF)  45 mL Per Tube Daily  . free water  100 mL Per Tube Q4H  . metoprolol tartrate  2.5 mg Intravenous Q12H  . pantoprazole (PROTONIX) IV  40 mg Intravenous Q24H  . sodium zirconium cyclosilicate  10 g Per Tube Daily   Continuous Infusions: . sodium chloride Stopped (09/09/19 0204)  . sodium chloride 10 mL (09/09/19 0033)  . dextrose 5 % and 0.45% NaCl 50 mL/hr at 09/08/19 2028  . feeding supplement (OSMOLITE 1.5 CAL) 1,000 mL (09/06/19 1417)  . methylPREDNISolone (SOLU-MEDROL) injection 500 mg (09/09/19 1130)     LOS: 15 days    Time spent: 25 minutes    Edwin Dada, MD [facility] 09/09/2019, 7:37 PM     Please page though [place] or Epic secure chat:  For [employer], Adult nurse

## 2019-09-09 NOTE — Progress Notes (Signed)
NIF -25 VC 1.4L

## 2019-09-09 NOTE — TOC Progression Note (Signed)
Transition of Care [facility]) - Progression Note    Patient Details  Name: Albert [name] MRN: [id number] Date of Birth: 06/26/1933  [facility] CM/SW Contact  Zigmund Daniel Dorian Pod, RN Phone Number:[phone] 09/09/2019, 6:42 PM  Clinical Narrative:     Damaris Schooner with caregiver Joyce Gross) concerning acceptable beds. PT recommends SNF and caregiver was receptive to pt going to 1st choice-[facility], 2nd-[facility], [facility] or [facility]. Caregiver indicates pt informed her that pt will have another swallow test tomorrow.   Will continue communication with the provider on pt's disposition for discharge tomorrow. Pt with peg tube and remains limited with ambulation.   TOC team will continue to follow.    Expected Discharge Plan: [place] Barriers to Discharge: Continued Medical Work up  Expected Discharge Plan and Services Expected Discharge Plan: [place] In-house Referral:  (Caregivers (family) that take turns in the home) Discharge Planning Services: CM Consult Post Acute Care Choice: [place]                                         Social Determinants of Health (SDOH) Interventions    Readmission Risk Interventions No flowsheet data found.

## 2019-09-10 LAB — COMPREHENSIVE METABOLIC PANEL
ALT: 11 U/L (ref 0–44)
AST: 27 U/L (ref 15–41)
Albumin: 1.5 g/dL — ABNORMAL LOW (ref 3.5–5.0)
Alkaline Phosphatase: 58 U/L (ref 38–126)
Anion gap: 6 (ref 5–15)
BUN: 33 mg/dL — ABNORMAL HIGH (ref 8–23)
CO2: 21 mmol/L — ABNORMAL LOW (ref 22–32)
Calcium: 7.3 mg/dL — ABNORMAL LOW (ref 8.9–10.3)
Chloride: 109 mmol/L (ref 98–111)
Creatinine, Ser: 0.91 mg/dL (ref 0.61–1.24)
GFR calc Af Amer: >60 mL/min (ref >60)
GFR calc non Af Amer: >60 mL/min (ref >60)
Glucose, Bld: 168 mg/dL — ABNORMAL HIGH (ref 70–99)
Potassium: 4.6 mmol/L (ref 3.5–5.1)
Sodium: 136 mmol/L (ref 135–145)
Total Bilirubin: 0.9 mg/dL (ref 0.3–1.2)
Total Protein: 4.3 g/dL — ABNORMAL LOW (ref 6.5–8.1)

## 2019-09-10 LAB — CBC
HCT: 25.4 % — ABNORMAL LOW (ref 39.0–52.0)
Hemoglobin: 8 g/dL — ABNORMAL LOW (ref 13.0–17.0)
MCH: 28.1 pg (ref 26.0–34.0)
MCHC: 31.5 g/dL (ref 30.0–36.0)
MCV: 89.1 fL (ref 80.0–100.0)
Platelets: 128 10*3/uL — ABNORMAL LOW (ref 150–400)
RBC: 2.85 MIL/uL — ABNORMAL LOW (ref 4.22–5.81)
RDW: 18.4 % — ABNORMAL HIGH (ref 11.5–15.5)
WBC: 10 10*3/uL (ref 4.0–10.5)
nRBC: 0 % (ref 0.0–0.2)

## 2019-09-10 MED ORDER — FUROSEMIDE 10 MG/ML IJ SOLN
40.0000 mg | Freq: Two times a day (BID) | INTRAMUSCULAR | Status: DC
  Administered 2019-09-10 (×2): 40 mg via INTRAVENOUS
  Filled 2019-09-10 (×3): qty 4

## 2019-09-10 MED ORDER — METOPROLOL TARTRATE 25 MG PO TABS
12.5000 mg | ORAL_TABLET | Freq: Two times a day (BID) | ORAL | Status: DC
  Administered 2019-09-10 – 2019-09-11 (×2): 12.5 mg via ORAL
  Filled 2019-09-10 (×3): qty 1

## 2019-09-10 MED ORDER — PANTOPRAZOLE SODIUM 20 MG PO TBEC
20.0000 mg | DELAYED_RELEASE_TABLET | Freq: Two times a day (BID) | ORAL | Status: DC
  Administered 2019-09-10 – 2019-09-12 (×4): 20 mg via ORAL
  Filled 2019-09-10 (×5): qty 1

## 2019-09-10 MED ORDER — MORPHINE SULFATE (PF) 2 MG/ML IV SOLN
2.0000 mg | INTRAVENOUS | Status: DC | PRN
  Administered 2019-09-10 – 2019-09-11 (×2): 2 mg via INTRAVENOUS
  Filled 2019-09-10 (×2): qty 1

## 2019-09-10 NOTE — Plan of Care (Signed)
  Problem: Clinical Measurements: Goal: Ability to maintain clinical measurements within normal limits will improve Outcome: Progressing Goal: Will remain free from infection Outcome: Progressing   Problem: Elimination: Goal: Will not experience complications related to bowel motility Outcome: Progressing   Problem: Pain Managment: Goal: General experience of comfort will improve Outcome: Progressing   Problem: Safety: Goal: Ability to remain free from injury will improve Outcome: Progressing

## 2019-09-10 NOTE — Progress Notes (Signed)
[facility] PROGRESS NOTE    Albert [name]  YKD:[id number] DOB: Aug 26, 1933 DOA: 08/24/2019 PCP: [name], MD      Brief Narrative:  Albert [name] y.o.male with PMH of CKD, HTN, recent GI bleed presents from SNF for fever and hypotension. He is admitted for sepsis secondary to possible aspiration pneumonia and C. difficile colitis.  He completed antibiotics for Aspiration pneumonia. He is on PO vancomycin through NG tube.   Given dysphagia he underwent thorough workup,  Neurology thinks it secondary to autoimmune myopathies but there is concern about myositis or dermatomyositis,  Neurology recommended Muscle biopsy  And close rheumatology Out patient follow-up.  Patient underwent  upper esophagoscopy with  esophageal sphincter dilatation. Palliative care consulted to discuss goals of care,  Patient want full scope of care.  Patient is at very high risk for aspiration,  Patient had a NG tube placed by IR.  He was found to have small PE on CT chest,  Venous duplex negative for DVT. Patient has hx. Of GI bleeding ,  Vascular Surgery consulted, he underwent IVC filter placement.  Patient underwent thoracocentesis for pleural effusion , cytology for diagnosis.    PEG placed 9/3.    Assessment & Plan:  Sepsis due to aspiration pneumonia and C. difficile colitis Completed 13 days oral vancomyicn and 5 days Unasyn.  No further fever.  -Trend CBC and fever curve   Fluid overload No history of CHF, likely this is from steroids and IV fluids. -Start Lasix -Daily BMP -Stop,  Dysphagia PEG tube placed yesterday -Continue tube feeds  Weakness Neurology suspected this is autoimmune in nature -Continue steroids, day 5 of high-dose steroids, will transition tomorrow to 20 daily for maintenance until Rheumatology follow up -Prompt rheumatology and neurology follow-up after discharge -Follow muscle biopsy    Hypertension -Hold HCTZ -Hold aspirin and  atorvastatin -Continue metoprolol  Anemia  Stable  CKD stage IIIa Stable relative to baseline  Esophageal ulcer, duodenal ulcer, recent GI bleed -Continue pantoprazole  Severe protein calorie malnutrition As evidenced by severe loss of subcutaneous muscle mass and fat, chronic illness, poor p.o. intake.      Disposition: Status is: Inpatient  Remains inpatient appropriate because:Inpatient level of care appropriate due to severity of illness   Dispo: The patient is from: Home              Anticipated d/c is to: SNF              Anticipated d/c date is: 1 day              Patient currently is not medically stable to d/c.              MDM: The below labs and imaging reports were reviewed and summarized above.  Medication management as above.  DVT prophylaxis:   Code Status: FULL Family Communication: Cousin by phone    Consultants:       Procedures:     Antimicrobials:      Culture data:              Subjective: No new vomiting, abdominal pain, fever, confusion    Objective: Vitals:   09/10/19 0405 09/10/19 0427 09/10/19 0730 09/10/19 1218  BP: 114/63 (!) 120/47 (!) 119/50 (!) 108/51  Pulse: 81 83 65   Resp: 18  16 16   Temp: 97.6 F (36.4 C) 97.6 F (36.4 C) (!) 97.3 F (36.3 C) 97.7 F (36.5 C)  TempSrc:  Oral Oral Oral Oral  SpO2: 95% 97% 96% 97%  Weight:      Height:        Intake/Output Summary (Last 24 hours) at 09/10/2019 1646 Last data filed at 09/10/2019 1614 Gross per 24 hour  Intake 798.23 ml  Output 2475 ml  Net -1676.77 ml   Filed Weights   09/05/19 0500 09/08/19 0531 09/09/19 0518  Weight: 74.8 kg 75 kg 82.9 kg    Examination: General appearance: Chronically ill-appearing adult male, sleeping, arouses     HEENT:    Skin: Diffuse rash, resolving Cardiac: RRR, severe diffuse edema, no murmurs. Respiratory: Lung sounds diminished, respiratory effort weak, normal respiratory rate, no rales or wheezing  appreciated. Abdomen: No tenderness palpation or guarding, abdomen soft, no distention or ascites. MSK: Diffuse loss of subcutaneous muscle mass and fat. Neuro: Awake and alert, severe diffuse proximal weakness.  Speech fluent.  Strength symmetric. Psych: Attention diminished, affect blunted, judgment insight appear normal.      Data Reviewed: I have personally reviewed following labs and imaging studies:  CBC: Recent Labs  Lab 09/06/19 0452 09/07/19 0627 09/08/19 0400 09/09/19 0443 09/10/19 1106  WBC 10.1 3.5* 2.8* 14.5* 10.0  NEUTROABS  --   --  2.1  --   --   HGB 8.7* 9.1* 9.2* 9.0* 8.0*  HCT 28.0* 27.7* 28.1* 28.0* 25.4*  MCV 91.5 87.1 86.5 88.9 89.1  PLT 157 114* 150 164 419*   Basic Metabolic Panel: Recent Labs  Lab 09/04/19 0429 09/04/19 0429 09/05/19 0434 09/05/19 0859 09/06/19 0452 09/07/19 0627 09/08/19 0400 09/09/19 0443 09/10/19 0331  NA 141   < > 140  --  137 136 135 135 136  K 5.1   < > 5.3*   < > 5.4* 4.7 4.6 4.6 4.6  CL 115*   < > 113*  --  110 108 108 108 109  CO2 22   < > 23  --  21* 22 21* 23 21*  GLUCOSE 108*   < > 88  --  127* 159* 135* 124* 168*  BUN 20   < > 18  --  19 21 24* 28* 33*  CREATININE 0.72   < > 0.67  --  0.70 0.74 0.78 0.75 0.91  CALCIUM 7.3*   < > 7.2*  --  7.5* 7.5* 7.6* 7.5* 7.3*  MG 1.7  --  1.6*  --  1.9  --  1.9  --   --   PHOS 3.0  --  3.2  --  3.5  --   --   --   --    < > = values in this interval not displayed.   GFR: Estimated Creatinine Clearance: 58.7 mL/min (by C-G formula based on SCr of 0.91 mg/dL). Liver Function Tests: Recent Labs  Lab 09/10/19 0331  AST 27  ALT 11  ALKPHOS 58  BILITOT 0.9  PROT 4.3*  ALBUMIN 1.5*   No results for input(s): LIPASE, AMYLASE in the last 168 hours. No results for input(s): AMMONIA in the last 168 hours. Coagulation Profile: Recent Labs  Lab 09/08/19 0400  INR 1.0   Cardiac Enzymes: No results for input(s): CKTOTAL, CKMB, CKMBINDEX, TROPONINI in the last 168  hours. BNP (last 3 results) No results for input(s): PROBNP in the last 8760 hours. HbA1C: No results for input(s): HGBA1C in the last 72 hours. CBG: No results for input(s): GLUCAP in the last 168 hours. Lipid Profile: No results for input(s): CHOL, HDL, LDLCALC,  TRIG, CHOLHDL, LDLDIRECT in the last 72 hours. Thyroid Function Tests: No results for input(s): TSH, T4TOTAL, FREET4, T3FREE, THYROIDAB in the last 72 hours. Anemia Panel: No results for input(s): VITAMINB12, FOLATE, FERRITIN, TIBC, IRON, RETICCTPCT in the last 72 hours. Urine analysis:    Component Value Date/Time   COLORURINE YELLOW (A) 08/24/2019 2037   APPEARANCEUR HAZY (A) 08/24/2019 2037   LABSPEC 1.015 08/24/2019 2037   PHURINE 5.0 08/24/2019 2037   GLUCOSEU 50 (A) 08/24/2019 2037   HGBUR NEGATIVE 08/24/2019 2037   [place] NEGATIVE 08/24/2019 2037   KETONESUR 5 (A) 08/24/2019 2037   PROTEINUR NEGATIVE 08/24/2019 2037   NITRITE NEGATIVE 08/24/2019 2037   LEUKOCYTESUR NEGATIVE 08/24/2019 2037   Sepsis Labs: @LABRCNTIP (procalcitonin:4,lacticacidven:4)  ) Recent Results (from the past 240 hour(s))  Body fluid culture     Status: None   Collection Time: 09/05/19  3:15 PM   Specimen: PATH Cytology Pleural fluid  Result Value Ref Range Status   Specimen Description   Final    PLEURAL Performed at [facility], [address], [place], [place] [address]    Special Requests   Final    NONE Performed at [facility], [address] [place], [place] [address]    Gram Stain   Final    FEW WBC PRESENT, PREDOMINANTLY MONONUCLEAR NO ORGANISMS SEEN    Culture   Final    NO GROWTH 3 DAYS Performed at [facility], [place] [address] [place], [place] [address]    Report Status 09/09/2019 FINAL  Final  Acid Fast Smear (AFB)     Status: None   Collection Time: 09/05/19  3:15 PM   Specimen: PATH Cytology Pleural fluid  Result Value Ref Range Status   AFB Specimen Processing  Concentration  Final   Acid Fast Smear Negative  Final    Comment: (NOTE) Performed At: [facility] [address] [place], [place] [id number] Rush Farmer MD KJ:[id number]    Source (AFB) PLEURAL  Final    Comment: Performed at [facility], [address], [place],  [address]         Radiology Studies: No results found.      Scheduled Meds: . feeding supplement (PROSource TF)  45 mL Per Tube Daily  . free water  100 mL Per Tube Q4H  . furosemide  40 mg Intravenous BID  . metoprolol tartrate  12.5 mg Oral BID  . pantoprazole  20 mg Oral BID AC   Continuous Infusions: . feeding supplement (OSMOLITE 1.5 CAL) 1,000 mL (09/09/19 2318)  . methylPREDNISolone (SOLU-MEDROL) injection 500 mg (09/10/19 1100)     LOS: 16 days    Time spent: 25 minutes    Edwin Dada, MD [facility] 09/10/2019, 4:46 PM     Please page though [place] or Epic secure chat:  For [employer], Adult nurse

## 2019-09-10 NOTE — Progress Notes (Signed)
[place] Hospital Day(s): [place] op day(s): 2 Days Post-Op.   Interval History: Patient seen and examined, no acute events or new complaints overnight.  Tolerating tube feeding so far.   Vital signs in last 24 hours: [min-max] current  Temp:  [97.3 F (36.3 C)-98.1 F (36.7 C)] 97.7 F (36.5 C) (09/05 1218) Pulse Rate:  [65-90] 65 (09/05 0730) Resp:  [16-18] 16 (09/05 1218) BP: (108-123)/(47-63) 108/51 (09/05 1218) SpO2:  [95 %-97 %] 97 % (09/05 1218)     Height: 5\' 4"  (162.6 cm) Weight: 82.9 kg BMI (Calculated): 31.34   Intake/Output last 2 shifts:  09/04 0701 - 09/05 0700 In: 798.2 [I.V.:419.4; NG/GT:330; IV Piggyback:48.9] Out: 026 [Urine:675; Drains:200]   Physical Exam:  Constitutional: alert, cooperative and no distress  Respiratory: breathing non-labored at rest  Cardiovascular: regular rate and sinus rhythm  Gastrointestinal: G-tube in place with midline incision clean, dry and intact.  Soft, non-tender, and non-distended.  G-tube to drain with minimal slightly bile tinged output.  Right anterior thigh incision clean, dry and intact.  No appreciable ecchymosis noted.   Labs:  CBC Latest Ref Rng & Units 09/10/2019 09/09/2019 09/08/2019  WBC 4.0 - 10.5 K/uL 10.0 14.5(H) 2.8(L)  Hemoglobin 13.0 - 17.0 g/dL 8.0(L) 9.0(L) 9.2(L)  Hematocrit 39 - 52 % 25.4(L) 28.0(L) 28.1(L)  Platelets 150 - 400 K/uL 128(L) 164 150   CMP Latest Ref Rng & Units 09/10/2019 09/09/2019 09/08/2019  Glucose 70 - 99 mg/dL 168(H) 124(H) 135(H)  BUN 8 - 23 mg/dL 33(H) 28(H) 24(H)  Creatinine 0.61 - 1.24 mg/dL 0.91 0.75 0.78  Sodium 135 - 145 mmol/L 136 135 135  Potassium 3.5 - 5.1 mmol/L 4.6 4.6 4.6  Chloride 98 - 111 mmol/L 109 108 108  CO2 22 - 32 mmol/L 21(L) 23 21(L)  Calcium 8.9 - 10.3 mg/dL 7.3(L) 7.5(L) 7.6(L)  Total Protein 6.5 - 8.1 g/dL 4.3(L) - -  Total Bilirubin 0.3 - 1.2 mg/dL 0.9 - -  Alkaline Phos 38 - 126 U/L 58 - -  AST 15 - 41 U/L 27  - -  ALT 0 - 44 U/L 11 - -     Imaging studies: No new pertinent imaging studies   Assessment/Plan:  86 y.o. male with  2 Days Post-Op s/p open gastrostomy and right anterior thigh muscle biopsy, complicated by pertinent comorbidities including:  Patient Active Problem List   Diagnosis Date Noted  . Autoimmune disease ([place])   . Palliative care by specialist   . Advanced care planning/counseling discussion   . Goals of care, counseling/discussion   . Hypomagnesemia   . Hypokalemia   . Aspiration pneumonia of right lower lobe due to gastric secretions ([place])   . Dysphagia   . C. difficile colitis   . Ulcer of esophagus without bleeding   . Lobar pneumonia ([place])   . Wide-complex tachycardia ([place])   . Sepsis ([place]) 08/24/2019  . Lactic acidosis 08/24/2019  . Anemia 08/24/2019  . Elevated brain natriuretic peptide (BNP) level 08/24/2019  . Protein-calorie malnutrition, severe 08/12/2019  . GIB (gastrointestinal bleeding) 08/10/2019  . GI bleed 08/09/2019  . Acute GI bleeding 08/04/2019  . Symptomatic anemia 08/04/2019  . Coagulopathy ([place]) 08/04/2019  . Metabolic acidosis 37/85/8850  . SIRS (systemic inflammatory response syndrome) ([place]) 08/04/2019  . UGI bleed 08/04/2019  . Dehydration 11/24/2018  . CKD (chronic kidney disease), stage III 11/24/2018  . Generalized weakness 11/22/2018  . Acute renal failure superimposed  on stage 3a chronic kidney disease ([place])   . Elevated troponin   . Hyperlipidemia 06/15/2014  . Benign essential hypertension 06/15/2013    -May advance tube feeds as desired, to goal. -- Ronny Bacon, M.D., [facility] 09/10/2019

## 2019-09-10 NOTE — TOC Progression Note (Signed)
Transition of Care [facility]) - Progression Note    Patient Details  Name: Albert [name] MRN: [id number] Date of Birth: 1933/10/15  [facility] CM/SW Contact  Zigmund Daniel Dorian Pod, RN Phone [place] 09/10/2019, 11:53 AM  Clinical Narrative:    Requested update on bed offers via MD. RN attempted to received an update on the facilities on bed availability. 2nd and 3rd choices unable to accommodate pt's care at this time with not enough staff or possibly tomorrow. Note [facility] available Tuesday with a bed. Pt not being discharged today spoke with attending to hold off on holding a bed due to ongoing treatment.   Will reassess tomorrow pt's status for discharge disposition. Initially 4 agencies with bed availability.  TOC will continue to follow.    Expected Discharge Plan: [place] Barriers to Discharge: Continued Medical Work up  Expected Discharge Plan and Services Expected Discharge Plan: [place] In-house Referral:  (Caregivers (family) that take turns in the home) Discharge Planning Services: CM Consult Post Acute Care Choice: [place]                                         Social Determinants of Health (SDOH) Interventions    Readmission Risk Interventions No flowsheet data found.

## 2019-09-10 NOTE — Progress Notes (Signed)
Patient informed and prepared for transfer to Room 212. Handoff report given and completed. VSS. Patient denies and discomfort at present. No SOB.Patient transferred with all belongings.

## 2019-09-11 LAB — CBC
HCT: 25.9 % — ABNORMAL LOW (ref 39.0–52.0)
Hemoglobin: 8.5 g/dL — ABNORMAL LOW (ref 13.0–17.0)
MCH: 28.2 pg (ref 26.0–34.0)
MCHC: 32.8 g/dL (ref 30.0–36.0)
MCV: 86 fL (ref 80.0–100.0)
Platelets: 106 10*3/uL — ABNORMAL LOW (ref 150–400)
RBC: 3.01 MIL/uL — ABNORMAL LOW (ref 4.22–5.81)
RDW: 18.3 % — ABNORMAL HIGH (ref 11.5–15.5)
WBC: 6.5 10*3/uL (ref 4.0–10.5)
nRBC: 0 % (ref 0.0–0.2)

## 2019-09-11 LAB — BASIC METABOLIC PANEL
Anion gap: 8 (ref 5–15)
BUN: 41 mg/dL — ABNORMAL HIGH (ref 8–23)
CO2: 21 mmol/L — ABNORMAL LOW (ref 22–32)
Calcium: 7.5 mg/dL — ABNORMAL LOW (ref 8.9–10.3)
Chloride: 108 mmol/L (ref 98–111)
Creatinine, Ser: 1.16 mg/dL (ref 0.61–1.24)
GFR calc Af Amer: >60 mL/min (ref >60)
GFR calc non Af Amer: 58 mL/min — ABNORMAL LOW (ref >60)
Glucose, Bld: 201 mg/dL — ABNORMAL HIGH (ref 70–99)
Potassium: 4.2 mmol/L (ref 3.5–5.1)
Sodium: 137 mmol/L (ref 135–145)

## 2019-09-11 LAB — SARS CORONAVIRUS 2 (TAT 6-24 HRS): SARS Coronavirus 2: NEGATIVE

## 2019-09-11 MED ORDER — PREDNISONE 20 MG PO TABS
20.0000 mg | ORAL_TABLET | Freq: Every day | ORAL | Status: DC
  Administered 2019-09-11 – 2019-09-12 (×2): 20 mg via ORAL
  Filled 2019-09-11 (×2): qty 1

## 2019-09-11 MED ORDER — FUROSEMIDE 10 MG/ML IJ SOLN
40.0000 mg | Freq: Every day | INTRAMUSCULAR | Status: DC
  Administered 2019-09-11 – 2019-09-12 (×2): 40 mg via INTRAVENOUS
  Filled 2019-09-11: qty 4

## 2019-09-11 MED ORDER — SODIUM CHLORIDE 0.9 % IV SOLN
INTRAVENOUS | Status: DC | PRN
  Administered 2019-09-11: 250 mL via INTRAVENOUS

## 2019-09-11 NOTE — TOC Progression Note (Addendum)
Transition of Care [facility]) - Progression Note    Patient Details  Name: Albert [name] MRN: [id number] Date of Birth: 03-22-1933  Transition of Care [facility]) CM/SW [place], LCSW Phone Number: 09/11/2019, 3:19 PM  Clinical Narrative:  Per MD, patient will likely be ready for discharge tomorrow. CSW left voicemail for [facility] admissions coordinator to confirm they would still have a bed. Asked MD for COVID test.   3:37 pm: [name] is anticipating a bed opening up tomorrow for them to be able to take him back. CSW provided information on current tube feed regimen.  Expected Discharge Plan: [place] Barriers to Discharge: Continued Medical Work up  Expected Discharge Plan and Services Expected Discharge Plan: [place] In-house Referral:  (Caregivers (family) that take turns in the home) Discharge Planning Services: CM Consult Post Acute Care Choice: [place]                                         Social Determinants of Health (SDOH) Interventions    Readmission Risk Interventions No flowsheet data found.

## 2019-09-11 NOTE — Care Management Important Message (Signed)
Important Message  Patient Details  Name: Albert [name] MRN: [id number] Date of Birth: 1933-09-17   Medicare Important Message Given:  Yes  Unable to reach patient via room phone. Spoke with Georga Bora to review Medicare IM.  Confirmed they received copy in mail that Admitting originally sent.     Dannette Barbara 09/11/2019, 1:40 PM

## 2019-09-11 NOTE — Progress Notes (Signed)
[facility] PROGRESS NOTE    Albert [name]  MGQ:[id number] DOB: 05/29/1933 DOA: 08/24/2019 PCP: [name], MD      Brief Narrative:  Albert [name] is an 86 y.o. M with hx HTN, CKD IIIa and recent hospitalization for sepsis and recurrent GI bleed who presented from SNF for fever and hypotension and diarrhea.   In the ER, noted to have sepsis secondary to aspiration pneumonia and diarrhea due to Cdiff colitis.  Of note, the patient multiple recent admissions:  7/29-8/3: admitted first for GI bleed due to duodenal ulcer and esophageal ulcer on EGD 8/4-8/9: readmitted immediately for melena and hypotension, no repeat EGD, melena was due to passing residual blood and hypotension was treated as presumptive sepsis with vancomycin, cefepime, and Flagyl for 5 days then cefdinir plus Flagyl for 2 more days, no cultures ever positive due to recurrent melena with hypotension, melena  8/19: readmitted this time with fever, weakness, hypotension  The patient responded to treatment for aspiration pneumonia and C diff colitis, but had a persistent severe generalized weakness as well as dysphagia to liquids or solids.  ENT, Neurology, Cardiology, and Palliative Care were consulted.   8/24: Neurology consulted, note a syndrome of generalized weakness, weight loss and muscle atrophy, head flexion weakness, proximal more than distal muscle weakness, and upper motor neuro signs  -MRI brain and c-spine showed no cerebral, white matter or spinal cord lesions, only a 45m pituitary lesion and edema of the right upper paraspinous muscles, muscle strain vs myositis  -ESR 60s, ANA+ and reflex SSA-Anti-Ro Ab+  -Anti-double stranded DNA and anti-smith negative  -RF-, aldolase negative, CK low  -Anti-HMGCr negative  -FSH/LH/prolactin/IGF/ACTH/Cortisol normal   8/25: ENT attempted to perform dilation of esophagus, but found to stricture  8/28: CT chest showed small pulmonary  embolism 8/30: IVC filter placement  8/31: Thoracentesis negative for malignancy  9/3: PEG tube placement and muscle biopsy        Assessment & Plan:  Sepsis due to aspiration pneumonia and C. difficile colitis Completed 13 days oral vancomyicn and 5 days Unasyn.  No further fever.  -Trend CBC and fever curve   Fluid overload No history of CHF, likely this is from steroids and IV fluids. Still swollen -Continue lasix -Daily BMP  Dysphagia PEG tube placed yesterday -Continue tube feeds  Weakness See summary above.  Given the postiive ANA and anti-Ro, Neurology suspect this is autoeimmune.    Patient given 5 days 1g solu-medrol They recommend discharge on prednisone 20 daily with close rheumatology follow up and close neurology follow up for EMG/NCS  this is autoimmune in nature -Follow muscle biopsy -Continue prednisone 20   Hypertension BP stable -Hold HCTZ -Continue lasix -Hold aspirin and atorvastatin -Continue metoprolol   Anemia  Stable  CKD stage IIIa Stable relative to baseline  Esophageal ulcer, duodenal ulcer, recent GI bleed -Continue pantoprazole  Severe protein calorie malnutrition As evidenced by severe loss of subcutaneous muscle mass and fat, chronic illness, poor p.o. intake.  Right calcified kidney nodule Incidental finding -Repeat CT or MRI without and with contrast in 12 months         Disposition: Status is: Inpatient  Remains inpatient appropriate because:Inpatient level of care appropriate due to severity of illness   Dispo: The patient is from: Home              Anticipated d/c is to: SNF              Anticipated  d/c date is: 1 day              Patient currently is not medically stable to d/c.              MDM: The below labs and imaging reports were reviewed and summarized above.  Medication management as above.  DVT prophylaxis:   Code Status: FULL Family Communication: none today             Subjective: No new vomiting, abdominal pain, fever, confusion    Objective: Vitals:   09/10/19 2056 09/11/19 0022 09/11/19 0518 09/11/19 1257  BP: (!) 119/55 (!) 101/50 (!) 142/71 121/70  Pulse: 65 (!) 59 71 66  Resp: '18 20 17 16  ' Temp: 97.7 F (36.5 C) 97.7 F (36.5 C) (!) 97.5 F (36.4 C)   TempSrc: Oral Oral Oral   SpO2: 96% 96% 96% 97%  Weight:      Height:        Intake/Output Summary (Last 24 hours) at 09/11/2019 1554 Last data filed at 09/11/2019 0524 Gross per 24 hour  Intake 2248.47 ml  Output 3450 ml  Net -1201.53 ml   Filed Weights   09/05/19 0500 09/08/19 0531 09/09/19 0518  Weight: 74.8 kg 75 kg 82.9 kg    Examination: General appearance: Chronically ill-appearing adult male, sleeping, arouses     HEENT:    Skin: Diffuse rash, resolving Cardiac: RRR, severe diffuse edema, improving, no murmurs. Respiratory: Lung sounds diminished, respiratory effort weak, normal respiratory rate, no rales or wheezing appreciated. Abdomen: No tenderness palpation or guarding, abdomen soft, no distention or ascites. MSK: Diffuse loss of subcutaneous muscle mass and fat. Neuro: Awake and alert, severe diffuse proximal weakness.  Speech fluent.  Strength symmetric. Psych: Attention diminished, affect blunted, judgment insight appear normal.      Data Reviewed: I have personally reviewed following labs and imaging studies:  CBC: Recent Labs  Lab 09/07/19 0627 09/08/19 0400 09/09/19 0443 09/10/19 1106 09/11/19 0430  WBC 3.5* 2.8* 14.5* 10.0 6.5  NEUTROABS  --  2.1  --   --   --   HGB 9.1* 9.2* 9.0* 8.0* 8.5*  HCT 27.7* 28.1* 28.0* 25.4* 25.9*  MCV 87.1 86.5 88.9 89.1 86.0  PLT 114* 150 164 128* 734*   Basic Metabolic Panel: Recent Labs  Lab 09/05/19 0434 09/05/19 0859 09/06/19 0452 09/06/19 0452 09/07/19 0627 09/08/19 0400 09/09/19 0443 09/10/19 0331 09/11/19 0430  NA 140  --  137   < > 136 135 135 136 137  K 5.3*   < > 5.4*   < > 4.7 4.6  4.6 4.6 4.2  CL 113*  --  110   < > 108 108 108 109 108  CO2 23  --  21*   < > 22 21* 23 21* 21*  GLUCOSE 88  --  127*   < > 159* 135* 124* 168* 201*  BUN 18  --  19   < > 21 24* 28* 33* 41*  CREATININE 0.67  --  0.70   < > 0.74 0.78 0.75 0.91 1.16  CALCIUM 7.2*  --  7.5*   < > 7.5* 7.6* 7.5* 7.3* 7.5*  MG 1.6*  --  1.9  --   --  1.9  --   --   --   PHOS 3.2  --  3.5  --   --   --   --   --   --    < > =  values in this interval not displayed.   GFR: Estimated Creatinine Clearance: 46.1 mL/min (by C-G formula based on SCr of 1.16 mg/dL). Liver Function Tests: Recent Labs  Lab 09/10/19 0331  AST 27  ALT 11  ALKPHOS 58  BILITOT 0.9  PROT 4.3*  ALBUMIN 1.5*   No results for input(s): LIPASE, AMYLASE in the last 168 hours. No results for input(s): AMMONIA in the last 168 hours. Coagulation Profile: Recent Labs  Lab 09/08/19 0400  INR 1.0   Cardiac Enzymes: No results for input(s): CKTOTAL, CKMB, CKMBINDEX, TROPONINI in the last 168 hours. BNP (last 3 results) No results for input(s): PROBNP in the last 8760 hours. HbA1C: No results for input(s): HGBA1C in the last 72 hours. CBG: No results for input(s): GLUCAP in the last 168 hours. Lipid Profile: No results for input(s): CHOL, HDL, LDLCALC, TRIG, CHOLHDL, LDLDIRECT in the last 72 hours. Thyroid Function Tests: No results for input(s): TSH, T4TOTAL, FREET4, T3FREE, THYROIDAB in the last 72 hours. Anemia Panel: No results for input(s): VITAMINB12, FOLATE, FERRITIN, TIBC, IRON, RETICCTPCT in the last 72 hours. Urine analysis:    Component Value Date/Time   COLORURINE YELLOW (A) 08/24/2019 2037   APPEARANCEUR HAZY (A) 08/24/2019 2037   LABSPEC 1.015 08/24/2019 2037   PHURINE 5.0 08/24/2019 2037   GLUCOSEU 50 (A) 08/24/2019 2037   HGBUR NEGATIVE 08/24/2019 2037   [place] NEGATIVE 08/24/2019 2037   KETONESUR 5 (A) 08/24/2019 2037   PROTEINUR NEGATIVE 08/24/2019 2037   NITRITE NEGATIVE 08/24/2019 2037    LEUKOCYTESUR NEGATIVE 08/24/2019 2037   Sepsis Labs: '@LABRCNTIP' (procalcitonin:4,lacticacidven:4)  ) Recent Results (from the past 240 hour(s))  Body fluid culture     Status: None   Collection Time: 09/05/19  3:15 PM   Specimen: PATH Cytology Pleural fluid  Result Value Ref Range Status   Specimen Description   Final    PLEURAL Performed at [facility], [address], [place], [place] [address]    Special Requests   Final    NONE Performed at [facility], [address] [place], [place] [address]    Gram Stain   Final    FEW WBC PRESENT, PREDOMINANTLY MONONUCLEAR NO ORGANISMS SEEN    Culture   Final    NO GROWTH 3 DAYS Performed at [facility], [place] [address] [place], [place] [address]    Report Status 09/09/2019 FINAL  Final  Acid Fast Smear (AFB)     Status: None   Collection Time: 09/05/19  3:15 PM   Specimen: PATH Cytology Pleural fluid  Result Value Ref Range Status   AFB Specimen Processing Concentration  Final   Acid Fast Smear Negative  Final    Comment: (NOTE) Performed At: [facility] [address] [place], [place] [id number] Rush Farmer MD PF:[id number]    Source (AFB) PLEURAL  Final    Comment: Performed at [facility], [address], [place], [place] [address]         Radiology Studies: No results found.      Scheduled Meds: . feeding supplement (PROSource TF)  45 mL Per Tube Daily  . free water  100 mL Per Tube Q4H  . furosemide  40 mg Intravenous Daily  . metoprolol tartrate  12.5 mg Oral BID  . pantoprazole  20 mg Oral BID AC  . predniSONE  20 mg Oral Q breakfast   Continuous Infusions: . feeding supplement (OSMOLITE 1.5 CAL) 1,000 mL (09/10/19 2018)  LOS: 17 days    Time spent: 35 minutes    Edwin Dada, MD [facility] 09/11/2019, 3:54 PM     Please page though [place] or Epic secure chat:  For [employer], Adult nurse

## 2019-09-12 LAB — CBC
HCT: 26 % — ABNORMAL LOW (ref 39.0–52.0)
Hemoglobin: 8.5 g/dL — ABNORMAL LOW (ref 13.0–17.0)
MCH: 28.8 pg (ref 26.0–34.0)
MCHC: 32.7 g/dL (ref 30.0–36.0)
MCV: 88.1 fL (ref 80.0–100.0)
Platelets: 108 10*3/uL — ABNORMAL LOW (ref 150–400)
RBC: 2.95 MIL/uL — ABNORMAL LOW (ref 4.22–5.81)
RDW: 18.4 % — ABNORMAL HIGH (ref 11.5–15.5)
WBC: 9 10*3/uL (ref 4.0–10.5)
nRBC: 0 % (ref 0.0–0.2)

## 2019-09-12 LAB — BASIC METABOLIC PANEL
Anion gap: 9 (ref 5–15)
BUN: 46 mg/dL — ABNORMAL HIGH (ref 8–23)
CO2: 24 mmol/L (ref 22–32)
Calcium: 7.2 mg/dL — ABNORMAL LOW (ref 8.9–10.3)
Chloride: 104 mmol/L (ref 98–111)
Creatinine, Ser: 0.92 mg/dL (ref 0.61–1.24)
GFR calc Af Amer: >60 mL/min (ref >60)
GFR calc non Af Amer: >60 mL/min (ref >60)
Glucose, Bld: 164 mg/dL — ABNORMAL HIGH (ref 70–99)
Potassium: 3.8 mmol/L (ref 3.5–5.1)
Sodium: 137 mmol/L (ref 135–145)

## 2019-09-12 MED ORDER — PROSOURCE TF PO LIQD: 45.0000 mL | Freq: Every day | ORAL | Status: AC

## 2019-09-12 MED ORDER — OSMOLITE 1.5 CAL PO LIQD: 1000.0000 mL | ORAL | 0 refills | Status: AC

## 2019-09-12 MED ORDER — FUROSEMIDE 40 MG PO TABS: 40.0000 mg | ORAL_TABLET | Freq: Two times a day (BID) | ORAL | 11 refills | Status: AC

## 2019-09-12 MED ORDER — METOPROLOL TARTRATE 25 MG PO TABS: 12.5000 mg | ORAL_TABLET | Freq: Two times a day (BID) | ORAL | Status: AC

## 2019-09-12 MED ORDER — PREDNISONE 20 MG PO TABS: 20.0000 mg | ORAL_TABLET | Freq: Every day | ORAL | Status: AC

## 2019-09-12 NOTE — Discharge Summary (Signed)
Physician Discharge Summary  Albert [name] GQQ:[id number] DOB: 08-08-1933 DOA: 08/24/2019  PCP: [name], MD  Admit date: 08/24/2019 Discharge date: 09/12/2019  Admitted From: Rehab Disposition:  SNF [place]  Recommendations for Outpatient Follow-up:  1. [facility]: Please arrange follow up with [facility] Neurology and [facility] Rheumatology for next available new patient appointment with Dr. Jennings Books and Dr. Jefm Bryant  2. Follow up with PCP Dr. Lovie Macadamia 1-2 weeks after discharge from SNF 3. Please obtain BMP in one week and stop or titrate down Lasix as needed 4. Dr. Lovie Macadamia: Please obtain follow up imaging of incidental kidney nodule in 12 months, see below 5. Dr. Jefm Bryant and Dr. Lovie Macadamia and Dr. Manuella Ghazi: Please follow up muscle biopsy       Home Health: N/A  Equipment/Devices: TBD at SNF  Discharge Condition: Poor  CODE STATUS: FULL Diet recommendation: Tube feeds  Brief/Interim Summary: Albert [name] is an 86 y.o. M with hx HTN, CKD IIIa and recent hospitalization for sepsis and recurrent GI bleed who presented from SNF for fever and hypotension and diarrhea.   In the ER, noted to have sepsis secondary to aspiration pneumonia and diarrhea due to Cdiff colitis.     PRINCIPAL HOSPITAL DIAGNOSIS: Sepsis due to aspiration pneumonia    Discharge Diagnoses:   Sepsis due to aspiration pneumonia and C. difficile colitis Completed 13 days oral vancomyicn and 5 days Unasyn.  No further fever.    Fluid overload The patient developed severe fluid overload during his stay, due to IV fluids and malnutrition/hypoalbuminemia and steroids.  No history of CHF, recent EF normal with only mild diastolic dysfunction, we do not feel this is cardiogenic/CHF.    Continue oral Lasix BID for 1 week.  Obtain BMP in 1 week and titrate Lasix as needed.   Weakness See summary above.  This is a major and ongoing issue, requiring close follow up with Rheumatology and  Neurology.  Given the postiive ANA and anti-Ro, our Neurology consultant here suspects this is autoimmune in nature.    The patient was treated with high dose steroids, Solu-medrol 1g daily for 5 days.  He should take prednisone 20 mg daily until he is seen by Rheumatology.    He should have close neurology follow up for EMG/NCS.    Dysphagia Result of weakness and suspected autoimmune disease.  PEG tube placed during this hospitalization.  Continue tube feeds.  Continue SLP therapy.     Hypertension Stop HCTZ.  Start metoprolol and Lasix.    Anemia of chronic kidney disease  CKD stage IIIa  Esophageal ulcer, duodenal ulcer, recent GI bleed Continue pantoprazole  Severe protein calorie malnutrition As evidenced by severe loss of subcutaneous muscle mass and fat, chronic illness, poor p.o. intake.  Right calcified kidney nodule Incidental finding -Repeat CT or MRI without and with contrast in 12 months         Hospital course: Of note, the patient has had multiple recent admissions:  7/29-8/3: admitted first for GI bleed due to duodenal ulcer and esophageal ulcer on EGD 8/4-8/9: readmitted immediately for melena and hypotension, no repeat EGD, melena was due to passing residual blood and hypotension was treated as presumptive sepsis with vancomycin, cefepime, and Flagyl for 5 days then cefdinir plus Flagyl for 2 more days, no cultures ever positive due to recurrent melena with hypotension, melena  8/19: readmitted this time with fever, weakness, hypotension   This time, patient admitted, started on empiric antibiotics.  He initially responded to  treatment for aspiration pneumonia and C diff colitis, but had a persistent severe generalized weakness as well as dysphagia to liquids or solids.  ENT, Neurology, Cardiology, and Palliative Care were consulted for evaluation of inability to swallow and generalized weakness.   8/24: Neurology consulted, noted  a syndrome of generalized weakness, weight loss and muscle atrophy, head flexion weakness, proximal more than distal muscle weakness, and upper motor neuro signs             -MRI brain and c-spine showed no cerebral, white matter or spinal cord lesions, only a 54m pituitary lesion and edema of the right upper paraspinous muscles, muscle strain vs myositis             -ESR 60s, ANA+ and reflex SSA-Anti-Ro Ab+             -Anti-double stranded DNA and anti-smith negative             -RF-, aldolase negative, CK low             -Anti-HMGCr negative             -FSH/LH/prolactin/IGF/ACTH/Cortisol normal   8/25: ENT attempted to perform dilation of esophagus, but found to stricture  8/28: CT chest showed small pulmonary embolism 8/30: IVC filter placement  8/31: Thoracentesis negative for malignancy  9/3: PEG tube placement and muscle biopsy         Discharge Instructions  Discharge Instructions    Diet - low sodium heart healthy   Complete by: As directed    Increase activity slowly   Complete by: As directed    No wound care   Complete by: As directed      Allergies as of 09/12/2019   No Active Allergies     Medication List    STOP taking these medications   aspirin EC 81 MG tablet   atorvastatin 20 MG tablet Commonly known as: LIPITOR   celecoxib 200 MG capsule Commonly known as: CELEBREX   hydrochlorothiazide 25 MG tablet Commonly known as: HYDRODIURIL   multivitamin with minerals Tabs tablet     TAKE these medications   feeding supplement (OSMOLITE 1.5 CAL) Liqd Place 1,000 mLs into feeding tube continuous.   feeding supplement (PROSource TF) liquid Place 45 mLs into feeding tube daily. Start taking on: September 13, 2019   furosemide 40 MG tablet Commonly known as: Lasix Take 1 tablet (40 mg total) by mouth 2 (two) times daily.   metoprolol tartrate 25 MG tablet Commonly known as: LOPRESSOR Take 0.5 tablets (12.5 mg total) by mouth 2 (two)  times daily.   ondansetron 4 MG tablet Commonly known as: ZOFRAN Take 1 tablet (4 mg total) by mouth every 6 (six) hours as needed for nausea.   pantoprazole 40 MG tablet Commonly known as: PROTONIX Take 1 tablet (40 mg total) by mouth daily.   predniSONE 20 MG tablet Commonly known as: DELTASONE Take 1 tablet (20 mg total) by mouth daily with breakfast. Start taking on: September 13, 2019       Follow-up Information    Dew, JErskine Squibb MD Follow up in 2 month(s).   Specialties: Vascular Surgery, Radiology, Interventional Cardiology Why: Can see Dew or FArna Medici Discuss possible IVC filter removal.  Contact information: [phone]        POlean Ree MD Follow up on 09/22/2019.   Specialty: General Surgery Why: Please set up an appointment for 9/17 for post-op check. Contact information:  [address] [place] [address] [phone]              No Active Allergies  Consultations:  Neurology  Cardiology  ENT  Palliative care   Procedures/Studies: CT ABDOMEN WO CONTRAST  Result Date: 09/07/2019 CLINICAL DATA:  Dysphagia and assessment for possible percutaneous gastrostomy tube placement. EXAM: CT ABDOMEN WITHOUT CONTRAST TECHNIQUE: Multidetector CT imaging of the abdomen was performed following the standard protocol without IV contrast. COMPARISON:  CT of the chest on 09/02/2019 FINDINGS: Lower chest: Significantly diminished right pleural effusion after recent thoracentesis. Small to moderate left pleural effusion. Hepatobiliary: No focal liver abnormality is seen. No gallstones, gallbladder wall thickening, or biliary dilatation. Pancreas: Unremarkable. No pancreatic ductal dilatation or surrounding inflammatory changes. Spleen: Normal in size without focal abnormality. Adrenals/Urinary Tract: Adrenal glands are unremarkable. Kidneys are normal, without renal calculi, focal lesion, or hydronephrosis.  Stomach/Bowel: Feeding tube present traversing the stomach and terminating in the transverse duodenum. No hiatal hernia. The stomach has a relative horizontal position and is almost completely located posterior to the transverse colon and splenic flexure. No evidence of bowel obstruction, visible bowel inflammation or free intraperitoneal air. Vascular/Lymphatic: Calcified plaque throughout the abdominal aorta without evidence of aneurysm. No enlarged lymph nodes identified in the abdomen. Other: No ascites or focal fluid collections. No hernias identified. Body wall edema present. Musculoskeletal: No acute or significant osseous findings. IMPRESSION: 1. Significantly diminished right pleural effusion after recent thoracentesis. 2. Small to moderate left pleural effusion. 3. The stomach has a relative horizontal position and is almost completely located posterior to the transverse colon and splenic flexure. The patient is not a candidate for percutaneous gastrostomy due to high risk of colonic injury. Electronically Signed   By: Aletta Edouard M.D.   On: 09/07/2019 16:32   CT CHEST W CONTRAST  Result Date: 09/02/2019 CLINICAL DATA:  Lung cancer screening, complicated hospital course, multiple hospitalizations, GI bleed, C difficile infection, aspiration pneumonia, question CLL on CBC, pulmonary infiltrates on chest radiographs EXAM: CT CHEST WITH CONTRAST TECHNIQUE: Multidetector CT imaging of the chest was performed during intravenous contrast administration. Sagittal and coronal MPR images reconstructed from axial data set. CONTRAST:  68m OMNIPAQUE IOHEXOL 300 MG/ML  SOLN IV. COMPARISON:  11/22/2018 Correlation: Chest radiograph 08/24/2019 FINDINGS: Cardiovascular: Atherosclerotic calcifications aorta, proximal great vessels, and coronary arteries. Heart upper normal sized. Mitral annular calcification noted. No pericardial effusion. Upper normal caliber ascending thoracic aorta 3.7 cm diameter. Ulcerated  plaque identified aortic arch best appreciated on sagittal series 6, image 90. Small pulmonary embolus in LEFT upper lobe pulmonary arterial branch image 35 series 2. No other definite pulmonary emboli. Mediastinum/Nodes: Feeding tube extends through esophagus into stomach. Base of cervical region normal appearance. Scattered normal sized mediastinal lymph nodes. No definite adenopathy. Lungs/Pleura: Moderate-sized BILATERAL pleural effusions. Significant compressive atelectasis of BILATERAL lower lobes. Mild compressive atelectasis of posterior RIGHT upper lobe. No pulmonary infiltrate or pneumothorax. No definite mass/nodule though lower lobes are inadequately assessed. Upper Abdomen: Beam hardening artifacts from patient's arms. Visualized upper abdomen grossly unremarkable. Musculoskeletal: Thoracic kyphosis. Osseous mineralization. No acute bony findings. IMPRESSION: Moderate BILATERAL pleural effusions and compressive atelectasis of primarily lower lobes, less RIGHT upper lobe. Small pulmonary embolus in a LEFT upper lobe pulmonary artery branch. Scattered atherosclerotic calcifications including coronary arteries. Aortic Atherosclerosis (ICD10-I70.0). [facility] on 09/02/2019 at 1611 hrs. Electronically Signed   By: MLavonia DanaM.D.   On: 09/02/2019 16:12   MR  BRAIN W WO CONTRAST  Result Date: 08/29/2019 CLINICAL DATA:  Initial evaluation for cranial neuropathy. EXAM: MRI HEAD WITHOUT AND WITH CONTRAST MRI CERVICAL SPINE WITHOUT AND WITH CONTRAST TECHNIQUE: Multiplanar, multiecho pulse sequences of the brain and surrounding structures, and cervical spine, to include the craniocervical junction and cervicothoracic junction, were obtained without and with intravenous contrast. An IAC protocol was utilized for the brain portion of this exam. CONTRAST:  52m GADAVIST GADOBUTROL 1 MMOL/ML IV SOLN COMPARISON:  Prior CT from 11/22/2018. FINDINGS: MRI HEAD FINDINGS Brain: Examination  somewhat technically limited by motion artifact. Additionally, no pre or postcontrast T1 weighted imaging of the full brain was performed due to technical air. Thin-section pre and postcontrast T1 weighted sequences through the skull base are available for review. Diffuse prominence of the CSF containing spaces compatible with generalized age-related cerebral atrophy. No significant cerebral white matter disease for age. No abnormal foci of restricted diffusion to suggest acute or subacute ischemia. Gray-white matter differentiation maintained. No encephalomalacia to suggest chronic cortical infarction. No foci of susceptibility artifact to suggest acute or chronic intracranial hemorrhage. No mass lesion, midline shift, or mass effect. No hydrocephalus or extra-axial fluid collection. Incidental note made of a 5 mm T1 hyperintense lesion at the anterior pituitary gland (series 10, image 11), nonspecific, but possibly a small proteinaceous pars intermedia cyst or Rathke's cleft cyst. Suprasellar region normal. Midline structures intact. Thin section imaging through the internal auditory canals was performed. Seventh and eighth cranial nerves are seen coursing normally through the cerebellopontine angle cisterns into the internal auditory canals. No CPA angle mass. No intracanalicular mass or abnormal enhancement. Inner ear structures including the vestibulae, cochlea, and semi circular canals are normal. No abnormality about either cranial nerve 9 or 10 bilaterally. No appreciable abnormal or pathologic enhancement at the skull base or visualized brain. Prominent bilateral mastoid and middle ear effusions noted, right slightly worse than left. Findings of uncertain significance. Visualized nasopharynx within normal limits. Vascular: Asymmetric FLAIR hyperintensity noted involving the right transverse and sigmoid sinuses without associated T1 correlate, favored to be related to slow flow. Major intracranial vascular  flow voids maintained elsewhere within the brain. Skull and upper cervical spine: Craniocervical junction within normal limits. Degenerative thickening noted about the tectorial membrane and dens without significant stenosis. Bone marrow signal intensity within normal limits. No scalp soft tissue abnormality. Sinuses/Orbits: Globes and orbital soft tissues within normal limits. Mild scattered mucosal thickening noted within the sphenoethmoidal sinuses. Nasogastric tube in place. Visualized paranasal sinuses are otherwise clear. Other: None. MRI CERVICAL SPINE FINDINGS Alignment: Examination somewhat technically limited by patient positioning and exaggeration of the normal thoracic kyphosis. Vertebral bodies normally aligned with preservation of the normal cervical lordosis. No significant listhesis. Vertebrae: Vertebral body height maintained without acute or chronic fracture. Bone marrow signal intensity within normal limits without discrete or worrisome osseous lesion. No abnormal marrow edema or enhancement. No findings to suggest osteomyelitis discitis or septic arthritis. Cord: Signal intensity within the cervical spinal cord is normal. No abnormal enhancement. Posterior Fossa, vertebral arteries, paraspinal tissues: Craniocervical junction within normal limits. There is diffuse edema with mild enhancement seen involving the upper posterior paraspinous musculature, left greater than right (series 33, image 15 on axial T2 weighted sequence, series 37, image 14 on axial post gad T1 weighted sequence). Finding of uncertain etiology. No loculated collections. Remainder of the visualized paraspinous soft tissues within normal limits. Normal flow voids seen within the vertebral arteries bilaterally. Disc levels: C2-C3: Minimal annular disc  bulge. Severe left with mild right facet hypertrophy. No spinal stenosis. Resultant moderate left C3 foraminal stenosis. No significant right foraminal narrowing. C3-C4: Disc  desiccation with mild diffuse disc bulge, eccentric to the left. Associated reactive endplate spurring with uncovertebral hypertrophy. Moderate left greater than right facet degeneration. Resultant mild spinal stenosis without cord impingement. Moderate left C4 foraminal stenosis. No significant right foraminal encroachment. C4-C5: Central disc protrusion indents the ventral thecal sac, contacting and mildly flattening the ventral spinal cord (series 34, image 14). Superimposed uncovertebral and facet hypertrophy, greater on the left. Resultant mild-to-moderate spinal stenosis. Severe left with moderate right C5 foraminal narrowing. C5-C6: Degenerative intervertebral disc space narrowing with diffuse disc osteophyte complex. Superimposed facet and ligament flavum hypertrophy. Resultant moderate spinal stenosis with mild cord flattening. Severe bilateral C6 foraminal stenosis. C6-C7: Diffuse disc bulge with bilateral uncovertebral hypertrophy. Mild-to-moderate facet degeneration. Resultant mild spinal stenosis without cord deformity. Moderate bilateral C7 foraminal stenosis. C7-T1: Disc desiccation without significant disc bulge. Bilateral facet hypertrophy. No significant spinal stenosis. Mild right C8 foraminal narrowing. No significant left foraminal encroachment. Visualized upper thoracic spine demonstrates no significant finding. IMPRESSION: MRI HEAD IMPRESSION: 1. No acute intracranial abnormality or structural findings to explain patient's symptoms identified. 2. Prominent bilateral mastoid and middle ear effusions, of uncertain clinical significance. Correlation with physical exam and any potential symptomatology recommended. 3. 5 mm T1 hyperintense lesion within the pituitary gland, indeterminate. No further imaging evaluation or follow-up is necessary. Consider endocrine function tests and correlate for history of pituitary hypersecretion. This follows ACR consensus guidelines: Management of Incidental  Pituitary Findings on CT, MRI and F18-FDG PET: A White Paper of the ACR Incidental Findings Committee. J Am Coll Radiol 2018; 15: 510-25. MRI CERVICAL SPINE IMPRESSION: 1. Diffuse edema and enhancement involving the left greater than right upper posterior paraspinous musculature. Finding is indeterminate, and could reflect sequelae of muscular injury/strain. Acute myositis could also be considered, which could be either infectious or inflammatory in nature. No loculated collections. 2. No other acute abnormality within the cervical spine. No cord signal changes to suggest myelopathy. 3. Multilevel cervical spondylosis with resultant mild to moderate diffuse spinal stenosis at C3-4 through C6-7, most pronounced at C5-6. Associated moderate to severe bilateral foraminal narrowing at C3 through C7 as above. Electronically Signed   By: Jeannine Boga M.D.   On: 08/29/2019 23:25   MR CERVICAL SPINE W WO CONTRAST  Result Date: 08/29/2019 CLINICAL DATA:  Initial evaluation for cranial neuropathy. EXAM: MRI HEAD WITHOUT AND WITH CONTRAST MRI CERVICAL SPINE WITHOUT AND WITH CONTRAST TECHNIQUE: Multiplanar, multiecho pulse sequences of the brain and surrounding structures, and cervical spine, to include the craniocervical junction and cervicothoracic junction, were obtained without and with intravenous contrast. An IAC protocol was utilized for the brain portion of this exam. CONTRAST:  69m GADAVIST GADOBUTROL 1 MMOL/ML IV SOLN COMPARISON:  Prior CT from 11/22/2018. FINDINGS: MRI HEAD FINDINGS Brain: Examination somewhat technically limited by motion artifact. Additionally, no pre or postcontrast T1 weighted imaging of the full brain was performed due to technical air. Thin-section pre and postcontrast T1 weighted sequences through the skull base are available for review. Diffuse prominence of the CSF containing spaces compatible with generalized age-related cerebral atrophy. No significant cerebral white matter  disease for age. No abnormal foci of restricted diffusion to suggest acute or subacute ischemia. Gray-white matter differentiation maintained. No encephalomalacia to suggest chronic cortical infarction. No foci of susceptibility artifact to suggest acute or chronic intracranial hemorrhage. No mass lesion, midline  shift, or mass effect. No hydrocephalus or extra-axial fluid collection. Incidental note made of a 5 mm T1 hyperintense lesion at the anterior pituitary gland (series 10, image 11), nonspecific, but possibly a small proteinaceous pars intermedia cyst or Rathke's cleft cyst. Suprasellar region normal. Midline structures intact. Thin section imaging through the internal auditory canals was performed. Seventh and eighth cranial nerves are seen coursing normally through the cerebellopontine angle cisterns into the internal auditory canals. No CPA angle mass. No intracanalicular mass or abnormal enhancement. Inner ear structures including the vestibulae, cochlea, and semi circular canals are normal. No abnormality about either cranial nerve 9 or 10 bilaterally. No appreciable abnormal or pathologic enhancement at the skull base or visualized brain. Prominent bilateral mastoid and middle ear effusions noted, right slightly worse than left. Findings of uncertain significance. Visualized nasopharynx within normal limits. Vascular: Asymmetric FLAIR hyperintensity noted involving the right transverse and sigmoid sinuses without associated T1 correlate, favored to be related to slow flow. Major intracranial vascular flow voids maintained elsewhere within the brain. Skull and upper cervical spine: Craniocervical junction within normal limits. Degenerative thickening noted about the tectorial membrane and dens without significant stenosis. Bone marrow signal intensity within normal limits. No scalp soft tissue abnormality. Sinuses/Orbits: Globes and orbital soft tissues within normal limits. Mild scattered mucosal  thickening noted within the sphenoethmoidal sinuses. Nasogastric tube in place. Visualized paranasal sinuses are otherwise clear. Other: None. MRI CERVICAL SPINE FINDINGS Alignment: Examination somewhat technically limited by patient positioning and exaggeration of the normal thoracic kyphosis. Vertebral bodies normally aligned with preservation of the normal cervical lordosis. No significant listhesis. Vertebrae: Vertebral body height maintained without acute or chronic fracture. Bone marrow signal intensity within normal limits without discrete or worrisome osseous lesion. No abnormal marrow edema or enhancement. No findings to suggest osteomyelitis discitis or septic arthritis. Cord: Signal intensity within the cervical spinal cord is normal. No abnormal enhancement. Posterior Fossa, vertebral arteries, paraspinal tissues: Craniocervical junction within normal limits. There is diffuse edema with mild enhancement seen involving the upper posterior paraspinous musculature, left greater than right (series 33, image 15 on axial T2 weighted sequence, series 37, image 14 on axial post gad T1 weighted sequence). Finding of uncertain etiology. No loculated collections. Remainder of the visualized paraspinous soft tissues within normal limits. Normal flow voids seen within the vertebral arteries bilaterally. Disc levels: C2-C3: Minimal annular disc bulge. Severe left with mild right facet hypertrophy. No spinal stenosis. Resultant moderate left C3 foraminal stenosis. No significant right foraminal narrowing. C3-C4: Disc desiccation with mild diffuse disc bulge, eccentric to the left. Associated reactive endplate spurring with uncovertebral hypertrophy. Moderate left greater than right facet degeneration. Resultant mild spinal stenosis without cord impingement. Moderate left C4 foraminal stenosis. No significant right foraminal encroachment. C4-C5: Central disc protrusion indents the ventral thecal sac, contacting and  mildly flattening the ventral spinal cord (series 34, image 14). Superimposed uncovertebral and facet hypertrophy, greater on the left. Resultant mild-to-moderate spinal stenosis. Severe left with moderate right C5 foraminal narrowing. C5-C6: Degenerative intervertebral disc space narrowing with diffuse disc osteophyte complex. Superimposed facet and ligament flavum hypertrophy. Resultant moderate spinal stenosis with mild cord flattening. Severe bilateral C6 foraminal stenosis. C6-C7: Diffuse disc bulge with bilateral uncovertebral hypertrophy. Mild-to-moderate facet degeneration. Resultant mild spinal stenosis without cord deformity. Moderate bilateral C7 foraminal stenosis. C7-T1: Disc desiccation without significant disc bulge. Bilateral facet hypertrophy. No significant spinal stenosis. Mild right C8 foraminal narrowing. No significant left foraminal encroachment. Visualized upper thoracic spine demonstrates no significant  finding. IMPRESSION: MRI HEAD IMPRESSION: 1. No acute intracranial abnormality or structural findings to explain patient's symptoms identified. 2. Prominent bilateral mastoid and middle ear effusions, of uncertain clinical significance. Correlation with physical exam and any potential symptomatology recommended. 3. 5 mm T1 hyperintense lesion within the pituitary gland, indeterminate. No further imaging evaluation or follow-up is necessary. Consider endocrine function tests and correlate for history of pituitary hypersecretion. This follows ACR consensus guidelines: Management of Incidental Pituitary Findings on CT, MRI and F18-FDG PET: A White Paper of the ACR Incidental Findings Committee. J Am Coll Radiol 2018; 15: 808-81. MRI CERVICAL SPINE IMPRESSION: 1. Diffuse edema and enhancement involving the left greater than right upper posterior paraspinous musculature. Finding is indeterminate, and could reflect sequelae of muscular injury/strain. Acute myositis could also be considered, which  could be either infectious or inflammatory in nature. No loculated collections. 2. No other acute abnormality within the cervical spine. No cord signal changes to suggest myelopathy. 3. Multilevel cervical spondylosis with resultant mild to moderate diffuse spinal stenosis at C3-4 through C6-7, most pronounced at C5-6. Associated moderate to severe bilateral foraminal narrowing at C3 through C7 as above. Electronically Signed   By: Jeannine Boga M.D.   On: 08/29/2019 23:25   MR ABDOMEN W WO CONTRAST  Result Date: 08/31/2019 CLINICAL DATA:  Evaluate kidney cyst. EXAM: MRI ABDOMEN WITHOUT AND WITH CONTRAST TECHNIQUE: Multiplanar multisequence MR imaging of the abdomen was performed both before and after the administration of intravenous contrast. CONTRAST:  70m GADAVIST GADOBUTROL 1 MMOL/ML IV SOLN COMPARISON:  MRI scratch set CT 08/09/2019 FINDINGS: Lower chest: Moderate bilateral pleural effusions with overlying compressive type atelectasis. Hepatobiliary: No focal liver abnormality identified. Tiny stones and/or sludge noted layering within the dependent portion of the gallbladder. No gallbladder wall thickening. Pericholecystic fluid noted. No intrahepatic bile duct dilatation. Normal caliber common bile duct. Pancreas: No mass, inflammatory changes, or other parenchymal abnormality identified. Spleen:  Within normal limits in size and appearance. Adrenals/Urinary Tract: Normal appearance of the adrenal glands. 9 mm calcified lesion arising from the medial cortex of the inferior pole of the right kidney is identified, image 27/11. Unchanged. No definite solid enhancing component noted. Several subcentimeter cystic lesions are noted within the left kidney. The largest is in the left mid parapelvic region measuring 0.7 cm. These are technically too small to reliably characterize. No hydronephrosis identified bilaterally. Stomach/Bowel: Small hiatal hernia. Stomach is nondistended. No dilated loops of  bowel identified. Numerous colonic diverticula noted. Vascular/Lymphatic: Extensive aortic atherosclerosis without aneurysm. The portal vein and splenic vein appear patent. No upper abdominal adenopathy. Other: Small to moderate volume of ascites is identified and appears increased from previous exam. There is mesenteric edema as well as diffuse body wall edema compatible with anasarca. Musculoskeletal: No suspicious bone lesions identified. IMPRESSION: 1. Stable appearance of calcified lesion arising from the medial cortex of the inferior pole of the right kidney. No definite solid enhancing component noted. Finding is favored to represent a benign process. Consider follow-up imaging in 12 months with repeat CT or MRI without and with contrast material. 2. Small to moderate volume of ascites, mesenteric edema and diffuse body wall edema compatible with anasarca. 3. Moderate bilateral pleural effusions with overlying compressive type atelectasis. 4. Small hiatal hernia. 5. Sludge and/or stones identified layering within the gallbladder. No signs of gallbladder wall inflammation or biliary ductal dilatation. 6.  Aortic Atherosclerosis (ICD10-I70.0). Electronically Signed   By: TKerby MoorsM.D.   On: 08/31/2019 05:16  PERIPHERAL VASCULAR CATHETERIZATION  Result Date: 09/04/2019 See op note  US Venous Img Lower Bilateral (DVT)  Result Date: 09/03/2019 CLINICAL DATA:  86 year old with small pulmonary emboli on recent chest CT. EXAM: BILATERAL LOWER EXTREMITY VENOUS DOPPLER ULTRASOUND TECHNIQUE: Gray-scale sonography with graded compression, as well as color Doppler and duplex ultrasound were performed to evaluate the lower extremity deep venous systems from the level of the common femoral vein and including the common femoral, femoral, profunda femoral, popliteal and calf veins including the posterior tibial, peroneal and gastrocnemius veins when visible. The superficial great saphenous vein was also  interrogated. Spectral Doppler was utilized to evaluate flow at rest and with distal augmentation maneuvers in the common femoral, femoral and popliteal veins. COMPARISON:  None. FINDINGS: RIGHT LOWER EXTREMITY Common Femoral Vein: No evidence of thrombus. Normal compressibility, respiratory phasicity and response to augmentation. Saphenofemoral Junction: No evidence of thrombus. Normal compressibility and flow on color Doppler imaging. Profunda Femoral Vein: No evidence of thrombus. Normal compressibility and flow on color Doppler imaging. Femoral Vein: No evidence of thrombus. Normal compressibility, respiratory phasicity and response to augmentation. Popliteal Vein: No evidence of thrombus. Normal compressibility, respiratory phasicity and response to augmentation. Calf Veins: No evidence of thrombus. Normal compressibility and flow on color Doppler imaging. Other Findings:  None. LEFT LOWER EXTREMITY Common Femoral Vein: No evidence of thrombus. Normal compressibility, respiratory phasicity and response to augmentation. Saphenofemoral Junction: No evidence of thrombus. Normal compressibility and flow on color Doppler imaging. Profunda Femoral Vein: No evidence of thrombus. Normal compressibility and flow on color Doppler imaging. Femoral Vein: No evidence of thrombus. Normal compressibility, respiratory phasicity and response to augmentation. Popliteal Vein: No evidence of thrombus. Normal compressibility, respiratory phasicity and response to augmentation. Calf Veins: No evidence of thrombus. Normal compressibility and flow on color Doppler imaging. Other Findings: Extensive edema in the lower extremities and difficult to completely compress many of the vessels. IMPRESSION: No evidence of deep venous thrombosis in either lower extremity. Electronically Signed   By: Markus Daft M.D.   On: 09/03/2019 09:01   DG Chest Port 1 View  Result Date: 09/05/2019 CLINICAL DATA:  Post right thoracentesis EXAM: PORTABLE  CHEST 1 VIEW COMPARISON:  CT chest 09/02/2019, radiograph 08/24/2019 FINDINGS: Small residual right effusion. Small to moderate layering left effusion is present as well. Mixed hazy and streaky opacities in the lung bases could reflect some residual atelectatic change and/or re-expansion edematous features. No pneumothorax. Stable cardiomegaly with a calcified, tortuous aorta. No acute osseous or soft tissue abnormality. Degenerative changes are present in the imaged spine and shoulders. Telemetry leads and nasal cannula overlie the chest. Transesophageal tube remains in place with the tip distal to the GE junction, below the margin of imaging. IMPRESSION: 1. Small residual right effusion. Small to moderate layering left effusion. 2. No pneumothorax. 3. Mixed hazy and streaky opacities in the lung bases most likely reflect some residual atelectatic change and/or re-expansion edematous features. Infection/consolidation cannot be fully excluded. Electronically Signed   By: Lovena Le M.D.   On: 09/05/2019 16:07   DG Chest Port 1 View  Result Date: 08/24/2019 CLINICAL DATA:  Fever and tachycardia. EXAM: PORTABLE CHEST 1 VIEW COMPARISON:  August 09, 2019 FINDINGS: Mild, diffuse chronic appearing increased lung markings are seen. Mild right basilar infiltrate is present. There is no evidence of a pleural effusion or pneumothorax. The cardiac silhouette is mildly enlarged. Moderate severity calcification of the aortic arch is noted. The visualized skeletal structures are unremarkable. IMPRESSION: 1.  Chronic appearing increased lung markings with mild right basilar infiltrate. Electronically Signed   By: Virgina Norfolk M.D.   On: 08/24/2019 21:00   DG Naso G Tube Plc W/Fl W/Rad  Result Date: 08/31/2019 CLINICAL DATA:  Dobbhoff tube placement. EXAM: NASO G TUBE PLACEMENT WITH FL AND WITH RAD CONTRAST:  None. FLUOROSCOPY TIME:  Fluoroscopy Time:  5 minutes 12 seconds Radiation Exposure Index (if provided by the  fluoroscopic device): 98.2 mGy COMPARISON:  Prior study 08/29/2019. FINDINGS: Dobbhoff tube was advanced in usual fashion down the esophagus/stomach and into the proximal duodenum under fluoroscopic guidance. There were no complications. Dobbhoff tube secured and patient sent back to his room in stable condition. IMPRESSION: Successful Dobbhoff tube placement under fluoroscopic guidance. Tube tip positioned in the proximal duodenum. Electronically Signed   By: [place]   On: 08/31/2019 10:14   DG Addison Bailey G Tube Plc W/Fl W/Rad  Result Date: 08/29/2019 CLINICAL DATA:  Oro pharyngeal dysplasia.  Feeding tube placement. EXAM: NASO G TUBE PLACEMENT WITH FL AND WITH RAD FLUOROSCOPY TIME:  Fluoroscopy Time:  3 minutes and 12 seconds COMPARISON:  None. FINDINGS: Weighted tip feeding tube was placed using fluoroscopic guidance. Water-soluble contrast was administered to confirm positioning at the stomach pylorus/duodenal bulb. IMPRESSION: Weighted tip feeding tube placed using fluoroscopic guidance. Feeding tube is appropriately positioned with tip at the stomach pylorus/duodenal bulb. Electronically Signed   By: Franki Cabot M.D.   On: 08/29/2019 13:20   DG Swallowing Func-Speech Pathology  Result Date: 08/31/2019 Objective Swallowing Evaluation: Type of Study: MBS-Modified Barium Swallow Study  Patient Details Name: SEANPATRICK MAISANO MRN: [id number] Date of Birth: 1933/12/20 Today's Date: 08/31/2019 Time: SLP Start Time (ACUTE ONLY): 0900 -SLP Stop Time (ACUTE ONLY): 0924 SLP Time Calculation (min) (ACUTE ONLY): 24 min Past Medical History: Past Medical History: Diagnosis Date . Actinic keratosis 04/23/2008  L elbow distal tricep (bx proven) . Basal cell carcinoma 06/17/2006  L preauricular  . Basal cell carcinoma 05/26/2012  L ant neck  . Basal cell carcinoma 11/02/2012  Glabella - excision  . Basal cell carcinoma 08/15/2014  L med ankle  . Basal cell carcinoma 02/23/2017  R lat infrapectoral - excision . Basal  cell carcinoma 12/08/2017  L mid to distal lat volar forearm  . Hypertension  . Squamous cell carcinoma of skin 02/14/2014  R lat low back - ED&C . Squamous cell carcinoma of skin 10/02/2014  R prox lat bicep - ED&C Past Surgical History: Past Surgical History: Procedure Laterality Date . COLONOSCOPY N/A 08/05/2019  Procedure: COLONOSCOPY;  Surgeon: Toledo, Benay Pike, MD;  Location: [facility];  Service: Gastroenterology;  Laterality: N/A; . ESOPHAGEAL DILATION N/A 08/30/2019  Procedure: ESOPHAGEAL DILATION;  Surgeon: Margaretha Sheffield, MD;  Location: [facility];  Service: ENT;  Laterality: N/A; . ESOPHAGOGASTRODUODENOSCOPY N/A 08/05/2019  Procedure: ESOPHAGOGASTRODUODENOSCOPY (EGD);  Surgeon: Toledo, Benay Pike, MD;  Location: [facility];  Service: Gastroenterology;  Laterality: N/A; . ESOPHAGOSCOPY N/A 08/30/2019  Procedure: ESOPHAGOSCOPY;  Surgeon: Margaretha Sheffield, MD;  Location: [facility];  Service: ENT;  Laterality: N/A; HPI: CALEY VOLKERT is a 86 y.o. male with PMH as noted below presents from his facility with fever and low blood pressure today.  Previously he had been admitted for upper GI bleed, EGD performed and pt diagnosed with esophageal and duodenal ulcers at the end of last month.  Chest x-ray indicates chronic appearing increased lung markings with mild right basilar infiltrate. ST consulted as pt reports difficulty swallowing.  Subjective: pt  pleasant, conversant, fair historian Assessment / Plan / Recommendation CHL IP CLINICAL IMPRESSIONS 08/31/2019 Clinical Impression Pt continues to present with severe pharyngoesophageal dysphagia that has worsened since previous MBSS on 08/28/2019. As such he is at an Dora (even with his own salvia). Of note, pt's UES was dilated 08/30/2019. During this study, pt's UES opened less than previous study. Therefore more of EACH BOLUS was SILENTLY ASPIRATED with almost all of the puree bolus was aspirated. Massive amounts of residue remained in  throughout the pharynx. Pt was not able to clear with increased swallows. During the repeat swallows, he actually aspirated the residue during the attempt to swallow. Compensatory swallow strategies were attempted but none were effective. Pt attempted to "hock" up pharyngeal residue at the end. He was able to expell some of the bolus but not enough to clear his pharynx. Essentially pt has frank silent aspiration before, during and after the swallow across thin liquids, nectar thick liquids and puree. At this time, it appears that pt's decrease hyolaryngeal excursion and opening of the UES is likely neuromuscular in nature. Recommend long-term nutritional support. Recommend STRICT ORAL CARE to REDUCE bacterial load present in pt's salvia.  SLP Visit Diagnosis Dysphagia, pharyngoesophageal phase (R13.14) Attention and concentration deficit following -- Frontal lobe and executive function deficit following -- Impact on safety and function Severe aspiration risk;Risk for inadequate nutrition/hydration   CHL IP TREATMENT RECOMMENDATION 08/31/2019 Treatment Recommendations No treatment recommended at this time   Prognosis 08/31/2019 Prognosis for Safe Diet Advancement Guarded Barriers to Reach Goals Severity of deficits Barriers/Prognosis Comment -- CHL IP DIET RECOMMENDATION 08/31/2019 SLP Diet Recommendations NPO Liquid Administration via -- Medication Administration Via alternative means Compensations -- Postural Changes --   CHL IP OTHER RECOMMENDATIONS 08/31/2019 Recommended Consults -- Oral Care Recommendations Oral care QID Other Recommendations Remove water pitcher;Prohibited food (jello, ice cream, thin soups);Have oral suction available   CHL IP FOLLOW UP RECOMMENDATIONS 08/31/2019 Follow up Recommendations Skilled Nursing facility   [facility] IP FREQUENCY AND DURATION 08/28/2019 Speech Therapy Frequency (ACUTE ONLY) min 2x/week Treatment Duration 2 weeks      CHL IP ORAL PHASE 08/31/2019 Oral Phase Impaired Oral - Pudding  Teaspoon -- Oral - Pudding Cup -- Oral - Honey Teaspoon -- Oral - Honey Cup -- Oral - Nectar Teaspoon Weak lingual manipulation;Lingual pumping;Reduced posterior propulsion;Piecemeal swallowing;Delayed oral transit;Decreased bolus cohesion;Premature spillage Oral - Nectar Cup -- Oral - Nectar Straw Weak lingual manipulation;Lingual pumping;Reduced posterior propulsion;Piecemeal swallowing;Decreased bolus cohesion;Delayed oral transit;Premature spillage Oral - Thin Teaspoon Weak lingual manipulation;Lingual pumping;Reduced posterior propulsion;Piecemeal swallowing;Delayed oral transit;Decreased bolus cohesion;Premature spillage Oral - Thin Cup -- Oral - Thin Straw Weak lingual manipulation;Lingual pumping;Reduced posterior propulsion;Piecemeal swallowing;Delayed oral transit;Decreased bolus cohesion;Premature spillage Oral - Puree Weak lingual manipulation;Lingual pumping;Reduced posterior propulsion;Piecemeal swallowing;Delayed oral transit;Decreased bolus cohesion;Premature spillage Oral - Mech Soft -- Oral - Regular -- Oral - Multi-Consistency -- Oral - Pill -- Oral Phase - Comment more impaired over previous MBSS on 08/28/2019  CHL IP PHARYNGEAL PHASE 08/31/2019 Pharyngeal Phase Impaired Pharyngeal- Pudding Teaspoon -- Pharyngeal -- Pharyngeal- Pudding Cup -- Pharyngeal -- Pharyngeal- Honey Teaspoon -- Pharyngeal -- Pharyngeal- Honey Cup -- Pharyngeal -- Pharyngeal- Nectar Teaspoon Delayed swallow initiation-vallecula;Delayed swallow initiation-pyriform sinuses;Reduced epiglottic inversion;Reduced anterior laryngeal mobility;Reduced laryngeal elevation;Reduced airway/laryngeal closure;Penetration/Aspiration before swallow;Penetration/Aspiration during swallow;Penetration/Apiration after swallow;Significant aspiration (Amount);Pharyngeal residue - valleculae;Pharyngeal residue - pyriform;Pharyngeal residue - posterior pharnyx;Pharyngeal residue - cp segment;Reduced pharyngeal peristalsis Pharyngeal Material enters  airway, passes BELOW cords without attempt by patient to eject out (  silent aspiration) Pharyngeal- Nectar Cup -- Pharyngeal -- Pharyngeal- Nectar Straw Delayed swallow initiation-vallecula;Delayed swallow initiation-pyriform sinuses;Reduced pharyngeal peristalsis;Reduced epiglottic inversion;Reduced anterior laryngeal mobility;Reduced laryngeal elevation;Reduced airway/laryngeal closure;Penetration/Aspiration before swallow;Penetration/Aspiration during swallow;Penetration/Apiration after swallow;Significant aspiration (Amount);Pharyngeal residue - valleculae;Pharyngeal residue - pyriform;Pharyngeal residue - posterior pharnyx;Pharyngeal residue - cp segment Pharyngeal Material enters airway, passes BELOW cords without attempt by patient to eject out (silent aspiration) Pharyngeal- Thin Teaspoon Delayed swallow initiation-vallecula;Delayed swallow initiation-pyriform sinuses;Reduced pharyngeal peristalsis;Reduced epiglottic inversion;Reduced anterior laryngeal mobility;Reduced laryngeal elevation;Reduced airway/laryngeal closure;Penetration/Aspiration before swallow;Penetration/Aspiration during swallow;Penetration/Apiration after swallow;Significant aspiration (Amount);Pharyngeal residue - valleculae;Pharyngeal residue - pyriform;Pharyngeal residue - posterior pharnyx;Pharyngeal residue - cp segment Pharyngeal Material enters airway, passes BELOW cords without attempt by patient to eject out (silent aspiration) Pharyngeal- Thin Cup NT Pharyngeal -- Pharyngeal- Thin Straw NT Pharyngeal -- Pharyngeal- Puree Delayed swallow initiation-vallecula;Delayed swallow initiation-pyriform sinuses;Reduced pharyngeal peristalsis;Reduced epiglottic inversion;Reduced anterior laryngeal mobility;Reduced laryngeal elevation;Reduced airway/laryngeal closure;Penetration/Aspiration before swallow;Penetration/Aspiration during swallow;Penetration/Apiration after swallow;Significant aspiration (Amount);Pharyngeal residue -  valleculae;Pharyngeal residue - pyriform;Pharyngeal residue - posterior pharnyx;Pharyngeal residue - cp segment Pharyngeal Material enters airway, passes BELOW cords without attempt by patient to eject out (silent aspiration) Pharyngeal- Mechanical Soft -- Pharyngeal -- Pharyngeal- Regular -- Pharyngeal -- Pharyngeal- Multi-consistency -- Pharyngeal -- Pharyngeal- Pill -- Pharyngeal -- Pharyngeal Comment --  CHL IP CERVICAL ESOPHAGEAL PHASE 08/31/2019 Cervical Esophageal Phase Impaired Pudding Teaspoon -- Pudding Cup -- Honey Teaspoon -- Honey Cup -- Nectar Teaspoon Reduced cricopharyngeal relaxation Nectar Cup -- Nectar Straw Reduced cricopharyngeal relaxation Thin Teaspoon Reduced cricopharyngeal relaxation Thin Cup NT Thin Straw NT Puree Reduced cricopharyngeal relaxation Mechanical Soft -- Regular -- Multi-consistency -- Pill -- Cervical Esophageal Comment less UES opening for bolus than previous study on 08/28/2019 Happi B. Rutherford Nail M.S., CCC-SLP, [place] Office [phone] Stormy Fabian 08/31/2019, 12:47 PM              DG Swallowing Func-Speech Pathology  Result Date: 08/28/2019 Objective Swallowing Evaluation: Type of Study: MBS-Modified Barium Swallow Study  Patient Details Name: JUANJESUS PEPPERMAN MRN: [id number] Date of Birth: [birth year] Today's Date: 08/28/2019 Time: SLP Start Time (ACUTE ONLY): 9509 -SLP Stop Time (ACUTE ONLY): 0935 SLP Time Calculation (min) (ACUTE ONLY): 30 min Past Medical History: Past Medical History: Diagnosis Date . Actinic keratosis 04/23/2008  L elbow distal tricep (bx proven) . Basal cell carcinoma 06/17/2006  L preauricular  . Basal cell carcinoma 05/26/2012  L ant neck  . Basal cell carcinoma 11/02/2012  Glabella - excision  . Basal cell carcinoma 08/15/2014  L med ankle  . Basal cell carcinoma 02/23/2017  R lat infrapectoral - excision . Basal cell carcinoma 12/08/2017  L mid to distal lat volar forearm  . Hypertension  . Squamous  cell carcinoma of skin 02/14/2014  R lat low back - ED&C . Squamous cell carcinoma of skin 10/02/2014  R prox lat bicep - ED&C Past Surgical History: Past Surgical History: Procedure Laterality Date . COLONOSCOPY N/A 08/05/2019  Procedure: COLONOSCOPY;  Surgeon: Toledo, Benay Pike, MD;  Location: [facility];  Service: Gastroenterology;  Laterality: N/A; . ESOPHAGOGASTRODUODENOSCOPY N/A 08/05/2019  Procedure: ESOPHAGOGASTRODUODENOSCOPY (EGD);  Surgeon: Toledo, Benay Pike, MD;  Location: [facility];  Service: Gastroenterology;  Laterality: N/A; HPI: ELAND LAMANTIA is a 86 y.o. male with PMH as noted below presents from his facility with fever and low blood pressure today.  Previously he had been admitted for upper GI bleed, EGD performed and pt diagnosed with esophageal and duodenal ulcers at the end of last month.  Chest  x-ray indicates chronic appearing increased lung markings with mild right basilar infiltrate. ST consulted as pt reports difficulty swallowing.  Subjective: pt pleasant, conversant, fair historian Assessment / Plan / Recommendation CHL IP CLINICAL IMPRESSIONS 08/28/2019 Clinical Impression Pt presents with severe sensori-motor oropharyngeal dysphagia. Pt is at an extremely high risk of aspiration. Pt's oral phase is c/b decreased lingual manipulation of nectar thick liquids, thin liquids and puree. This results in difficulty with posterior movement of boluses and premature spillage. Pt's pharyngeal phase is c/b swallow initiation at the pyriform sinuses, decreased epiglottic inversion, reduced anteriorn laryngeal mobility and reduced laryngeal elevation. This results in decreased glottal closure and significantly reduced cricopharyngus relaxation.  Pt aspirates the majority of boluses during the swallow and less than 10% of teaspoon bolus passes thru UES which results in significant pharyngeal residue in the vallecula, pyrifrom sinuses and residue at the cricopharyngeal segment (the residue here  is what appears to present a globus sensation). All aspiration was silent except for 1 event but pt's was not able to clear the aspirates with coughing. Although puree doesn't enter his airway, he is at a HURGE risk of aspirating the residue left within his pharynx. The eitology of pt's inadequate duration and amplitude of UES opening is unclear. To help with differential diagnosis of neuromuscular vs physiological eitology recommend consult to neurology and ENT. Given pt's high aspiration risk and decreased ability to maintain his nutritional status, recommend alternate nutrition and continued NPO status.   SLP Visit Diagnosis Dysphagia, pharyngoesophageal phase (R13.14) Attention and concentration deficit following -- Frontal lobe and executive function deficit following -- Impact on safety and function Severe aspiration risk;Risk for inadequate nutrition/hydration   CHL IP TREATMENT RECOMMENDATION 08/28/2019 Treatment Recommendations Therapy as outlined in treatment plan below   Prognosis 08/28/2019 Prognosis for Safe Diet Advancement Guarded Barriers to Reach Goals Severity of deficits Barriers/Prognosis Comment -- CHL IP DIET RECOMMENDATION 08/28/2019 SLP Diet Recommendations NPO Liquid Administration via -- Medication Administration Via alternative means Compensations -- Postural Changes --   CHL IP OTHER RECOMMENDATIONS 08/28/2019 Recommended Consults Consider ENT evaluation Oral Care Recommendations Oral care QID Other Recommendations Remove water pitcher;Prohibited food (jello, ice cream, thin soups);Have oral suction available   CHL IP FOLLOW UP RECOMMENDATIONS 08/28/2019 Follow up Recommendations (No Data)   CHL IP FREQUENCY AND DURATION 08/28/2019 Speech Therapy Frequency (ACUTE ONLY) min 2x/week Treatment Duration 2 weeks      CHL IP ORAL PHASE 08/28/2019 Oral Phase Impaired Oral - Pudding Teaspoon -- Oral - Pudding Cup -- Oral - Honey Teaspoon -- Oral - Honey Cup -- Oral - Nectar Teaspoon Weak lingual  manipulation;Reduced posterior propulsion;Delayed oral transit;Piecemeal swallowing;Premature spillage Oral - Nectar Cup -- Oral - Nectar Straw -- Oral - Thin Teaspoon Weak lingual manipulation;Reduced posterior propulsion;Piecemeal swallowing;Delayed oral transit;Premature spillage Oral - Thin Cup -- Oral - Thin Straw Weak lingual manipulation;Reduced posterior propulsion;Piecemeal swallowing;Delayed oral transit;Decreased bolus cohesion;Premature spillage Oral - Puree Weak lingual manipulation;Reduced posterior propulsion;Delayed oral transit;Premature spillage Oral - Mech Soft -- Oral - Regular -- Oral - Multi-Consistency -- Oral - Pill -- Oral Phase - Comment --  CHL IP PHARYNGEAL PHASE 08/28/2019 Pharyngeal Phase Impaired Pharyngeal- Pudding Teaspoon -- Pharyngeal -- Pharyngeal- Pudding Cup -- Pharyngeal -- Pharyngeal- Honey Teaspoon -- Pharyngeal -- Pharyngeal- Honey Cup -- Pharyngeal -- Pharyngeal- Nectar Teaspoon Reduced epiglottic inversion;Reduced anterior laryngeal mobility;Reduced laryngeal elevation;Reduced airway/laryngeal closure;Penetration/Aspiration during swallow;Moderate aspiration;Pharyngeal residue - valleculae;Pharyngeal residue - pyriform;Pharyngeal residue - cp segment;Delayed swallow initiation-pyriform sinuses Pharyngeal Material enters airway, passes BELOW  cords without attempt by patient to eject out (silent aspiration) Pharyngeal- Nectar Cup -- Pharyngeal -- Pharyngeal- Nectar Straw -- Pharyngeal -- Pharyngeal- Thin Teaspoon Delayed swallow initiation-pyriform sinuses;Reduced epiglottic inversion;Reduced anterior laryngeal mobility;Reduced laryngeal elevation;Reduced airway/laryngeal closure;Penetration/Aspiration during swallow;Penetration/Apiration after swallow;Moderate aspiration;Pharyngeal residue - valleculae;Pharyngeal residue - pyriform;Pharyngeal residue - cp segment Pharyngeal Material enters airway, remains ABOVE vocal cords and not ejected out Pharyngeal- Thin Cup Delayed  swallow initiation-pyriform sinuses;Reduced epiglottic inversion;Reduced anterior laryngeal mobility;Reduced laryngeal elevation;Reduced airway/laryngeal closure;Penetration/Aspiration during swallow;Moderate aspiration;Pharyngeal residue - valleculae;Pharyngeal residue - pyriform;Pharyngeal residue - cp segment Pharyngeal Material enters airway, passes BELOW cords without attempt by patient to eject out (silent aspiration) Pharyngeal- Thin Straw Delayed swallow initiation-pyriform sinuses;Reduced epiglottic inversion;Reduced anterior laryngeal mobility;Reduced laryngeal elevation;Reduced airway/laryngeal closure;Penetration/Aspiration during swallow;Penetration/Apiration after swallow;Moderate aspiration;Pharyngeal residue - valleculae;Pharyngeal residue - pyriform;Pharyngeal residue - cp segment Pharyngeal Material enters airway, passes BELOW cords and not ejected out despite cough attempt by patient Pharyngeal- Puree Delayed swallow initiation-vallecula;Delayed swallow initiation-pyriform sinuses;Reduced pharyngeal peristalsis;Reduced epiglottic inversion;Reduced anterior laryngeal mobility;Reduced laryngeal elevation;Reduced airway/laryngeal closure;Pharyngeal residue - valleculae;Pharyngeal residue - pyriform;Pharyngeal residue - cp segment Pharyngeal Material does not enter airway Pharyngeal- Mechanical Soft -- Pharyngeal -- Pharyngeal- Regular -- Pharyngeal -- Pharyngeal- Multi-consistency -- Pharyngeal -- Pharyngeal- Pill -- Pharyngeal -- Pharyngeal Comment recommend neruology consult to assess for neuromuscular deficits  CHL IP CERVICAL ESOPHAGEAL PHASE 08/28/2019 Cervical Esophageal Phase Impaired Pudding Teaspoon -- Pudding Cup -- Honey Teaspoon -- Honey Cup -- Nectar Teaspoon Reduced cricopharyngeal relaxation Nectar Cup -- Nectar Straw -- Thin Teaspoon Reduced cricopharyngeal relaxation Thin Cup Reduced cricopharyngeal relaxation Thin Straw Reduced cricopharyngeal relaxation Puree Reduced cricopharyngeal  relaxation Mechanical Soft -- Regular -- Multi-consistency -- Pill -- Cervical Esophageal Comment recommend ENT consult to assess for physiological deficits Happi Overton 08/28/2019, 12:24 PM              DG ESOPHAGUS W SINGLE CM (SOL OR THIN BA)  Result Date: 08/26/2019 CLINICAL DATA:  Dysphagia EXAM: ESOPHOGRAM/BARIUM SWALLOW TECHNIQUE: Single contrast examination was performed using  thin barium. FLUOROSCOPY TIME:  Fluoroscopy Time:  48 seconds Radiation Exposure Index (if provided by the fluoroscopic device): 14 COMPARISON:  None. FINDINGS: The patient is unable to stand. Single-contrast esophagram attempted with the patient approximately 40 degrees upright. With 2 small swallows, there was immediate aspiration into the tracheobronchial tree. Small hiatal hernia also noted. At this point the procedure was stopped. Images obtained for documentation. IMPRESSION: Positive exam for immediate aspiration into the tracheobronchial tree. Small hiatal hernia These results were called by telephone at the time of interpretation on 08/26/2019 at 1:00pm to provider Loletha Grayer , who verbally acknowledged these results. Electronically Signed   By: Jerilynn Mages.  Shick M.D.   On: 08/26/2019 13:20   US THORACENTESIS ASP PLEURAL SPACE W/IMG GUIDE  Result Date: 09/05/2019 INDICATION: 86 year old with multiple medical problems including pleural effusions. Request for thoracentesis. EXAM: ULTRASOUND GUIDED RIGHT THORACENTESIS MEDICATIONS: None. COMPLICATIONS: None immediate. PROCEDURE: An ultrasound guided thoracentesis was thoroughly discussed with the patient and questions answered. The benefits, risks, alternatives and complications were also discussed. The patient understands and wishes to proceed with the procedure. Written consent was obtained. Ultrasound was performed to localize and mark an adequate pocket of fluid in the right chest. The area was then prepped and draped in the normal sterile fashion. 1% Lidocaine was used  for local anesthesia. Under ultrasound guidance a 6 Fr Safe-T-Centesis catheter was introduced. Thoracentesis was performed. The catheter was removed and a dressing applied. FINDINGS: A total of approximately 1.1 L  of yellow fluid was removed. Samples were sent to the laboratory as requested by the clinical team. IMPRESSION: Successful ultrasound guided right thoracentesis yielding 1.1 L of pleural fluid. Electronically Signed   By: Markus Daft M.D.   On: 09/05/2019 15:58       Subjective: Patient has no complaints.  Still swollen, but ready for rehab.  No fever, confusion, headache, chest pain, dyspnea.  Discharge Exam: Vitals:   09/11/19 2127 09/12/19 0326  BP: (!) 117/56 (!) 103/55  Pulse: (!) 57 (!) 58  Resp: 18 16  Temp: 98.6 F (37 C) 98.6 F (37 C)  SpO2: 97% 98%   Vitals:   09/11/19 0518 09/11/19 1257 09/11/19 2127 09/12/19 0326  BP: (!) 142/71 121/70 (!) 117/56 (!) 103/55  Pulse: 71 66 (!) 57 (!) 58  Resp: _0 Temp: (!) 97.5 F (36.4 C)  98.6 F (37 C) 98.6 F (37 C)  TempSrc: Oral  Oral Oral  SpO2: 96% 97% 97% 98%  Weight:      Height:        General: Pt is alert, awake, not in acute distress Cardiovascular: RRR, nl S1-S2, no murmurs appreciated.   1+ LE edema and 2+ arm edema.   Respiratory: Normal respiratory rate and rhythm.  CTAB without rales or wheezes. Abdominal: Abdomen soft and non-tender.  No distension or HSM.   Neuro/Psych: Strength symmetric in upper and lower extremities but severe proximal geneeralized weakness.  Judgment and insight appear normal.   The results of significant diagnostics from this hospitalization (including imaging, microbiology, ancillary and laboratory) are listed below for reference.     Microbiology: Recent Results (from the past 240 hour(s))  Body fluid culture     Status: None   Collection Time: 09/05/19  3:15 PM   Specimen: PATH Cytology Pleural fluid  Result Value Ref Range Status   Specimen Description    Final    PLEURAL Performed at [facility], [address] [place], [place] [address]    Special Requests   Final    NONE Performed at [facility], [place] [place], [place] [address]    Gram Stain   Final    FEW WBC PRESENT, PREDOMINANTLY MONONUCLEAR NO ORGANISMS SEEN    Culture   Final    NO GROWTH 3 DAYS Performed at [facility] [address] [place], [place] [address]    Report Status 09/09/2019 FINAL  Final  Acid Fast Smear (AFB)     Status: None   Collection Time: 09/05/19  3:15 PM   Specimen: PATH Cytology Pleural fluid  Result Value Ref Range Status   AFB Specimen Processing Concentration  Final   Acid Fast Smear Negative  Final    Comment: (NOTE) Performed At: [facility] [address] [place], [place] [id number] Rush Farmer MD [id number]    Source (AFB) PLEURAL  Final    Comment: Performed at [facility], [place], [place] [address]  SARS CORONAVIRUS 2 (TAT 6-24 HRS) Nasopharyngeal Nasopharyngeal Swab     Status: None   Collection Time: 09/11/19  6:04 PM   Specimen: Nasopharyngeal Swab  Result Value Ref Range Status   SARS Coronavirus 2 NEGATIVE NEGATIVE Final    Comment: (NOTE) SARS-CoV-2 target nucleic acids are NOT DETECTED.  The SARS-CoV-2 RNA is generally detectable in upper and lower respiratory specimens during the acute phase of infection. Negative results do not preclude SARS-CoV-2 infection, do not  rule out co-infections with other pathogens, and should not be used as the sole basis for treatment or other patient management decisions. Negative results must be combined with clinical observations, patient history, and epidemiological information. The expected result is Negative.  Fact Sheet for Patients: SugarRoll.be  Fact Sheet for Healthcare Providers: https://www.woods-mathews.com/  This test is not yet approved or cleared  by the [place] FDA and  has been authorized for detection and/or diagnosis of SARS-CoV-2 by FDA under an Emergency Use Authorization (EUA). This EUA will remain  in effect (meaning this test can be used) for the duration of the COVID-19 declaration under Se ction 564(b)(1) of the Act, 21 U.S.C. section 360bbb-3(b)(1), unless the authorization is terminated or revoked sooner.  Performed at [facility], [place] [address] [place], [place] [address]      Labs: BNP (last 3 results) Recent Labs    08/24/19 2037  BNP 948.5*   Basic Metabolic Panel: Recent Labs  Lab 09/06/19 0452 09/07/19 0627 09/08/19 0400 09/09/19 0443 09/10/19 0331 09/11/19 0430 09/12/19 0544  NA 137   < > 135 135 136 137 137  K 5.4*   < > 4.6 4.6 4.6 4.2 3.8  CL 110   < > 108 108 109 108 104  CO2 21*   < > 21* 23 21* 21* 24  GLUCOSE 127*   < > 135* 124* 168* 201* 164*  BUN 19   < > 24* 28* 33* 41* 46*  CREATININE 0.70   < > 0.78 0.75 0.91 1.16 0.92  CALCIUM 7.5*   < > 7.6* 7.5* 7.3* 7.5* 7.2*  MG 1.9  --  1.9  --   --   --   --   PHOS 3.5  --   --   --   --   --   --    < > = values in this interval not displayed.   Liver Function Tests: Recent Labs  Lab 09/10/19 0331  AST 27  ALT 11  ALKPHOS 58  BILITOT 0.9  PROT 4.3*  ALBUMIN 1.5*   No results for input(s): LIPASE, AMYLASE in the last 168 hours. No results for input(s): AMMONIA in the last 168 hours. CBC: Recent Labs  Lab 09/08/19 0400 09/09/19 0443 09/10/19 1106 09/11/19 0430 09/12/19 0544  WBC 2.8* 14.5* 10.0 6.5 9.0  NEUTROABS 2.1  --   --   --   --   HGB 9.2* 9.0* 8.0* 8.5* 8.5*  HCT 28.1* 28.0* 25.4* 25.9* 26.0*  MCV 86.5 88.9 89.1 86.0 88.1  PLT 150 164 128* 106* 108*   Cardiac Enzymes: No results for input(s): CKTOTAL, CKMB, CKMBINDEX, TROPONINI in the last 168 hours. BNP: Invalid input(s): POCBNP CBG: No results for input(s): GLUCAP in the last 168 hours. D-Dimer No results for input(s): DDIMER in the last  72 hours. Hgb A1c No results for input(s): HGBA1C in the last 72 hours. Lipid Profile No results for input(s): CHOL, HDL, LDLCALC, TRIG, CHOLHDL, LDLDIRECT in the last 72 hours. Thyroid function studies No results for input(s): TSH, T4TOTAL, T3FREE, THYROIDAB in the last 72 hours.  Invalid input(s): FREET3 Anemia work up No results for input(s): VITAMINB12, FOLATE, FERRITIN, TIBC, IRON, RETICCTPCT in the last 72 hours. Urinalysis    Component Value Date/Time   COLORURINE YELLOW (A) 08/24/2019 2037   APPEARANCEUR HAZY (A) 08/24/2019 2037   LABSPEC 1.015 08/24/2019 2037   PHURINE 5.0 08/24/2019 2037   GLUCOSEU 50 (A) 08/24/2019 2037  [place] NEGATIVE 08/24/2019 2037   [place] NEGATIVE 08/24/2019 2037   KETONESUR 5 (A) 08/24/2019 2037   PROTEINUR NEGATIVE 08/24/2019 2037   NITRITE NEGATIVE 08/24/2019 2037   LEUKOCYTESUR NEGATIVE 08/24/2019 2037   Sepsis Labs Invalid input(s): PROCALCITONIN,  WBC,  LACTICIDVEN Microbiology Recent Results (from the past 240 hour(s))  Body fluid culture     Status: None   Collection Time: 09/05/19  3:15 PM   Specimen: PATH Cytology Pleural fluid  Result Value Ref Range Status   Specimen Description   Final    PLEURAL Performed at [facility], [address], [place], [place] [address]    Special Requests   Final    NONE Performed at [facility], [address] [place], [place] [address]    Gram Stain   Final    FEW WBC PRESENT, PREDOMINANTLY MONONUCLEAR NO ORGANISMS SEEN    Culture   Final    NO GROWTH 3 DAYS Performed at [facility], [place] [address] [place], [place] [address]    Report Status 09/09/2019 FINAL  Final  Acid Fast Smear (AFB)     Status: None   Collection Time: 09/05/19  3:15 PM   Specimen: PATH Cytology Pleural fluid  Result Value Ref Range Status   AFB Specimen Processing Concentration  Final   Acid Fast Smear Negative  Final    Comment: (NOTE) Performed At: [facility] [address] [place], [place] [id number] Rush Farmer MD [id number]    Source (AFB) PLEURAL  Final    Comment: Performed at [facility], [place], [place] [address]  SARS CORONAVIRUS 2 (TAT 6-24 HRS) Nasopharyngeal Nasopharyngeal Swab     Status: None   Collection Time: 09/11/19  6:04 PM   Specimen: Nasopharyngeal Swab  Result Value Ref Range Status   SARS Coronavirus 2 NEGATIVE NEGATIVE Final    Comment: (NOTE) SARS-CoV-2 target nucleic acids are NOT DETECTED.  The SARS-CoV-2 RNA is generally detectable in upper and lower respiratory specimens during the acute phase of infection. Negative results do not preclude SARS-CoV-2 infection, do not rule out co-infections with other pathogens, and should not be used as the sole basis for treatment or other patient management decisions. Negative results must be combined with clinical observations, patient history, and epidemiological information. The expected result is Negative.  Fact Sheet for Patients: SugarRoll.be  Fact Sheet for Healthcare Providers: https://www.woods-mathews.com/  This test is not yet approved or cleared by the [place] FDA and  has been authorized for detection and/or diagnosis of SARS-CoV-2 by FDA under an Emergency Use Authorization (EUA). This EUA will remain  in effect (meaning this test can be used) for the duration of the COVID-19 declaration under Se ction 564(b)(1) of the Act, 21 U.S.C. section 360bbb-3(b)(1), unless the authorization is terminated or revoked sooner.  Performed at [facility], [place] [address] [place], [place] [address]      Time coordinating discharge: 45 minutes The [place] controlled substances registry was reviewed for this patient      SIGNED:   Edwin Dada, MD  [facility] 09/12/2019, 10:57 AM

## 2019-09-12 NOTE — TOC Progression Note (Addendum)
Transition of Care [facility]) - Progression Note    Patient Details  Name: Albert [name] MRN: [id number] Date of Birth: 1933/12/17  Transition of Care [facility]) CM/SW [place], RN Phone Number: 09/12/2019, 10:10 AM  Clinical Narrative:    Damaris Schooner to Juliann Pulse @ [facility] states patient is clear to transfer today. Room 702. Report can be called to [facility] @ [phone]. RN and MD notified.   [facility] transportation notified.    Expected Discharge Plan: [place] Barriers to Discharge: Continued Medical Work up  Expected Discharge Plan and Services Expected Discharge Plan: [place] In-house Referral:  (Caregivers (family) that take turns in the home) Discharge Planning Services: CM Consult Post Acute Care Choice: [place]   Expected Discharge Date: 09/12/19                                     Social Determinants of Health (SDOH) Interventions    Readmission Risk Interventions No flowsheet data found.

## 2019-09-14 LAB — MISC LABCORP TEST (SEND OUT): Labcorp test code: 520080

## 2019-09-22 ENCOUNTER — Encounter: Payer: Medicare Other | Admitting: Surgery

## 2019-09-22 ENCOUNTER — Encounter: Payer: Self-pay | Admitting: Surgery

## 2019-09-22 LAB — SURGICAL PATHOLOGY

## 2019-10-02 ENCOUNTER — Other Ambulatory Visit: Payer: Self-pay

## 2019-10-02 ENCOUNTER — Emergency Department (HOSPITAL_COMMUNITY)
Admission: EM | Admit: 2019-10-02 | Discharge: 2019-10-06 | Disposition: E | Payer: Medicare Other | Attending: Emergency Medicine | Admitting: Emergency Medicine

## 2019-10-02 ENCOUNTER — Encounter (HOSPITAL_COMMUNITY): Payer: Self-pay | Admitting: Emergency Medicine

## 2019-10-02 ENCOUNTER — Ambulatory Visit (HOSPITAL_COMMUNITY): Admit: 2019-10-02 | Payer: Medicare Other

## 2019-10-02 DIAGNOSIS — R4182 Altered mental status, unspecified: Secondary | ICD-10-CM | POA: Diagnosis present

## 2019-10-02 DIAGNOSIS — L539 Erythematous condition, unspecified: Secondary | ICD-10-CM | POA: Insufficient documentation

## 2019-10-02 DIAGNOSIS — N1831 Chronic kidney disease, stage 3a: Secondary | ICD-10-CM | POA: Diagnosis not present

## 2019-10-02 DIAGNOSIS — I129 Hypertensive chronic kidney disease with stage 1 through stage 4 chronic kidney disease, or unspecified chronic kidney disease: Secondary | ICD-10-CM | POA: Insufficient documentation

## 2019-10-02 DIAGNOSIS — R627 Adult failure to thrive: Secondary | ICD-10-CM | POA: Diagnosis not present

## 2019-10-02 DIAGNOSIS — Z79899 Other long term (current) drug therapy: Secondary | ICD-10-CM | POA: Diagnosis not present

## 2019-10-02 DIAGNOSIS — Z515 Encounter for palliative care: Secondary | ICD-10-CM | POA: Diagnosis not present

## 2019-10-02 DIAGNOSIS — R0603 Acute respiratory distress: Secondary | ICD-10-CM | POA: Diagnosis not present

## 2019-10-02 DIAGNOSIS — Z7189 Other specified counseling: Secondary | ICD-10-CM | POA: Diagnosis not present

## 2019-10-02 DIAGNOSIS — J9601 Acute respiratory failure with hypoxia: Secondary | ICD-10-CM | POA: Diagnosis not present

## 2019-10-02 DIAGNOSIS — Z20822 Contact with and (suspected) exposure to covid-19: Secondary | ICD-10-CM | POA: Diagnosis not present

## 2019-10-02 LAB — RESPIRATORY PANEL BY RT PCR (FLU A&B, COVID)
Influenza A by PCR: NEGATIVE
Influenza B by PCR: NEGATIVE
SARS Coronavirus 2 by RT PCR: NEGATIVE

## 2019-10-02 MED ORDER — ONDANSETRON HCL 4 MG/2ML IJ SOLN: 4.0000 mg | Freq: Four times a day (QID) | INTRAMUSCULAR | Status: DC | PRN

## 2019-10-02 MED ORDER — GLYCOPYRROLATE 0.2 MG/ML IJ SOLN: 0.2000 mg | INTRAMUSCULAR | Status: DC | PRN

## 2019-10-02 MED ORDER — LORAZEPAM 2 MG/ML IJ SOLN: 1.0000 mg | INTRAMUSCULAR | Status: DC | PRN

## 2019-10-02 MED ORDER — LORAZEPAM 1 MG PO TABS: 1.0000 mg | ORAL_TABLET | ORAL | Status: DC | PRN

## 2019-10-02 MED ORDER — ONDANSETRON 4 MG PO TBDP: 4.0000 mg | ORAL_TABLET | Freq: Four times a day (QID) | ORAL | Status: DC | PRN

## 2019-10-02 MED ORDER — MORPHINE SULFATE (PF) 2 MG/ML IV SOLN: 1.0000 mg | INTRAVENOUS | Status: DC | PRN

## 2019-10-02 MED ORDER — BIOTENE DRY MOUTH MT LIQD: 15.0000 mL | OROMUCOSAL | Status: DC | PRN

## 2019-10-02 MED ORDER — ACETAMINOPHEN 650 MG RE SUPP: 650.0000 mg | Freq: Four times a day (QID) | RECTAL | Status: DC | PRN

## 2019-10-02 MED ORDER — ACETAMINOPHEN 325 MG PO TABS: 650.0000 mg | ORAL_TABLET | Freq: Four times a day (QID) | ORAL | Status: DC | PRN

## 2019-10-02 MED ORDER — POLYVINYL ALCOHOL 1.4 % OP SOLN: 1.0000 [drp] | Freq: Four times a day (QID) | OPHTHALMIC | Status: DC | PRN

## 2019-10-02 MED ORDER — LORAZEPAM 2 MG/ML PO CONC: 1.0000 mg | ORAL | Status: DC | PRN

## 2019-10-02 MED ORDER — GLYCOPYRROLATE 1 MG PO TABS
1.0000 mg | ORAL_TABLET | ORAL | Status: DC | PRN
  Filled 2019-10-02: qty 1

## 2019-10-04 DIAGNOSIS — J9601 Acute respiratory failure with hypoxia: Secondary | ICD-10-CM | POA: Insufficient documentation

## 2019-10-04 DIAGNOSIS — Z515 Encounter for palliative care: Secondary | ICD-10-CM | POA: Insufficient documentation

## 2019-10-04 DIAGNOSIS — R627 Adult failure to thrive: Secondary | ICD-10-CM | POA: Insufficient documentation

## 2019-10-06 NOTE — ED Provider Notes (Addendum)
[facility] EMERGENCY DEPARTMENT Provider Note   CSN: [id number] Arrival date & time: October 17, 2019  1156     History Chief Complaint  Patient presents with  . Altered Mental Status    Albert [name] is a 86 y.o. male.  HPI  Level 5 caveat secondary to altered mental status 86 year old male who presents today from [facility] with altered mental status.  I spoke with his power of attorney.  She states that he he was last normal on Friday.  She speaks to him daily.  She spoke to him on Saturday and he seemed somewhat confused.  She did not speak with her yesterday.  She was called by [name] and told that he was altered today and being sent to the emergency department for further evaluation.  Nursing is at bedside.  The have not been able to obtain any history from the patient.  The report that he is alert and oriented x2.  Facility reported blood in his stool with blood pressure 70/50.  His power of attorney reports that he has not been well as well since he had Covid last year. Review of recent discharge summary of September 12, 2019 revealed that he was hospitalized here with sepsis due to aspiration pneumonia and see difficile colitis.  During that hospitalization he was fluid overloaded and required diuresis.  He is generally weak and is thought to have an autoimmune source of his generalized weakness.  He has a history of hypertension, anemia, esophageal ulcer, protein calorie malnutrition nutrition status post PEG tube.  He has been nonambulatory and often confused.  He does come with a MOST form.  It was filled out to have full care given. I discussed the patient's care with his cousin and power of attorney Gerald Dexter.  She does not wish him to have aggressive care.  She feels that he would not wish to be intubated, on any machines, antibiotics, vasopressors. In discussion with Ms. Easterwood, decision to make comfort care initiated.     Past Medical History:   Diagnosis Date  . Actinic keratosis 04/23/2008   L elbow distal tricep (bx proven)  . Basal cell carcinoma 06/17/2006   L preauricular   . Basal cell carcinoma 05/26/2012   L ant neck   . Basal cell carcinoma 11/02/2012   Glabella - excision   . Basal cell carcinoma 08/15/2014   L med ankle   . Basal cell carcinoma 02/23/2017   R lat infrapectoral - excision  . Basal cell carcinoma 12/08/2017   L mid to distal lat volar forearm   . Hypertension   . Squamous cell carcinoma of skin 02/14/2014   R lat low back - ED&C  . Squamous cell carcinoma of skin 10/02/2014   R prox lat bicep - ED&C    Patient Active Problem List   Diagnosis Date Noted  . Autoimmune disease ([place])   . Palliative care by specialist   . Advanced care planning/counseling discussion   . Goals of care, counseling/discussion   . Hypomagnesemia   . Hypokalemia   . Aspiration pneumonia of right lower lobe due to gastric secretions ([place])   . Dysphagia   . C. difficile colitis   . Ulcer of esophagus without bleeding   . Lobar pneumonia ([place])   . Wide-complex tachycardia ([place])   . Sepsis ([place]) 08/24/2019  . Lactic acidosis 08/24/2019  . Anemia 08/24/2019  . Elevated brain natriuretic peptide (BNP) level 08/24/2019  . Protein-calorie malnutrition, severe  08/12/2019  . GIB (gastrointestinal bleeding) 08/10/2019  . GI bleed 08/09/2019  . Acute GI bleeding 08/04/2019  . Symptomatic anemia 08/04/2019  . Coagulopathy ([place]) 08/04/2019  . Metabolic acidosis 31/49/7026  . SIRS (systemic inflammatory response syndrome) ([place]) 08/04/2019  . UGI bleed 08/04/2019  . Dehydration 11/24/2018  . CKD (chronic kidney disease), stage III 11/24/2018  . Generalized weakness 11/22/2018  . Acute renal failure superimposed on stage 3a chronic kidney disease ([place])   . Elevated troponin   . Hyperlipidemia 06/15/2014  . Benign essential hypertension 06/15/2013    Past Surgical History:  Procedure Laterality Date  . COLONOSCOPY  N/A 08/05/2019   Procedure: COLONOSCOPY;  Surgeon: Toledo, Benay Pike, MD;  Location: [facility] ENDOSCOPY;  Service: Gastroenterology;  Laterality: N/A;  . ESOPHAGEAL DILATION N/A 08/30/2019   Procedure: ESOPHAGEAL DILATION;  Surgeon: Margaretha Sheffield, MD;  Location: [facility] ORS;  Service: ENT;  Laterality: N/A;  . ESOPHAGOGASTRODUODENOSCOPY N/A 08/05/2019   Procedure: ESOPHAGOGASTRODUODENOSCOPY (EGD);  Surgeon: Toledo, Benay Pike, MD;  Location: [facility] ENDOSCOPY;  Service: Gastroenterology;  Laterality: N/A;  . ESOPHAGOSCOPY N/A 08/30/2019   Procedure: ESOPHAGOSCOPY;  Surgeon: Margaretha Sheffield, MD;  Location: [facility] ORS;  Service: ENT;  Laterality: N/A;  . GASTROSTOMY N/A 09/08/2019   Procedure: INSERTION OF GASTROSTOMY TUBE;  Surgeon: Olean Ree, MD;  Location: [facility] ORS;  Service: General;  Laterality: N/A;  . IVC FILTER INSERTION N/A 09/04/2019   Procedure: IVC FILTER INSERTION;  Surgeon: Algernon Huxley, MD;  Location: [facility];  Service: Cardiovascular;  Laterality: N/A;  . MUSCLE BIOPSY Right 09/08/2019   Procedure: MUSCLE BIOPSY;  Surgeon: Olean Ree, MD;  Location: [facility] ORS;  Service: General;  Laterality: Right;       History reviewed. No pertinent family history.  Social History   Tobacco Use  . Smoking status: Never Smoker  . Smokeless tobacco: Never Used  Vaping Use  . Vaping Use: Never used  Substance Use Topics  . Alcohol use: Not Currently  . Drug use: Never    Home Medications Prior to Admission medications   Medication Sig Start Date End Date Taking? Authorizing Provider  gabapentin (NEURONTIN) 300 MG capsule Take 300 mg by mouth at bedtime.   Yes [provider]  HYDROcodone-acetaminophen (NORCO/VICODIN) 5-325 MG tablet Take 1 tablet by mouth 2 (two) times daily.   Yes [provider]  metoprolol tartrate (LOPRESSOR) 25 MG tablet Take 0.5 tablets (12.5 mg total) by mouth 2 (two) times daily. 09/12/19  Yes Danford, Suann Larry, MD  Nutritional Supplements  (FEEDING SUPPLEMENT, OSMOLITE 1.5 CAL,) LIQD Place 1,000 mLs into feeding tube continuous. 09/12/19  Yes Danford, Suann Larry, MD  ondansetron (ZOFRAN) 4 MG tablet Take 1 tablet (4 mg total) by mouth every 6 (six) hours as needed for nausea. 08/08/19  Yes Georgette Shell, MD  pantoprazole (PROTONIX) 40 MG tablet Take 1 tablet (40 mg total) by mouth daily. 08/09/19  Yes Georgette Shell, MD  predniSONE (DELTASONE) 20 MG tablet Take 1 tablet (20 mg total) by mouth daily with breakfast. 09/13/19  Yes Danford, Suann Larry, MD  Amino Acids-Protein Hydrolys (FEEDING SUPPLEMENT, PRO-STAT SUGAR FREE 64,) LIQD Place 30 mLs into feeding tube daily.    [provider]  furosemide (LASIX) 40 MG tablet Take 1 tablet (40 mg total) by mouth 2 (two) times daily. Patient not taking: Reported on October 18, 2019 09/12/19 09/11/20  Edwin Dada, MD  Nutritional Supplements (FEEDING SUPPLEMENT, PROSOURCE TF,) liquid Place 45 mLs into feeding tube daily.  Patient not taking: Reported on 10-07-19 09/13/19   Edwin Dada, MD  OVER THE COUNTER MEDICATION Give 1 capsule by tube daily. decubi vite (multivi-folic acid-zinc-vit c) 400-50-500 mcg-mg-mg capsule    [provider]    Allergies    Penicillins  Review of Systems   Review of Systems  Unable to perform ROS: Acuity of condition    Physical Exam Updated Vital Signs BP (!) 80/52 (BP Location: Right Arm)   Pulse (!) 115   Temp 98.9 F (37.2 C) (Axillary)   Resp 16   SpO2 (!) 85%   Physical Exam Vitals and nursing note reviewed.  Constitutional:      General: He is not in acute distress.    Appearance: He is ill-appearing.     Comments: Cachectic  HENT:     Head: Normocephalic.     Right Ear: External ear normal.     Left Ear: External ear normal.     Nose: Nose normal.     Mouth/Throat:     Mouth: Mucous membranes are dry.  Eyes:     Pupils: Pupils are equal, round, and reactive to light.  Cardiovascular:      Comments: Tachycardic and hypotensive Pulmonary:     Effort: Respiratory distress present.     Comments: Increased respiratory rate and work of breathing Abdominal:     Comments: Feeding tube in place abdomen concave but soft  Musculoskeletal:     Cervical back: Normal range of motion.     Comments: Dressing in place bilateral heels  Skin:    General: Skin is warm and dry.     Capillary Refill: Capillary refill takes less than 2 seconds.     Comments: Erythematous area to the diffusely to back  Neurological:     General: No focal deficit present.     Comments: Patient is able to tell me his name but not place No focal deficits are appreciated Patient is generally very weak and not able to move extremities which is thought to be due to generalized weakness     ED Results / Procedures / Treatments   Labs (all labs ordered are listed, but only abnormal results are displayed) Labs Reviewed - No data to display  EKG None  Radiology No results found.  Procedures Procedures (including critical care time)  Medications Ordered in ED Medications - No data to display  ED Course  I have reviewed the triage vital signs and the nursing notes.  Pertinent labs & imaging results that were available during my care of the patient were reviewed by me and considered in my medical decision making (see chart for details).    MDM Rules/Calculators/A&P                          Patient appears acutely and chronically ill.  Decision made with power of attorney and next of kin Gerald Dexter make patient for care.  Palliative care is being consulted.  Patient is nonrebreather and sats are in the 80s.  Blood pressure is 70/50.  No obvious bleeding is noted. Discussed with palliative care and they will see  Covid test negative Patient being transferred to [facility] Patient passed here in department with time of death at 72  Final Clinical Impression(s) / ED Diagnoses Final diagnoses:    Encounter for palliative care    Rx / DC Orders ED Discharge Orders    None  Pattricia Boss, MD 2019/10/04 1432    Pattricia Boss, MD 2019-10-04 [phone]

## 2019-10-06 NOTE — Progress Notes (Signed)
Manufacturing engineer Documentation  Liaison received a referral for pt to dc to [facility] for end of life care.   Writer spoke with pt's relative, Carlyon Shadow, who stated that [facility] [place] for they live in [place] but would take a bed at the first available.   Currently, neither [facility] nor [facility] have bed availability. Liaison will keep TOC and family updated with any changes.   Please do not hesitate to outreach with any questions and thank you for the referral.   Freddie Breech, RN  [facility] Liaison [phone]

## 2019-10-06 NOTE — ED Triage Notes (Signed)
Pt here from [facility] for AMS since yesterday, normally A/O x 2. Pt unresponsive today. Facility noticed blood in stool. BP 70/50.

## 2019-10-06 NOTE — ED Notes (Signed)
NRB and [place] removed per pt's request.  MD is aware.  Palliative care at bedside.

## 2019-10-06 NOTE — Consult Note (Signed)
Consultation Note Date: 10/14/2019   Patient Name: Albert [name]  DOB: Dec 16, 1933  MRN: [id number]  Age / Sex: 86 y.o., male  PCP: [name], MD Referring Physician: Pattricia Boss, MD  Reason for Consultation: Establishing goals of care and Terminal Care   Patient from: [name]  HPI/Patient Profile: 86 y.o. male  with past medical history of CKD stage 3, HTN, and hyperlipidemia presenting to [facility] emergency department on 10-14-2019 with AMS and blood in his stool.  Recent hospitalization at [facility] 8/4-8/9 for GI bleed. Recent hospitalization at [facility] 8/19-9/7 for sepsis secondary to aspiration pneumonia and C diff colitis. Hospital course was complicated by persistent generalized weakness and dysphagia; ultimately requiring PEG tube placement.   Primary decision maker: [name] (cousin and [phone]   Clinical Assessment and Goals of Care: I have reviewed medical records including EPIC notes, labs and imaging, and examined the patient at bedside. Patient is on room air; shortly before my arrival to the ED, he has just requested to have his NRB mask removed. O2 saturation currently in the 70's. BP currently 70's/40's. Patient is awake but lethargic, he is able to tell me he is having pain.   I called [facility]CP[facility] Darlene to discuss diagnosis, prognosis, GOC, EOL wishes, disposition, and options.  I introduced Palliative Medicine as specialized medical care for people living with serious illness. It focuses on providing relief from the symptoms and stress of a serious illness.   Social history: Prior to admission 8/19, patient had been at [facility] for rehab. Prior to admission 8/4, he was at home, living independently, and enjoyed driving his car. He worked in [employer] most of his life. He valued being active. He lived at home alone; his wife died in 08-Apr-2012.    As far as functional and nutritional  status, there has been a significant and rapid decline over the past 2 months or so.   We discussed his current illness and what it means in the larger context of his ongoing co-morbidities.  Natural disease trajectory and expectations at EOL was discussed.  I attempted to elicit values and goals of care important to the patient. The difference between aggressive medical intervention and comfort care was considered in light of the patient's goals of care.   Discussed transitioning to comfort care in the hospital, and what that would look like--keeping him clean and dry, no labs, no artificial hydration or feeding, no antibiotics, minimizing of medications, comfort feeds, medication for pain and dyspnea.   Hospice and Palliative Care services outpatient were explained and offered. Carlyon Shadow is agreeable to residential hospice  Questions and concerns were addressed. The family was encouraged to call with questions or concerns.    SUMMARY OF RECOMMENDATIONS   - DNR/DNI - full comfort care - symptom management per EOL order set - transfer to [facility] (I have spoken with [facility] liaison), they have a bed available today  Code Status/Advance Care Planning:  DNR  Symptom Management:   Per end of life order set  Morphine  prn for pain or shortness of breath  Lorazepam (ATIVAN) prn for anxiety  Glycopyrrolate (ROBINUL) for excessive secretions  Ondansetron (ZOFRAN) prn for nausea  Palliative Prophylaxis:   Frequent Pain Assessment, Oral Care and Turn Reposition  Additional Recommendations (Limitations, Scope, Preferences):  Full Comfort Care  Psycho-social/Spiritual:   Emotional support provided   Prognosis:   Hours - Days  Discharge Planning: Hospice facility      Primary Diagnoses: Present on Admission: **None**   History reviewed. No pertinent family history. Scheduled Meds: Continuous Infusions: PRN Meds:.acetaminophen **OR** acetaminophen, antiseptic  oral rinse, glycopyrrolate **OR** glycopyrrolate **OR** glycopyrrolate, LORazepam **OR** LORazepam **OR** LORazepam, morphine injection, ondansetron **OR** ondansetron (ZOFRAN) IV, polyvinyl alcohol  Allergies  Allergen Reactions  . Penicillins     Per MAR   Review of Systems  Unable to perform ROS: Mental status change    Physical Exam Vitals reviewed.  Constitutional:      Appearance: He is ill-appearing.     Comments: Arouses easily to voice   HENT:     Head: Normocephalic and atraumatic.  Cardiovascular:     Rate and Rhythm: Tachycardia present.  Pulmonary:     Comments: Mildly labored breathing Neurological:     Mental Status: He is lethargic.     Vital Signs: BP (!) 66/53 (BP Location: Left Arm)   Pulse (!) 108   Temp 100.1 F (37.8 C) (Rectal)   Resp 16   SpO2 (!) 76%  Pain Scale: 0-10   Pain Score: Asleep   SpO2: SpO2: (!) 76 % O2 Device:SpO2: (!) 76 %  Palliative Assessment/Data: PPS 20%    Time In: 13:00 Time Out: 14:12 Time Total: 72 minutes Greater than 50%  of this time was spent counseling and coordinating care related to the above assessment and plan.  Signed by: Lavena Bullion, NP   Please contact Palliative Medicine Team phone at [phone] for questions and concerns.  For individual provider: See Shea Evans

## 2019-10-06 NOTE — ED Notes (Signed)
Dr. Jeanell Sparrow spoke with POA via phone. It has been decided with POA that pt will not undergo life saving measures at this time. Comfort measures will be utilized.

## 2019-10-06 NOTE — ED Notes (Signed)
This RN went in to check on pt. Pts radial pulses could not be felt and no lung or heart sounds could be heard. Dr. Jeanell Sparrow notified of findings.

## 2019-10-06 DEATH — deceased

## 2019-10-21 LAB — ACID FAST CULTURE WITH REFLEXED SENSITIVITIES (MYCOBACTERIA): Acid Fast Culture: NEGATIVE

## 2019-11-14 ENCOUNTER — Ambulatory Visit (INDEPENDENT_AMBULATORY_CARE_PROVIDER_SITE_OTHER): Payer: Self-pay | Admitting: Vascular Surgery

## 2021-05-04 IMAGING — RF DG ESOPHAGUS
5 series · 8 of 8 positions shown · non-contrast
Comparison: None.

CLINICAL DATA: Dysphagia

EXAM:
ESOPHOGRAM/BARIUM SWALLOW
TECHNIQUE: Single contrast examination was performed using  thin barium.
FLUOROSCOPY TIME:  Fluoroscopy Time:  48 seconds
Radiation Exposure Index (if provided by the fluoroscopic device):
14

[Series 1: fluoro_barium 2fps_bw · 0.17mm/px · 2 of 2 frames shown (1 of 4)]
[frame 1/2]
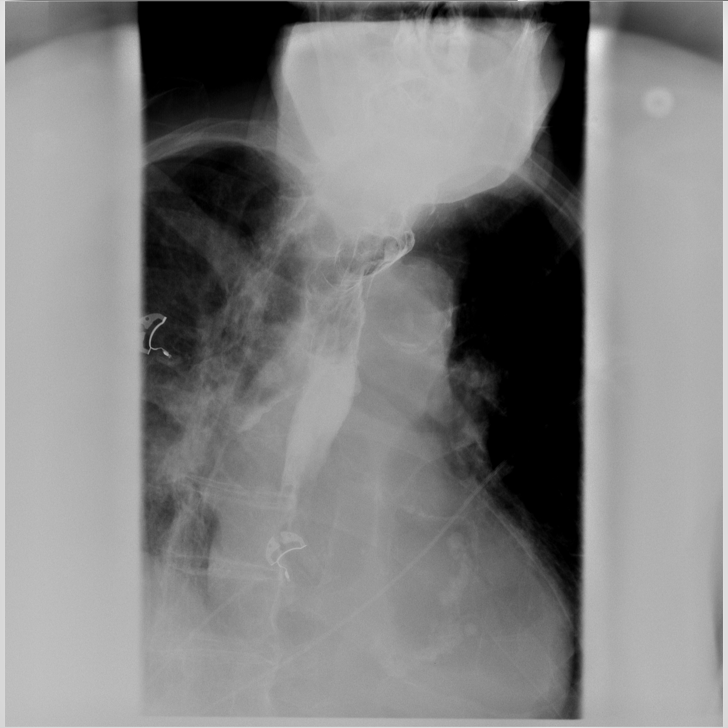
[frame 2/2]
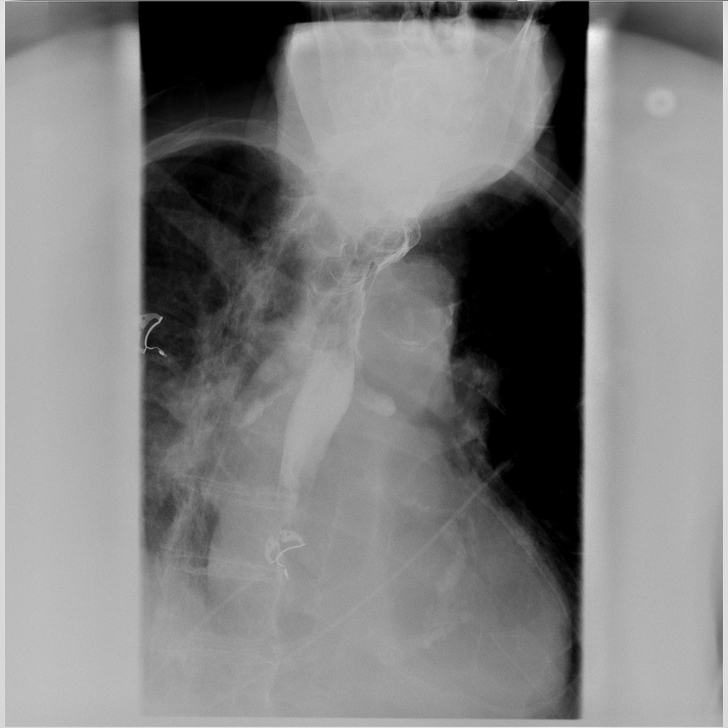

[Series 2: fluoro_barium 2fps_bw · 0.17mm/px · 1 of 1 slices shown (2 of 4)]
[im 1/1]
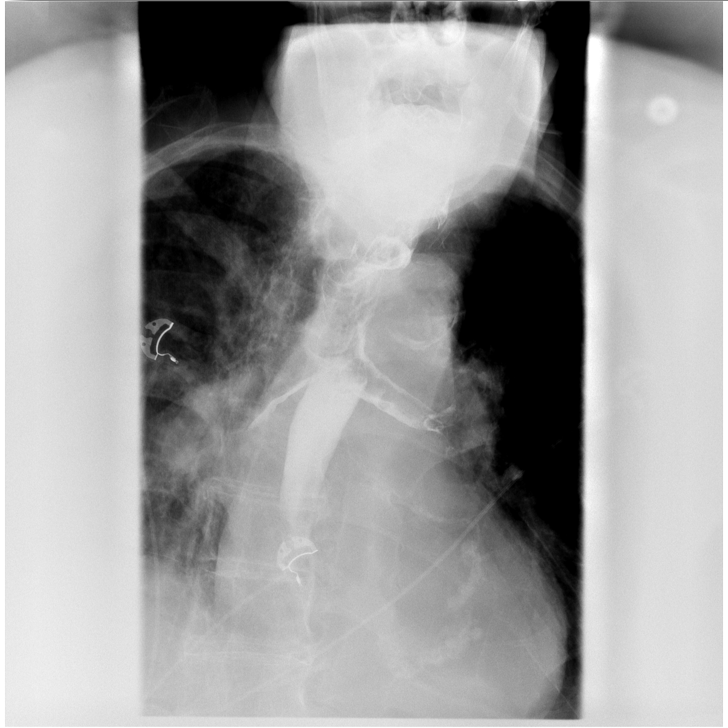

[Series 3: fluoro_barium 2fps_bw · 0.17mm/px · 2 of 2 frames shown (3 of 4)]
[frame 1/2]
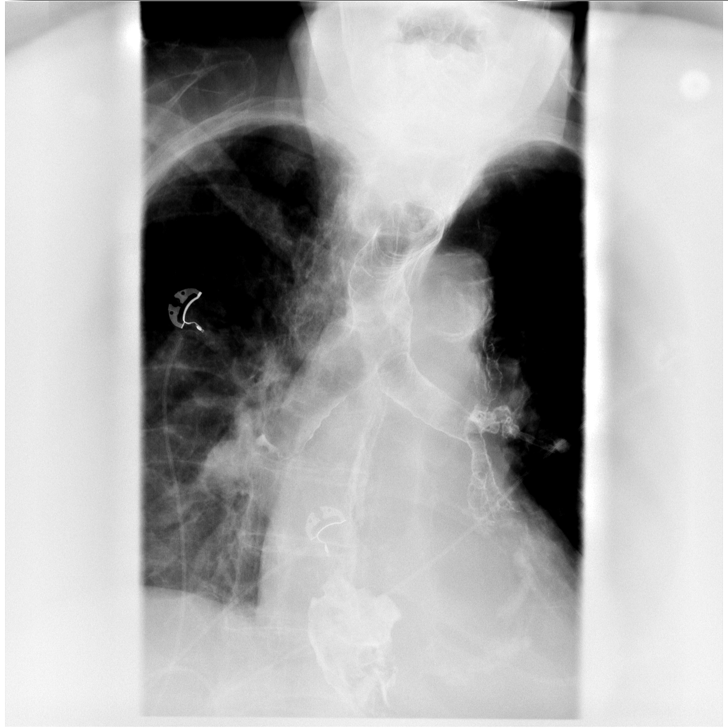
[frame 2/2]
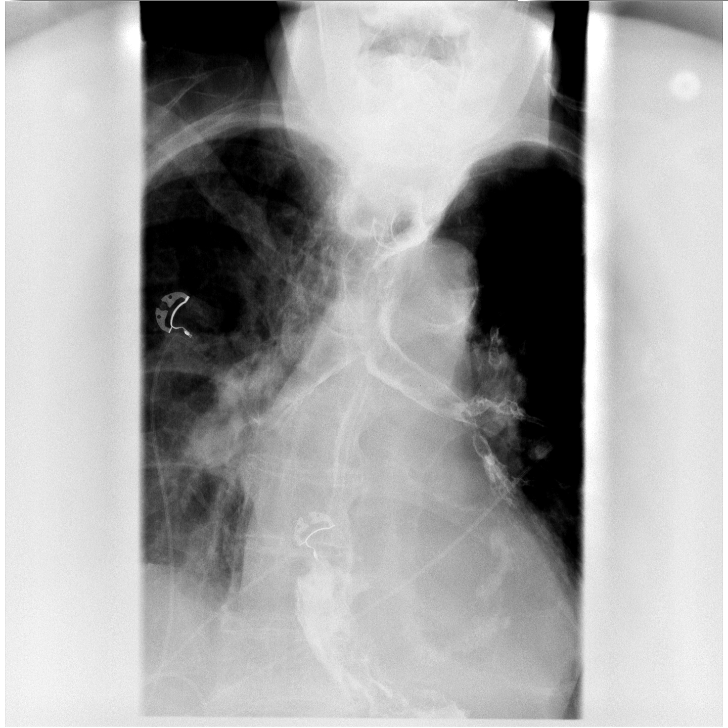

[Series 4: fluoro_barium 2fps_bw · 0.17mm/px · 2 of 2 frames shown (4 of 4)]
[frame 1/2]
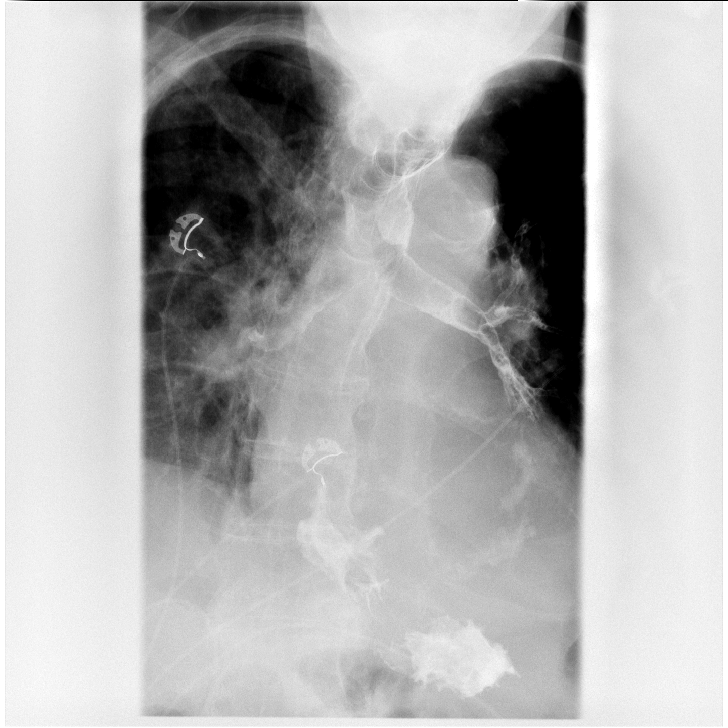
[frame 2/2]
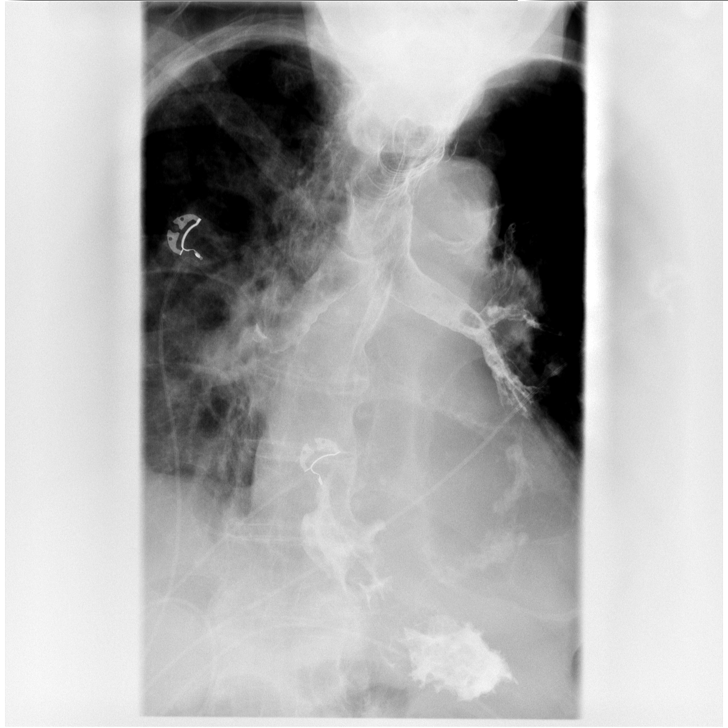

[Series 5: cp_standard · 0.26mm/px · 1 of 1 slices shown]
[im 1/1]
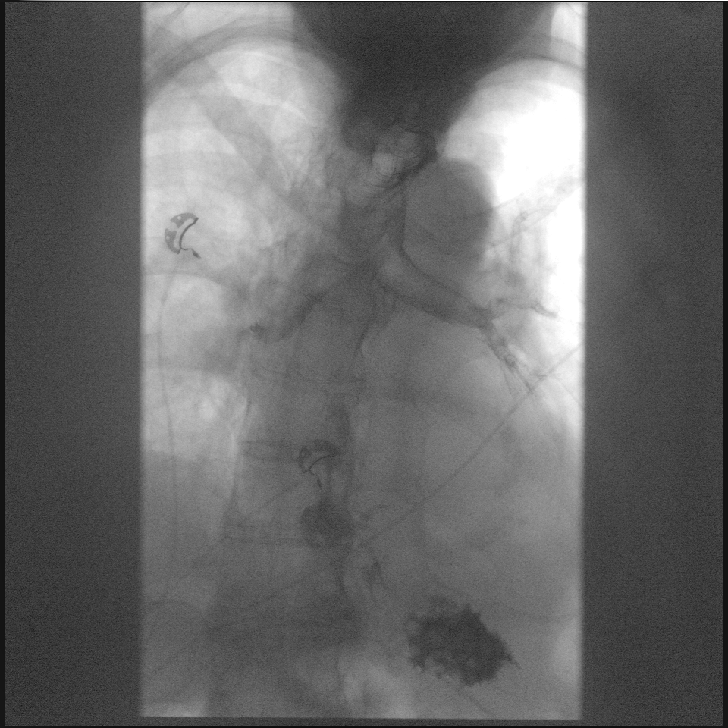

[8 of 8 positions shown; findings below may reference images not displayed]

FINDINGS: The patient is unable to stand. Single-contrast esophagram attempted
with the patient approximately 40 degrees upright. With 2 small
swallows, there was immediate aspiration into the tracheobronchial
tree. Small hiatal hernia also noted. At this point the procedure
was stopped. Images obtained for documentation.
IMPRESSION: Positive exam for immediate aspiration into the tracheobronchial
tree.

Small hiatal hernia

These results were called by telephone at the time of interpretation
on 08/26/2019 at [DATE] to provider MIRSIJENUHAN DEIBL , who verbally
acknowledged these results.
